# Patient Record
Sex: Female | Born: 1945 | Race: White | Hispanic: No | Marital: Married | Smoking: Former smoker
Health system: Southern US, Community
[De-identification: ages and names within clinical notes are randomized; demographics above are authoritative.]

## PROBLEM LIST (undated history)

## (undated) DIAGNOSIS — I10 Essential (primary) hypertension: Secondary | ICD-10-CM

## (undated) DIAGNOSIS — G459 Transient cerebral ischemic attack, unspecified: Secondary | ICD-10-CM

## (undated) DIAGNOSIS — E039 Hypothyroidism, unspecified: Secondary | ICD-10-CM

## (undated) DIAGNOSIS — I9789 Other postprocedural complications and disorders of the circulatory system, not elsewhere classified: Secondary | ICD-10-CM

## (undated) DIAGNOSIS — Z87442 Personal history of urinary calculi: Secondary | ICD-10-CM

## (undated) DIAGNOSIS — I951 Orthostatic hypotension: Secondary | ICD-10-CM

## (undated) DIAGNOSIS — E119 Type 2 diabetes mellitus without complications: Secondary | ICD-10-CM

## (undated) DIAGNOSIS — I4891 Unspecified atrial fibrillation: Secondary | ICD-10-CM

## (undated) DIAGNOSIS — I214 Non-ST elevation (NSTEMI) myocardial infarction: Secondary | ICD-10-CM

## (undated) DIAGNOSIS — I5032 Chronic diastolic (congestive) heart failure: Secondary | ICD-10-CM

## (undated) DIAGNOSIS — I2510 Atherosclerotic heart disease of native coronary artery without angina pectoris: Secondary | ICD-10-CM

## (undated) DIAGNOSIS — E1140 Type 2 diabetes mellitus with diabetic neuropathy, unspecified: Secondary | ICD-10-CM

## (undated) HISTORY — DX: Orthostatic hypotension: I95.1

## (undated) HISTORY — DX: Type 2 diabetes mellitus without complications: E11.9

## (undated) HISTORY — DX: Unspecified atrial fibrillation: I48.91

## (undated) HISTORY — DX: Chronic diastolic (congestive) heart failure: I50.32

## (undated) HISTORY — PX: ABDOMINAL HYSTERECTOMY: SHX81

## (undated) HISTORY — PX: LAPAROSCOPIC GASTRIC BANDING: SHX1100

## (undated) HISTORY — DX: Unspecified atrial fibrillation: I97.89

## (undated) HISTORY — DX: Type 2 diabetes mellitus with diabetic neuropathy, unspecified: E11.40

## (undated) HISTORY — DX: Atherosclerotic heart disease of native coronary artery without angina pectoris: I25.10

---

## 2007-01-01 ENCOUNTER — Ambulatory Visit (HOSPITAL_COMMUNITY): Admission: RE | Admit: 2007-01-01 | Discharge: 2007-01-01 | Payer: Self-pay | Admitting: Surgery

## 2007-01-05 ENCOUNTER — Ambulatory Visit (HOSPITAL_COMMUNITY): Admission: RE | Admit: 2007-01-05 | Discharge: 2007-01-05 | Payer: Self-pay | Admitting: Surgery

## 2007-01-12 ENCOUNTER — Encounter: Admission: RE | Admit: 2007-01-12 | Discharge: 2007-01-12 | Payer: Self-pay | Admitting: Surgery

## 2007-09-01 ENCOUNTER — Encounter: Admission: RE | Admit: 2007-09-01 | Discharge: 2007-09-01 | Payer: Self-pay | Admitting: Surgery

## 2007-09-14 ENCOUNTER — Ambulatory Visit (HOSPITAL_COMMUNITY): Admission: RE | Admit: 2007-09-14 | Discharge: 2007-09-15 | Payer: Self-pay | Admitting: Surgery

## 2010-08-14 NOTE — Op Note (Signed)
NAME:  Theresa Ray, Theresa Ray                 ACCOUNT NO.:  [id number]   MEDICAL RECORD NO.:  [id number]          PATIENT TYPE:  AMB   LOCATION:  DAY                          FACILITY:  [facility]   PHYSICIAN:  Sandria Bales. Ezzard Standing, M.D.  DATE OF BIRTH:  [birth year]   DATE OF PROCEDURE:  09/14/2007  DATE OF DISCHARGE:                               OPERATIVE REPORT   Date of Surgery ??   PREOPERATIVE DIAGNOSIS:  Morbid obesity, with a weight of 268, a body  mass index of 47.6.   POSTOPERATIVE DIAGNOSIS:  Morbid obesity, weight of 268, a body mass  index of 47.6.   PROCEDURE:  Lap band placement, with AP standard band.   SURGEON:  Sandria Bales. Ezzard Standing, M.D.   FIRST ASSISTANT:  Alfonse Ras, M.D.   ANESTHESIA:  General endotracheal.   ESTIMATED BLOOD LOSS:  Minimal.   PROCEDURE:  Theresa Ray is a 66 year old white female who is a patient Dr.  Roney Marion, who has been through our preop bariatric program.  She had  been interested in a lap band, and it was discussed with her the  indications and potential complication of lap band placement.   Potential complications include but are not limited bleeding, infection,  bowel injury, slippage and erosion of the band, and long-term  nutritional consequences.   OPERATIVE NOTE:  The patient was placed in the supine position and given  a general endotracheal anesthetic.  Her abdomen was prepped with Techni-  Care and sterilely draped.   A time-out was held, identifying the patient and procedure.  She was  given 1 g of Ancef preoperatively.  Her abdomen was accessed using a 10  mm/11 mm OptiVu in the left upper quadrant of the abdomen.  After  introducing a trocar into the abdomen, a 10 mm, 0-degree laparoscope was  inserted and the abdomen explored.  The patient had adhesions down  towards her pelvis of omentum.  Her bowel I really could not see well  because it was covered with omentum.  She is more of an apple than a  pear.  Her right and left lobes of  the liver showed fatty infiltration,  but no specific mass or nodule.  Her stomach that I could see was  unremarkable.   I then placed five additional trocars, a 5 mm subxiphoid trocar for the  liver retractor, a 15 mm right subcostal trocar,  11 mm right paramedian  trocar, 11 mm left paramedian trocar, and a 5 mm left lateral trocar.   I first dissected a section along the left side of the gastroesophageal  junction in the angle of his His.  I opened this and used a finger  dissector to go along the left crus.   I then went along the gastrohepatic ligament, identified the right crus,  found the fat decussating across the crus, opened this, made a small  window, and passed the finger dissector behind the proximal stomach.   I then had introduced the AP standard band.  The AP standard band was  then passed around behind the gastroesophageal junction  using a finger  dissector.  A sizer was placed down the esophagus and the band closed  over the sizer, which showed it was not too tight, and then the sizer  removed.   I then imbricated the stomach over the band up to the upper gastric  pouch.  I used 3 sutures of Ethibond suture using a Tie-Knot device to  secure the sutures.  The middle suture was somewhat loose.  The other  two looked good.  She also had a little bit of a hematoma from her  middle suture.   After the band had been sutured in place, photos were taken.  These were  put in the chart.  This showed good position of the band, with no  further bleeding.  The trocars were then removed in turn.  The Silastic  tubing was attached to the reservoir device, and the reservoir was sewn  in place in the right upper quadrant using interrupted 0 Prolene sutures  in four corners.   The wounds were then irrigated.  I used about 48 mL of 1% Xylocaine as a  local anesthetic.  The subcutaneous tissue was closed with a 2-0 Vicryl  suture, and the skin site closed with a 5-0 Monocryl,  painted with  tincture of Benzoin, and Steri-Stripped.   The patient tolerated the procedure well and was transported to the  recovery room in good condition.      Sandria Bales. Ezzard Standing, M.D.  Electronically Signed     DHN/MEDQ  D:  09/14/2007  T:  09/14/2007  Job:  [id number]   cc:   Roney Marion, M.D.

## 2010-12-27 LAB — CBC
Hemoglobin: 13.2
MCHC: 34
WBC: 8.5

## 2010-12-27 LAB — DIFFERENTIAL
Eosinophils Absolute: 0.1
Eosinophils Relative: 2
Monocytes Absolute: 0.6
Monocytes Relative: 8

## 2010-12-27 LAB — BASIC METABOLIC PANEL
CO2: 27
Creatinine, Ser: 0.86
GFR calc Af Amer: >60
GFR calc non Af Amer: >60
Sodium: 139

## 2010-12-27 LAB — PREGNANCY, URINE: Preg Test, Ur: NEGATIVE

## 2010-12-27 LAB — HEMOGLOBIN AND HEMATOCRIT, BLOOD: HCT: 40

## 2011-02-25 ENCOUNTER — Telehealth (INDEPENDENT_AMBULATORY_CARE_PROVIDER_SITE_OTHER): Payer: Self-pay | Admitting: Surgery

## 2011-02-25 NOTE — Telephone Encounter (Signed)
02/25/11 mailed recall to patient for bariatric surgery follow-up. Adv pt to call [facility] to schedule an appt...cef °

## 2012-04-06 ENCOUNTER — Telehealth (INDEPENDENT_AMBULATORY_CARE_PROVIDER_SITE_OTHER): Payer: Self-pay | Admitting: Surgery

## 2012-04-06 NOTE — Telephone Encounter (Signed)
04/06/12 mailed bariatric surgery recall letter to patient. Advised patient to call [phone] to make a bariatric follow-up appointment. (lss) ° °

## 2015-09-12 ENCOUNTER — Other Ambulatory Visit: Payer: Self-pay | Admitting: Family Medicine

## 2016-01-03 ENCOUNTER — Encounter (HOSPITAL_COMMUNITY): Payer: Self-pay

## 2016-04-19 DIAGNOSIS — J40 Bronchitis, not specified as acute or chronic: Secondary | ICD-10-CM | POA: Diagnosis not present

## 2016-05-03 DIAGNOSIS — N390 Urinary tract infection, site not specified: Secondary | ICD-10-CM | POA: Diagnosis not present

## 2016-05-10 DIAGNOSIS — T162XXA Foreign body in left ear, initial encounter: Secondary | ICD-10-CM | POA: Diagnosis not present

## 2016-05-10 DIAGNOSIS — J302 Other seasonal allergic rhinitis: Secondary | ICD-10-CM | POA: Diagnosis not present

## 2016-05-10 DIAGNOSIS — Z8709 Personal history of other diseases of the respiratory system: Secondary | ICD-10-CM | POA: Diagnosis not present

## 2016-05-10 DIAGNOSIS — J40 Bronchitis, not specified as acute or chronic: Secondary | ICD-10-CM | POA: Diagnosis not present

## 2016-05-28 DIAGNOSIS — M549 Dorsalgia, unspecified: Secondary | ICD-10-CM | POA: Diagnosis not present

## 2016-05-28 DIAGNOSIS — M47896 Other spondylosis, lumbar region: Secondary | ICD-10-CM | POA: Diagnosis not present

## 2016-05-28 DIAGNOSIS — M5416 Radiculopathy, lumbar region: Secondary | ICD-10-CM | POA: Diagnosis not present

## 2016-05-28 DIAGNOSIS — M545 Low back pain: Secondary | ICD-10-CM | POA: Diagnosis not present

## 2016-05-28 DIAGNOSIS — M5126 Other intervertebral disc displacement, lumbar region: Secondary | ICD-10-CM | POA: Diagnosis not present

## 2016-06-04 DIAGNOSIS — M47816 Spondylosis without myelopathy or radiculopathy, lumbar region: Secondary | ICD-10-CM | POA: Diagnosis not present

## 2016-06-04 DIAGNOSIS — M48061 Spinal stenosis, lumbar region without neurogenic claudication: Secondary | ICD-10-CM | POA: Diagnosis not present

## 2016-06-04 DIAGNOSIS — M5126 Other intervertebral disc displacement, lumbar region: Secondary | ICD-10-CM | POA: Diagnosis not present

## 2016-06-21 DIAGNOSIS — Z6839 Body mass index (BMI) 39.0-39.9, adult: Secondary | ICD-10-CM | POA: Diagnosis not present

## 2016-06-21 DIAGNOSIS — R32 Unspecified urinary incontinence: Secondary | ICD-10-CM | POA: Diagnosis not present

## 2016-06-21 DIAGNOSIS — N390 Urinary tract infection, site not specified: Secondary | ICD-10-CM | POA: Diagnosis not present

## 2016-06-24 DIAGNOSIS — N3000 Acute cystitis without hematuria: Secondary | ICD-10-CM | POA: Diagnosis not present

## 2016-06-24 DIAGNOSIS — N390 Urinary tract infection, site not specified: Secondary | ICD-10-CM | POA: Diagnosis not present

## 2016-06-26 DIAGNOSIS — Z6838 Body mass index (BMI) 38.0-38.9, adult: Secondary | ICD-10-CM | POA: Diagnosis not present

## 2016-06-26 DIAGNOSIS — M545 Low back pain: Secondary | ICD-10-CM | POA: Diagnosis not present

## 2016-06-26 DIAGNOSIS — M5126 Other intervertebral disc displacement, lumbar region: Secondary | ICD-10-CM | POA: Diagnosis not present

## 2016-06-27 DIAGNOSIS — N318 Other neuromuscular dysfunction of bladder: Secondary | ICD-10-CM | POA: Diagnosis not present

## 2016-06-27 DIAGNOSIS — N3000 Acute cystitis without hematuria: Secondary | ICD-10-CM | POA: Diagnosis not present

## 2016-06-27 DIAGNOSIS — N393 Stress incontinence (female) (male): Secondary | ICD-10-CM | POA: Diagnosis not present

## 2016-07-01 DIAGNOSIS — N393 Stress incontinence (female) (male): Secondary | ICD-10-CM | POA: Diagnosis not present

## 2016-07-08 DIAGNOSIS — M47816 Spondylosis without myelopathy or radiculopathy, lumbar region: Secondary | ICD-10-CM | POA: Diagnosis not present

## 2016-07-15 DIAGNOSIS — N393 Stress incontinence (female) (male): Secondary | ICD-10-CM | POA: Diagnosis not present

## 2016-07-15 DIAGNOSIS — N3000 Acute cystitis without hematuria: Secondary | ICD-10-CM | POA: Diagnosis not present

## 2016-07-16 DIAGNOSIS — M47816 Spondylosis without myelopathy or radiculopathy, lumbar region: Secondary | ICD-10-CM | POA: Diagnosis not present

## 2016-08-13 DIAGNOSIS — N318 Other neuromuscular dysfunction of bladder: Secondary | ICD-10-CM | POA: Diagnosis not present

## 2016-08-13 DIAGNOSIS — N393 Stress incontinence (female) (male): Secondary | ICD-10-CM | POA: Diagnosis not present

## 2016-08-13 DIAGNOSIS — N3020 Other chronic cystitis without hematuria: Secondary | ICD-10-CM | POA: Diagnosis not present

## 2016-09-03 DIAGNOSIS — L02415 Cutaneous abscess of right lower limb: Secondary | ICD-10-CM | POA: Diagnosis not present

## 2016-09-04 DIAGNOSIS — M47816 Spondylosis without myelopathy or radiculopathy, lumbar region: Secondary | ICD-10-CM | POA: Diagnosis not present

## 2016-09-04 DIAGNOSIS — M47817 Spondylosis without myelopathy or radiculopathy, lumbosacral region: Secondary | ICD-10-CM | POA: Diagnosis not present

## 2016-09-16 DIAGNOSIS — N393 Stress incontinence (female) (male): Secondary | ICD-10-CM | POA: Diagnosis not present

## 2016-09-16 DIAGNOSIS — N3020 Other chronic cystitis without hematuria: Secondary | ICD-10-CM | POA: Diagnosis not present

## 2016-10-03 DIAGNOSIS — M5126 Other intervertebral disc displacement, lumbar region: Secondary | ICD-10-CM | POA: Diagnosis not present

## 2016-10-03 DIAGNOSIS — M545 Low back pain: Secondary | ICD-10-CM | POA: Diagnosis not present

## 2016-10-03 DIAGNOSIS — M47816 Spondylosis without myelopathy or radiculopathy, lumbar region: Secondary | ICD-10-CM | POA: Diagnosis not present

## 2016-11-05 DIAGNOSIS — Z131 Encounter for screening for diabetes mellitus: Secondary | ICD-10-CM | POA: Diagnosis not present

## 2016-11-05 DIAGNOSIS — Z1389 Encounter for screening for other disorder: Secondary | ICD-10-CM | POA: Diagnosis not present

## 2016-11-05 DIAGNOSIS — E785 Hyperlipidemia, unspecified: Secondary | ICD-10-CM | POA: Diagnosis not present

## 2016-11-05 DIAGNOSIS — E039 Hypothyroidism, unspecified: Secondary | ICD-10-CM | POA: Diagnosis not present

## 2016-11-05 DIAGNOSIS — Z79899 Other long term (current) drug therapy: Secondary | ICD-10-CM | POA: Diagnosis not present

## 2016-11-05 DIAGNOSIS — Z0000 Encounter for general adult medical examination without abnormal findings: Secondary | ICD-10-CM | POA: Diagnosis not present

## 2016-11-05 DIAGNOSIS — Z6837 Body mass index (BMI) 37.0-37.9, adult: Secondary | ICD-10-CM | POA: Diagnosis not present

## 2017-02-14 DIAGNOSIS — L259 Unspecified contact dermatitis, unspecified cause: Secondary | ICD-10-CM | POA: Diagnosis not present

## 2017-02-15 DIAGNOSIS — E785 Hyperlipidemia, unspecified: Secondary | ICD-10-CM | POA: Diagnosis not present

## 2017-02-15 DIAGNOSIS — E1165 Type 2 diabetes mellitus with hyperglycemia: Secondary | ICD-10-CM | POA: Diagnosis not present

## 2017-02-15 DIAGNOSIS — R9431 Abnormal electrocardiogram [ECG] [EKG]: Secondary | ICD-10-CM | POA: Diagnosis not present

## 2017-02-15 DIAGNOSIS — I10 Essential (primary) hypertension: Secondary | ICD-10-CM | POA: Diagnosis not present

## 2017-02-15 DIAGNOSIS — M542 Cervicalgia: Secondary | ICD-10-CM | POA: Diagnosis not present

## 2017-02-15 DIAGNOSIS — E039 Hypothyroidism, unspecified: Secondary | ICD-10-CM | POA: Diagnosis not present

## 2017-02-15 DIAGNOSIS — R079 Chest pain, unspecified: Secondary | ICD-10-CM | POA: Diagnosis not present

## 2017-02-15 DIAGNOSIS — R200 Anesthesia of skin: Secondary | ICD-10-CM | POA: Diagnosis not present

## 2017-02-15 DIAGNOSIS — I214 Non-ST elevation (NSTEMI) myocardial infarction: Secondary | ICD-10-CM | POA: Diagnosis not present

## 2017-02-15 DIAGNOSIS — Z8673 Personal history of transient ischemic attack (TIA), and cerebral infarction without residual deficits: Secondary | ICD-10-CM | POA: Diagnosis not present

## 2017-02-15 DIAGNOSIS — Z7984 Long term (current) use of oral hypoglycemic drugs: Secondary | ICD-10-CM | POA: Diagnosis not present

## 2017-02-15 DIAGNOSIS — R7989 Other specified abnormal findings of blood chemistry: Secondary | ICD-10-CM | POA: Diagnosis not present

## 2017-02-15 DIAGNOSIS — R937 Abnormal findings on diagnostic imaging of other parts of musculoskeletal system: Secondary | ICD-10-CM | POA: Diagnosis not present

## 2017-02-15 DIAGNOSIS — R0602 Shortness of breath: Secondary | ICD-10-CM | POA: Diagnosis not present

## 2017-02-16 ENCOUNTER — Inpatient Hospital Stay (HOSPITAL_COMMUNITY)
Admission: AD | Admit: 2017-02-16 | Discharge: 2017-03-03 | DRG: 233 | Disposition: A | Discharge status: Home Health | Payer: Medicare HMO | Source: Other Acute Inpatient Hospital | Attending: Cardiothoracic Surgery | Admitting: Cardiothoracic Surgery

## 2017-02-16 ENCOUNTER — Telehealth: Payer: Self-pay | Admitting: Cardiology

## 2017-02-16 ENCOUNTER — Inpatient Hospital Stay (HOSPITAL_COMMUNITY): Payer: Medicare HMO

## 2017-02-16 DIAGNOSIS — Z9884 Bariatric surgery status: Secondary | ICD-10-CM | POA: Diagnosis not present

## 2017-02-16 DIAGNOSIS — Z79899 Other long term (current) drug therapy: Secondary | ICD-10-CM | POA: Diagnosis not present

## 2017-02-16 DIAGNOSIS — E781 Pure hyperglyceridemia: Secondary | ICD-10-CM | POA: Diagnosis present

## 2017-02-16 DIAGNOSIS — Z7984 Long term (current) use of oral hypoglycemic drugs: Secondary | ICD-10-CM

## 2017-02-16 DIAGNOSIS — D72829 Elevated white blood cell count, unspecified: Secondary | ICD-10-CM | POA: Diagnosis not present

## 2017-02-16 DIAGNOSIS — J9811 Atelectasis: Secondary | ICD-10-CM | POA: Diagnosis not present

## 2017-02-16 DIAGNOSIS — Z91013 Allergy to seafood: Secondary | ICD-10-CM | POA: Diagnosis not present

## 2017-02-16 DIAGNOSIS — I2510 Atherosclerotic heart disease of native coronary artery without angina pectoris: Secondary | ICD-10-CM | POA: Diagnosis present

## 2017-02-16 DIAGNOSIS — Z8673 Personal history of transient ischemic attack (TIA), and cerebral infarction without residual deficits: Secondary | ICD-10-CM

## 2017-02-16 DIAGNOSIS — Z6841 Body Mass Index (BMI) 40.0 and over, adult: Secondary | ICD-10-CM | POA: Diagnosis not present

## 2017-02-16 DIAGNOSIS — Z4651 Encounter for fitting and adjustment of gastric lap band: Secondary | ICD-10-CM | POA: Diagnosis not present

## 2017-02-16 DIAGNOSIS — E039 Hypothyroidism, unspecified: Secondary | ICD-10-CM | POA: Diagnosis present

## 2017-02-16 DIAGNOSIS — I25110 Atherosclerotic heart disease of native coronary artery with unstable angina pectoris: Secondary | ICD-10-CM | POA: Diagnosis not present

## 2017-02-16 DIAGNOSIS — R937 Abnormal findings on diagnostic imaging of other parts of musculoskeletal system: Secondary | ICD-10-CM | POA: Diagnosis not present

## 2017-02-16 DIAGNOSIS — E119 Type 2 diabetes mellitus without complications: Secondary | ICD-10-CM | POA: Diagnosis not present

## 2017-02-16 DIAGNOSIS — Z79890 Hormone replacement therapy (postmenopausal): Secondary | ICD-10-CM | POA: Diagnosis not present

## 2017-02-16 DIAGNOSIS — Z7982 Long term (current) use of aspirin: Secondary | ICD-10-CM

## 2017-02-16 DIAGNOSIS — M1990 Unspecified osteoarthritis, unspecified site: Secondary | ICD-10-CM | POA: Diagnosis present

## 2017-02-16 DIAGNOSIS — E1165 Type 2 diabetes mellitus with hyperglycemia: Secondary | ICD-10-CM | POA: Diagnosis not present

## 2017-02-16 DIAGNOSIS — Z87891 Personal history of nicotine dependence: Secondary | ICD-10-CM | POA: Diagnosis not present

## 2017-02-16 DIAGNOSIS — E785 Hyperlipidemia, unspecified: Secondary | ICD-10-CM | POA: Diagnosis not present

## 2017-02-16 DIAGNOSIS — Z951 Presence of aortocoronary bypass graft: Secondary | ICD-10-CM

## 2017-02-16 DIAGNOSIS — I081 Rheumatic disorders of both mitral and tricuspid valves: Secondary | ICD-10-CM | POA: Diagnosis not present

## 2017-02-16 DIAGNOSIS — I110 Hypertensive heart disease with heart failure: Secondary | ICD-10-CM | POA: Diagnosis present

## 2017-02-16 DIAGNOSIS — J90 Pleural effusion, not elsewhere classified: Secondary | ICD-10-CM | POA: Diagnosis not present

## 2017-02-16 DIAGNOSIS — R200 Anesthesia of skin: Secondary | ICD-10-CM | POA: Diagnosis not present

## 2017-02-16 DIAGNOSIS — G4733 Obstructive sleep apnea (adult) (pediatric): Secondary | ICD-10-CM | POA: Diagnosis present

## 2017-02-16 DIAGNOSIS — E876 Hypokalemia: Secondary | ICD-10-CM | POA: Diagnosis not present

## 2017-02-16 DIAGNOSIS — E1140 Type 2 diabetes mellitus with diabetic neuropathy, unspecified: Secondary | ICD-10-CM

## 2017-02-16 DIAGNOSIS — Z9111 Patient's noncompliance with dietary regimen: Secondary | ICD-10-CM | POA: Diagnosis not present

## 2017-02-16 DIAGNOSIS — Z01810 Encounter for preprocedural cardiovascular examination: Secondary | ICD-10-CM | POA: Diagnosis not present

## 2017-02-16 DIAGNOSIS — D62 Acute posthemorrhagic anemia: Secondary | ICD-10-CM | POA: Diagnosis not present

## 2017-02-16 DIAGNOSIS — Z4682 Encounter for fitting and adjustment of non-vascular catheter: Secondary | ICD-10-CM | POA: Diagnosis not present

## 2017-02-16 DIAGNOSIS — R9431 Abnormal electrocardiogram [ECG] [EKG]: Secondary | ICD-10-CM | POA: Diagnosis not present

## 2017-02-16 DIAGNOSIS — R7989 Other specified abnormal findings of blood chemistry: Secondary | ICD-10-CM | POA: Diagnosis not present

## 2017-02-16 DIAGNOSIS — I5030 Unspecified diastolic (congestive) heart failure: Secondary | ICD-10-CM | POA: Diagnosis not present

## 2017-02-16 DIAGNOSIS — Z9689 Presence of other specified functional implants: Secondary | ICD-10-CM

## 2017-02-16 DIAGNOSIS — I5031 Acute diastolic (congestive) heart failure: Secondary | ICD-10-CM | POA: Diagnosis not present

## 2017-02-16 DIAGNOSIS — K5900 Constipation, unspecified: Secondary | ICD-10-CM | POA: Diagnosis not present

## 2017-02-16 DIAGNOSIS — I214 Non-ST elevation (NSTEMI) myocardial infarction: Principal | ICD-10-CM | POA: Diagnosis present

## 2017-02-16 DIAGNOSIS — I480 Paroxysmal atrial fibrillation: Secondary | ICD-10-CM | POA: Diagnosis not present

## 2017-02-16 DIAGNOSIS — Z09 Encounter for follow-up examination after completed treatment for conditions other than malignant neoplasm: Secondary | ICD-10-CM

## 2017-02-16 DIAGNOSIS — L8990 Pressure ulcer of unspecified site, unspecified stage: Secondary | ICD-10-CM

## 2017-02-16 DIAGNOSIS — J939 Pneumothorax, unspecified: Secondary | ICD-10-CM

## 2017-02-16 DIAGNOSIS — I10 Essential (primary) hypertension: Secondary | ICD-10-CM

## 2017-02-16 DIAGNOSIS — R079 Chest pain, unspecified: Secondary | ICD-10-CM | POA: Diagnosis not present

## 2017-02-16 DIAGNOSIS — R918 Other nonspecific abnormal finding of lung field: Secondary | ICD-10-CM | POA: Diagnosis not present

## 2017-02-16 HISTORY — DX: Essential (primary) hypertension: I10

## 2017-02-16 HISTORY — DX: Personal history of urinary calculi: Z87.442

## 2017-02-16 HISTORY — DX: Transient cerebral ischemic attack, unspecified: G45.9

## 2017-02-16 HISTORY — DX: Non-ST elevation (NSTEMI) myocardial infarction: I21.4

## 2017-02-16 HISTORY — DX: Hypothyroidism, unspecified: E03.9

## 2017-02-16 LAB — BASIC METABOLIC PANEL
ANION GAP: 13 (ref 5–15)
BUN: 13 mg/dL (ref 6–20)
CO2: 22 mmol/L (ref 22–32)
Calcium: 8.5 mg/dL — ABNORMAL LOW (ref 8.9–10.3)
Chloride: 101 mmol/L (ref 101–111)
Creatinine, Ser: 0.91 mg/dL (ref 0.44–1.00)
GFR calc Af Amer: >60 mL/min (ref >60)
GFR calc non Af Amer: >60 mL/min (ref >60)
GLUCOSE: 181 mg/dL — AB (ref 65–99)
POTASSIUM: 3.7 mmol/L (ref 3.5–5.1)
Sodium: 136 mmol/L (ref 135–145)

## 2017-02-16 LAB — CBC WITH DIFFERENTIAL/PLATELET
BASOS ABS: 0.1 10*3/uL (ref 0.0–0.1)
BASOS PCT: 1 %
Eosinophils Absolute: 0.2 10*3/uL (ref 0.0–0.7)
Eosinophils Relative: 2 %
HEMATOCRIT: 35.8 % — AB (ref 36.0–46.0)
HEMOGLOBIN: 12.2 g/dL (ref 12.0–15.0)
Lymphocytes Relative: 28 %
Lymphs Abs: 3.5 10*3/uL (ref 0.7–4.0)
MCH: 30.4 pg (ref 26.0–34.0)
MCHC: 34.1 g/dL (ref 30.0–36.0)
MCV: 89.3 fL (ref 78.0–100.0)
Monocytes Absolute: 1 10*3/uL (ref 0.1–1.0)
Monocytes Relative: 8 %
NEUTROS ABS: 7.9 10*3/uL — AB (ref 1.7–7.7)
NEUTROS PCT: 61 %
Platelets: 356 10*3/uL (ref 150–400)
RBC: 4.01 MIL/uL (ref 3.87–5.11)
RDW: 12.3 % (ref 11.5–15.5)
WBC: 12.7 10*3/uL — ABNORMAL HIGH (ref 4.0–10.5)

## 2017-02-16 LAB — HEPARIN LEVEL (UNFRACTIONATED)

## 2017-02-16 LAB — GLUCOSE, CAPILLARY
Glucose-Capillary: 181 mg/dL — ABNORMAL HIGH (ref 65–99)
Glucose-Capillary: 288 mg/dL — ABNORMAL HIGH (ref 65–99)

## 2017-02-16 LAB — HEMOGLOBIN A1C
HEMOGLOBIN A1C: 8.8 % — AB (ref 4.8–5.6)
MEAN PLASMA GLUCOSE: 205.86 mg/dL

## 2017-02-16 LAB — PROTIME-INR
INR: 0.98
Prothrombin Time: 12.9 seconds (ref 11.4–15.2)

## 2017-02-16 LAB — TROPONIN I: Troponin I: 1.19 ng/mL (ref <0.03)

## 2017-02-16 LAB — APTT: APTT: 39 s — AB (ref 24–36)

## 2017-02-16 LAB — MAGNESIUM: Magnesium: 1.7 mg/dL (ref 1.7–2.4)

## 2017-02-16 LAB — MRSA PCR SCREENING: MRSA by PCR: NEGATIVE

## 2017-02-16 MED ORDER — CARVEDILOL 25 MG PO TABS
25.0000 mg | ORAL_TABLET | Freq: Two times a day (BID) | ORAL | Status: DC
  Administered 2017-02-16 – 2017-02-23 (×15): 25 mg via ORAL
  Filled 2017-02-16 (×15): qty 1

## 2017-02-16 MED ORDER — NON FORMULARY: 3.0000 mg | Freq: Every evening | Status: DC | PRN

## 2017-02-16 MED ORDER — METOPROLOL TARTRATE 12.5 MG HALF TABLET: 12.5000 mg | ORAL_TABLET | Freq: Two times a day (BID) | ORAL | Status: DC

## 2017-02-16 MED ORDER — GABAPENTIN 100 MG PO CAPS
100.0000 mg | ORAL_CAPSULE | Freq: Three times a day (TID) | ORAL | Status: DC
  Administered 2017-02-16 – 2017-03-03 (×42): 100 mg via ORAL
  Filled 2017-02-16 (×42): qty 1

## 2017-02-16 MED ORDER — MELATONIN 3 MG PO TABS
3.0000 mg | ORAL_TABLET | Freq: Every evening | ORAL | Status: DC | PRN
  Administered 2017-02-16 – 2017-02-28 (×6): 3 mg via ORAL
  Filled 2017-02-16 (×10): qty 1

## 2017-02-16 MED ORDER — HEPARIN (PORCINE) IN NACL 100-0.45 UNIT/ML-% IJ SOLN
1750.0000 [IU]/h | INTRAMUSCULAR | Status: DC
  Administered 2017-02-17: 1450 [IU]/h via INTRAVENOUS
  Administered 2017-02-17: 1750 [IU]/h via INTRAVENOUS
  Filled 2017-02-16 (×2): qty 250

## 2017-02-16 MED ORDER — INSULIN ASPART 100 UNIT/ML ~~LOC~~ SOLN
0.0000 [IU] | Freq: Three times a day (TID) | SUBCUTANEOUS | Status: DC
  Administered 2017-02-17: 5 [IU] via SUBCUTANEOUS
  Administered 2017-02-17: 3 [IU] via SUBCUTANEOUS
  Administered 2017-02-17: 5 [IU] via SUBCUTANEOUS
  Administered 2017-02-18 (×2): 3 [IU] via SUBCUTANEOUS
  Administered 2017-02-18: 7 [IU] via SUBCUTANEOUS
  Administered 2017-02-19 (×2): 5 [IU] via SUBCUTANEOUS
  Administered 2017-02-19: 3 [IU] via SUBCUTANEOUS
  Administered 2017-02-20 (×2): 5 [IU] via SUBCUTANEOUS
  Administered 2017-02-20 – 2017-02-21 (×3): 3 [IU] via SUBCUTANEOUS
  Administered 2017-02-21 – 2017-02-22 (×2): 2 [IU] via SUBCUTANEOUS
  Administered 2017-02-22: 3 [IU] via SUBCUTANEOUS
  Administered 2017-02-22: 5 [IU] via SUBCUTANEOUS
  Administered 2017-02-23 (×3): 3 [IU] via SUBCUTANEOUS

## 2017-02-16 MED ORDER — NITROGLYCERIN IN D5W 200-5 MCG/ML-% IV SOLN: 0.0000 ug/min | INTRAVENOUS | Status: DC

## 2017-02-16 MED ORDER — ASPIRIN EC 81 MG PO TBEC
81.0000 mg | DELAYED_RELEASE_TABLET | Freq: Every day | ORAL | Status: DC
  Administered 2017-02-17 – 2017-02-23 (×7): 81 mg via ORAL
  Filled 2017-02-16 (×7): qty 1

## 2017-02-16 MED ORDER — LEVOTHYROXINE SODIUM 75 MCG PO TABS
150.0000 ug | ORAL_TABLET | Freq: Every day | ORAL | Status: DC
  Administered 2017-02-17 – 2017-03-03 (×14): 150 ug via ORAL
  Filled 2017-02-16 (×3): qty 2
  Filled 2017-02-16: qty 6
  Filled 2017-02-16 (×6): qty 2
  Filled 2017-02-16: qty 6
  Filled 2017-02-16 (×4): qty 2

## 2017-02-16 MED ORDER — CLONIDINE HCL 0.2 MG PO TABS
0.2000 mg | ORAL_TABLET | Freq: Every day | ORAL | Status: DC
  Administered 2017-02-16 – 2017-02-23 (×8): 0.2 mg via ORAL
  Filled 2017-02-16 (×8): qty 1

## 2017-02-16 MED ORDER — AMLODIPINE BESYLATE 10 MG PO TABS
10.0000 mg | ORAL_TABLET | Freq: Every day | ORAL | Status: DC
  Administered 2017-02-17 – 2017-02-23 (×7): 10 mg via ORAL
  Filled 2017-02-16 (×7): qty 1

## 2017-02-16 MED ORDER — ONDANSETRON HCL 4 MG/2ML IJ SOLN: 4.0000 mg | Freq: Four times a day (QID) | INTRAMUSCULAR | Status: DC | PRN

## 2017-02-16 MED ORDER — NITROGLYCERIN 0.4 MG SL SUBL: 0.4000 mg | SUBLINGUAL_TABLET | SUBLINGUAL | Status: DC | PRN

## 2017-02-16 MED ORDER — INSULIN ASPART 100 UNIT/ML ~~LOC~~ SOLN
0.0000 [IU] | Freq: Every day | SUBCUTANEOUS | Status: DC
  Administered 2017-02-16 – 2017-02-17 (×2): 3 [IU] via SUBCUTANEOUS
  Administered 2017-02-18 – 2017-02-19 (×2): 2 [IU] via SUBCUTANEOUS
  Administered 2017-02-20: 3 [IU] via SUBCUTANEOUS
  Administered 2017-02-21: 2 [IU] via SUBCUTANEOUS
  Administered 2017-02-22: 3 [IU] via SUBCUTANEOUS
  Administered 2017-02-23: 2 [IU] via SUBCUTANEOUS

## 2017-02-16 MED ORDER — ACETAMINOPHEN 325 MG PO TABS
650.0000 mg | ORAL_TABLET | ORAL | Status: DC | PRN
  Administered 2017-02-16 – 2017-02-22 (×10): 650 mg via ORAL
  Filled 2017-02-16 (×10): qty 2

## 2017-02-16 MED ORDER — ATORVASTATIN CALCIUM 80 MG PO TABS
80.0000 mg | ORAL_TABLET | Freq: Every day | ORAL | Status: DC
  Administered 2017-02-16 – 2017-03-02 (×14): 80 mg via ORAL
  Filled 2017-02-16 (×14): qty 1

## 2017-02-16 NOTE — Progress Notes (Signed)
ANTICOAGULATION CONSULT NOTE - Initial Consult  Pharmacy Consult for heparin Indication: chest pain/ACS  Allergies  Allergen Reactions  . Aggrenox [Aspirin-Dipyridamole Er] Other (See Comments)    Redness in eyes, takes aspirin 325 at home at baseline  . Shellfish Allergy Other (See Comments)    UNKNOWN REACTION. Allergy found on allergy test.    Patient Measurements: Height: 5' (152.4 cm) Weight: 214 lb (97.1 kg) IBW/kg (Calculated) : 45.5 Heparin Dosing Weight: 68.9kg  Vital Signs: Temp: 98.1 F (36.7 C) (11/18 1955) Temp Source: Oral (11/18 1700) BP: 141/76 (11/18 2010)  Labs: Recent Labs    02/16/17 1902  HGB 12.2  HCT 35.8*  PLT 356  APTT 39*  LABPROT 12.9  INR 0.98  HEPARINUNFRC <0.10*  CREATININE 0.91  TROPONINI 1.19*    Estimated Creatinine Clearance: 61.7 mL/min (by C-G formula based on SCr of 0.91 mg/dL).   Medical History: No past medical history on file.  Medications:  Infusions:  . heparin      Assessment: 59 yof presented to OSH with CP. Transferred here for possible cardiac cath. Hgb 12.6 and platelets are 357 per OSH. Initially started on 1000 units/hr but changed to 1300 units/hr at some point.   Heparin level this evening resulted as SUBtherapeutic (HL<0.1). It appears the heparin drip was started around 1 PM however it is unclear when the rate was changed to from 1000 units/hr to 1300 units/hr. It is likely that the patient has not been running at this new rate for a full 6 hours. Will increase the drip rate this evening but not as aggressively.    Goal of Therapy:  Heparin level 0.3-0.7 units/ml Monitor platelets by anticoagulation protocol: Yes   Plan:  1. Increase Heparin drip rate to 1450 units/hr (14.5 ml/hr) 2. Will continue to monitor for any signs/symptoms of bleeding and will follow up with heparin level in 6 hours   Thank you for allowing pharmacy to be a part of this patient's care.  Alycia Rossetti, PharmD,  BCPS Clinical Pharmacist Pager: [phone] If after 3:30p, please call main pharmacy at: [phone] 02/16/2017 9:18 PM

## 2017-02-16 NOTE — H&P (Signed)
CARDIOLOGY HISTORY & PHYSICAL   Referring Physician: ER at [facility] Primary Physician: Dr. Kennith Maes [facility]) Primary Cardiologist: NA Reason for Admission: NSTEMI   HPI: Ms. Governale is a 71 yo woman with PMH TIA, DM, arthritis, hypertension who presented to [facility] ER yesterday with chest pain. She was found to have a positive troponin at 0.65 per notes from Dr. Rhae Hammock and was transferred to [facility] for NSTEMI management.  The patient reports no prior history of heart disease. She notes "spells" over the last few months when she just didn't feel well. Not related to exertion, could happen at any time, resolved spontaneously. Yesterday she awoke from sleep with a tightness in her chest. It resolved spontaneously but then recurred at 11 AM and again at 4 PM. It was a tight pressure, in the center of her chest, with arm tingling as well. No associated nausea, vomiting, diaphoresis, shortness of breath. She presented to the [facility] ER yesterday afternoon around 5 PM, and she has not had any pain since. She was started on a heparin and nitroglycerin drip at [place].  She denies current OSA but does state she used CPAP previously, then was told she no longer needed it. Reports prior TIA but denies history of atrial arrhythmia. On full dose ASA at home, not on other blood thinner, no issues with bruising or bleeding. Reports that when she was given aggrenox after her TIA she had "redness in my eyes like pinkeye." Denies other allergies. Has chronic pedal edema, uses furosemide and wears compression stockings. Rest of ROS negative.  Home medications per patient list: Aspirin 325 mg Carvedilol 25 mg twice daily Clonidine 0.2 mg at night Furosemide 40 mg daily Celecoxib 100 mg daily Levothyroxine 150 mcg daily Metformin 1000 mg twice daily Glipizide 10 mg twice daily Atorvastatin 20 mg daily Amlodipine 10 mg daily januvia 50 mg daily Gabapentin 100 mg three times daily Centrum  vitamin Potassium gluconate 595 mg per tab, 3 tabs AM/3 tabs PM Krill oil Coenzyme Q10 Melatonin 10 mg nightly Vitamin D 2000 IU per tab, 3 tabs AM/3 tabs PM Extra strength tylenol PRN  Review of Systems:     Cardiac Review of Systems: {Y] = yes [ ]  = no  Chest Pain [ Y   ]  Resting SOB [ N ] Exertional SOB  [ N ]  Orthopnea [ N ]   Pedal Edema [  Y chronic ]    Palpitations [ N ] Syncope  [ N ]   Presyncope [ N  ]  General Review of Systems: [Y] = yes [  ]=no Constitional: recent weight change [  ]; anorexia [  ]; fatigue [  ]; nausea [  ]; night sweats [  ]; fever [  ]; or chills [  ];                                                                     Eyes : blurred vision [  ]; diplopia [   ]; vision changes [  ];  Amaurosis fugax[  ]; Resp: cough [  ];  wheezing[  ];  hemoptysis[  ];  PND [  ];  GI:  gallstones[  ], vomiting[  ];  dysphagia[  ];  melena[  ];  hematochezia [  ]; heartburn[  ];   GU: kidney stones [  ]; hematuria[  ];   dysuria [  ];  nocturia[  ]; incontinence [  ];             Skin: rash, swelling[  ];, hair loss[  ];  peripheral edema[ Y chronic ];  or itching[  ]; Musculosketetal: myalgias[  ];  joint swelling[  ];  joint erythema[  ];  joint pain[Y chronic  ];  back pain[  ];  Heme/Lymph: bruising[  ];  bleeding[  ];  anemia[  ];  Neuro: TIA[  ];  headaches[  ];  stroke[  ];  vertigo[  ];  seizures[  ];   paresthesias[  ];  difficulty walking[  ];  Psych:depression[  ]; anxiety[  ];  Endocrine: diabetes[  ];  thyroid dysfunction[  ];  Other:  No past medical history on file.  Medications Prior to Admission  Medication Sig Dispense Refill  . amLODipine (NORVASC) 10 MG tablet Take 10 mg daily by mouth.    Marland Kitchen atorvastatin (LIPITOR) 20 MG tablet Take 20 mg daily by mouth.    . carvedilol (COREG) 25 MG tablet Take 25 mg 2 (two) times daily by mouth.    . cloNIDine (CATAPRES) 0.2 MG tablet Take 0.2 mg daily by mouth.    . furosemide (LASIX) 40 MG tablet Take 40 mg  daily by mouth.    Marland Kitchen glipiZIDE (GLUCOTROL) 10 MG tablet Take 10 mg 2 (two) times daily by mouth.    . levothyroxine (SYNTHROID, LEVOTHROID) 175 MCG tablet Take 175 mcg daily before breakfast by mouth.    . metFORMIN (GLUCOPHAGE) 1000 MG tablet Take 1,000 mg 2 (two) times daily by mouth.    . methylPREDNISolone (MEDROL DOSEPAK) 4 MG TBPK tablet See admin instructions. Started on 02-14-17  0  . betamethasone valerate (VALISONE) 0.1 % cream Apply 1 application 2 (two) times daily topically.  0     . [START ON 02/17/2017] amLODipine  10 mg Oral Daily  . [START ON 02/17/2017] aspirin EC  81 mg Oral Daily  . atorvastatin  80 mg Oral q1800  . carvedilol  25 mg Oral BID WC  . cloNIDine  0.2 mg Oral QHS  . gabapentin  100 mg Oral TID  . insulin aspart  0-5 Units Subcutaneous QHS  . [START ON 02/17/2017] insulin aspart  0-9 Units Subcutaneous TID WC  . [START ON 02/17/2017] levothyroxine  150 mcg Oral QAC breakfast    Infusions: . heparin      Allergies  Allergen Reactions  . Aggrenox [Aspirin-Dipyridamole Er] Other (See Comments)    Redness in eyes, takes aspirin 325 at home at baseline  . Shellfish Allergy Other (See Comments)    UNKNOWN REACTION. Allergy found on allergy test.    Social History   Socioeconomic History  . Marital status: Single    Spouse name: Not on file  . Number of children: Not on file  . Years of education: Not on file  . Highest education level: Not on file  Social Needs  . Financial resource strain: Not on file  . Food insecurity - worry: Not on file  . Food insecurity - inability: Not on file  . Transportation needs - medical: Not on file  . Transportation needs - non-medical: Not on file  Occupational History  . Not on file  Tobacco Use  . Smoking status: Not on file  Substance  and Sexual Activity  . Alcohol use: Not on file  . Drug use: Not on file  . Sexual activity: Not on file  Other Topics Concern  . Not on file  Social History Narrative   . Not on file   Mother had history of MI/CABG. Denies other history of heart disease. No family history on file.  PHYSICAL EXAM: Vitals:   02/16/17 1955 02/16/17 2010  BP: 133/68 (!) 141/76  Resp: (!) 21 17  Temp: 98.1 F (36.7 C)      Intake/Output Summary (Last 24 hours) at 02/16/2017 2038 Last data filed at 02/16/2017 2000 Gross per 24 hour  Intake 27.5 ml  Output -  Net 27.5 ml    General:  Well appearing. No respiratory difficulty HEENT: normal Neck: supple. no JVD. Carotids 2+ bilat; no bruits. No lymphadenopathy or thryomegaly appreciated. Cor: PMI nondisplaced. Regular rate & rhythm. No rubs, gallops or murmurs. Lungs: clear Abdomen: soft, nontender, nondistended. No hepatosplenomegaly. No bruits or masses. Good bowel sounds. Extremities: no cyanosis, clubbing, rash. Trace nonpitting bilateral edema Neuro: alert & oriented x 3, cranial nerves grossly intact. moves all 4 extremities w/o difficulty. Affect pleasant.  ECG: pending here, tele NSR  Results for orders placed or performed during the hospital encounter of 02/16/17 (from the past 24 hour(s))  MRSA PCR Screening     Status: None   Collection Time: 02/16/17  5:16 PM  Result Value Ref Range   MRSA by PCR NEGATIVE NEGATIVE  Glucose, capillary     Status: Abnormal   Collection Time: 02/16/17  5:44 PM  Result Value Ref Range   Glucose-Capillary 181 (H) 65 - 99 mg/dL  Basic metabolic panel     Status: Abnormal   Collection Time: 02/16/17  7:02 PM  Result Value Ref Range   Sodium 136 135 - 145 mmol/L   Potassium 3.7 3.5 - 5.1 mmol/L   Chloride 101 101 - 111 mmol/L   CO2 22 22 - 32 mmol/L   Glucose, Bld 181 (H) 65 - 99 mg/dL   BUN 13 6 - 20 mg/dL   Creatinine, Ser 0.91 0.44 - 1.00 mg/dL   Calcium 8.5 (L) 8.9 - 10.3 mg/dL   GFR calc non Af Amer >60 >60 mL/min   GFR calc Af Amer >60 >60 mL/min   Anion gap 13 5 - 15  Magnesium     Status: None   Collection Time: 02/16/17  7:02 PM  Result Value Ref  Range   Magnesium 1.7 1.7 - 2.4 mg/dL  Troponin I     Status: Abnormal   Collection Time: 02/16/17  7:02 PM  Result Value Ref Range   Troponin I 1.19 (HH) <0.03 ng/mL  CBC WITH DIFFERENTIAL     Status: Abnormal   Collection Time: 02/16/17  7:02 PM  Result Value Ref Range   WBC 12.7 (H) 4.0 - 10.5 K/uL   RBC 4.01 3.87 - 5.11 MIL/uL   Hemoglobin 12.2 12.0 - 15.0 g/dL   HCT 35.8 (L) 36.0 - 46.0 %   MCV 89.3 78.0 - 100.0 fL   MCH 30.4 26.0 - 34.0 pg   MCHC 34.1 30.0 - 36.0 g/dL   RDW 12.3 11.5 - 15.5 %   Platelets 356 150 - 400 K/uL   Neutrophils Relative % 61 %   Neutro Abs 7.9 (H) 1.7 - 7.7 K/uL   Lymphocytes Relative 28 %   Lymphs Abs 3.5 0.7 - 4.0 K/uL   Monocytes Relative 8 %  Monocytes Absolute 1.0 0.1 - 1.0 K/uL   Eosinophils Relative 2 %   Eosinophils Absolute 0.2 0.0 - 0.7 K/uL   Basophils Relative 1 %   Basophils Absolute 0.1 0.0 - 0.1 K/uL  Protime-INR     Status: None   Collection Time: 02/16/17  7:02 PM  Result Value Ref Range   Prothrombin Time 12.9 11.4 - 15.2 seconds   INR 0.98   APTT     Status: Abnormal   Collection Time: 02/16/17  7:02 PM  Result Value Ref Range   aPTT 39 (H) 24 - 36 seconds  Hemoglobin A1c     Status: Abnormal   Collection Time: 02/16/17  7:03 PM  Result Value Ref Range   Hgb A1c MFr Bld 8.8 (H) 4.8 - 5.6 %   Mean Plasma Glucose 205.86 mg/dL   No results found.   ASSESSMENT/PLAN: Ms. Perfetti is a 71 yo woman with PMH TIA, DM, arthritis, hypertension who presented to [facility] ER yesterday with chest pain. She was found to have a positive troponin at 0.65 per notes from Dr. Rhae Hammock and was transferred to [facility] for NSTEMI management.  NSTEMI: -aspirin 81 mg (decreased from 324 mg at home given likely DAPT) -heparin per pharmacy consult -increase atorvastatin to 80 mg nightly -continue carvedilol 25 mg twice daily -heart healthy diet, NPO at midnight for possible cath -echo -checking lipids -trend troponins, intial here 1.19 -daily  ECG and PRN with chest pain -chest pain free for >24 hours, stop nitro drip, SL nitro or restart drip PRN  Diabetes mellitus, type II -SSI for diabetes while inpatient, A1c elevated at 8.8  Hypertension: -continue amlodipine, carvedilol -uses clonidine only at night, continue  Osteoarthritis, neuropathy -stop celecoxib, will use tylenol PRN -continue gabapentin  Hypothyroidism:  -continue levothyroxine, check TFTs in AM  Chronic peripheral edema -lasix PRN  Buford Dresser, MD, PhD, overnight cardiology provider

## 2017-02-16 NOTE — Telephone Encounter (Signed)
I was notified by [facility] about a transfer request for the patient from the MD in the [facility] ED.  Theresa Ray is a 71 year old female with a history of DM, HTN, and HLD who presented to the ED with chest pain. She reported that her pain was worse with exertion and improved with rest. She also had improvement of her pain with NTG. Initial workup in the ED with ECG demonstrated mild (<68mm) elevation in lead III without ST elevations in other leads; it was reviewed by cardiology at the OSH and felt to not be consistent with STEMI. Troponin was positive at 0.65. She was started on a heparin gtt and NTG gtt. Transfer to [facility] was requested for consideration of [facility].  She was accepted to the cardiology stepdown service.

## 2017-02-16 NOTE — Progress Notes (Signed)
ANTICOAGULATION CONSULT NOTE - Initial Consult  Pharmacy Consult for heparin Indication: chest pain/ACS  Allergies not on file  Patient Measurements: Height: 5' (152.4 cm) Weight: 214 lb (97.1 kg) IBW/kg (Calculated) : 45.5 Heparin Dosing Weight: 68.9kg  Vital Signs: Temp: 99.1 F (37.3 C) (11/18 1700) Temp Source: Oral (11/18 1700) BP: 146/74 (11/18 1700)  Labs: No results for input(s): HGB, HCT, PLT, APTT, LABPROT, INR, HEPARINUNFRC, HEPRLOWMOCWT, CREATININE, CKTOTAL, CKMB, TROPONINI in the last 72 hours.  CrCl cannot be calculated (Patient's most recent lab result is older than the maximum 21 days allowed.).   Medical History: No past medical history on file.  Medications:  Infusions:  . heparin    . nitroGLYCERIN      Assessment: 37 yof presented to OSH with CP. Transferred here for possible cardiac cath. Hgb 12.6 and platelets are 357 per OSH. Initially started on 1000 units/hr but changed to 1300 units/hr at some point.    Goal of Therapy:  Heparin level 0.3-0.7 units/ml Monitor platelets by anticoagulation protocol: Yes   Plan:  Continue heparin gtt at 1300 units/hr for now F/u heparin level at 1930 Daily heparin level and CBC  Krisy Dix, Rande Lawman 02/16/2017,5:47 PM

## 2017-02-17 ENCOUNTER — Inpatient Hospital Stay (HOSPITAL_COMMUNITY): Payer: Medicare HMO

## 2017-02-17 ENCOUNTER — Encounter (HOSPITAL_COMMUNITY): Payer: Self-pay | Admitting: *Deleted

## 2017-02-17 ENCOUNTER — Other Ambulatory Visit: Payer: Self-pay

## 2017-02-17 DIAGNOSIS — I5030 Unspecified diastolic (congestive) heart failure: Secondary | ICD-10-CM

## 2017-02-17 LAB — LIPID PANEL
CHOL/HDL RATIO: 4.2 ratio
Cholesterol: 125 mg/dL (ref 0–200)
HDL: 30 mg/dL — ABNORMAL LOW (ref >40)
LDL Cholesterol: 23 mg/dL (ref 0–99)
Triglycerides: 358 mg/dL — ABNORMAL HIGH (ref <150)
VLDL: 72 mg/dL — ABNORMAL HIGH (ref 0–40)

## 2017-02-17 LAB — PROTIME-INR
INR: 1.02
Prothrombin Time: 13.3 seconds (ref 11.4–15.2)

## 2017-02-17 LAB — CBC
HCT: 36.5 % (ref 36.0–46.0)
Hemoglobin: 12.3 g/dL (ref 12.0–15.0)
MCH: 30.1 pg (ref 26.0–34.0)
MCHC: 33.7 g/dL (ref 30.0–36.0)
MCV: 89.5 fL (ref 78.0–100.0)
PLATELETS: 370 10*3/uL (ref 150–400)
RBC: 4.08 MIL/uL (ref 3.87–5.11)
RDW: 12.4 % (ref 11.5–15.5)
WBC: 10.5 10*3/uL (ref 4.0–10.5)

## 2017-02-17 LAB — GLUCOSE, CAPILLARY
GLUCOSE-CAPILLARY: 294 mg/dL — AB (ref 65–99)
GLUCOSE-CAPILLARY: 259 mg/dL — AB (ref 65–99)
Glucose-Capillary: 227 mg/dL — ABNORMAL HIGH (ref 65–99)
Glucose-Capillary: 275 mg/dL — ABNORMAL HIGH (ref 65–99)

## 2017-02-17 LAB — ECHOCARDIOGRAM COMPLETE
HEIGHTINCHES: 60 in
WEIGHTICAEL: 3406.4 [oz_av]

## 2017-02-17 LAB — HEPARIN LEVEL (UNFRACTIONATED)
Heparin Unfractionated: 0.2 IU/mL — ABNORMAL LOW (ref 0.30–0.70)
Heparin Unfractionated: 0.51 IU/mL (ref 0.30–0.70)

## 2017-02-17 LAB — BASIC METABOLIC PANEL
Anion gap: 10 (ref 5–15)
BUN: 10 mg/dL (ref 6–20)
CO2: 25 mmol/L (ref 22–32)
CREATININE: 0.87 mg/dL (ref 0.44–1.00)
Calcium: 8.8 mg/dL — ABNORMAL LOW (ref 8.9–10.3)
Chloride: 103 mmol/L (ref 101–111)
GFR calc Af Amer: >60 mL/min (ref >60)
GLUCOSE: 226 mg/dL — AB (ref 65–99)
POTASSIUM: 3.6 mmol/L (ref 3.5–5.1)
Sodium: 138 mmol/L (ref 135–145)

## 2017-02-17 LAB — TROPONIN I
TROPONIN I: 1.08 ng/mL — AB (ref <0.03)
TROPONIN I: 1.14 ng/mL — AB (ref <0.03)

## 2017-02-17 LAB — T4, FREE: Free T4: 1.44 ng/dL — ABNORMAL HIGH (ref 0.61–1.12)

## 2017-02-17 LAB — TSH: TSH: 0.95 u[IU]/mL (ref 0.350–4.500)

## 2017-02-17 MED ORDER — HEPARIN BOLUS VIA INFUSION
2000.0000 [IU] | Freq: Once | INTRAVENOUS | Status: AC
  Administered 2017-02-17: 2000 [IU] via INTRAVENOUS
  Filled 2017-02-17: qty 2000

## 2017-02-17 MED ORDER — SODIUM CHLORIDE 0.9 % IV SOLN: 250.0000 mL | INTRAVENOUS | Status: DC | PRN

## 2017-02-17 MED ORDER — SODIUM CHLORIDE 0.9% FLUSH: 3.0000 mL | INTRAVENOUS | Status: DC | PRN

## 2017-02-17 MED ORDER — SODIUM CHLORIDE 0.9 % WEIGHT BASED INFUSION
1.0000 mL/kg/h | INTRAVENOUS | Status: DC
  Administered 2017-02-18: 1 mL/kg/h via INTRAVENOUS

## 2017-02-17 MED ORDER — SODIUM CHLORIDE 0.9 % WEIGHT BASED INFUSION
3.0000 mL/kg/h | INTRAVENOUS | Status: DC
  Administered 2017-02-18: 3 mL/kg/h via INTRAVENOUS

## 2017-02-17 MED ORDER — ASPIRIN 81 MG PO CHEW
81.0000 mg | CHEWABLE_TABLET | ORAL | Status: AC
  Administered 2017-02-18: 81 mg via ORAL
  Filled 2017-02-17: qty 1

## 2017-02-17 MED ORDER — SODIUM CHLORIDE 0.9% FLUSH
3.0000 mL | Freq: Two times a day (BID) | INTRAVENOUS | Status: DC
  Administered 2017-02-17: 3 mL via INTRAVENOUS

## 2017-02-17 NOTE — Progress Notes (Signed)
ANTICOAGULATION CONSULT NOTE - F/u Consult  Pharmacy Consult for heparin Indication: chest pain/ACS  Allergies  Allergen Reactions  . Aggrenox [Aspirin-Dipyridamole Er] Other (See Comments)    Redness in eyes, takes aspirin 325 at home at baseline  . Shellfish Allergy Other (See Comments)    UNKNOWN REACTION. Allergy found on allergy test.    Patient Measurements: Height: 5' (152.4 cm) Weight: 214 lb (97.1 kg) IBW/kg (Calculated) : 45.5 Heparin Dosing Weight: 68.9kg  Vital Signs: Temp: 98.1 F (36.7 C) (11/18 2340) BP: 129/72 (11/19 0355) Pulse Rate: 81 (11/18 2150)  Labs: Recent Labs    02/16/17 1902 02/17/17 0042 02/17/17 0451  HGB 12.2  --   --   HCT 35.8*  --   --   PLT 356  --   --   APTT 39*  --   --   LABPROT 12.9  --  13.3  INR 0.98  --  1.02  HEPARINUNFRC <0.10*  --  0.20*  CREATININE 0.91  --  0.87  TROPONINI 1.19* 1.14* 1.08*    Estimated Creatinine Clearance: 64.6 mL/min (by C-G formula based on SCr of 0.87 mg/dL).   Medical History: No past medical history on file.  Medications:  Infusions:  . heparin 1,450 Units/hr (02/17/17 0556)    Assessment: 38 yof presented to OSH with CP. Transferred here for possible cardiac cath. Heparin per pharmacy.   Heparin level subtherapeutic at 0.20 and no infusion issues per RN. AM CBC in process, but no bleeding noted per RN.    Goal of Therapy:  Heparin level 0.3-0.7 units/ml Monitor platelets by anticoagulation protocol: Yes   Plan:  Increase heparin gtt to 1600 units/hr Heparin level in 6 hrs  Daily heparin level and CBC Monitor for s/s bleeding  Lavonda Jumbo, PharmD Clinical Pharmacist 02/17/17 6:17 AM

## 2017-02-17 NOTE — Plan of Care (Signed)
Pt instructed to call for assistance using the call light or white phone using the RN/NT numbers on the white board, both her and her husband verbalized understanding.

## 2017-02-17 NOTE — Progress Notes (Addendum)
ANTICOAGULATION CONSULT NOTE - F/u Consult  Pharmacy Consult for heparin Indication: chest pain/ACS  Allergies  Allergen Reactions  . Aggrenox [Aspirin-Dipyridamole Er] Other (See Comments)    Redness in eyes, takes aspirin 325 at home at baseline  . Shellfish Allergy Other (See Comments)    UNKNOWN REACTION. Allergy found on allergy test.    Patient Measurements: Height: 5' (152.4 cm) Weight: 212 lb 14.4 oz (96.6 kg) IBW/kg (Calculated) : 45.5 Heparin Dosing Weight: 68.9kg  Vital Signs: Temp: 98 F (36.7 C) (11/19 0612) Temp Source: Oral (11/19 0612) BP: 132/79 (11/19 1100) Pulse Rate: 70 (11/19 1100)  Labs: Recent Labs    02/16/17 1902 02/17/17 0042 02/17/17 0451 02/17/17 1234  HGB 12.2  --  12.3  --   HCT 35.8*  --  36.5  --   PLT 356  --  370  --   APTT 39*  --   --   --   LABPROT 12.9  --  13.3  --   INR 0.98  --  1.02  --   HEPARINUNFRC <0.10*  --  0.20* <0.10*  CREATININE 0.91  --  0.87  --   TROPONINI 1.19* 1.14* 1.08*  --     Estimated Creatinine Clearance: 64.4 mL/min (by C-G formula based on SCr of 0.87 mg/dL).   Medical History: No past medical history on file.  Medications:  Infusions:  . heparin 1,600 Units/hr (02/17/17 0618)    Assessment: 66 yof presented to OSH with CP.  Pharmacy consulted to dose heparin. Plans noted for cath on 11/20 -heparin level is undetectable (RN reports that a new IV line had to be placed as there have been problems with the prior line)   Goal of Therapy:  Heparin level 0.3-0.7 units/ml Monitor platelets by anticoagulation protocol: Yes   Plan:  Heparin bolus 2000 units x1 then increase to 1750 units/hr Heparin level in 6 hrs  Daily heparin level and CBC  Hildred Laser, Pharm D 02/17/2017 3:25 PM

## 2017-02-17 NOTE — Progress Notes (Signed)
Progress Note  Patient Name: Theresa Ray Date of Encounter: 02/17/2017  Primary Cardiologist: New  Subjective   No chest pain this am.   Inpatient Medications    Scheduled Meds: . amLODipine  10 mg Oral Daily  . aspirin EC  81 mg Oral Daily  . atorvastatin  80 mg Oral q1800  . carvedilol  25 mg Oral BID WC  . cloNIDine  0.2 mg Oral QHS  . gabapentin  100 mg Oral TID  . insulin aspart  0-5 Units Subcutaneous QHS  . insulin aspart  0-9 Units Subcutaneous TID WC  . levothyroxine  150 mcg Oral QAC breakfast   Continuous Infusions: . heparin 1,600 Units/hr (02/17/17 0618)   PRN Meds: acetaminophen, Melatonin, nitroGLYCERIN, ondansetron (ZOFRAN) IV   Vital Signs    Vitals:   02/17/17 0055 02/17/17 0355 02/17/17 0612 02/17/17 0748  BP: (!) 119/52 129/72 (!) 141/70 (!) 150/70  Pulse:   80 78  Resp: 17 12 19    Temp:   98 F (36.7 C)   TempSrc:   Oral   SpO2:      Weight:   212 lb 14.4 oz (96.6 kg)   Height:        Intake/Output Summary (Last 24 hours) at 02/17/2017 0908 Last data filed at 02/17/2017 0600 Gross per 24 hour  Intake 511 ml  Output -  Net 511 ml   Filed Weights   02/16/17 1700 02/17/17 0612  Weight: 214 lb (97.1 kg) 212 lb 14.4 oz (96.6 kg)    Telemetry    Sinus - Personally Reviewed  ECG    NSR, inferior Q waves - Personally Reviewed  Physical Exam   GEN: No acute distress.   Neck: No JVD Cardiac: RRR, no murmurs, rubs, or gallops.  Respiratory: Clear to auscultation bilaterally. GI: Soft, nontender, non-distended  MS: No edema; No deformity. Neuro:  Nonfocal  Psych: Normal affect   Labs    Chemistry Recent Labs  Lab 02/16/17 1902 02/17/17 0451  NA 136 138  K 3.7 3.6  CL 101 103  CO2 22 25  GLUCOSE 181* 226*  BUN 13 10  CREATININE 0.91 0.87  CALCIUM 8.5* 8.8*  GFRNONAA >60 >60  GFRAA >60 >60  ANIONGAP 13 10     Hematology Recent Labs  Lab 02/16/17 1902 02/17/17 0451  WBC 12.7* 10.5  RBC 4.01 4.08  HGB  12.2 12.3  HCT 35.8* 36.5  MCV 89.3 89.5  MCH 30.4 30.1  MCHC 34.1 33.7  RDW 12.3 12.4  PLT 356 370    Cardiac Enzymes Recent Labs  Lab 02/16/17 1902 02/17/17 0042 02/17/17 0451  TROPONINI 1.19* 1.14* 1.08*   No results for input(s): TROPIPOC in the last 168 hours.   BNPNo results for input(s): BNP, PROBNP in the last 168 hours.   DDimer No results for input(s): DDIMER in the last 168 hours.   Radiology    X-ray Chest Pa And Lateral  Result Date: 02/16/2017 CLINICAL DATA:  71 y/o  F; NSTEMI. EXAM: CHEST  2 VIEW COMPARISON:  02/15/2017 chest radiograph FINDINGS: Stable borderline cardiomegaly. No focal consolidation. No pleural effusion or pneumothorax. No acute osseous abnormality is evident. Mild discogenic degenerative changes of thoracic spine. IMPRESSION: No acute pulmonary process identified. Stable borderline cardiomegaly. Electronically Signed   By: Kristine Garbe M.D.   On: 02/16/2017 23:11    Cardiac Studies     Patient Profile     71 y.o. female with history of TIA,  DM, OA and HTN admitted with NSTEMI  Assessment & Plan    1. NSTEMI: Pt admitted with chest pain and troponin is elevated c/w NSTEMI. No known CAD. She is now on ASA, statin and beta blocker as well as IV heparin and IV NTG. She will need a cardiac cath. Troponin is trending down and she is chest pain free on current medical therapy. Due to the busy cath schedule and the fact that she is chest pain free, will plan cardiac cath tomorrow. She can resume diet today and then NPO at midnight. Echo pending today.   2. HTN: BP controlled. Continue current meds.   3. DM: SSI    For questions or updates, please contact [place] Please consult [email or web address] for contact info under Cardiology/STEMI.      Signed, Lauree Chandler, MD  02/17/2017, 9:08 AM

## 2017-02-17 NOTE — Plan of Care (Signed)
Pt is alert and oriented and follows commands, uses call light as needed for assistance, VSS. Pt is on IV HEparin, will continue to monitor.

## 2017-02-17 NOTE — H&P (View-Only) (Signed)
Progress Note  Patient Name: Theresa Ray Date of Encounter: 02/17/2017  Primary Cardiologist: New  Subjective   No chest pain this am.   Inpatient Medications    Scheduled Meds: . amLODipine  10 mg Oral Daily  . aspirin EC  81 mg Oral Daily  . atorvastatin  80 mg Oral q1800  . carvedilol  25 mg Oral BID WC  . cloNIDine  0.2 mg Oral QHS  . gabapentin  100 mg Oral TID  . insulin aspart  0-5 Units Subcutaneous QHS  . insulin aspart  0-9 Units Subcutaneous TID WC  . levothyroxine  150 mcg Oral QAC breakfast   Continuous Infusions: . heparin 1,600 Units/hr (02/17/17 0618)   PRN Meds: acetaminophen, Melatonin, nitroGLYCERIN, ondansetron (ZOFRAN) IV   Vital Signs    Vitals:   02/17/17 0055 02/17/17 0355 02/17/17 0612 02/17/17 0748  BP: (!) 119/52 129/72 (!) 141/70 (!) 150/70  Pulse:   80 78  Resp: 17 12 19    Temp:   98 F (36.7 C)   TempSrc:   Oral   SpO2:      Weight:   212 lb 14.4 oz (96.6 kg)   Height:        Intake/Output Summary (Last 24 hours) at 02/17/2017 0908 Last data filed at 02/17/2017 0600 Gross per 24 hour  Intake 511 ml  Output -  Net 511 ml   Filed Weights   02/16/17 1700 02/17/17 0612  Weight: 214 lb (97.1 kg) 212 lb 14.4 oz (96.6 kg)    Telemetry    Sinus - Personally Reviewed  ECG    NSR, inferior Q waves - Personally Reviewed  Physical Exam   GEN: No acute distress.   Neck: No JVD Cardiac: RRR, no murmurs, rubs, or gallops.  Respiratory: Clear to auscultation bilaterally. GI: Soft, nontender, non-distended  MS: No edema; No deformity. Neuro:  Nonfocal  Psych: Normal affect   Labs    Chemistry Recent Labs  Lab 02/16/17 1902 02/17/17 0451  NA 136 138  K 3.7 3.6  CL 101 103  CO2 22 25  GLUCOSE 181* 226*  BUN 13 10  CREATININE 0.91 0.87  CALCIUM 8.5* 8.8*  GFRNONAA >60 >60  GFRAA >60 >60  ANIONGAP 13 10     Hematology Recent Labs  Lab 02/16/17 1902 02/17/17 0451  WBC 12.7* 10.5  RBC 4.01 4.08  HGB  12.2 12.3  HCT 35.8* 36.5  MCV 89.3 89.5  MCH 30.4 30.1  MCHC 34.1 33.7  RDW 12.3 12.4  PLT 356 370    Cardiac Enzymes Recent Labs  Lab 02/16/17 1902 02/17/17 0042 02/17/17 0451  TROPONINI 1.19* 1.14* 1.08*   No results for input(s): TROPIPOC in the last 168 hours.   BNPNo results for input(s): BNP, PROBNP in the last 168 hours.   DDimer No results for input(s): DDIMER in the last 168 hours.   Radiology    X-ray Chest Pa And Lateral  Result Date: 02/16/2017 CLINICAL DATA:  71 y/o  F; NSTEMI. EXAM: CHEST  2 VIEW COMPARISON:  02/15/2017 chest radiograph FINDINGS: Stable borderline cardiomegaly. No focal consolidation. No pleural effusion or pneumothorax. No acute osseous abnormality is evident. Mild discogenic degenerative changes of thoracic spine. IMPRESSION: No acute pulmonary process identified. Stable borderline cardiomegaly. Electronically Signed   By: Kristine Garbe M.D.   On: 02/16/2017 23:11    Cardiac Studies     Patient Profile     71 y.o. female with history of TIA,  DM, OA and HTN admitted with NSTEMI  Assessment & Plan    1. NSTEMI: Pt admitted with chest pain and troponin is elevated c/w NSTEMI. No known CAD. She is now on ASA, statin and beta blocker as well as IV heparin and IV NTG. She will need a cardiac cath. Troponin is trending down and she is chest pain free on current medical therapy. Due to the busy cath schedule and the fact that she is chest pain free, will plan cardiac cath tomorrow. She can resume diet today and then NPO at midnight. Echo pending today.   2. HTN: BP controlled. Continue current meds.   3. DM: SSI    For questions or updates, please contact [place] Please consult [email or web address] for contact info under Cardiology/STEMI.      Signed, Lauree Chandler, MD  02/17/2017, 9:08 AM

## 2017-02-17 NOTE — Progress Notes (Signed)
ANTICOAGULATION CONSULT NOTE - F/u Consult  Pharmacy Consult for heparin Indication: chest pain/ACS  Allergies  Allergen Reactions  . Aggrenox [Aspirin-Dipyridamole Er] Other (See Comments)    Redness in eyes, takes aspirin 325 at home at baseline  . Shellfish Allergy Other (See Comments)    UNKNOWN REACTION. Allergy found on allergy test.    Patient Measurements: Height: 5' (152.4 cm) Weight: 212 lb 14.4 oz (96.6 kg) IBW/kg (Calculated) : 45.5 Heparin Dosing Weight: 68.9kg  Vital Signs: Temp: 99.8 F (37.7 C) (11/19 2040) Temp Source: Oral (11/19 2040) BP: 147/78 (11/19 2105) Pulse Rate: 70 (11/19 2040)  Labs: Recent Labs    02/16/17 1902 02/17/17 0042 02/17/17 0451 02/17/17 1234 02/17/17 2156  HGB 12.2  --  12.3  --   --   HCT 35.8*  --  36.5  --   --   PLT 356  --  370  --   --   APTT 39*  --   --   --   --   LABPROT 12.9  --  13.3  --   --   INR 0.98  --  1.02  --   --   HEPARINUNFRC <0.10*  --  0.20* <0.10* 0.51  CREATININE 0.91  --  0.87  --   --   TROPONINI 1.19* 1.14* 1.08*  --   --     Estimated Creatinine Clearance: 64.4 mL/min (by C-G formula based on SCr of 0.87 mg/dL).   Medical History: No past medical history on file.  Medications:  Infusions:  . sodium chloride    . [START ON 02/18/2017] sodium chloride     Followed by  . [START ON 02/18/2017] sodium chloride    . heparin 1,750 Units/hr (02/17/17 2333)    Assessment: 48 yof presented to OSH with CP.  Pharmacy consulted to dose heparin. Plans noted for cath on 11/20.  Heparin level is therapeutic at 0.51. No new CBC, however, no bleeding noted.   Goal of Therapy:  Heparin level 0.3-0.7 units/ml Monitor platelets by anticoagulation protocol: Yes   Plan:  Continue heparin gtt at 1750 units/hr Heparin level with AM labs to confirm  Daily heparin level and CBC Monitor for s/s bleeding F/u cardiology plans   Lavonda Jumbo, PharmD Clinical Pharmacist 02/17/17 11:38  PM

## 2017-02-17 NOTE — Progress Notes (Signed)
Inpatient Diabetes Program Recommendations  AACE/ADA: New Consensus Statement on Inpatient Glycemic Control (2015)  Target Ranges:  Prepandial:   less than 140 mg/dL      Peak postprandial:   less than 180 mg/dL (1-2 hours)      Critically ill patients:  140 - 180 mg/dL   Results for SONNIA, STRONG (MRN [id number]) as of 02/17/2017 13:17  Ref. Range 02/17/2017 07:44 02/17/2017 12:28  Glucose-Capillary Latest Ref Range: 65 - 99 mg/dL 227 (H) 294 (H)    Home DM Meds: Januvia 50 mg daily       Metformin 1000 mg BID       Glipizide 10 mg BID  Current Insulin Orders: Novolog Sensitive Correction Scale/ SSI (0-9 units) TID AC + HS       MD- Please consider the following in-hospital insulin adjustments while patient's home oral DM meds are on hold:  1. Start Lantus 10 units daily (0.1 units/kg dosing)  2. Start Novolog Meal Coverage: Novolog 4 units TID with meals (hold if pt eats <50% of meal)       --Will follow patient during hospitalization--  Wyn Quaker RN, MSN, CDE Diabetes Coordinator Inpatient Glycemic Control Team Team Pager: [phone] (8a-5p)

## 2017-02-17 NOTE — Progress Notes (Signed)
  Echocardiogram 2D Echocardiogram has been performed.  Theresa Ray T Female Iafrate 02/17/2017, 9:15 AM

## 2017-02-18 ENCOUNTER — Encounter (HOSPITAL_COMMUNITY)
Admission: AD | Disposition: A | Discharge status: Home Health | Payer: Self-pay | Source: Other Acute Inpatient Hospital | Attending: Internal Medicine

## 2017-02-18 ENCOUNTER — Encounter (HOSPITAL_COMMUNITY): Payer: Self-pay | Admitting: Cardiology

## 2017-02-18 DIAGNOSIS — I10 Essential (primary) hypertension: Secondary | ICD-10-CM

## 2017-02-18 DIAGNOSIS — I25110 Atherosclerotic heart disease of native coronary artery with unstable angina pectoris: Secondary | ICD-10-CM

## 2017-02-18 DIAGNOSIS — I214 Non-ST elevation (NSTEMI) myocardial infarction: Secondary | ICD-10-CM

## 2017-02-18 HISTORY — PX: LEFT HEART CATH AND CORONARY ANGIOGRAPHY: CATH118249

## 2017-02-18 LAB — GLUCOSE, CAPILLARY
GLUCOSE-CAPILLARY: 232 mg/dL — AB (ref 65–99)
Glucose-Capillary: 317 mg/dL — ABNORMAL HIGH (ref 65–99)
Glucose-Capillary: 210 mg/dL — ABNORMAL HIGH (ref 65–99)
Glucose-Capillary: 248 mg/dL — ABNORMAL HIGH (ref 65–99)

## 2017-02-18 LAB — BASIC METABOLIC PANEL
ANION GAP: 10 (ref 5–15)
BUN: 11 mg/dL (ref 6–20)
CHLORIDE: 103 mmol/L (ref 101–111)
CO2: 24 mmol/L (ref 22–32)
Calcium: 8.7 mg/dL — ABNORMAL LOW (ref 8.9–10.3)
Creatinine, Ser: 0.85 mg/dL (ref 0.44–1.00)
GFR calc Af Amer: >60 mL/min (ref >60)
GLUCOSE: 227 mg/dL — AB (ref 65–99)
POTASSIUM: 3.6 mmol/L (ref 3.5–5.1)
Sodium: 137 mmol/L (ref 135–145)

## 2017-02-18 LAB — CBC
HCT: 35.8 % — ABNORMAL LOW (ref 36.0–46.0)
Hemoglobin: 12.2 g/dL (ref 12.0–15.0)
MCH: 30.3 pg (ref 26.0–34.0)
MCHC: 34.1 g/dL (ref 30.0–36.0)
MCV: 89.1 fL (ref 78.0–100.0)
Platelets: 339 10*3/uL (ref 150–400)
RBC: 4.02 MIL/uL (ref 3.87–5.11)
RDW: 12.2 % (ref 11.5–15.5)
WBC: 9.7 10*3/uL (ref 4.0–10.5)

## 2017-02-18 LAB — HEPARIN LEVEL (UNFRACTIONATED)
HEPARIN UNFRACTIONATED: 0.62 [IU]/mL (ref 0.30–0.70)
Heparin Unfractionated: 0.22 IU/mL — ABNORMAL LOW (ref 0.30–0.70)

## 2017-02-18 SURGERY — LEFT HEART CATH AND CORONARY ANGIOGRAPHY
Anesthesia: LOCAL

## 2017-02-18 MED ORDER — SODIUM CHLORIDE 0.9% FLUSH
3.0000 mL | Freq: Two times a day (BID) | INTRAVENOUS | Status: DC
  Administered 2017-02-18 – 2017-02-23 (×9): 3 mL via INTRAVENOUS

## 2017-02-18 MED ORDER — VERAPAMIL HCL 2.5 MG/ML IV SOLN
INTRAVENOUS | Status: AC
  Filled 2017-02-18: qty 2

## 2017-02-18 MED ORDER — HEPARIN (PORCINE) IN NACL 100-0.45 UNIT/ML-% IJ SOLN
1850.0000 [IU]/h | INTRAMUSCULAR | Status: DC
  Administered 2017-02-19 – 2017-02-23 (×8): 1850 [IU]/h via INTRAVENOUS
  Filled 2017-02-18 (×10): qty 250

## 2017-02-18 MED ORDER — FENTANYL CITRATE (PF) 100 MCG/2ML IJ SOLN
INTRAMUSCULAR | Status: AC
  Filled 2017-02-18: qty 2

## 2017-02-18 MED ORDER — HEPARIN (PORCINE) IN NACL 2-0.9 UNIT/ML-% IJ SOLN
INTRAMUSCULAR | Status: AC | PRN
  Administered 2017-02-18: 1000 mL

## 2017-02-18 MED ORDER — SODIUM CHLORIDE 0.9% FLUSH: 3.0000 mL | INTRAVENOUS | Status: DC | PRN

## 2017-02-18 MED ORDER — MIDAZOLAM HCL 2 MG/2ML IJ SOLN
INTRAMUSCULAR | Status: DC | PRN
  Administered 2017-02-18: 2 mg via INTRAVENOUS

## 2017-02-18 MED ORDER — HEPARIN SODIUM (PORCINE) 1000 UNIT/ML IJ SOLN
INTRAMUSCULAR | Status: DC | PRN
  Administered 2017-02-18: 5000 [IU] via INTRAVENOUS

## 2017-02-18 MED ORDER — FENTANYL CITRATE (PF) 100 MCG/2ML IJ SOLN
INTRAMUSCULAR | Status: DC | PRN
  Administered 2017-02-18: 25 ug via INTRAVENOUS

## 2017-02-18 MED ORDER — IOPAMIDOL (ISOVUE-370) INJECTION 76%
INTRAVENOUS | Status: AC
  Filled 2017-02-18: qty 100

## 2017-02-18 MED ORDER — LIDOCAINE HCL (PF) 1 % IJ SOLN
INTRAMUSCULAR | Status: AC
  Filled 2017-02-18: qty 30

## 2017-02-18 MED ORDER — IOPAMIDOL (ISOVUE-370) INJECTION 76%
INTRAVENOUS | Status: DC | PRN
  Administered 2017-02-18: 75 mL via INTRA_ARTERIAL

## 2017-02-18 MED ORDER — LIDOCAINE HCL (PF) 1 % IJ SOLN
INTRAMUSCULAR | Status: DC | PRN
  Administered 2017-02-18: 2 mL via SUBCUTANEOUS

## 2017-02-18 MED ORDER — VERAPAMIL HCL 2.5 MG/ML IV SOLN
INTRAVENOUS | Status: DC | PRN
  Administered 2017-02-18: 10 mL via INTRA_ARTERIAL

## 2017-02-18 MED ORDER — SODIUM CHLORIDE 0.9 % IV SOLN: INTRAVENOUS | Status: AC

## 2017-02-18 MED ORDER — HEPARIN SODIUM (PORCINE) 1000 UNIT/ML IJ SOLN
INTRAMUSCULAR | Status: AC
  Filled 2017-02-18: qty 1

## 2017-02-18 MED ORDER — SODIUM CHLORIDE 0.9 % IV SOLN: 250.0000 mL | INTRAVENOUS | Status: DC | PRN

## 2017-02-18 MED ORDER — HEPARIN (PORCINE) IN NACL 2-0.9 UNIT/ML-% IJ SOLN
INTRAMUSCULAR | Status: AC
  Filled 2017-02-18: qty 1000

## 2017-02-18 MED ORDER — MIDAZOLAM HCL 2 MG/2ML IJ SOLN
INTRAMUSCULAR | Status: AC
  Filled 2017-02-18: qty 2

## 2017-02-18 SURGICAL SUPPLY — 11 items
CATH INFINITI 5FR ANG PIGTAIL (CATHETERS) ×2 IMPLANT
CATH OPTITORQUE TIG 4.0 5F (CATHETERS) ×1 IMPLANT
DEVICE RAD COMP TR BAND LRG (VASCULAR PRODUCTS) ×1 IMPLANT
GLIDESHEATH SLEND A-KIT 6F 22G (SHEATH) ×1 IMPLANT
GUIDEWIRE INQWIRE 1.5J.035X260 (WIRE) IMPLANT
INQWIRE 1.5J .035X260CM (WIRE) ×2
KIT HEART LEFT (KITS) ×2 IMPLANT
PACK CARDIAC CATHETERIZATION (CUSTOM PROCEDURE TRAY) ×2 IMPLANT
SYR MEDRAD MARK V 150ML (SYRINGE) ×2 IMPLANT
TRANSDUCER W/STOPCOCK (MISCELLANEOUS) ×2 IMPLANT
TUBING CIL FLEX 10 FLL-RA (TUBING) ×2 IMPLANT

## 2017-02-18 NOTE — Consult Note (Signed)
[address]       [place] [address]             [phone]        Sarely F Fana Dillard Medical Record #[id number] Date of Birth: 1945/08/14  Referring: Dr. Glenetta Hew Primary Care: No primary care provider on file.  Chief Complaint:   Chest tightness  History of Present Illness:     Ms. Casady is a 71 year old female with a past medical history of hypertension, TIA, OSA, previous tobacco abuse, diabetes mellitus type 2,  Hypothyroidism and hyperlipidemia who presented to [facility] emergency department on 11/17 with chest pain.  She was found to have a troponin of 0.65 and was transferred to [facility] for NSTEMI management.  She notes over the past few months having periods of time where she did not feel well.  This was not related to exertion and could happen at any time and would resolve spontaneously.  She awoke from sleep with a tightness in her chest.  The pain resolved spontaneously then reoccurred at 11 AM and then again at 4 PM.  She describes the pain as a tight pressure in the center of her chest pain with bilateral arm numbness. No associated nausea, vomiting, or diaphoresis.  On 11/20 she underwent cardiac catheterization which revealed multivessel disease and CABG was recommended.  We are consulted for possible surgical revascularization.  She is currently on heparin gtt with no chest pain.  Patient has history of lap band placement by Dr. Lucia Gaskins 2009, but she has not been back for any follow-up for the past several years.    Current Activity/ Functional Status: Patient was independent with mobility/ambulation, transfers, ADL's, IADL's.   Zubrod Score: At the time of surgery this patient's most appropriate activity status/level should be described as: []     0    Normal activity, no symptoms [x]     1    Restricted in physical strenuous activity but ambulatory, able to do out light work []     2    Ambulatory and capable of self care, unable to  do work activities, up and about                 more than 50%  Of the time                            []     3    Only limited self care, in bed greater than 50% of waking hours []     4    Completely disabled, no self care, confined to bed or chair []     5    Moribund   Social History   Tobacco Use  Smoking Status Former Smoker  . Types: Cigarettes  . Start date: 78  . Last attempt to quit: 1975  . Years since quitting: 43.9  Smokeless Tobacco Never Used    Social History   Substance and Sexual Activity  Alcohol Use Not on file    Social History   Socioeconomic History  . Marital status: Single    Spouse name: Not on file  . Number of children: Not on file  . Years of education: Not on file  . Highest education level: Not on file  Social Needs  . Financial resource strain: Not on file  . Food insecurity - worry: Not on file  . Food insecurity - inability: Not on  file  . Transportation needs - medical: Not on file  . Transportation needs - non-medical: Not on file  Occupational History  . Not on file  Tobacco Use  . Smoking status: Former Smoker    Types: Cigarettes    Start date: 1967    Last attempt to quit: 1975    Years since quitting: 43.9  . Smokeless tobacco: Never Used  Substance and Sexual Activity  . Alcohol use: Not on file  . Drug use: Not on file  . Sexual activity: Not on file  Other Topics Concern  . Not on file  Social History Narrative  . Not on file    Allergies  Allergen Reactions  . Aggrenox [Aspirin-Dipyridamole Er] Other (See Comments)    Redness in eyes, takes aspirin 325 at home at baseline  . Shellfish Allergy Other (See Comments)    UNKNOWN REACTION. Allergy found on allergy test.    Current Facility-Administered Medications  Medication Dose Route Frequency Provider Last Rate Last Dose  . 0.9 %  sodium chloride infusion   Intravenous Continuous Leonie Man, MD      . 0.9 %  sodium chloride infusion  250 mL Intravenous  PRN Leonie Man, MD      . acetaminophen (TYLENOL) tablet 650 mg  650 mg Oral Q4H PRN Buford Dresser, MD   650 mg at 02/16/17 1826  . amLODipine (NORVASC) tablet 10 mg  10 mg Oral Daily Buford Dresser, MD   10 mg at 02/18/17 1025  . aspirin EC tablet 81 mg  81 mg Oral Daily Buford Dresser, MD   81 mg at 02/18/17 1026  . atorvastatin (LIPITOR) tablet 80 mg  80 mg Oral q1800 Buford Dresser, MD   80 mg at 02/17/17 1730  . carvedilol (COREG) tablet 25 mg  25 mg Oral BID WC Buford Dresser, MD   25 mg at 02/18/17 1026  . cloNIDine (CATAPRES) tablet 0.2 mg  0.2 mg Oral QHS Buford Dresser, MD   0.2 mg at 02/17/17 2105  . gabapentin (NEURONTIN) capsule 100 mg  100 mg Oral TID Buford Dresser, MD   100 mg at 02/18/17 1026  . heparin ADULT infusion 100 units/mL (25000 units/250mL sodium chloride 0.45%)  1,750 Units/hr Intravenous Continuous Kris Mouton, RPH      . insulin aspart (novoLOG) injection 0-5 Units  0-5 Units Subcutaneous QHS Buford Dresser, MD   3 Units at 02/17/17 2127  . insulin aspart (novoLOG) injection 0-9 Units  0-9 Units Subcutaneous TID WC Buford Dresser, MD   7 Units at 02/18/17 1223  . levothyroxine (SYNTHROID, LEVOTHROID) tablet 150 mcg  150 mcg Oral QAC breakfast Buford Dresser, MD   150 mcg at 02/18/17 0630  . Melatonin TABS 3 mg  3 mg Oral QHS PRN Pixie Casino, MD   3 mg at 02/17/17 2105  . nitroGLYCERIN (NITROSTAT) SL tablet 0.4 mg  0.4 mg Sublingual Q5 min PRN Buford Dresser, MD      . ondansetron [facility]) injection 4 mg  4 mg Intravenous Q6H PRN Buford Dresser, MD      . sodium chloride flush (NS) 0.9 % injection 3 mL  3 mL Intravenous Q12H Leonie Man, MD      . sodium chloride flush (NS) 0.9 % injection 3 mL  3 mL Intravenous PRN Leonie Man, MD        Medications Prior to Admission  Medication Sig Dispense Refill Last Dose  . amLODipine (NORVASC) 10 MG  tablet  Take 10 mg daily by mouth.     Marland Kitchen aspirin EC 325 MG tablet Take 325 mg daily by mouth.     Marland Kitchen atorvastatin (LIPITOR) 20 MG tablet Take 20 mg daily by mouth.     . carvedilol (COREG) 25 MG tablet Take 25 mg 2 (two) times daily by mouth.     . celecoxib (CELEBREX) 100 MG capsule Take 100 mg daily by mouth.     . cholecalciferol (VITAMIN D) 1000 units tablet Take 3,000 Units 2 (two) times daily by mouth.     . cloNIDine (CATAPRES) 0.2 MG tablet Take 0.2 mg at bedtime by mouth.      . co-enzyme Q-10 30 MG capsule Take 30 mg daily by mouth.     . furosemide (LASIX) 40 MG tablet Take 40 mg daily by mouth.     . gabapentin (NEURONTIN) 100 MG capsule Take 100 mg 3 (three) times daily by mouth.     Marland Kitchen glipiZIDE (GLUCOTROL) 10 MG tablet Take 10 mg 2 (two) times daily by mouth.     . Glucosamine-Chondroit-Vit C-Mn (GLUCOSAMINE CHONDR 500 COMPLEX PO) Take 1 tablet 2 (two) times daily by mouth.     Javier Docker Oil 1000 MG CAPS Take 1,000 mg daily by mouth.     . levothyroxine (SYNTHROID, LEVOTHROID) 175 MCG tablet Take 175 mcg daily before breakfast by mouth.     . MELATONIN PO Take 10 mg at bedtime by mouth.     . metFORMIN (GLUCOPHAGE) 1000 MG tablet Take 1,000 mg 2 (two) times daily by mouth.     . methylPREDNISolone (MEDROL DOSEPAK) 4 MG TBPK tablet See admin instructions. Started on 02-14-17  0   . Multiple Vitamin (MULTIVITAMIN WITH MINERALS) TABS tablet Take 1 tablet daily by mouth.     . potassium gluconate (EQL POTASSIUM GLUCONATE) 595 (99 K) MG TABS tablet Take 1,785 mg 2 (two) times daily by mouth.     . sitaGLIPtin (JANUVIA) 50 MG tablet Take 50 mg daily by mouth.       No family history on file.   Review of Systems:  Pertinent items are noted in HPI.     Cardiac Review of Systems: Y or N  Chest Pain [ Y   ]  Resting SOB [ Y  ] Exertional SOB  [Y  ]  Orthopnea [  ]   Pedal Edema [ Y  ]    Palpitations [  ] Syncope  [  ]   Presyncope [   ]  General Review of Systems: [Y] = yes [   ]=no Constitional: recent weight change [  ]; anorexia [  ]; fatigue [  ]; nausea [  ]; night sweats Aqua.Slicker  ]; fever Aqua.Slicker  ]; or chills [  ]                                                               Dental: poor dentition[  ]; Last Dentist visit:   Eye : blurred vision [  ]; diplopia [   ]; vision changes [  ];  Amaurosis fugax[  ]; Resp: cough [ N ];  wheezing[N  ];  hemoptysis[  ]; shortness of breath[Y  ]; paroxysmal nocturnal dyspnea[  ]; dyspnea on  exertion[ Y ]; or orthopnea[  ];  GI:  gallstones[  ], vomiting[N  ];  dysphagia[ N ]; melena[  ];  hematochezia [  ]; heartburn[  ];   Hx of  Colonoscopy[  ]; GU: kidney stones [  ]; hematuria[  ];   dysuria [  ];  nocturia[  ];  history of     obstruction [ N ]; urinary frequency [  ]             Skin: rash, swelling[  ];, hair loss[  ];  peripheral edema[ Y ];  or itching[  ]; Musculosketetal: myalgias[  ];  joint swelling[  ];  joint erythema[  ];  joint pain[  ];  back pain[  ];  Heme/Lymph: bruising[  ];  bleeding[  ];  anemia[  ];  Neuro: TIA [ Y ];  headaches[  ];  stroke[  ];  vertigo[  ];  seizures[  ];   paresthesias[  ];  difficulty walking[  ];  Psych:depression[  ]; anxiety[  ];  Endocrine: diabetes[Y  ];  thyroid dysfunction[Y  ];  Immunizations: Flu [  ]; Pneumococcal[  ];  Other:  Physical Exam: BP 136/73 (BP Location: Left Arm)   Pulse 71   Temp 98 F (36.7 C) (Oral)   Resp 19   Ht 5' (1.524 m)   Wt 211 lb 12.8 oz (96.1 kg)   LMP  (LMP Unknown)   SpO2 95%   BMI 41.36 kg/m    General appearance: alert, cooperative and no distress Resp: clear to auscultation bilaterally Cardio: regular rate and rhythm, S1, S2 normal, no murmur, click, rub or gallop GI: soft, non-tender; bowel sounds normal; no masses,  no organomegaly Extremities: 2+ non pitting edema Neurologic: Grossly normal Patient has no carotid bruits  Diagnostic Studies & Laboratory data:  Cardiac Catheterization 02/18/2017   Prox LAD lesion is 70%  stenosed -at Diag 1. Dist LAD lesion is 99% stenosed.  Ost 1st Diag lesion is 85% stenosed. Very small caliber vessel  Ramus Distal lesion is 100% stenosed.  Prox RCA lesion is 75% stenosed. Mid RCA to Dist RCA lesion is 100% stenosed.--The R PDA and PL systems fill via left-to-right collaterals with brisk flow.  Prox Cx lesion is 50% stenosed -proximal to OM1  Ost 1st Mrg lesion is 100% stenosed. Ostial occlusion. The vessel fills retrograde via left to left collaterals  Dist Cx lesion is 75% stenosed -in the band prior to small left posterolateral branch  There is mild left ventricular systolic dysfunction. The left ventricular ejection fraction is 50-55% by visual estimate. LV end diastolic pressure is moderately elevated.   I have independently reviewed the above  cath films and reviewed the findings with the  patient .   Echocardiogram 02/17/2017  Study Conclusions  - Left ventricle: The cavity size was normal. Systolic function was   normal. The estimated ejection fraction was in the range of 55%   to 60%. Probable hypokinesis of the basalinferior and   inferoseptal myocardium; consistent with ischemia in the   distribution of the right coronary artery. Features are   consistent with a pseudonormal left ventricular filling pattern,   with concomitant abnormal relaxation and increased filling   pressure (grade 2 diastolic dysfunction). - Mitral valve: There was mild regurgitation.     Recent Radiology Findings:   X-ray Chest Pa And Lateral  Result Date: 02/16/2017 CLINICAL DATA:  72 y/o  F; NSTEMI. EXAM: CHEST  2 VIEW  COMPARISON:  02/15/2017 chest radiograph FINDINGS: Stable borderline cardiomegaly. No focal consolidation. No pleural effusion or pneumothorax. No acute osseous abnormality is evident. Mild discogenic degenerative changes of thoracic spine. IMPRESSION: No acute pulmonary process identified. Stable borderline cardiomegaly. Electronically Signed   By: Kristine Garbe M.D.   On: 02/16/2017 23:11     I have independently reviewed the above radiologic studies.  Recent Lab Findings: Lab Results  Component Value Date   WBC 9.7 02/18/2017   HGB 12.2 02/18/2017   HCT 35.8 (L) 02/18/2017   PLT 339 02/18/2017   GLUCOSE 227 (H) 02/18/2017   CHOL 125 02/17/2017   TRIG 358 (H) 02/17/2017   HDL 30 (L) 02/17/2017   LDLCALC 23 02/17/2017   NA 137 02/18/2017   K 3.6 02/18/2017   CL 103 02/18/2017   CREATININE 0.85 02/18/2017   BUN 11 02/18/2017   CO2 24 02/18/2017   TSH 0.950 02/17/2017   INR 1.02 02/17/2017   HGBA1C 8.8 (H) 02/16/2017      Assessment / Plan:   #1 long-standing diabetes mellitus with complications of coronary artery disease #2 non-STEMI myocardial infarction with three-vessel coronary artery disease #3 history of obesity, status post lap banding current BMI 41 #4 history of TIAs, patient notes primarily involved her speech most recently 4-5 years ago  Discussed with patient and her husband in detail the treatment options with her significant coronary artery disease and recent presentation with symptoms, the right appears to have acutely occluded recently, there is chronic occlusion of the circumflex with faint collateral filling, is hard to judge the size of this vessel she also has residual LAD disease.  In the face of multivessel disease in a diabetic coronary artery bypass grafting offers the best long-term benefit compared to multiple angioplasties.  The patient is willing to proceed.  She is currently on heparin, with current schedule she could be accommodated with surgery on Monday. With her history of TIAs I have scheduled preop carotid Dopplers.   I  spent 45  minutes counseling the patient face to face and 50% or more the  time was spent in counseling and coordination of care. The total time spent in the appointment was 60 minutes.  Grace Isaac MD      [address] [place]  [address] Office [phone]   [place]

## 2017-02-18 NOTE — Interval H&P Note (Signed)
History and Physical Interval Note:  02/18/2017 7:20 AM  Theresa Ray  has presented today for surgery, with the diagnosis of n stemi  The various methods of treatment have been discussed with the patient and family. After consideration of risks, benefits and other options for treatment, the patient has consented to  Procedure(s): LEFT HEART CATH AND CORONARY ANGIOGRAPHY (N/A) possible PERCUTANEOUS CORONARY INTERVENTION as a surgical intervention .  The patient's history has been reviewed, patient examined, no change in status, stable for surgery.  I have reviewed the patient's chart and labs.  Questions were answered to the patient's satisfaction.    Cath Lab Visit (complete for each Cath Lab visit)  Clinical Evaluation Leading to the Procedure:   ACS: Yes.    Non-ACS:    Anginal Classification: CCS IV  Anti-ischemic medical therapy: Maximal Therapy (2 or more classes of medications)  Non-Invasive Test Results: No non-invasive testing performed  Prior CABG: No previous CABG   Theresa Ray

## 2017-02-18 NOTE — Progress Notes (Addendum)
ANTICOAGULATION CONSULT NOTE - Follow Up Consult  Pharmacy Consult for Heparin  Indication: CAD awaiting CABG  Allergies  Allergen Reactions  . Aggrenox [Aspirin-Dipyridamole Er] Other (See Comments)    Redness in eyes, takes aspirin 325 at home at baseline  . Shellfish Allergy Other (See Comments)    UNKNOWN REACTION. Allergy found on allergy test.    Patient Measurements: Height: 5' (152.4 cm) Weight: 211 lb 12.8 oz (96.1 kg) IBW/kg (Calculated) : 45.5   Vital Signs: Temp: 98.4 F (36.9 C) (11/20 1936) Temp Source: Oral (11/20 1936) BP: 153/74 (11/20 1936) Pulse Rate: 75 (11/20 1936)  Labs: Recent Labs    02/16/17 1902 02/17/17 0042 02/17/17 0451  02/17/17 2156 02/18/17 0213 02/18/17 2222  HGB 12.2  --  12.3  --   --  12.2  --   HCT 35.8*  --  36.5  --   --  35.8*  --   PLT 356  --  370  --   --  339  --   APTT 39*  --   --   --   --   --   --   LABPROT 12.9  --  13.3  --   --   --   --   INR 0.98  --  1.02  --   --   --   --   HEPARINUNFRC <0.10*  --  0.20*   < > 0.51 0.62 0.22*  CREATININE 0.91  --  0.87  --   --  0.85  --   TROPONINI 1.19* 1.14* 1.08*  --   --   --   --    < > = values in this interval not displayed.    Estimated Creatinine Clearance: 65.7 mL/min (by C-G formula based on SCr of 0.85 mg/dL).    Assessment: Sub-therapeutic heparin level after re-start s/p cath, plans for CABG, no issues per RN.   Goal of Therapy:  Heparin level 0.3-0.7 units/ml Monitor platelets by anticoagulation protocol: Yes   Plan:  Inc heparin to 1850 units/hr Heparin level with AM labs  Narda Bonds 02/18/2017,11:20 PM   =========================== Heparin level therapeutic x 1 this AM -Cont heparin 1850 units/hr -1200 HL Narda Bonds, PharmD, BCPS Clinical Pharmacist Phone: [phone] ============================

## 2017-02-18 NOTE — Progress Notes (Signed)
Inpatient Diabetes Program Recommendations  AACE/ADA: New Consensus Statement on Inpatient Glycemic Control (2015)  Target Ranges:  Prepandial:   less than 140 mg/dL      Peak postprandial:   less than 180 mg/dL (1-2 hours)      Critically ill patients:  140 - 180 mg/dL   Results for Theresa Ray, Theresa Ray (MRN [id number]) as of 02/18/2017 08:32  Ref. Range 02/17/2017 07:44 02/17/2017 12:28 02/17/2017 16:55 02/17/2017 21:18  Glucose-Capillary Latest Ref Range: 65 - 99 mg/dL 227 (H) 294 (H) 275 (H) 259 (H)   Results for Theresa Ray, Theresa Ray (MRN [id number]) as of 02/18/2017 08:32  Ref. Range 02/18/2017 06:36  Glucose-Capillary Latest Ref Range: 65 - 99 mg/dL 232 (H)    Home DM Meds: Januvia 50 mg daily                             Metformin 1000 mg BID                             Glipizide 10 mg BID  Current Insulin Orders: Novolog Sensitive Correction Scale/ SSI (0-9 units) TID AC + HS       MD- Please consider the following in-hospital insulin adjustments while patient's home oral DM meds are on hold:  1. Start Lantus 10 units daily (0.1 units/kg dosing)  2. Start Novolog Meal Coverage: Novolog 4 units TID with meals (hold if pt eats <50% of meal)      --Will follow patient during hospitalization--  Wyn Quaker RN, MSN, CDE Diabetes Coordinator Inpatient Glycemic Control Team Team Pager: [phone] (8a-5p)

## 2017-02-18 NOTE — Progress Notes (Signed)
ANTICOAGULATION CONSULT NOTE - F/u Consult  Pharmacy Consult for heparin Indication: chest pain/ACS  Allergies  Allergen Reactions  . Aggrenox [Aspirin-Dipyridamole Er] Other (See Comments)    Redness in eyes, takes aspirin 325 at home at baseline  . Shellfish Allergy Other (See Comments)    UNKNOWN REACTION. Allergy found on allergy test.    Patient Measurements: Height: 5' (152.4 cm) Weight: 211 lb 12.8 oz (96.1 kg) IBW/kg (Calculated) : 45.5 Heparin Dosing Weight: 68.9kg  Vital Signs: Temp: 99.3 F (37.4 C) (11/20 0330) Temp Source: Oral (11/20 0330) BP: 149/69 (11/20 0819) Pulse Rate: 0 (11/20 0824)  Labs: Recent Labs    02/16/17 1902 02/17/17 0042 02/17/17 0451 02/17/17 1234 02/17/17 2156 02/18/17 0213  HGB 12.2  --  12.3  --   --  12.2  HCT 35.8*  --  36.5  --   --  35.8*  PLT 356  --  370  --   --  339  APTT 39*  --   --   --   --   --   LABPROT 12.9  --  13.3  --   --   --   INR 0.98  --  1.02  --   --   --   HEPARINUNFRC <0.10*  --  0.20* <0.10* 0.51 0.62  CREATININE 0.91  --  0.87  --   --  0.85  TROPONINI 1.19* 1.14* 1.08*  --   --   --     Estimated Creatinine Clearance: 65.7 mL/min (by C-G formula based on SCr of 0.85 mg/dL).   Medical History: No past medical history on file.  Medications:  Infusions:  . sodium chloride    . sodium chloride      Assessment: 64 yof presented to OSH with CP.  Pharmacy consulted to dose heparin. Patient is now s/p cath with multivessel CAD and plans for CABG vs PCI. Pharmacy to restart heparin 8 hours post sheath removal (removed ~ 8:20am)  Goal of Therapy:  Heparin level 0.3-0.7 units/ml Monitor platelets by anticoagulation protocol: Yes   Plan:  Resume heparin at 1750 units/hr at 1630pm Heparin level in 6 hrs  Daily HL and CBC  Hildred Laser, Pharm D 02/18/2017 9:55 AM

## 2017-02-19 ENCOUNTER — Encounter (HOSPITAL_COMMUNITY): Payer: Self-pay

## 2017-02-19 ENCOUNTER — Encounter (HOSPITAL_COMMUNITY): Payer: Medicare HMO

## 2017-02-19 ENCOUNTER — Inpatient Hospital Stay (HOSPITAL_COMMUNITY): Payer: Medicare HMO

## 2017-02-19 DIAGNOSIS — I2510 Atherosclerotic heart disease of native coronary artery without angina pectoris: Secondary | ICD-10-CM

## 2017-02-19 DIAGNOSIS — E119 Type 2 diabetes mellitus without complications: Secondary | ICD-10-CM

## 2017-02-19 LAB — HEMOGLOBIN A1C
Hgb A1c MFr Bld: 9 % — ABNORMAL HIGH (ref 4.8–5.6)
Mean Plasma Glucose: 211.6 mg/dL

## 2017-02-19 LAB — PULMONARY FUNCTION TEST
DL/VA % pred: 98 %
DL/VA: 4.63 ml/min/mmHg/L
DLCO COR: 15.23 ml/min/mmHg
DLCO UNC % PRED: 64 %
DLCO cor % pred: 66 %
DLCO unc: 14.68 ml/min/mmHg
FEF 25-75 PRE: 2.06 L/s
FEF 25-75 Post: 1.47 L/sec
FEF2575-%Change-Post: -28 %
FEF2575-%PRED-PRE: 109 %
FEF2575-%Pred-Post: 77 %
FEV1-%Change-Post: -8 %
FEV1-%PRED-PRE: 73 %
FEV1-%Pred-Post: 67 %
FEV1-POST: 1.49 L
FEV1-PRE: 1.62 L
FEV1FVC-%Change-Post: -2 %
FEV1FVC-%Pred-Pre: 115 %
FEV6-%CHANGE-POST: -5 %
FEV6-%PRED-POST: 62 %
FEV6-%PRED-PRE: 66 %
FEV6-POST: 1.74 L
FEV6-PRE: 1.85 L
FEV6FVC-%PRED-PRE: 104 %
FEV6FVC-%Pred-Post: 104 %
FVC-%Change-Post: -5 %
FVC-%Pred-Post: 59 %
FVC-%Pred-Pre: 63 %
FVC-Post: 1.74 L
FVC-Pre: 1.85 L
POST FEV6/FVC RATIO: 100 %
Post FEV1/FVC ratio: 85 %
Pre FEV1/FVC ratio: 87 %
Pre FEV6/FVC Ratio: 100 %
RV % pred: 186 %
RV: 3.96 L
TLC % PRED: 120 %
TLC: 5.92 L

## 2017-02-19 LAB — BLOOD GAS, ARTERIAL
Acid-Base Excess: 0.8 mmol/L (ref 0.0–2.0)
Bicarbonate: 24.5 mmol/L (ref 20.0–28.0)
Drawn by: 520761
FIO2: 21
O2 Saturation: 92.1 %
Patient temperature: 98.6
pCO2 arterial: 36.5 mmHg (ref 32.0–48.0)
pH, Arterial: 7.441 (ref 7.350–7.450)
pO2, Arterial: 63.6 mmHg — ABNORMAL LOW (ref 83.0–108.0)

## 2017-02-19 LAB — COMPREHENSIVE METABOLIC PANEL
ALT: 20 U/L (ref 14–54)
AST: 19 U/L (ref 15–41)
Albumin: 3.3 g/dL — ABNORMAL LOW (ref 3.5–5.0)
Alkaline Phosphatase: 57 U/L (ref 38–126)
Anion gap: 11 (ref 5–15)
BUN: 9 mg/dL (ref 6–20)
CO2: 23 mmol/L (ref 22–32)
Calcium: 8.8 mg/dL — ABNORMAL LOW (ref 8.9–10.3)
Chloride: 102 mmol/L (ref 101–111)
Creatinine, Ser: 0.88 mg/dL (ref 0.44–1.00)
GFR calc Af Amer: >60 mL/min (ref >60)
GFR calc non Af Amer: >60 mL/min (ref >60)
Glucose, Bld: 219 mg/dL — ABNORMAL HIGH (ref 65–99)
Potassium: 3.6 mmol/L (ref 3.5–5.1)
Sodium: 136 mmol/L (ref 135–145)
Total Bilirubin: 0.7 mg/dL (ref 0.3–1.2)
Total Protein: 6.3 g/dL — ABNORMAL LOW (ref 6.5–8.1)

## 2017-02-19 LAB — HEPARIN LEVEL (UNFRACTIONATED)
HEPARIN UNFRACTIONATED: 0.45 [IU]/mL (ref 0.30–0.70)
Heparin Unfractionated: 0.5 IU/mL (ref 0.30–0.70)

## 2017-02-19 LAB — CBC
HCT: 36 % (ref 36.0–46.0)
Hemoglobin: 12.3 g/dL (ref 12.0–15.0)
MCH: 30.6 pg (ref 26.0–34.0)
MCHC: 34.2 g/dL (ref 30.0–36.0)
MCV: 89.6 fL (ref 78.0–100.0)
PLATELETS: 336 10*3/uL (ref 150–400)
RBC: 4.02 MIL/uL (ref 3.87–5.11)
RDW: 12.5 % (ref 11.5–15.5)
WBC: 9.4 10*3/uL (ref 4.0–10.5)

## 2017-02-19 LAB — APTT: aPTT: 61 seconds — ABNORMAL HIGH (ref 24–36)

## 2017-02-19 LAB — URINALYSIS, ROUTINE W REFLEX MICROSCOPIC
Bilirubin Urine: NEGATIVE
Glucose, UA: 150 mg/dL — AB
Hgb urine dipstick: NEGATIVE
Ketones, ur: NEGATIVE mg/dL
Leukocytes, UA: NEGATIVE
Nitrite: NEGATIVE
Protein, ur: NEGATIVE mg/dL
Specific Gravity, Urine: 1.01 (ref 1.005–1.030)
pH: 7 (ref 5.0–8.0)

## 2017-02-19 LAB — GLUCOSE, CAPILLARY
GLUCOSE-CAPILLARY: 277 mg/dL — AB (ref 65–99)
GLUCOSE-CAPILLARY: 246 mg/dL — AB (ref 65–99)
GLUCOSE-CAPILLARY: 217 mg/dL — AB (ref 65–99)
Glucose-Capillary: 278 mg/dL — ABNORMAL HIGH (ref 65–99)

## 2017-02-19 LAB — SURGICAL PCR SCREEN
MRSA, PCR: NEGATIVE
Staphylococcus aureus: NEGATIVE

## 2017-02-19 LAB — TYPE AND SCREEN
ABO/RH(D): B NEG
Antibody Screen: NEGATIVE

## 2017-02-19 LAB — PROTIME-INR
INR: 1
Prothrombin Time: 13.1 seconds (ref 11.4–15.2)

## 2017-02-19 MED ORDER — HYDRALAZINE HCL 20 MG/ML IJ SOLN: 10.0000 mg | Freq: Four times a day (QID) | INTRAMUSCULAR | Status: DC | PRN

## 2017-02-19 MED ORDER — INSULIN GLARGINE 100 UNIT/ML ~~LOC~~ SOLN
10.0000 [IU] | Freq: Every day | SUBCUTANEOUS | Status: DC
  Administered 2017-02-19: 10 [IU] via SUBCUTANEOUS
  Filled 2017-02-19 (×2): qty 0.1

## 2017-02-19 MED ORDER — INSULIN ASPART 100 UNIT/ML ~~LOC~~ SOLN
4.0000 [IU] | Freq: Three times a day (TID) | SUBCUTANEOUS | Status: DC
  Administered 2017-02-19 – 2017-02-23 (×14): 4 [IU] via SUBCUTANEOUS

## 2017-02-19 MED ORDER — ALBUTEROL SULFATE (2.5 MG/3ML) 0.083% IN NEBU
2.5000 mg | INHALATION_SOLUTION | Freq: Once | RESPIRATORY_TRACT | Status: AC
  Administered 2017-02-19: 2.5 mg via RESPIRATORY_TRACT

## 2017-02-19 NOTE — Progress Notes (Signed)
Patient ID: Theresa Ray, female   DOB: 1945/10/26, 71 y.o.   MRN: [id number]      [address]       [place] [address]             [phone]                 1 Day Post-Op Procedure(s) (LRB): LEFT HEART CATH AND CORONARY ANGIOGRAPHY (N/A)  LOS: 3 days   Subjective: Just back from PFT's No chest pain  joint discomfort since off Celebrex prior to surgery   Objective: Vital signs in last 24 hours: Patient Vitals for the past 24 hrs:  BP Temp Temp src Pulse Resp SpO2 Weight  02/19/17 0703 (!) 142/79 98.3 F (36.8 C) Oral 74 14 95 % -  02/19/17 0409 (!) 147/69 97.8 F (36.6 C) Oral - 15 98 % 211 lb 14.4 oz (96.1 kg)  02/19/17 0002 (!) 144/72 98.2 F (36.8 C) Oral 67 16 98 % -  02/18/17 1936 (!) 153/74 98.4 F (36.9 C) Oral 75 15 97 % -  02/18/17 1613 135/63 98.8 F (37.1 C) Oral 77 - 97 % -  02/18/17 1120 136/73 98 F (36.7 C) Oral 71 19 95 % -    Filed Weights   02/17/17 0612 02/18/17 0330 02/19/17 0409  Weight: 212 lb 14.4 oz (96.6 kg) 211 lb 12.8 oz (96.1 kg) 211 lb 14.4 oz (96.1 kg)    Hemodynamic parameters for last 24 hours:    Intake/Output from previous day: 11/20 0701 - 11/21 0700 In: 1108.1 [P.O.:960; I.V.:148.1] Out: -  Intake/Output this shift: No intake/output data recorded.  Scheduled Meds: . amLODipine  10 mg Oral Daily  . aspirin EC  81 mg Oral Daily  . atorvastatin  80 mg Oral q1800  . carvedilol  25 mg Oral BID WC  . cloNIDine  0.2 mg Oral QHS  . gabapentin  100 mg Oral TID  . insulin aspart  0-5 Units Subcutaneous QHS  . insulin aspart  0-9 Units Subcutaneous TID WC  . insulin aspart  4 Units Subcutaneous TID WC  . insulin glargine  10 Units Subcutaneous Daily  . levothyroxine  150 mcg Oral QAC breakfast  . sodium chloride flush  3 mL Intravenous Q12H   Continuous Infusions: . sodium chloride    . heparin 1,850 Units/hr (02/19/17 0549)   PRN Meds:.sodium chloride, acetaminophen, Melatonin, nitroGLYCERIN,  ondansetron (ZOFRAN) IV, sodium chloride flush  General appearance: alert and cooperative Neurologic: intact Heart: regular rate and rhythm, S1, S2 normal, no murmur, click, rub or gallop Lungs: clear to auscultation bilaterally Abdomen: soft, non-tender; bowel sounds normal; no masses,  no organomegaly Extremities: extremities normal, atraumatic, no cyanosis or edema and Homans sign is negative, no sign of DVT  Lab Results: CBC: Recent Labs    02/18/17 0213 02/19/17 0349  WBC 9.7 9.4  HGB 12.2 12.3  HCT 35.8* 36.0  PLT 339 336   BMET:  Recent Labs    02/17/17 0451 02/18/17 0213  NA 138 137  K 3.6 3.6  CL 103 103  CO2 25 24  GLUCOSE 226* 227*  BUN 10 11  CREATININE 0.87 0.85  CALCIUM 8.8* 8.7*    PT/INR:  Recent Labs    02/17/17 0451  LABPROT 13.3  INR 1.02     Radiology No results found.   Assessment/Plan: S/P Procedure(s) (LRB): LEFT HEART CATH AND CORONARY ANGIOGRAPHY (N/A) Diabetes control- poorly controlled in hospital, HgB1 8.8-  long acting insulin added and meal coverage   Plan cabg Monday- patient or husband had no further questions Carotid dopplers still pending  preop orders entered   Grace Isaac MD 02/19/2017 11:15 AM

## 2017-02-19 NOTE — Progress Notes (Signed)
Inpatient Diabetes Program Recommendations  AACE/ADA: New Consensus Statement on Inpatient Glycemic Control (2015)  Target Ranges:  Prepandial:   less than 140 mg/dL      Peak postprandial:   less than 180 mg/dL (1-2 hours)      Critically ill patients:  140 - 180 mg/dL   Results for STEVANA, DUFNER (MRN [id number]) as of 02/19/2017 08:12  Ref. Range 02/18/2017 06:36 02/18/2017 11:18 02/18/2017 16:08 02/18/2017 21:35  Glucose-Capillary Latest Ref Range: 65 - 99 mg/dL 232 (H) 317 (H) 210 (H) 248 (H)   Results for DEANDA, RUDDELL (MRN [id number]) as of 02/19/2017 08:12  Ref. Range 02/19/2017 07:46  Glucose-Capillary Latest Ref Range: 65 - 99 mg/dL 277 (H)    Home DM Meds:Januvia 50 mg daily Metformin 1000 mg BID Glipizide 10 mg BID  Current Insulin Orders:Novolog Sensitive Correction Scale/ SSI (0-9 units) TID AC + HS       MD- Please consider the following in-hospital insulin adjustments while patient's home oral DM meds are on hold:  1. Start Lantus 10 units daily (0.1 units/kg dosing)  2. Start Novolog Meal Coverage: Novolog 4 units TID with meals(hold if pt eats <50% of meal)      --Will follow patient during hospitalization--  Wyn Quaker RN, MSN, CDE Diabetes Coordinator Inpatient Glycemic Control Team Team Pager: [phone] (8a-5p)

## 2017-02-19 NOTE — Plan of Care (Signed)
Patient denies anxiety 

## 2017-02-19 NOTE — Progress Notes (Signed)
CARDIAC REHAB PHASE I   Pt in bed. Pre-Op Ed completed with pt. Reviewed sternal precautions, IS, Cardiac Surgery Booklet, Preparing for OSH video, care after discharge. Pt stated she will watch video when family comes to visit tomorrow. Pt verbalized understanding of ed. Pt became very emotional about surgery and is anxious. Also missing being home with family during the holidays. Provided pt with emotional support. Encouraged pt to ask as many questions as she needed prior to surgery on Monday. Pt asked for more ice for her drink, provided pt with ice. Call bell within reach.   [phone]  Carma Lair MS, ACSM CEP  2:15 PM 02/19/2017

## 2017-02-19 NOTE — Progress Notes (Signed)
ANTICOAGULATION CONSULT NOTE - Follow Up Consult  Pharmacy Consult for heparin Indication: chest pain/ACS  Allergies  Allergen Reactions  . Aggrenox [Aspirin-Dipyridamole Er] Other (See Comments)    Redness in eyes, takes aspirin 325 at home at baseline  . Shellfish Allergy Other (See Comments)    UNKNOWN REACTION. Allergy found on allergy test.    Patient Measurements: Height: 5' (152.4 cm) Weight: 211 lb 14.4 oz (96.1 kg) IBW/kg (Calculated) : 45.5 Heparin Dosing Weight: 68.9kg  Vital Signs: Temp: 98.5 F (36.9 C) (11/21 1100) Temp Source: Oral (11/21 1100) BP: 141/70 (11/21 1100) Pulse Rate: 69 (11/21 1100)  Labs: Recent Labs    02/16/17 1902 02/17/17 0042 02/17/17 0451  02/18/17 0213 02/18/17 2222 02/19/17 0349 02/19/17 1148  HGB 12.2  --  12.3  --  12.2  --  12.3  --   HCT 35.8*  --  36.5  --  35.8*  --  36.0  --   PLT 356  --  370  --  339  --  336  --   APTT 39*  --   --   --   --   --   --   --   LABPROT 12.9  --  13.3  --   --   --   --   --   INR 0.98  --  1.02  --   --   --   --   --   HEPARINUNFRC <0.10*  --  0.20*   < > 0.62 0.22* 0.45 0.50  CREATININE 0.91  --  0.87  --  0.85  --   --   --   TROPONINI 1.19* 1.14* 1.08*  --   --   --   --   --    < > = values in this interval not displayed.    Estimated Creatinine Clearance: 65.7 mL/min (by C-G formula based on SCr of 0.85 mg/dL).   Medical History: No past medical history on file.  Medications:  Infusions:  . sodium chloride    . heparin 1,850 Units/hr (02/19/17 0549)    Assessment: 76 yof presented to OSH with CP.  Pharmacy consulted to dose heparin. Patient is now s/p cath with multivessel CAD and plans for CABG Monday. Heparin drip restarted after cath drip rate 1850 uts/hr HL 0.5 at goal.  CBC stable.    Goal of Therapy:  Heparin level 0.3-0.7 units/ml Monitor platelets by anticoagulation protocol: Yes   Plan:  Continue heparin at 1850 units/hr  Daily HL and CBC  Bonnita Nasuti  Pharm.D. CPP, BCPS Clinical Pharmacist [phone] 02/19/2017 12:43 PM

## 2017-02-19 NOTE — Progress Notes (Signed)
Progress Note  Patient Name: Theresa Ray Date of Encounter: 02/19/2017  Primary Cardiologist: Angelena Form  Subjective   No chest pain. No dyspnea  Inpatient Medications    Scheduled Meds: . amLODipine  10 mg Oral Daily  . aspirin EC  81 mg Oral Daily  . atorvastatin  80 mg Oral q1800  . carvedilol  25 mg Oral BID WC  . cloNIDine  0.2 mg Oral QHS  . gabapentin  100 mg Oral TID  . insulin aspart  0-5 Units Subcutaneous QHS  . insulin aspart  0-9 Units Subcutaneous TID WC  . insulin aspart  4 Units Subcutaneous TID WC  . insulin glargine  10 Units Subcutaneous Daily  . levothyroxine  150 mcg Oral QAC breakfast  . sodium chloride flush  3 mL Intravenous Q12H   Continuous Infusions: . sodium chloride    . heparin 1,850 Units/hr (02/19/17 1249)   PRN Meds: sodium chloride, acetaminophen, Melatonin, nitroGLYCERIN, ondansetron (ZOFRAN) IV, sodium chloride flush   Vital Signs    Vitals:   02/19/17 0800 02/19/17 0900 02/19/17 1000 02/19/17 1100  BP:    (!) 141/70  Pulse:    69  Resp: 16 20 16 16   Temp:    98.5 F (36.9 C)  TempSrc:    Oral  SpO2:    97%  Weight:      Height:        Intake/Output Summary (Last 24 hours) at 02/19/2017 1509 Last data filed at 02/19/2017 1100 Gross per 24 hour  Intake 963.96 ml  Output -  Net 963.96 ml   Filed Weights   02/17/17 0612 02/18/17 0330 02/19/17 0409  Weight: 212 lb 14.4 oz (96.6 kg) 211 lb 12.8 oz (96.1 kg) 211 lb 14.4 oz (96.1 kg)    Telemetry    sinus - Personally Reviewed  ECG      Physical Exam   General: Well developed, well nourished, NAD  HEENT: OP clear, mucus membranes moist  SKIN: warm, dry. No rashes. Neuro: No focal deficits  Musculoskeletal: Muscle strength 5/5 all ext  Psychiatric: Mood and affect normal  Neck: No JVD, no carotid bruits, no thyromegaly, no lymphadenopathy.  Lungs:Clear bilaterally, no wheezes, rhonci, crackles Cardiovascular: Regular rate and rhythm. No murmurs, gallops or  rubs. Abdomen:Soft. Bowel sounds present. Non-tender.  Extremities: No lower extremity edema. Pulses are 2 + in the bilateral DP/PT.   Labs    Chemistry Recent Labs  Lab 02/16/17 1902 02/17/17 0451 02/18/17 0213  NA 136 138 137  K 3.7 3.6 3.6  CL 101 103 103  CO2 22 25 24   GLUCOSE 181* 226* 227*  BUN 13 10 11   CREATININE 0.91 0.87 0.85  CALCIUM 8.5* 8.8* 8.7*  GFRNONAA >60 >60 >60  GFRAA >60 >60 >60  ANIONGAP 13 10 10      Hematology Recent Labs  Lab 02/17/17 0451 02/18/17 0213 02/19/17 0349  WBC 10.5 9.7 9.4  RBC 4.08 4.02 4.02  HGB 12.3 12.2 12.3  HCT 36.5 35.8* 36.0  MCV 89.5 89.1 89.6  MCH 30.1 30.3 30.6  MCHC 33.7 34.1 34.2  RDW 12.4 12.2 12.5  PLT 370 339 336    Cardiac Enzymes Recent Labs  Lab 02/16/17 1902 02/17/17 0042 02/17/17 0451  TROPONINI 1.19* 1.14* 1.08*   No results for input(s): TROPIPOC in the last 168 hours.   BNPNo results for input(s): BNP, PROBNP in the last 168 hours.   DDimer No results for input(s): DDIMER in the last 168  hours.   Radiology    No results found.  Cardiac Studies   Cardiac cath 02/18/17:  Prox LAD lesion is 70% stenosed -at Diag 1. Dist LAD lesion is 99% stenosed.  Ost 1st Diag lesion is 85% stenosed. Very small caliber vessel  Ramus Distal lesion is 100% stenosed.  Prox RCA lesion is 75% stenosed. Mid RCA to Dist RCA lesion is 100% stenosed.--The R PDA and PL systems fill via left-to-right collaterals with brisk flow.  Prox Cx lesion is 50% stenosed -proximal to OM1  Ost 1st Mrg lesion is 100% stenosed. Ostial occlusion. The vessel fills retrograde via left to left collaterals  Dist Cx lesion is 75% stenosed -in the band prior to small left posterolateral branch  There is mild left ventricular systolic dysfunction. The left ventricular ejection fraction is 50-55% by visual estimate.  LV end diastolic pressure is moderately elevated.  Diagnostic Diagram      Echo 02/17/17: - Left ventricle:  The cavity size was normal. Systolic function was   normal. The estimated ejection fraction was in the range of 55%   to 60%. Probable hypokinesis of the basalinferior and   inferoseptal myocardium; consistent with ischemia in the   distribution of the right coronary artery. Features are   consistent with a pseudonormal left ventricular filling pattern,   with concomitant abnormal relaxation and increased filling   pressure (grade 2 diastolic dysfunction). - Mitral valve: There was mild regurgitation.  Patient Profile     71 y.o. female with history of DM, HTN admitted with chest pain and found to have a NSTEMI. 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 LV systolic function is normal.   Assessment & Plan    1. 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 LVEF normal by echo. She has been seen by CT surgery and planning underway for CABG next week. She is having no chest pain. Will continue ASA, statin and beta blocker.   2. DM: Continue basal insulin with SSI  For questions or updates, please contact [place] Please consult [email or web address] for contact info under Cardiology/STEMI.      Signed, Lauree Chandler, MD  02/19/2017, 3:09 PM

## 2017-02-20 ENCOUNTER — Encounter (HOSPITAL_COMMUNITY): Payer: Self-pay | Admitting: General Practice

## 2017-02-20 LAB — CBC
HCT: 35.1 % — ABNORMAL LOW (ref 36.0–46.0)
Hemoglobin: 12 g/dL (ref 12.0–15.0)
MCH: 30.6 pg (ref 26.0–34.0)
MCHC: 34.2 g/dL (ref 30.0–36.0)
MCV: 89.5 fL (ref 78.0–100.0)
PLATELETS: 347 10*3/uL (ref 150–400)
RBC: 3.92 MIL/uL (ref 3.87–5.11)
RDW: 12.5 % (ref 11.5–15.5)
WBC: 10.2 10*3/uL (ref 4.0–10.5)

## 2017-02-20 LAB — GLUCOSE, CAPILLARY
GLUCOSE-CAPILLARY: 258 mg/dL — AB (ref 65–99)
GLUCOSE-CAPILLARY: 253 mg/dL — AB (ref 65–99)
Glucose-Capillary: 227 mg/dL — ABNORMAL HIGH (ref 65–99)
Glucose-Capillary: 285 mg/dL — ABNORMAL HIGH (ref 65–99)

## 2017-02-20 LAB — BASIC METABOLIC PANEL
Anion gap: 8 (ref 5–15)
BUN: 8 mg/dL (ref 6–20)
CO2: 25 mmol/L (ref 22–32)
Calcium: 8.8 mg/dL — ABNORMAL LOW (ref 8.9–10.3)
Chloride: 103 mmol/L (ref 101–111)
Creatinine, Ser: 0.8 mg/dL (ref 0.44–1.00)
GFR calc Af Amer: >60 mL/min (ref >60)
GFR calc non Af Amer: >60 mL/min (ref >60)
Glucose, Bld: 219 mg/dL — ABNORMAL HIGH (ref 65–99)
Potassium: 3.6 mmol/L (ref 3.5–5.1)
Sodium: 136 mmol/L (ref 135–145)

## 2017-02-20 LAB — ABO/RH: ABO/RH(D): B NEG

## 2017-02-20 LAB — HEPARIN LEVEL (UNFRACTIONATED): Heparin Unfractionated: 0.36 IU/mL (ref 0.30–0.70)

## 2017-02-20 MED ORDER — POTASSIUM CHLORIDE CRYS ER 20 MEQ PO TBCR
40.0000 meq | EXTENDED_RELEASE_TABLET | Freq: Once | ORAL | Status: AC
  Administered 2017-02-20: 40 meq via ORAL
  Filled 2017-02-20: qty 2

## 2017-02-20 MED ORDER — INSULIN GLARGINE 100 UNIT/ML ~~LOC~~ SOLN
20.0000 [IU] | Freq: Every day | SUBCUTANEOUS | Status: DC
  Administered 2017-02-20 – 2017-02-23 (×4): 20 [IU] via SUBCUTANEOUS
  Filled 2017-02-20 (×5): qty 0.2

## 2017-02-20 NOTE — Progress Notes (Signed)
Progress Note  Patient Name: ANESSA CHARLEY Date of Encounter: 02/20/2017  Primary Cardiologist: Angelena Form  Subjective   No chest pain or dyspnea  Inpatient Medications    Scheduled Meds: . amLODipine  10 mg Oral Daily  . aspirin EC  81 mg Oral Daily  . atorvastatin  80 mg Oral q1800  . carvedilol  25 mg Oral BID WC  . cloNIDine  0.2 mg Oral QHS  . gabapentin  100 mg Oral TID  . insulin aspart  0-5 Units Subcutaneous QHS  . insulin aspart  0-9 Units Subcutaneous TID WC  . insulin aspart  4 Units Subcutaneous TID WC  . insulin glargine  10 Units Subcutaneous Daily  . levothyroxine  150 mcg Oral QAC breakfast  . sodium chloride flush  3 mL Intravenous Q12H   Continuous Infusions: . sodium chloride    . heparin 1,850 Units/hr (02/20/17 0551)   PRN Meds: sodium chloride, acetaminophen, hydrALAZINE, Melatonin, nitroGLYCERIN, ondansetron (ZOFRAN) IV, sodium chloride flush   Vital Signs    Vitals:   02/19/17 1500 02/19/17 2059 02/20/17 0020 02/20/17 0325  BP: (!) 155/85 (!) 156/79 (!) 142/67 (!) 131/58  Pulse: 67 78 70 67  Resp: 18 17 20 17   Temp: 98.5 F (36.9 C) 98.5 F (36.9 C) (!) 97.5 F (36.4 C) 98 F (36.7 C)  TempSrc: Oral Oral Other (Comment) Oral  SpO2: 98% 95% 96% 97%  Weight:    209 lb 6.4 oz (95 kg)  Height:        Intake/Output Summary (Last 24 hours) at 02/20/2017 0706 Last data filed at 02/20/2017 0600 Gross per 24 hour  Intake 1284.62 ml  Output 2150 ml  Net -865.38 ml   Filed Weights   02/18/17 0330 02/19/17 0409 02/20/17 0325  Weight: 211 lb 12.8 oz (96.1 kg) 211 lb 14.4 oz (96.1 kg) 209 lb 6.4 oz (95 kg)    Telemetry    Sinus - Personally Reviewed  ECG    No am EKG  Physical Exam    General: Well developed, well nourished, NAD  HEENT: OP clear, mucus membranes moist  SKIN: warm, dry. No rashes. Neuro: No focal deficits  Musculoskeletal: Muscle strength 5/5 all ext  Psychiatric: Mood and affect normal  Neck: No JVD, no  carotid bruits, no thyromegaly, no lymphadenopathy.  Lungs:Clear bilaterally, no wheezes, rhonci, crackles Cardiovascular: Regular rate and rhythm. No murmurs, gallops or rubs. Abdomen:Soft. Bowel sounds present. Non-tender.  Extremities: No lower extremity edema. Pulses are 2 + in the bilateral DP/PT.   Labs    Chemistry Recent Labs  Lab 02/18/17 0213 02/19/17 2206 02/20/17 0310  NA 137 136 136  K 3.6 3.6 3.6  CL 103 102 103  CO2 24 23 25   GLUCOSE 227* 219* 219*  BUN 11 9 8   CREATININE 0.85 0.88 0.80  CALCIUM 8.7* 8.8* 8.8*  PROT  --  6.3*  --   ALBUMIN  --  3.3*  --   AST  --  19  --   ALT  --  20  --   ALKPHOS  --  57  --   BILITOT  --  0.7  --   GFRNONAA >60 >60 >60  GFRAA >60 >60 >60  ANIONGAP 10 11 8      Hematology Recent Labs  Lab 02/18/17 0213 02/19/17 0349 02/20/17 0310  WBC 9.7 9.4 10.2  RBC 4.02 4.02 3.92  HGB 12.2 12.3 12.0  HCT 35.8* 36.0 35.1*  MCV 89.1 89.6  89.5  MCH 30.3 30.6 30.6  MCHC 34.1 34.2 34.2  RDW 12.2 12.5 12.5  PLT 339 336 347    Cardiac Enzymes Recent Labs  Lab 02/16/17 1902 02/17/17 0042 02/17/17 0451  TROPONINI 1.19* 1.14* 1.08*   No results for input(s): TROPIPOC in the last 168 hours.   BNPNo results for input(s): BNP, PROBNP in the last 168 hours.   DDimer No results for input(s): DDIMER in the last 168 hours.   Radiology    No results found.  Cardiac Studies   Cardiac cath 02/18/17:  Prox LAD lesion is 70% stenosed -at Diag 1. Dist LAD lesion is 99% stenosed.  Ost 1st Diag lesion is 85% stenosed. Very small caliber vessel  Ramus Distal lesion is 100% stenosed.  Prox RCA lesion is 75% stenosed. Mid RCA to Dist RCA lesion is 100% stenosed.--The R PDA and PL systems fill via left-to-right collaterals with brisk flow.  Prox Cx lesion is 50% stenosed -proximal to OM1  Ost 1st Mrg lesion is 100% stenosed. Ostial occlusion. The vessel fills retrograde via left to left collaterals  Dist Cx lesion is 75%  stenosed -in the band prior to small left posterolateral branch  There is mild left ventricular systolic dysfunction. The left ventricular ejection fraction is 50-55% by visual estimate.  LV end diastolic pressure is moderately elevated.  Diagnostic Diagram      Echo 02/17/17: - Left ventricle: The cavity size was normal. Systolic function was   normal. The estimated ejection fraction was in the range of 55%   to 60%. Probable hypokinesis of the basalinferior and   inferoseptal myocardium; consistent with ischemia in the   distribution of the right coronary artery. Features are   consistent with a pseudonormal left ventricular filling pattern,   with concomitant abnormal relaxation and increased filling   pressure (grade 2 diastolic dysfunction). - Mitral valve: There was mild regurgitation.  Patient Profile     71 y.o. female with history of DM, HTN admitted with chest pain and found to have a NSTEMI. 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 LV systolic function is normal.   Assessment & Plan    1. 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 LVEF normal by echo.  She has been seen by CT surgery and planning underway for CABG next week.  -No chest pain today. BP is stable. Will continue ASA, beta blocker and statin.  -Planning underway for CABG next week.    2. DM: Continue basal insulin with SSI. Blood sugars all over 200. Will increase Lantus to 20 units today.   3. Hypokalemia: Replace potassium today  For questions or updates, please contact [place] Please consult [email or web address] for contact info under Cardiology/STEMI.      Signed, Lauree Chandler, MD  02/20/2017, 7:06 AM

## 2017-02-20 NOTE — Plan of Care (Signed)
Pt up ad lib. Ambulates independently after setup. Denies weakness or shortness of breath. Educated on use of call light system. Verbalizes importance of utilizing call light to call for assistance when necessary

## 2017-02-20 NOTE — Progress Notes (Signed)
ANTICOAGULATION CONSULT NOTE - Follow Up Consult  Pharmacy Consult for heparin Indication: chest pain/ACS  Allergies  Allergen Reactions  . Aggrenox [Aspirin-Dipyridamole Er] Other (See Comments)    Redness in eyes, takes aspirin 325 at home at baseline  . Shellfish Allergy Other (See Comments)    UNKNOWN REACTION. Allergy found on allergy test.    Patient Measurements: Height: 5' (152.4 cm) Weight: 209 lb 6.4 oz (95 kg) IBW/kg (Calculated) : 45.5 Heparin Dosing Weight: 68.9kg  Vital Signs: Temp: 98.5 F (36.9 C) (11/22 0800) Temp Source: Oral (11/22 0800) BP: 148/77 (11/22 0800) Pulse Rate: 68 (11/22 0800)  Labs: Recent Labs    02/18/17 0213  02/19/17 0349 02/19/17 1148 02/19/17 2206 02/20/17 0310  HGB 12.2  --  12.3  --   --  12.0  HCT 35.8*  --  36.0  --   --  35.1*  PLT 339  --  336  --   --  347  APTT  --   --   --   --  61*  --   LABPROT  --   --   --   --  13.1  --   INR  --   --   --   --  1.00  --   HEPARINUNFRC 0.62   < > 0.45 0.50  --  0.36  CREATININE 0.85  --   --   --  0.88 0.80   < > = values in this interval not displayed.    Estimated Creatinine Clearance: 69.4 mL/min (by C-G formula based on SCr of 0.8 mg/dL).   Medical History: Past Medical History:  Diagnosis Date  . Diabetes mellitus without complication ([place])   . History of kidney stones    history of stones  . Hypertension   . Hypothyroidism   . NSTEMI (non-ST elevated myocardial infarction) ([place])   . TIA (transient ischemic attack)     Medications:  Infusions:  . sodium chloride    . heparin 1,850 Units/hr (02/20/17 0551)    Assessment: 6 yof presented to OSH with CP.  Pharmacy consulted to dose heparin. Patient is now s/p cath with multivessel CAD and plans for CABG Monday.  Heparin drip restarted after cath, drip rate 1850 uts/hr HL 0.36 at goal.  CBC stable.    Goal of Therapy:  Heparin level 0.3-0.7 units/ml Monitor platelets by anticoagulation protocol: Yes    Plan:  Continue heparin at 1850 units/hr  Daily HL and [place], PharmD Pharmacy Resident [phone] 02/20/2017 9:56 AM

## 2017-02-21 ENCOUNTER — Inpatient Hospital Stay (HOSPITAL_COMMUNITY): Payer: Medicare HMO

## 2017-02-21 DIAGNOSIS — E876 Hypokalemia: Secondary | ICD-10-CM

## 2017-02-21 DIAGNOSIS — Z01810 Encounter for preprocedural cardiovascular examination: Secondary | ICD-10-CM

## 2017-02-21 LAB — BASIC METABOLIC PANEL
Anion gap: 8 (ref 5–15)
BUN: 9 mg/dL (ref 6–20)
CHLORIDE: 107 mmol/L (ref 101–111)
CO2: 23 mmol/L (ref 22–32)
CREATININE: 0.84 mg/dL (ref 0.44–1.00)
Calcium: 8.8 mg/dL — ABNORMAL LOW (ref 8.9–10.3)
GFR calc non Af Amer: >60 mL/min (ref >60)
Glucose, Bld: 224 mg/dL — ABNORMAL HIGH (ref 65–99)
Potassium: 3.7 mmol/L (ref 3.5–5.1)
Sodium: 138 mmol/L (ref 135–145)

## 2017-02-21 LAB — CBC
HEMATOCRIT: 35.7 % — AB (ref 36.0–46.0)
Hemoglobin: 12.1 g/dL (ref 12.0–15.0)
MCH: 30 pg (ref 26.0–34.0)
MCHC: 33.9 g/dL (ref 30.0–36.0)
MCV: 88.6 fL (ref 78.0–100.0)
Platelets: 341 10*3/uL (ref 150–400)
RBC: 4.03 MIL/uL (ref 3.87–5.11)
RDW: 12.5 % (ref 11.5–15.5)
WBC: 9.5 10*3/uL (ref 4.0–10.5)

## 2017-02-21 LAB — GLUCOSE, CAPILLARY
GLUCOSE-CAPILLARY: 190 mg/dL — AB (ref 65–99)
GLUCOSE-CAPILLARY: 218 mg/dL — AB (ref 65–99)
GLUCOSE-CAPILLARY: 210 mg/dL — AB (ref 65–99)
Glucose-Capillary: 213 mg/dL — ABNORMAL HIGH (ref 65–99)
Glucose-Capillary: 227 mg/dL — ABNORMAL HIGH (ref 65–99)

## 2017-02-21 LAB — HEPARIN LEVEL (UNFRACTIONATED): Heparin Unfractionated: 0.5 IU/mL (ref 0.30–0.70)

## 2017-02-21 MED ORDER — POTASSIUM CHLORIDE CRYS ER 20 MEQ PO TBCR
40.0000 meq | EXTENDED_RELEASE_TABLET | Freq: Once | ORAL | Status: AC
  Administered 2017-02-21: 40 meq via ORAL
  Filled 2017-02-21: qty 2

## 2017-02-21 MED ORDER — MILRINONE LACTATE IN DEXTROSE 20-5 MG/100ML-% IV SOLN
0.1250 ug/kg/min | INTRAVENOUS | Status: DC
  Filled 2017-02-21 (×2): qty 100

## 2017-02-21 MED ORDER — POLYETHYLENE GLYCOL 3350 17 G PO PACK
17.0000 g | PACK | Freq: Every day | ORAL | Status: DC | PRN
  Administered 2017-02-21 – 2017-02-22 (×2): 17 g via ORAL
  Filled 2017-02-21 (×2): qty 1

## 2017-02-21 NOTE — Plan of Care (Signed)
Pt. Ambulated in hallway today X2. Endorses fatigue, shortness of breath with ambulation.

## 2017-02-21 NOTE — Progress Notes (Signed)
Pre-CABG testing has been completed.  02/21/17 1:12 PM Carlos Levering RVT

## 2017-02-21 NOTE — Plan of Care (Signed)
Pt denies c/o pain, SOB, or other sx; VSS. Currently SB on monitor, HR 55. No acute issues overnight.

## 2017-02-21 NOTE — Progress Notes (Signed)
CARDIAC REHAB PHASE I   PRE:  Rate/Rhythm: 50 SR  BP:  Supine:   Sitting: 129/80  Standing:    SaO2: 98 RA  MODE:  Ambulation: 470 ft   POST:  Rate/Rhythm: 71 SR  BP:  Supine:   Sitting: 143/57  Standing:    SaO2: 98 RA 1515-1550  Assisted x 1 to ambulate walked in her tennis shoes. Gait steady. Pt c/o of back pain with walking and took one sitting rest stop. The back pain is chronic. VS stable. Pt back to bed after walk with call light in reach. Pt emotional about surgery given support.  Rodney Langton RN 02/21/2017 3:58 PM

## 2017-02-21 NOTE — Progress Notes (Addendum)
Inpatient Diabetes Program Recommendations  AACE/ADA: New Consensus Statement on Inpatient Glycemic Control (2015)  Target Ranges:  Prepandial:   less than 140 mg/dL      Peak postprandial:   less than 180 mg/dL (1-2 hours)      Critically ill patients:  140 - 180 mg/dL   Lab Results  Component Value Date   GLUCAP 227 (H) 02/21/2017   HGBA1C 9.0 (H) 02/19/2017    Review of Glycemic ControlResults for Theresa Ray, Theresa Ray (MRN [id number]) as of 02/21/2017 13:15  Ref. Range 02/20/2017 11:12 02/20/2017 16:28 02/20/2017 21:22 02/21/2017 07:51 02/21/2017 11:25  Glucose-Capillary Latest Ref Range: 65 - 99 mg/dL 253 (H) 227 (H) 285 (H) 213 (H) 227 (H)    Diabetes history: Type 2 DM Outpatient Diabetes medications: Januvia 50 mg daily, Metformin 1000 mg bid, Glipizide 10 mg bid Current orders for Inpatient glycemic control:  Novolog sensitive tid with meals and HS, Lantus 20 units daily, Novolog 4 units tid with meals  Inpatient Diabetes Program Recommendations:     Please consider increasing Lantus to 25 units daily.   Spoke with patient regarding A1C and the importance of glucose control after surgery.  I explained that she may need insulin at discharge, however that decision will be made after surgery.  Patient was tearful about surgery.  We briefly discussed survival skills of DM and the importance of F/U with PCP. Will order Living well with diabetes booklet as well.  Will follow patient after surgery regarding DM teaching needs.    Thanks, Adah Perl, RN, BC-ADM Inpatient Diabetes Coordinator Pager [phone] (8a-5p)

## 2017-02-21 NOTE — Progress Notes (Signed)
Progress Note  Patient Name: Theresa Ray Date of Encounter: 02/21/2017  Primary Cardiologist: Angelena Form  Subjective   No dyspnea or chest pain.   Inpatient Medications    Scheduled Meds: . amLODipine  10 mg Oral Daily  . aspirin EC  81 mg Oral Daily  . atorvastatin  80 mg Oral q1800  . carvedilol  25 mg Oral BID WC  . cloNIDine  0.2 mg Oral QHS  . gabapentin  100 mg Oral TID  . insulin aspart  0-5 Units Subcutaneous QHS  . insulin aspart  0-9 Units Subcutaneous TID WC  . insulin aspart  4 Units Subcutaneous TID WC  . insulin glargine  20 Units Subcutaneous Daily  . levothyroxine  150 mcg Oral QAC breakfast  . sodium chloride flush  3 mL Intravenous Q12H   Continuous Infusions: . sodium chloride    . heparin 1,850 Units/hr (02/21/17 0705)  . [START ON 02/24/2017] milrinone     PRN Meds: sodium chloride, acetaminophen, hydrALAZINE, Melatonin, nitroGLYCERIN, ondansetron (ZOFRAN) IV, sodium chloride flush   Vital Signs    Vitals:   02/20/17 1629 02/20/17 1948 02/21/17 0023 02/21/17 0508  BP: 126/63 125/61 118/65 (!) 112/57  Pulse: 65 65 60 (!) 59  Resp:  18 18 18   Temp: 98 F (36.7 C) 98 F (36.7 C) 97.6 F (36.4 C) 97.7 F (36.5 C)  TempSrc: Oral Oral Oral Oral  SpO2: 98% 97% 98% 96%  Weight:    208 lb 6.4 oz (94.5 kg)  Height:        Intake/Output Summary (Last 24 hours) at 02/21/2017 0747 Last data filed at 02/20/2017 1800 Gross per 24 hour  Intake 840 ml  Output -  Net 840 ml   Filed Weights   02/19/17 0409 02/20/17 0325 02/21/17 0508  Weight: 211 lb 14.4 oz (96.1 kg) 209 lb 6.4 oz (95 kg) 208 lb 6.4 oz (94.5 kg)    Telemetry    Sinus - Personally Reviewed  ECG    No am EKG  Physical Exam   General: Well developed, well nourished, NAD  HEENT: OP clear, mucus membranes moist  SKIN: warm, dry. No rashes. Neuro: No focal deficits  Musculoskeletal: Muscle strength 5/5 all ext  Psychiatric: Mood and affect normal  Neck: No JVD, no  carotid bruits, no thyromegaly, no lymphadenopathy.  Lungs:Clear bilaterally, no wheezes, rhonci, crackles Cardiovascular: Regular rate and rhythm. No murmurs, gallops or rubs. Abdomen:Soft. Bowel sounds present. Non-tender.  Extremities: No lower extremity edema. Pulses are 2 + in the bilateral DP/PT.   Labs    Chemistry Recent Labs  Lab 02/19/17 2206 02/20/17 0310 02/21/17 0335  NA 136 136 138  K 3.6 3.6 3.7  CL 102 103 107  CO2 23 25 23   GLUCOSE 219* 219* 224*  BUN 9 8 9   CREATININE 0.88 0.80 0.84  CALCIUM 8.8* 8.8* 8.8*  PROT 6.3*  --   --   ALBUMIN 3.3*  --   --   AST 19  --   --   ALT 20  --   --   ALKPHOS 57  --   --   BILITOT 0.7  --   --   GFRNONAA >60 >60 >60  GFRAA >60 >60 >60  ANIONGAP 11 8 8      Hematology Recent Labs  Lab 02/19/17 0349 02/20/17 0310 02/21/17 0335  WBC 9.4 10.2 9.5  RBC 4.02 3.92 4.03  HGB 12.3 12.0 12.1  HCT 36.0 35.1* 35.7*  MCV 89.6 89.5 88.6  MCH 30.6 30.6 30.0  MCHC 34.2 34.2 33.9  RDW 12.5 12.5 12.5  PLT 336 347 341    Cardiac Enzymes Recent Labs  Lab 02/16/17 1902 02/17/17 0042 02/17/17 0451  TROPONINI 1.19* 1.14* 1.08*   No results for input(s): TROPIPOC in the last 168 hours.   BNPNo results for input(s): BNP, PROBNP in the last 168 hours.   DDimer No results for input(s): DDIMER in the last 168 hours.   Radiology    No results found.  Cardiac Studies   Cardiac cath 02/18/17:  Prox LAD lesion is 70% stenosed -at Diag 1. Dist LAD lesion is 99% stenosed.  Ost 1st Diag lesion is 85% stenosed. Very small caliber vessel  Ramus Distal lesion is 100% stenosed.  Prox RCA lesion is 75% stenosed. Mid RCA to Dist RCA lesion is 100% stenosed.--The R PDA and PL systems fill via left-to-right collaterals with brisk flow.  Prox Cx lesion is 50% stenosed -proximal to OM1  Ost 1st Mrg lesion is 100% stenosed. Ostial occlusion. The vessel fills retrograde via left to left collaterals  Dist Cx lesion is 75%  stenosed -in the band prior to small left posterolateral branch  There is mild left ventricular systolic dysfunction. The left ventricular ejection fraction is 50-55% by visual estimate.  LV end diastolic pressure is moderately elevated.  Diagnostic Diagram      Echo 02/17/17: - Left ventricle: The cavity size was normal. Systolic function was   normal. The estimated ejection fraction was in the range of 55%   to 60%. Probable hypokinesis of the basalinferior and   inferoseptal myocardium; consistent with ischemia in the   distribution of the right coronary artery. Features are   consistent with a pseudonormal left ventricular filling pattern,   with concomitant abnormal relaxation and increased filling   pressure (grade 2 diastolic dysfunction). - Mitral valve: There was mild regurgitation.  Patient Profile     71 y.o. female with history of DM, HTN admitted with chest pain and found to have a NSTEMI. 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 LV systolic function is normal.   Assessment & Plan    1. 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 LVEF normal by echo.  She has been seen by CT surgery and planning underway for CABG next week.  - She has no chest pain. BP is controlled.  -Continue ASA, beta blocker, statin and heparin drip -CABG next week.   2. DM: Basal insulin increased yesterday and blood sugars are still high. She admits to dietary non-compliance. A1C 8.8. Will ask DM coordinator to see today.   3. Hypokalemia: Will replace potassium today.   For questions or updates, please contact [place] Please consult [email or web address] for contact info under Cardiology/STEMI.      Signed, Lauree Chandler, MD  02/21/2017, 7:47 AM

## 2017-02-21 NOTE — Progress Notes (Signed)
ANTICOAGULATION CONSULT NOTE - Follow Up Consult  Pharmacy Consult for heparin Indication: chest pain/ACS  Allergies  Allergen Reactions  . Shellfish Allergy Other (See Comments)    UNKNOWN REACTION.  Allergy found on ALLERGY TEST.  . Aggrenox [Aspirin-Dipyridamole Er] Other (See Comments)    Redness in eyes, takes aspirin 325 at home at baseline    Patient Measurements: Height: 5' (152.4 cm) Weight: 208 lb 6.4 oz (94.5 kg) IBW/kg (Calculated) : 45.5 Heparin Dosing Weight: 68.9kg  Vital Signs: Temp: 97.9 F (36.6 C) (11/23 1144) Temp Source: Oral (11/23 1144) BP: 128/73 (11/23 1144) Pulse Rate: 60 (11/23 1144)  Labs: Recent Labs    02/19/17 0349 02/19/17 1148 02/19/17 2206 02/20/17 0310 02/21/17 0335  HGB 12.3  --   --  12.0 12.1  HCT 36.0  --   --  35.1* 35.7*  PLT 336  --   --  347 341  APTT  --   --  61*  --   --   LABPROT  --   --  13.1  --   --   INR  --   --  1.00  --   --   HEPARINUNFRC 0.45 0.50  --  0.36 0.50  CREATININE  --   --  0.88 0.80 0.84    Estimated Creatinine Clearance: 65.9 mL/min (by C-G formula based on SCr of 0.84 mg/dL).   Medical History: Past Medical History:  Diagnosis Date  . Diabetes mellitus without complication ([place])   . History of kidney stones    history of stones  . Hypertension   . Hypothyroidism   . NSTEMI (non-ST elevated myocardial infarction) ([place])   . TIA (transient ischemic attack)     Medications:  Infusions:  . sodium chloride    . heparin 1,850 Units/hr (02/21/17 0705)  . [START ON 02/24/2017] milrinone      Assessment: 89 yof presented to OSH with CP. Pharmacy consulted to dose heparin. Patient is now s/p cath with multivessel CAD and plans for CABG Monday. Heparin drip restarted after cath, drip rate 1850 uts/hr HL 0.50 at goal.  CBC stable.    Goal of Therapy:  Heparin level 0.3-0.7 units/ml Monitor platelets by anticoagulation protocol: Yes   Plan:  Continue heparin at 1850 units/hr  Daily HL  and CBC  .Leroy Libman, PharmD Pharmacy Resident Pager: [phone]

## 2017-02-22 LAB — GLUCOSE, CAPILLARY
GLUCOSE-CAPILLARY: 226 mg/dL — AB (ref 65–99)
GLUCOSE-CAPILLARY: 257 mg/dL — AB (ref 65–99)
Glucose-Capillary: 251 mg/dL — ABNORMAL HIGH (ref 65–99)
Glucose-Capillary: 199 mg/dL — ABNORMAL HIGH (ref 65–99)

## 2017-02-22 LAB — BASIC METABOLIC PANEL
Anion gap: 10 (ref 5–15)
BUN: 10 mg/dL (ref 6–20)
CO2: 22 mmol/L (ref 22–32)
CREATININE: 0.91 mg/dL (ref 0.44–1.00)
Calcium: 8.9 mg/dL (ref 8.9–10.3)
Chloride: 104 mmol/L (ref 101–111)
GFR calc Af Amer: >60 mL/min (ref >60)
GLUCOSE: 252 mg/dL — AB (ref 65–99)
POTASSIUM: 4.1 mmol/L (ref 3.5–5.1)
SODIUM: 136 mmol/L (ref 135–145)

## 2017-02-22 LAB — CBC
HCT: 36.8 % (ref 36.0–46.0)
Hemoglobin: 12.6 g/dL (ref 12.0–15.0)
MCH: 30.5 pg (ref 26.0–34.0)
MCHC: 34.2 g/dL (ref 30.0–36.0)
MCV: 89.1 fL (ref 78.0–100.0)
PLATELETS: 359 10*3/uL (ref 150–400)
RBC: 4.13 MIL/uL (ref 3.87–5.11)
RDW: 12.7 % (ref 11.5–15.5)
WBC: 9.2 10*3/uL (ref 4.0–10.5)

## 2017-02-22 LAB — HEPARIN LEVEL (UNFRACTIONATED): HEPARIN UNFRACTIONATED: 0.48 [IU]/mL (ref 0.30–0.70)

## 2017-02-22 NOTE — Progress Notes (Signed)
Progress Note  Patient Name: Theresa Ray Date of Encounter: 02/22/2017  Primary Cardiologist: Angelena Form  Subjective   No dyspnea or chest pain.   Inpatient Medications    Scheduled Meds: . amLODipine  10 mg Oral Daily  . aspirin EC  81 mg Oral Daily  . atorvastatin  80 mg Oral q1800  . carvedilol  25 mg Oral BID WC  . cloNIDine  0.2 mg Oral QHS  . gabapentin  100 mg Oral TID  . insulin aspart  0-5 Units Subcutaneous QHS  . insulin aspart  0-9 Units Subcutaneous TID WC  . insulin aspart  4 Units Subcutaneous TID WC  . insulin glargine  20 Units Subcutaneous Daily  . levothyroxine  150 mcg Oral QAC breakfast  . sodium chloride flush  3 mL Intravenous Q12H   Continuous Infusions: . sodium chloride    . heparin 1,850 Units/hr (02/22/17 8502)  . [START ON 02/24/2017] milrinone     PRN Meds: sodium chloride, acetaminophen, hydrALAZINE, Melatonin, nitroGLYCERIN, ondansetron (ZOFRAN) IV, polyethylene glycol, sodium chloride flush   Vital Signs    Vitals:   02/22/17 0043 02/22/17 0400 02/22/17 0700 02/22/17 0936  BP: 131/71 138/64 123/78 123/63  Pulse: 73 63 64   Resp: 18 18 20    Temp: 98.4 F (36.9 C) (!) 97.5 F (36.4 C) 97.7 F (36.5 C)   TempSrc: Oral Oral Oral   SpO2: 96% 96% 100%   Weight:      Height:        Intake/Output Summary (Last 24 hours) at 02/22/2017 0950 Last data filed at 02/22/2017 7741 Gross per 24 hour  Intake 1509.82 ml  Output -  Net 1509.82 ml   Filed Weights   02/19/17 0409 02/20/17 0325 02/21/17 0508  Weight: 211 lb 14.4 oz (96.1 kg) 209 lb 6.4 oz (95 kg) 208 lb 6.4 oz (94.5 kg)    Telemetry    Sinus - Personally Reviewed  ECG    No am EKG  Physical Exam   General: Well developed, well nourished, NAD  HEENT: OP clear, mucus membranes moist  SKIN: warm, dry. No rashes. Neuro: No focal deficits  Musculoskeletal: Muscle strength 5/5 all ext  Psychiatric: Mood and affect normal  Neck: No JVD, no carotid bruits, no  thyromegaly, no lymphadenopathy.  Lungs:Clear bilaterally, no wheezes, rhonci, crackles Cardiovascular: Regular rate and rhythm. No murmurs, gallops or rubs. Abdomen:Soft. Bowel sounds present. Non-tender.  Extremities: No lower extremity edema. Pulses are 2 + in the bilateral DP/PT.   Labs    Chemistry Recent Labs  Lab 02/19/17 2206 02/20/17 0310 02/21/17 0335 02/22/17 0427  NA 136 136 138 136  K 3.6 3.6 3.7 4.1  CL 102 103 107 104  CO2 23 25 23 22   GLUCOSE 219* 219* 224* 252*  BUN 9 8 9 10   CREATININE 0.88 0.80 0.84 0.91  CALCIUM 8.8* 8.8* 8.8* 8.9  PROT 6.3*  --   --   --   ALBUMIN 3.3*  --   --   --   AST 19  --   --   --   ALT 20  --   --   --   ALKPHOS 57  --   --   --   BILITOT 0.7  --   --   --   GFRNONAA >60 >60 >60 >60  GFRAA >60 >60 >60 >60  ANIONGAP 11 8 8 10      Hematology Recent Labs  Lab 02/20/17 0310 02/21/17  0335 02/22/17 0427  WBC 10.2 9.5 9.2  RBC 3.92 4.03 4.13  HGB 12.0 12.1 12.6  HCT 35.1* 35.7* 36.8  MCV 89.5 88.6 89.1  MCH 30.6 30.0 30.5  MCHC 34.2 33.9 34.2  RDW 12.5 12.5 12.7  PLT 347 341 359    Cardiac Enzymes Recent Labs  Lab 02/16/17 1902 02/17/17 0042 02/17/17 0451  TROPONINI 1.19* 1.14* 1.08*   No results for input(s): TROPIPOC in the last 168 hours.   BNPNo results for input(s): BNP, PROBNP in the last 168 hours.   DDimer No results for input(s): DDIMER in the last 168 hours.   Radiology    No results found.  Cardiac Studies   Cardiac cath 02/18/17:  Prox LAD lesion is 70% stenosed -at Diag 1. Dist LAD lesion is 99% stenosed.  Ost 1st Diag lesion is 85% stenosed. Very small caliber vessel  Ramus Distal lesion is 100% stenosed.  Prox RCA lesion is 75% stenosed. Mid RCA to Dist RCA lesion is 100% stenosed.--The R PDA and PL systems fill via left-to-right collaterals with brisk flow.  Prox Cx lesion is 50% stenosed -proximal to OM1  Ost 1st Mrg lesion is 100% stenosed. Ostial occlusion. The vessel fills  retrograde via left to left collaterals  Dist Cx lesion is 75% stenosed -in the band prior to small left posterolateral branch  There is mild left ventricular systolic dysfunction. The left ventricular ejection fraction is 50-55% by visual estimate.  LV end diastolic pressure is moderately elevated.  Diagnostic Diagram      Echo 02/17/17: - Left ventricle: The cavity size was normal. Systolic function was   normal. The estimated ejection fraction was in the range of 55%   to 60%. Probable hypokinesis of the basalinferior and   inferoseptal myocardium; consistent with ischemia in the   distribution of the right coronary artery. Features are   consistent with a pseudonormal left ventricular filling pattern,   with concomitant abnormal relaxation and increased filling   pressure (grade 2 diastolic dysfunction). - Mitral valve: There was mild regurgitation.  Patient Profile     71 y.o. female with history of DM, HTN admitted with chest pain and found to have a NSTEMI. 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 LV systolic function is normal.   Assessment & Plan    1. 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 LVEF normal by echo.  She has been seen by CT surgery and planning underway for CABG next week.  Monday with Dr Servando Snare continue heparin asa and beta blocker   2. DM: Basal insulin increased yesterday and blood sugars are still high. She admits to dietary non-compliance. A1C 8.8. Recs per DM coordinator   3. Hypokalemia: replaced  Lab Results  Component Value Date   CREATININE 0.91 02/22/2017   BUN 10 02/22/2017   NA 136 02/22/2017   K 4.1 02/22/2017   CL 104 02/22/2017   CO2 22 02/22/2017     Jenkins Rouge

## 2017-02-22 NOTE — Progress Notes (Signed)
CARDIAC REHAB PHASE I   PRE:  Rate/Rhythm: Sinus 63  BP:    Sitting: 131/78     SaO2: 99% Room Air  MODE:  Ambulation: 470 ft   POST:  Rate/Rhythem: 72  BP:    Sitting: 121/59     SaO2: 97% Room Air [phone] Patient ambulated in the hallway with assistance times one. Patient stopped times one to rest. Patient tolerated ambulation without complaints. Patient assisted back to bed with call bell within reach.   Harrell Gave RN BSN

## 2017-02-22 NOTE — Progress Notes (Signed)
ANTICOAGULATION CONSULT NOTE - Follow Up Consult  Pharmacy Consult for heparin Indication: chest pain/ACS  Allergies  Allergen Reactions  . Shellfish Allergy Other (See Comments)    UNKNOWN REACTION.  Allergy found on ALLERGY TEST.  . Aggrenox [Aspirin-Dipyridamole Er] Other (See Comments)    Redness in eyes, takes aspirin 325 at home at baseline    Patient Measurements: Height: 5' (152.4 cm) Weight: 208 lb 6.4 oz (94.5 kg) IBW/kg (Calculated) : 45.5 Heparin Dosing Weight: 68.9kg  Vital Signs: Temp: 97.7 F (36.5 C) (11/24 0700) Temp Source: Oral (11/24 0700) BP: 123/78 (11/24 0700) Pulse Rate: 64 (11/24 0700)  Labs: Recent Labs    02/19/17 2206  02/20/17 0310 02/21/17 0335 02/22/17 0427  HGB  --    < > 12.0 12.1 12.6  HCT  --   --  35.1* 35.7* 36.8  PLT  --   --  347 341 359  APTT 61*  --   --   --   --   LABPROT 13.1  --   --   --   --   INR 1.00  --   --   --   --   HEPARINUNFRC  --   --  0.36 0.50 0.48  CREATININE 0.88  --  0.80 0.84 0.91   < > = values in this interval not displayed.    Estimated Creatinine Clearance: 60.8 mL/min (by C-G formula based on SCr of 0.91 mg/dL).   Medical History: Past Medical History:  Diagnosis Date  . Diabetes mellitus without complication ([place])   . History of kidney stones    history of stones  . Hypertension   . Hypothyroidism   . NSTEMI (non-ST elevated myocardial infarction) ([place])   . TIA (transient ischemic attack)     Medications:  Infusions:  . sodium chloride    . heparin 1,850 Units/hr (02/22/17 8675)  . [START ON 02/24/2017] milrinone      Assessment: 3 yof presented to OSH with CP. Pharmacy consulted to dose heparin. Patient is now s/p cath with multivessel CAD and plans for CABG Monday. Heparin drip restarted after cath, drip rate 1850 uts/hr HL 0.48 at goal.  CBC stable.  No bleeding reported.  Goal of Therapy:  Heparin level 0.3-0.7 units/ml Monitor platelets by anticoagulation protocol: Yes    Plan:  Continue heparin at 1850 units/hr  Daily HL and CBC  Leroy Libman, PharmD Pharmacy Resident Pager: [phone]

## 2017-02-23 LAB — CBC
HCT: 35.5 % — ABNORMAL LOW (ref 36.0–46.0)
Hemoglobin: 12.1 g/dL (ref 12.0–15.0)
MCH: 30.2 pg (ref 26.0–34.0)
MCHC: 34.1 g/dL (ref 30.0–36.0)
MCV: 88.5 fL (ref 78.0–100.0)
PLATELETS: 358 10*3/uL (ref 150–400)
RBC: 4.01 MIL/uL (ref 3.87–5.11)
RDW: 12.7 % (ref 11.5–15.5)
WBC: 9.6 10*3/uL (ref 4.0–10.5)

## 2017-02-23 LAB — GLUCOSE, CAPILLARY
GLUCOSE-CAPILLARY: 216 mg/dL — AB (ref 65–99)
GLUCOSE-CAPILLARY: 213 mg/dL — AB (ref 65–99)
GLUCOSE-CAPILLARY: 192 mg/dL — AB (ref 65–99)
GLUCOSE-CAPILLARY: 233 mg/dL — AB (ref 65–99)

## 2017-02-23 LAB — TYPE AND SCREEN
ABO/RH(D): B NEG
ANTIBODY SCREEN: NEGATIVE

## 2017-02-23 LAB — HEPARIN LEVEL (UNFRACTIONATED): Heparin Unfractionated: 0.5 IU/mL (ref 0.30–0.70)

## 2017-02-23 MED ORDER — PLASMA-LYTE 148 IV SOLN
INTRAVENOUS | Status: AC
  Administered 2017-02-24: 500 mL
  Filled 2017-02-23: qty 2.5

## 2017-02-23 MED ORDER — SODIUM CHLORIDE 0.9 % IV SOLN
INTRAVENOUS | Status: DC
  Filled 2017-02-23: qty 30

## 2017-02-23 MED ORDER — METOPROLOL TARTRATE 12.5 MG HALF TABLET
12.5000 mg | ORAL_TABLET | Freq: Once | ORAL | Status: AC
  Administered 2017-02-24: 12.5 mg via ORAL
  Filled 2017-02-23: qty 1

## 2017-02-23 MED ORDER — DEXTROSE 5 % IV SOLN
750.0000 mg | INTRAVENOUS | Status: DC
  Filled 2017-02-23: qty 750

## 2017-02-23 MED ORDER — VANCOMYCIN HCL 10 G IV SOLR
1500.0000 mg | INTRAVENOUS | Status: AC
  Administered 2017-02-24: 1500 mg via INTRAVENOUS
  Filled 2017-02-23: qty 1500

## 2017-02-23 MED ORDER — DEXTROSE 5 % IV SOLN
0.0000 ug/min | INTRAVENOUS | Status: DC
  Filled 2017-02-23: qty 4

## 2017-02-23 MED ORDER — TRANEXAMIC ACID (OHS) BOLUS VIA INFUSION
15.0000 mg/kg | INTRAVENOUS | Status: AC
  Administered 2017-02-24: 1414.5 mg via INTRAVENOUS
  Filled 2017-02-23: qty 1415

## 2017-02-23 MED ORDER — CHLORHEXIDINE GLUCONATE CLOTH 2 % EX PADS
6.0000 | MEDICATED_PAD | Freq: Once | CUTANEOUS | Status: AC
  Administered 2017-02-23: 6 via TOPICAL

## 2017-02-23 MED ORDER — CHLORHEXIDINE GLUCONATE 0.12 % MT SOLN: 15.0000 mL | Freq: Once | OROMUCOSAL | Status: DC

## 2017-02-23 MED ORDER — POTASSIUM CHLORIDE 2 MEQ/ML IV SOLN
80.0000 meq | INTRAVENOUS | Status: DC
  Filled 2017-02-23: qty 40

## 2017-02-23 MED ORDER — DEXTROSE 5 % IV SOLN
1.5000 g | INTRAVENOUS | Status: AC
  Administered 2017-02-24: .75 g via INTRAVENOUS
  Administered 2017-02-24: 1.5 g via INTRAVENOUS
  Filled 2017-02-23: qty 1.5

## 2017-02-23 MED ORDER — MAGNESIUM SULFATE 50 % IJ SOLN
40.0000 meq | INTRAMUSCULAR | Status: DC
  Filled 2017-02-23: qty 9.85

## 2017-02-23 MED ORDER — NITROGLYCERIN IN D5W 200-5 MCG/ML-% IV SOLN
2.0000 ug/min | INTRAVENOUS | Status: DC
  Filled 2017-02-23: qty 250

## 2017-02-23 MED ORDER — CHLORHEXIDINE GLUCONATE CLOTH 2 % EX PADS
6.0000 | MEDICATED_PAD | Freq: Once | CUTANEOUS | Status: AC
  Administered 2017-02-24: 6 via TOPICAL

## 2017-02-23 MED ORDER — SODIUM CHLORIDE 0.9 % IV SOLN
30.0000 ug/min | INTRAVENOUS | Status: DC
  Filled 2017-02-23: qty 2

## 2017-02-23 MED ORDER — TRANEXAMIC ACID 1000 MG/10ML IV SOLN
1.5000 mg/kg/h | INTRAVENOUS | Status: AC
  Administered 2017-02-24: 1.5 mg/kg/h via INTRAVENOUS
  Filled 2017-02-23: qty 25

## 2017-02-23 MED ORDER — SODIUM CHLORIDE 0.9 % IV SOLN
INTRAVENOUS | Status: AC
  Administered 2017-02-24: 1 [IU]/h via INTRAVENOUS
  Filled 2017-02-23: qty 1

## 2017-02-23 MED ORDER — DEXMEDETOMIDINE HCL IN NACL 400 MCG/100ML IV SOLN
0.1000 ug/kg/h | INTRAVENOUS | Status: AC
  Administered 2017-02-24: .3 ug/kg/h via INTRAVENOUS
  Filled 2017-02-23: qty 100

## 2017-02-23 MED ORDER — TRANEXAMIC ACID (OHS) PUMP PRIME SOLUTION
2.0000 mg/kg | INTRAVENOUS | Status: DC
  Filled 2017-02-23: qty 1.89

## 2017-02-23 MED ORDER — DOPAMINE-DEXTROSE 3.2-5 MG/ML-% IV SOLN
0.0000 ug/kg/min | INTRAVENOUS | Status: AC
  Administered 2017-02-24: 2.5 ug/kg/min via INTRAVENOUS
  Filled 2017-02-23: qty 250

## 2017-02-23 NOTE — Progress Notes (Signed)
Progress Note  Patient Name: Theresa Ray Date of Encounter: 02/23/2017  Primary Cardiologist: Dr. Angelena Form  Subjective   Denies any CP, mainly DOE while walking down the hallway. Nervous about tomorrow's surgery  Inpatient Medications    Scheduled Meds: . amLODipine  10 mg Oral Daily  . aspirin EC  81 mg Oral Daily  . atorvastatin  80 mg Oral q1800  . carvedilol  25 mg Oral BID WC  . cloNIDine  0.2 mg Oral QHS  . gabapentin  100 mg Oral TID  . insulin aspart  0-5 Units Subcutaneous QHS  . insulin aspart  0-9 Units Subcutaneous TID WC  . insulin aspart  4 Units Subcutaneous TID WC  . insulin glargine  20 Units Subcutaneous Daily  . levothyroxine  150 mcg Oral QAC breakfast  . sodium chloride flush  3 mL Intravenous Q12H   Continuous Infusions: . sodium chloride    . heparin 1,850 Units/hr (02/22/17 2204)  . [START ON 02/24/2017] milrinone     PRN Meds: sodium chloride, acetaminophen, hydrALAZINE, Melatonin, nitroGLYCERIN, ondansetron (ZOFRAN) IV, polyethylene glycol, sodium chloride flush   Vital Signs    Vitals:   02/22/17 1100 02/22/17 1614 02/22/17 2055 02/23/17 0400  BP: 128/66 (!) 142/71 (!) 126/58 119/63  Pulse: 74 70 71 (!) 50  Resp: 18 18 18 16   Temp: 98.1 F (36.7 C) 98 F (36.7 C) 98.8 F (37.1 C) 97.9 F (36.6 C)  TempSrc: Oral Oral Oral Oral  SpO2: 98% 99% 95% 98%  Weight:    207 lb 14.4 oz (94.3 kg)  Height:        Intake/Output Summary (Last 24 hours) at 02/23/2017 0855 Last data filed at 02/23/2017 0600 Gross per 24 hour  Intake 1163.5 ml  Output -  Net 1163.5 ml   Filed Weights   02/20/17 0325 02/21/17 0508 02/23/17 0400  Weight: 209 lb 6.4 oz (95 kg) 208 lb 6.4 oz (94.5 kg) 207 lb 14.4 oz (94.3 kg)    Telemetry    NSR with occasional PVCs, HR mainly 60-70s, occasional dip into the low 50s and high 40s - Personally Reviewed  ECG    Sinus rhythm with TWI in inferior leads - Personally Reviewed  Physical Exam   GEN: No  acute distress.   Neck: No JVD Cardiac: RRR, no murmurs, rubs, or gallops.  Respiratory: Clear to auscultation bilaterally. GI: Soft, nontender, non-distended  MS: No edema; No deformity. Neuro:  Nonfocal  Psych: Normal affect   Labs    Chemistry Recent Labs  Lab 02/19/17 2206 02/20/17 0310 02/21/17 0335 02/22/17 0427  NA 136 136 138 136  K 3.6 3.6 3.7 4.1  CL 102 103 107 104  CO2 23 25 23 22   GLUCOSE 219* 219* 224* 252*  BUN 9 8 9 10   CREATININE 0.88 0.80 0.84 0.91  CALCIUM 8.8* 8.8* 8.8* 8.9  PROT 6.3*  --   --   --   ALBUMIN 3.3*  --   --   --   AST 19  --   --   --   ALT 20  --   --   --   ALKPHOS 57  --   --   --   BILITOT 0.7  --   --   --   GFRNONAA >60 >60 >60 >60  GFRAA >60 >60 >60 >60  ANIONGAP 11 8 8 10      Hematology Recent Labs  Lab 02/21/17 0335 02/22/17 0427 02/23/17 6599  WBC 9.5 9.2 9.6  RBC 4.03 4.13 4.01  HGB 12.1 12.6 12.1  HCT 35.7* 36.8 35.5*  MCV 88.6 89.1 88.5  MCH 30.0 30.5 30.2  MCHC 33.9 34.2 34.1  RDW 12.5 12.7 12.7  PLT 341 359 358    Cardiac Enzymes Recent Labs  Lab 02/16/17 1902 02/17/17 0042 02/17/17 0451  TROPONINI 1.19* 1.14* 1.08*   No results for input(s): TROPIPOC in the last 168 hours.   BNPNo results for input(s): BNP, PROBNP in the last 168 hours.   DDimer No results for input(s): DDIMER in the last 168 hours.   Radiology    No results found.  Cardiac Studies   Echo 02/17/2017 LV EF: 55% -   60%  Study Conclusions  - Left ventricle: The cavity size was normal. Systolic function was   normal. The estimated ejection fraction was in the range of 55%   to 60%. Probable hypokinesis of the basalinferior and   inferoseptal myocardium; consistent with ischemia in the   distribution of the right coronary artery. Features are   consistent with a pseudonormal left ventricular filling pattern,   with concomitant abnormal relaxation and increased filling   pressure (grade 2 diastolic dysfunction). -  Mitral valve: There was mild regurgitation.    Cath 02/18/2017 Conclusion     Prox LAD lesion is 70% stenosed -at Diag 1. Dist LAD lesion is 99% stenosed.  Ost 1st Diag lesion is 85% stenosed. Very small caliber vessel  Ramus Distal lesion is 100% stenosed.  Prox RCA lesion is 75% stenosed. Mid RCA to Dist RCA lesion is 100% stenosed.--The R PDA and PL systems fill via left-to-right collaterals with brisk flow.  Prox Cx lesion is 50% stenosed -proximal to OM1  Ost 1st Mrg lesion is 100% stenosed. Ostial occlusion. The vessel fills retrograde via left to left collaterals  Dist Cx lesion is 75% stenosed -in the band prior to small left posterolateral branch  There is mild left ventricular systolic dysfunction. The left ventricular ejection fraction is 50-55% by visual estimate.  LV end diastolic pressure is moderately elevated.   Patient has multivessel severe disease with total occlusion of the RCA is likely culprit.  It appears that the OM1 occlusion may very well be chronic.  There is also distal occlusion of 1 of the branches of the ramus intermedius and the LAD at the apex.  With the proximal circumflex and LAD disease as well as the extensive diffuse disease distally, I reviewed the images with Dr. Angelena Form.  We both felt like the best option was to place but had patient back on IV heparin and discussed with CT surgery to determine if CABG would be a reasonable option to allow a more complete revascularization of the RCA and OM territories.  If CABG is not considered to be a reasonable option, then attempted PCI of the occluded RCA with potentially reasonable along with the LAD.  Plan: Return to nursing unit, restart IV heparin 6-8 hours post sheath removal. CT surgery consultation Continue aggressive risk factor modification, blood pressure control, lipid control and glycemic control.  Further plans based on CT surgery consultation.     Patient Profile     71 y.o.  female with history of DM, HTN admitted with chest pain and found to have a NSTEMI. 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 LV systolic function is normal.    Assessment & Plan    1. NSTEMI: cath 02/18/2017 showed 3v disease, seen by Dr. Servando Snare,  plan for CABG tomorrow.   2. HTN: BP well controlled. Coreg 25mg  BID, clonidine 0.2 mg daily, amlodipine 10mg  daily.  3. Hypertriglyceridemia: LDL well controlled, continue lipitor 80mg  daily. Triglyceride > 300, cannot add Lovaza due to shellfish allergy. Consider add zetia or vascepa. Unclear if vascepa is also contraindicated  4. DM II: hgb A1C 8.8. On insulin  5. OSA  6. Hypothyroidism: synthroid  7. H/o TIA: carotid US 1-39% disease   For questions or updates, please contact [place] Please consult [email or web address] for contact info under Cardiology/STEMI.      Hilbert Corrigan, PA  02/23/2017, 8:55 AM    Patient examined chart reviewed Pre CABG w/u done carotids ok No chest pain exam otherwise normal for CABG in am stable  Jenkins Rouge

## 2017-02-23 NOTE — Progress Notes (Signed)
ANTICOAGULATION CONSULT NOTE - Follow Up Consult  Pharmacy Consult for heparin Indication: chest pain/ACS  Allergies  Allergen Reactions  . Shellfish Allergy Other (See Comments)    UNKNOWN REACTION.  Allergy found on ALLERGY TEST.  . Aggrenox [Aspirin-Dipyridamole Er] Other (See Comments)    Redness in eyes, takes aspirin 325 at home at baseline    Patient Measurements: Height: 5' (152.4 cm) Weight: 207 lb 14.4 oz (94.3 kg) IBW/kg (Calculated) : 45.5 Heparin Dosing Weight: 68.9kg  Vital Signs: Temp: 97.9 F (36.6 C) (11/25 0400) Temp Source: Oral (11/25 0400) BP: 119/63 (11/25 0400) Pulse Rate: 50 (11/25 0400)  Labs: Recent Labs    02/21/17 0335 02/22/17 0427 02/23/17 0313  HGB 12.1 12.6 12.1  HCT 35.7* 36.8 35.5*  PLT 341 359 358  HEPARINUNFRC 0.50 0.48 0.50  CREATININE 0.84 0.91  --     Estimated Creatinine Clearance: 60.7 mL/min (by C-G formula based on SCr of 0.91 mg/dL).   Medical History: Past Medical History:  Diagnosis Date  . Diabetes mellitus without complication ([place])   . History of kidney stones    history of stones  . Hypertension   . Hypothyroidism   . NSTEMI (non-ST elevated myocardial infarction) ([place])   . TIA (transient ischemic attack)     Medications:  Infusions:  . sodium chloride    . heparin 1,850 Units/hr (02/22/17 2204)  . [START ON 02/24/2017] milrinone      Assessment: 46 yof presented to OSH with CP. Pharmacy consulted to dose heparin. Patient is now s/p cath with multivessel CAD and plans for CABG Monday. Heparin drip restarted after cath, drip rate 1850 units/hr HL 0.50 at goal.  CBC stable. No bleeding reported.  Goal of Therapy:  Heparin level 0.3-0.7 units/ml Monitor platelets by anticoagulation protocol: Yes   Plan:  Continue heparin at 1850 units/hr  Daily HL and CBC  Leroy Libman, PharmD Pharmacy Resident Pager: [phone]

## 2017-02-23 NOTE — Progress Notes (Signed)
      [address]       [place] [id number]             [phone]     CARDIOTHORACIC SURGERY PROGRESS NOTE  5 Days Post-Op  S/P Procedure(s) (LRB): LEFT HEART CATH AND CORONARY ANGIOGRAPHY (N/A)  Subjective: Dyspnea with activity.   No chest pain  Objective: Vital signs in last 24 hours: Temp:  [97.9 F (36.6 C)-98.8 F (37.1 C)] 97.9 F (36.6 C) (11/25 0400) Pulse Rate:  [50-74] 60 (11/25 0919) Cardiac Rhythm: Normal sinus rhythm (11/25 0745) Resp:  [16-18] 16 (11/25 0400) BP: (119-142)/(58-71) 122/59 (11/25 0919) SpO2:  [95 %-99 %] 98 % (11/25 0400) Weight:  [207 lb 14.4 oz (94.3 kg)] 207 lb 14.4 oz (94.3 kg) (11/25 0400)  Physical Exam:  Rhythm:   sinus  Breath sounds: clear  Heart sounds:  RRR  Incisions:  n/a  Abdomen:  soft  Extremities:  Warm   Intake/Output from previous day: 11/24 0701 - 11/25 0700 In: 1163.5 [P.O.:960; I.V.:203.5] Out: -  Intake/Output this shift: No intake/output data recorded.  Lab Results: Recent Labs    02/22/17 0427 02/23/17 0313  WBC 9.2 9.6  HGB 12.6 12.1  HCT 36.8 35.5*  PLT 359 358   BMET:  Recent Labs    02/21/17 0335 02/22/17 0427  NA 138 136  K 3.7 4.1  CL 107 104  CO2 23 22  GLUCOSE 224* 252*  BUN 9 10  CREATININE 0.84 0.91  CALCIUM 8.8* 8.9    CBG (last 3)  Recent Labs    02/22/17 1622 02/22/17 2102 02/23/17 0734  GLUCAP 199* 257* 216*   PT/INR:  No results for input(s): LABPROT, INR in the last 72 hours.  CXR:  CHEST  2 VIEW  COMPARISON:  02/15/2017 chest radiograph  FINDINGS: Stable borderline cardiomegaly. No focal consolidation. No pleural effusion or pneumothorax. No acute osseous abnormality is evident. Mild discogenic degenerative changes of thoracic spine.  IMPRESSION: No acute pulmonary process identified. Stable borderline cardiomegaly.   Electronically Signed   By: Kristine Garbe M.D.   On: 02/16/2017 23:11  Assessment/Plan: S/P  Procedure(s) (LRB): LEFT HEART CATH AND CORONARY ANGIOGRAPHY (N/A)  Clinically stable For OR tomorrow  Rexene Alberts, MD 02/23/2017 10:41 AM

## 2017-02-23 NOTE — Procedures (Signed)
Procedure Note  Theresa Ray  [id number]  [id number]  02/23/2017   Procedure performed: lap band adjustment  Preop diagnosis: coronary artery disease, going for CABG tomorrow. Dr. Ola Spurr requested lap band be deflated to ease introduction of an esophageal echo probe  Post-op diagnosis/intraop findings: 4cc removed from band   Description of procedure: verbal informed consent was obtained. The port was palpated in the right upper quadrant in the skin overlying it was prepped with ChloraPrep. A 20-gauge huber needle was used to access the port and I was able to aspirate 4 mL of clear fluid. The needle was removed and the skin wiped with alcohol. A dry dressing was applied. She tolerated the procedure well.  The patient will need to follow up with Dr. Lucia Gaskins in our office if and when she desires further adjustment of the lap band.

## 2017-02-23 NOTE — Anesthesia Preprocedure Evaluation (Addendum)
Anesthesia Evaluation  Patient identified by MRN, date of birth, ID band Patient awake    Reviewed: Allergy & Precautions, NPO status , Patient's Chart, lab work & pertinent test results  History of Anesthesia Complications Negative for: history of anesthetic complications  Airway Mallampati: II  TM Distance: <3 FB Neck ROM: Full  Mouth opening: Limited Mouth Opening  Dental  (+) Dental Advisory Given   Pulmonary neg pulmonary ROS, former smoker (quit 1975),    breath sounds clear to auscultation       Cardiovascular hypertension, + angina + CAD (multivessel disease) and + Past MI   Rhythm:Regular Rate:Normal  02/17/17 ECHO: EF 55-60, inferior hypokinesis, mod MR   Neuro/Psych TIA   GI/Hepatic Neg liver ROS, neg GERD  ,S/p lap band   Endo/Other  diabetesHypothyroidism Morbid obesity (s/p gastric banding)  Renal/GU negative Renal ROS     Musculoskeletal   Abdominal (+) + obese,   Peds  Hematology   Anesthesia Other Findings   Reproductive/Obstetrics                            Anesthesia Physical Anesthesia Plan  ASA: III  Anesthesia Plan: General   Post-op Pain Management:    Induction: Intravenous  PONV Risk Score and Plan:   Airway Management Planned: Oral ETT and Video Laryngoscope Planned  Additional Equipment: Arterial line, CVP, PA Cath, TEE and Ultrasound Guidance Line Placement  Intra-op Plan:   Post-operative Plan: Post-operative intubation/ventilation  Informed Consent: I have reviewed the patients History and Physical, chart, labs and discussed the procedure including the risks, benefits and alternatives for the proposed anesthesia with the patient or authorized representative who has indicated his/her understanding and acceptance.   Dental advisory given  Plan Discussed with: CRNA and Surgeon  Anesthesia Plan Comments: (Plan routine monitors, A line, PA cath,  GETA with TEE and post op intubation Discussed with Drs Servando Snare and Lucia Gaskins. Lap band deflated for best chance at TEE insertion, but will not insert past any resistance.)        Anesthesia Quick Evaluation

## 2017-02-24 ENCOUNTER — Inpatient Hospital Stay (HOSPITAL_COMMUNITY): Payer: Medicare HMO | Admitting: Anesthesiology

## 2017-02-24 ENCOUNTER — Inpatient Hospital Stay (HOSPITAL_COMMUNITY): Payer: Medicare HMO

## 2017-02-24 ENCOUNTER — Encounter (HOSPITAL_COMMUNITY)
Admission: AD | Disposition: A | Discharge status: Home Health | Payer: Self-pay | Source: Other Acute Inpatient Hospital | Attending: Internal Medicine

## 2017-02-24 ENCOUNTER — Encounter (HOSPITAL_COMMUNITY): Payer: Self-pay | Admitting: Certified Registered Nurse Anesthetist

## 2017-02-24 DIAGNOSIS — I2510 Atherosclerotic heart disease of native coronary artery without angina pectoris: Secondary | ICD-10-CM

## 2017-02-24 DIAGNOSIS — Z951 Presence of aortocoronary bypass graft: Secondary | ICD-10-CM

## 2017-02-24 HISTORY — PX: ENDOVEIN HARVEST OF GREATER SAPHENOUS VEIN: SHX5059

## 2017-02-24 HISTORY — PX: CORONARY ARTERY BYPASS GRAFT: SHX141

## 2017-02-24 HISTORY — PX: TEE WITHOUT CARDIOVERSION: SHX5443

## 2017-02-24 LAB — POCT I-STAT, CHEM 8
BUN: 14 mg/dL (ref 6–20)
BUN: 11 mg/dL (ref 6–20)
BUN: 16 mg/dL (ref 6–20)
BUN: 11 mg/dL (ref 6–20)
BUN: 14 mg/dL (ref 6–20)
BUN: 9 mg/dL (ref 6–20)
CALCIUM ION: 1.07 mmol/L — AB (ref 1.15–1.40)
CHLORIDE: 104 mmol/L (ref 101–111)
CHLORIDE: 99 mmol/L — AB (ref 101–111)
CHLORIDE: 102 mmol/L (ref 101–111)
CREATININE: 0.7 mg/dL (ref 0.44–1.00)
CREATININE: 0.6 mg/dL (ref 0.44–1.00)
CREATININE: 0.7 mg/dL (ref 0.44–1.00)
CREATININE: 0.6 mg/dL (ref 0.44–1.00)
Calcium, Ion: 1.25 mmol/L (ref 1.15–1.40)
Calcium, Ion: 1.18 mmol/L (ref 1.15–1.40)
Calcium, Ion: 1.1 mmol/L — ABNORMAL LOW (ref 1.15–1.40)
Calcium, Ion: 1.06 mmol/L — ABNORMAL LOW (ref 1.15–1.40)
Calcium, Ion: 1.15 mmol/L (ref 1.15–1.40)
Chloride: 102 mmol/L (ref 101–111)
Chloride: 103 mmol/L (ref 101–111)
Chloride: 104 mmol/L (ref 101–111)
Creatinine, Ser: 0.7 mg/dL (ref 0.44–1.00)
Creatinine, Ser: 0.8 mg/dL (ref 0.44–1.00)
GLUCOSE: 233 mg/dL — AB (ref 65–99)
GLUCOSE: 229 mg/dL — AB (ref 65–99)
GLUCOSE: 130 mg/dL — AB (ref 65–99)
Glucose, Bld: 276 mg/dL — ABNORMAL HIGH (ref 65–99)
Glucose, Bld: 232 mg/dL — ABNORMAL HIGH (ref 65–99)
Glucose, Bld: 230 mg/dL — ABNORMAL HIGH (ref 65–99)
HCT: 29 % — ABNORMAL LOW (ref 36.0–46.0)
HCT: 37 % (ref 36.0–46.0)
HEMATOCRIT: 34 % — AB (ref 36.0–46.0)
HEMATOCRIT: 27 % — AB (ref 36.0–46.0)
HEMATOCRIT: 31 % — AB (ref 36.0–46.0)
HEMATOCRIT: 26 % — AB (ref 36.0–46.0)
HEMOGLOBIN: 11.6 g/dL — AB (ref 12.0–15.0)
HEMOGLOBIN: 10.5 g/dL — AB (ref 12.0–15.0)
HEMOGLOBIN: 8.8 g/dL — AB (ref 12.0–15.0)
HEMOGLOBIN: 12.6 g/dL (ref 12.0–15.0)
Hemoglobin: 9.2 g/dL — ABNORMAL LOW (ref 12.0–15.0)
Hemoglobin: 9.9 g/dL — ABNORMAL LOW (ref 12.0–15.0)
POTASSIUM: 4.1 mmol/L (ref 3.5–5.1)
POTASSIUM: 3.5 mmol/L (ref 3.5–5.1)
POTASSIUM: 4.8 mmol/L (ref 3.5–5.1)
Potassium: 4.7 mmol/L (ref 3.5–5.1)
Potassium: 4.2 mmol/L (ref 3.5–5.1)
Potassium: 3.9 mmol/L (ref 3.5–5.1)
SODIUM: 137 mmol/L (ref 135–145)
SODIUM: 137 mmol/L (ref 135–145)
SODIUM: 138 mmol/L (ref 135–145)
SODIUM: 137 mmol/L (ref 135–145)
Sodium: 138 mmol/L (ref 135–145)
Sodium: 138 mmol/L (ref 135–145)
TCO2: 24 mmol/L (ref 22–32)
TCO2: 24 mmol/L (ref 22–32)
TCO2: 25 mmol/L (ref 22–32)
TCO2: 24 mmol/L (ref 22–32)
TCO2: 25 mmol/L (ref 22–32)
TCO2: 23 mmol/L (ref 22–32)

## 2017-02-24 LAB — CREATININE, SERUM
Creatinine, Ser: 0.72 mg/dL (ref 0.44–1.00)
GFR calc Af Amer: >60 mL/min (ref >60)
GFR calc non Af Amer: >60 mL/min (ref >60)

## 2017-02-24 LAB — CBC
HCT: 35 % — ABNORMAL LOW (ref 36.0–46.0)
HCT: 37.5 % (ref 36.0–46.0)
HEMATOCRIT: 36.1 % (ref 36.0–46.0)
HEMOGLOBIN: 12.1 g/dL (ref 12.0–15.0)
Hemoglobin: 12.3 g/dL (ref 12.0–15.0)
Hemoglobin: 13.1 g/dL (ref 12.0–15.0)
MCH: 30.6 pg (ref 26.0–34.0)
MCH: 30.6 pg (ref 26.0–34.0)
MCH: 30.9 pg (ref 26.0–34.0)
MCHC: 34.1 g/dL (ref 30.0–36.0)
MCHC: 34.6 g/dL (ref 30.0–36.0)
MCHC: 34.9 g/dL (ref 30.0–36.0)
MCV: 89.8 fL (ref 78.0–100.0)
MCV: 88.4 fL (ref 78.0–100.0)
MCV: 88.4 fL (ref 78.0–100.0)
PLATELETS: 355 10*3/uL (ref 150–400)
Platelets: 246 10*3/uL (ref 150–400)
Platelets: 318 10*3/uL (ref 150–400)
RBC: 4.02 MIL/uL (ref 3.87–5.11)
RBC: 3.96 MIL/uL (ref 3.87–5.11)
RBC: 4.24 MIL/uL (ref 3.87–5.11)
RDW: 13 % (ref 11.5–15.5)
RDW: 12.6 % (ref 11.5–15.5)
RDW: 13 % (ref 11.5–15.5)
WBC: 10 10*3/uL (ref 4.0–10.5)
WBC: 17.1 10*3/uL — AB (ref 4.0–10.5)
WBC: 17.2 10*3/uL — ABNORMAL HIGH (ref 4.0–10.5)

## 2017-02-24 LAB — POCT I-STAT 3, ART BLOOD GAS (G3+)
ACID-BASE DEFICIT: 4 mmol/L — AB (ref 0.0–2.0)
ACID-BASE DEFICIT: 1 mmol/L (ref 0.0–2.0)
Acid-Base Excess: 1 mmol/L (ref 0.0–2.0)
Acid-Base Excess: 1 mmol/L (ref 0.0–2.0)
BICARBONATE: 25.5 mmol/L (ref 20.0–28.0)
BICARBONATE: 26.8 mmol/L (ref 20.0–28.0)
BICARBONATE: 23.1 mmol/L (ref 20.0–28.0)
Bicarbonate: 24.9 mmol/L (ref 20.0–28.0)
Bicarbonate: 26.2 mmol/L (ref 20.0–28.0)
Bicarbonate: 21.6 mmol/L (ref 20.0–28.0)
O2 SAT: 95 %
O2 SAT: 95 %
O2 SAT: 94 %
O2 Saturation: 100 %
O2 Saturation: 90 %
O2 Saturation: 96 %
PCO2 ART: 41.1 mmHg (ref 32.0–48.0)
PCO2 ART: 40.6 mmHg (ref 32.0–48.0)
PCO2 ART: 42.4 mmHg (ref 32.0–48.0)
PH ART: 7.391 (ref 7.350–7.450)
PH ART: 7.406 (ref 7.350–7.450)
PO2 ART: 280 mmHg — AB (ref 83.0–108.0)
PO2 ART: 60 mmHg — AB (ref 83.0–108.0)
PO2 ART: 82 mmHg — AB (ref 83.0–108.0)
TCO2: 26 mmol/L (ref 22–32)
TCO2: 27 mmol/L (ref 22–32)
TCO2: 27 mmol/L (ref 22–32)
TCO2: 28 mmol/L (ref 22–32)
TCO2: 23 mmol/L (ref 22–32)
TCO2: 24 mmol/L (ref 22–32)
pCO2 arterial: 49.4 mmHg — ABNORMAL HIGH (ref 32.0–48.0)
pCO2 arterial: 39.5 mmHg (ref 32.0–48.0)
pCO2 arterial: 37 mmHg (ref 32.0–48.0)
pH, Arterial: 7.399 (ref 7.350–7.450)
pH, Arterial: 7.341 — ABNORMAL LOW (ref 7.350–7.450)
pH, Arterial: 7.344 — ABNORMAL LOW (ref 7.350–7.450)
pH, Arterial: 7.408 (ref 7.350–7.450)
pO2, Arterial: 81 mmHg — ABNORMAL LOW (ref 83.0–108.0)
pO2, Arterial: 79 mmHg — ABNORMAL LOW (ref 83.0–108.0)
pO2, Arterial: 73 mmHg — ABNORMAL LOW (ref 83.0–108.0)

## 2017-02-24 LAB — PROTIME-INR
INR: 1.13
PROTHROMBIN TIME: 14.4 s (ref 11.4–15.2)

## 2017-02-24 LAB — GLUCOSE, CAPILLARY
GLUCOSE-CAPILLARY: 159 mg/dL — AB (ref 65–99)
GLUCOSE-CAPILLARY: 168 mg/dL — AB (ref 65–99)
Glucose-Capillary: 190 mg/dL — ABNORMAL HIGH (ref 65–99)
Glucose-Capillary: 191 mg/dL — ABNORMAL HIGH (ref 65–99)
Glucose-Capillary: 152 mg/dL — ABNORMAL HIGH (ref 65–99)

## 2017-02-24 LAB — POCT I-STAT 4, (NA,K, GLUC, HGB,HCT)
Glucose, Bld: 231 mg/dL — ABNORMAL HIGH (ref 65–99)
HCT: 34 % — ABNORMAL LOW (ref 36.0–46.0)
HEMOGLOBIN: 11.6 g/dL — AB (ref 12.0–15.0)
POTASSIUM: 4.2 mmol/L (ref 3.5–5.1)
SODIUM: 140 mmol/L (ref 135–145)

## 2017-02-24 LAB — MAGNESIUM: Magnesium: 2.6 mg/dL — ABNORMAL HIGH (ref 1.7–2.4)

## 2017-02-24 LAB — HEMOGLOBIN AND HEMATOCRIT, BLOOD
HCT: 26 % — ABNORMAL LOW (ref 36.0–46.0)
Hemoglobin: 9 g/dL — ABNORMAL LOW (ref 12.0–15.0)

## 2017-02-24 LAB — PLATELET COUNT: Platelets: 255 10*3/uL (ref 150–400)

## 2017-02-24 LAB — APTT: APTT: 24 s (ref 24–36)

## 2017-02-24 LAB — HEPARIN LEVEL (UNFRACTIONATED): Heparin Unfractionated: 0.57 IU/mL (ref 0.30–0.70)

## 2017-02-24 SURGERY — CORONARY ARTERY BYPASS GRAFTING (CABG)
Anesthesia: General | Site: Leg Upper | Laterality: Right

## 2017-02-24 MED ORDER — POTASSIUM CHLORIDE 10 MEQ/50ML IV SOLN: 10.0000 meq | INTRAVENOUS | Status: AC

## 2017-02-24 MED ORDER — SODIUM CHLORIDE 0.9 % IV SOLN: INTRAVENOUS | Status: DC

## 2017-02-24 MED ORDER — METOPROLOL TARTRATE 5 MG/5ML IV SOLN
2.5000 mg | INTRAVENOUS | Status: DC | PRN
  Administered 2017-02-26 – 2017-02-27 (×3): 2.5 mg via INTRAVENOUS
  Filled 2017-02-24 (×3): qty 5

## 2017-02-24 MED ORDER — ONDANSETRON HCL 4 MG/2ML IJ SOLN
4.0000 mg | Freq: Four times a day (QID) | INTRAMUSCULAR | Status: DC | PRN
  Administered 2017-02-25: 4 mg via INTRAVENOUS
  Filled 2017-02-24: qty 2

## 2017-02-24 MED ORDER — ARTIFICIAL TEARS OPHTHALMIC OINT
TOPICAL_OINTMENT | OPHTHALMIC | Status: DC | PRN
  Administered 2017-02-24: 1 via OPHTHALMIC

## 2017-02-24 MED ORDER — CEFUROXIME SODIUM 1.5 G IV SOLR
1.5000 g | Freq: Two times a day (BID) | INTRAVENOUS | Status: AC
  Administered 2017-02-24 – 2017-02-26 (×4): 1.5 g via INTRAVENOUS
  Filled 2017-02-24 (×4): qty 1.5

## 2017-02-24 MED ORDER — ALBUMIN HUMAN 5 % IV SOLN: 250.0000 mL | INTRAVENOUS | Status: AC | PRN

## 2017-02-24 MED ORDER — LEVALBUTEROL HCL 0.63 MG/3ML IN NEBU
0.6300 mg | INHALATION_SOLUTION | Freq: Four times a day (QID) | RESPIRATORY_TRACT | Status: DC
  Administered 2017-02-24 – 2017-02-27 (×12): 0.63 mg via RESPIRATORY_TRACT
  Filled 2017-02-24 (×15): qty 3

## 2017-02-24 MED ORDER — SODIUM CHLORIDE 0.9 % IJ SOLN
OROMUCOSAL | Status: DC | PRN
  Administered 2017-02-24 (×3): via TOPICAL

## 2017-02-24 MED ORDER — SODIUM CHLORIDE 0.9% FLUSH
3.0000 mL | Freq: Two times a day (BID) | INTRAVENOUS | Status: DC
Start: 2017-02-25 — End: 2017-02-27
  Administered 2017-02-25 – 2017-02-26 (×3): 3 mL via INTRAVENOUS

## 2017-02-24 MED ORDER — DOPAMINE-DEXTROSE 3.2-5 MG/ML-% IV SOLN: 2.5000 ug/kg/min | INTRAVENOUS | Status: DC

## 2017-02-24 MED ORDER — LACTATED RINGERS IV SOLN: INTRAVENOUS | Status: DC

## 2017-02-24 MED ORDER — ACETAMINOPHEN 160 MG/5ML PO SOLN: 650.0000 mg | Freq: Once | ORAL | Status: AC

## 2017-02-24 MED ORDER — ALBUTEROL SULFATE HFA 108 (90 BASE) MCG/ACT IN AERS
INHALATION_SPRAY | RESPIRATORY_TRACT | Status: DC | PRN
  Administered 2017-02-24 (×2): 8 via RESPIRATORY_TRACT

## 2017-02-24 MED ORDER — PHENYLEPHRINE HCL 10 MG/ML IJ SOLN
0.0000 ug/min | INTRAMUSCULAR | Status: DC
  Filled 2017-02-24: qty 2

## 2017-02-24 MED ORDER — MORPHINE SULFATE (PF) 4 MG/ML IV SOLN
1.0000 mg | INTRAVENOUS | Status: DC | PRN
  Administered 2017-02-24 (×2): 2 mg via INTRAVENOUS
  Filled 2017-02-24: qty 1

## 2017-02-24 MED ORDER — NITROGLYCERIN IN D5W 200-5 MCG/ML-% IV SOLN: 0.0000 ug/min | INTRAVENOUS | Status: DC

## 2017-02-24 MED ORDER — 0.9 % SODIUM CHLORIDE (POUR BTL) OPTIME
TOPICAL | Status: DC | PRN
  Administered 2017-02-24: 6000 mL

## 2017-02-24 MED ORDER — LACTATED RINGERS IV SOLN: 500.0000 mL | Freq: Once | INTRAVENOUS | Status: DC | PRN

## 2017-02-24 MED ORDER — FAMOTIDINE IN NACL 20-0.9 MG/50ML-% IV SOLN
20.0000 mg | Freq: Two times a day (BID) | INTRAVENOUS | Status: DC
  Administered 2017-02-24: 20 mg via INTRAVENOUS

## 2017-02-24 MED ORDER — ASPIRIN EC 325 MG PO TBEC
325.0000 mg | DELAYED_RELEASE_TABLET | Freq: Every day | ORAL | Status: DC
  Administered 2017-02-25 – 2017-03-03 (×6): 325 mg via ORAL
  Filled 2017-02-24 (×7): qty 1

## 2017-02-24 MED ORDER — INSULIN REGULAR BOLUS VIA INFUSION
0.0000 [IU] | Freq: Three times a day (TID) | INTRAVENOUS | Status: DC
  Filled 2017-02-24: qty 10

## 2017-02-24 MED ORDER — LACTATED RINGERS IV SOLN
INTRAVENOUS | Status: DC | PRN
  Administered 2017-02-24: 07:00:00 via INTRAVENOUS

## 2017-02-24 MED ORDER — THROMBIN (RECOMBINANT) 5000 UNITS EX SOLR
CUTANEOUS | Status: AC
Start: 2017-02-24 — End: ?
  Filled 2017-02-24: qty 5000

## 2017-02-24 MED ORDER — SODIUM CHLORIDE 0.9% FLUSH: 3.0000 mL | INTRAVENOUS | Status: DC | PRN

## 2017-02-24 MED ORDER — LIDOCAINE 2% (20 MG/ML) 5 ML SYRINGE
Freq: Once | INTRAMUSCULAR | Status: AC
  Administered 2017-02-24: 15:00:00 via INTRAVENOUS
  Filled 2017-02-24 (×3): qty 5

## 2017-02-24 MED ORDER — VANCOMYCIN HCL IN DEXTROSE 1-5 GM/200ML-% IV SOLN
1000.0000 mg | Freq: Once | INTRAVENOUS | Status: AC
  Administered 2017-02-24: 1000 mg via INTRAVENOUS
  Filled 2017-02-24: qty 200

## 2017-02-24 MED ORDER — FENTANYL CITRATE (PF) 250 MCG/5ML IJ SOLN
INTRAMUSCULAR | Status: AC
  Filled 2017-02-24: qty 25

## 2017-02-24 MED ORDER — HEPARIN SODIUM (PORCINE) 1000 UNIT/ML IJ SOLN
INTRAMUSCULAR | Status: DC | PRN
  Administered 2017-02-24: 29000 [IU] via INTRAVENOUS

## 2017-02-24 MED ORDER — DEXMEDETOMIDINE HCL 200 MCG/2ML IV SOLN: 0.0000 ug/kg/h | INTRAVENOUS | Status: DC

## 2017-02-24 MED ORDER — HEMOSTATIC AGENTS (NO CHARGE) OPTIME
TOPICAL | Status: DC | PRN
  Administered 2017-02-24 (×2): 1 via TOPICAL

## 2017-02-24 MED ORDER — FENTANYL CITRATE (PF) 250 MCG/5ML IJ SOLN
INTRAMUSCULAR | Status: DC | PRN
  Administered 2017-02-24: 50 ug via INTRAVENOUS
  Administered 2017-02-24: 100 ug via INTRAVENOUS
  Administered 2017-02-24: 150 ug via INTRAVENOUS
  Administered 2017-02-24 (×3): 100 ug via INTRAVENOUS
  Administered 2017-02-24: 450 ug via INTRAVENOUS
  Administered 2017-02-24 (×3): 100 ug via INTRAVENOUS

## 2017-02-24 MED ORDER — MORPHINE SULFATE (PF) 2 MG/ML IV SOLN: 1.0000 mg | INTRAVENOUS | Status: DC | PRN

## 2017-02-24 MED ORDER — METOCLOPRAMIDE HCL 5 MG/ML IJ SOLN
10.0000 mg | Freq: Four times a day (QID) | INTRAMUSCULAR | Status: DC
Start: 2017-02-24 — End: 2017-02-25
  Administered 2017-02-24 – 2017-02-25 (×3): 10 mg via INTRAVENOUS
  Filled 2017-02-24 (×2): qty 2

## 2017-02-24 MED ORDER — ROCURONIUM BROMIDE 10 MG/ML (PF) SYRINGE
PREFILLED_SYRINGE | INTRAVENOUS | Status: AC
  Filled 2017-02-24: qty 20

## 2017-02-24 MED ORDER — THROMBIN 5000 UNITS EX SOLR
CUTANEOUS | Status: DC | PRN
  Administered 2017-02-24: 5000 [IU] via TOPICAL

## 2017-02-24 MED ORDER — MIDAZOLAM HCL 2 MG/2ML IJ SOLN
2.0000 mg | INTRAMUSCULAR | Status: DC | PRN
  Administered 2017-02-24: 2 mg via INTRAVENOUS
  Filled 2017-02-24: qty 2

## 2017-02-24 MED ORDER — PROPOFOL 10 MG/ML IV BOLUS
INTRAVENOUS | Status: AC
  Filled 2017-02-24: qty 20

## 2017-02-24 MED ORDER — MORPHINE SULFATE (PF) 2 MG/ML IV SOLN: 2.0000 mg | INTRAVENOUS | Status: DC | PRN

## 2017-02-24 MED ORDER — METOPROLOL TARTRATE 12.5 MG HALF TABLET
12.5000 mg | ORAL_TABLET | Freq: Two times a day (BID) | ORAL | Status: DC
  Administered 2017-02-25 (×2): 12.5 mg via ORAL
  Filled 2017-02-24 (×3): qty 1

## 2017-02-24 MED ORDER — LACTATED RINGERS IV SOLN
INTRAVENOUS | Status: DC
  Administered 2017-02-25: 11:00:00 via INTRAVENOUS

## 2017-02-24 MED ORDER — SODIUM CHLORIDE 0.9 % IV SOLN: 250.0000 mL | INTRAVENOUS | Status: DC

## 2017-02-24 MED ORDER — METOPROLOL TARTRATE 25 MG/10 ML ORAL SUSPENSION: 12.5000 mg | Freq: Two times a day (BID) | ORAL | Status: DC

## 2017-02-24 MED ORDER — ROCURONIUM BROMIDE 10 MG/ML (PF) SYRINGE
PREFILLED_SYRINGE | INTRAVENOUS | Status: DC | PRN
  Administered 2017-02-24 (×4): 50 mg via INTRAVENOUS

## 2017-02-24 MED ORDER — MOVING RIGHT ALONG BOOK
Freq: Once | Status: AC
  Administered 2017-02-24
  Filled 2017-02-24: qty 1

## 2017-02-24 MED ORDER — PROTAMINE SULFATE 10 MG/ML IV SOLN
INTRAVENOUS | Status: DC | PRN
  Administered 2017-02-24: 250 mg via INTRAVENOUS

## 2017-02-24 MED ORDER — BISACODYL 5 MG PO TBEC
10.0000 mg | DELAYED_RELEASE_TABLET | Freq: Every day | ORAL | Status: DC
  Administered 2017-02-25 – 2017-02-26 (×2): 10 mg via ORAL
  Filled 2017-02-24 (×2): qty 2

## 2017-02-24 MED ORDER — SODIUM CHLORIDE 0.9 % IV SOLN
INTRAVENOUS | Status: DC
  Administered 2017-02-24: 8.2 [IU]/h via INTRAVENOUS
  Filled 2017-02-24: qty 1

## 2017-02-24 MED ORDER — FENTANYL CITRATE (PF) 250 MCG/5ML IJ SOLN
INTRAMUSCULAR | Status: AC
  Filled 2017-02-24: qty 5

## 2017-02-24 MED ORDER — PROPOFOL 10 MG/ML IV BOLUS
INTRAVENOUS | Status: DC | PRN
  Administered 2017-02-24: 20 mg via INTRAVENOUS

## 2017-02-24 MED ORDER — ACETAMINOPHEN 500 MG PO TABS
1000.0000 mg | ORAL_TABLET | Freq: Four times a day (QID) | ORAL | Status: DC
  Administered 2017-02-24 – 2017-02-27 (×10): 1000 mg via ORAL
  Filled 2017-02-24 (×10): qty 2

## 2017-02-24 MED ORDER — ALBUMIN HUMAN 5 % IV SOLN
INTRAVENOUS | Status: DC | PRN
  Administered 2017-02-24: 12:00:00 via INTRAVENOUS

## 2017-02-24 MED ORDER — ORAL CARE MOUTH RINSE
15.0000 mL | OROMUCOSAL | Status: DC
Start: 2017-02-24 — End: 2017-02-24
  Administered 2017-02-24 (×2): 15 mL via OROMUCOSAL

## 2017-02-24 MED ORDER — ACETAMINOPHEN 650 MG RE SUPP
650.0000 mg | Freq: Once | RECTAL | Status: AC
  Administered 2017-02-24: 650 mg via RECTAL

## 2017-02-24 MED ORDER — HEPARIN SODIUM (PORCINE) 1000 UNIT/ML IJ SOLN
INTRAMUSCULAR | Status: AC
  Filled 2017-02-24: qty 1

## 2017-02-24 MED ORDER — TRAMADOL HCL 50 MG PO TABS
50.0000 mg | ORAL_TABLET | ORAL | Status: DC | PRN
  Administered 2017-02-25: 100 mg via ORAL
  Filled 2017-02-24: qty 2

## 2017-02-24 MED ORDER — MORPHINE SULFATE (PF) 4 MG/ML IV SOLN
2.0000 mg | INTRAVENOUS | Status: DC | PRN
  Administered 2017-02-25 (×2): 2 mg via INTRAVENOUS
  Filled 2017-02-24: qty 1

## 2017-02-24 MED ORDER — CHLORHEXIDINE GLUCONATE 0.12% ORAL RINSE (MEDLINE KIT)
15.0000 mL | Freq: Two times a day (BID) | OROMUCOSAL | Status: DC
  Administered 2017-02-24 – 2017-02-26 (×4): 15 mL via OROMUCOSAL

## 2017-02-24 MED ORDER — PROTAMINE SULFATE 10 MG/ML IV SOLN
INTRAVENOUS | Status: AC
Start: 2017-02-24 — End: ?
  Filled 2017-02-24: qty 25

## 2017-02-24 MED ORDER — ACETAMINOPHEN 160 MG/5ML PO SOLN: 1000.0000 mg | Freq: Four times a day (QID) | ORAL | Status: DC

## 2017-02-24 MED ORDER — MIDAZOLAM HCL 5 MG/5ML IJ SOLN
INTRAMUSCULAR | Status: DC | PRN
  Administered 2017-02-24 (×2): 1 mg via INTRAVENOUS
  Administered 2017-02-24: 2 mg via INTRAVENOUS
  Administered 2017-02-24: 3 mg via INTRAVENOUS

## 2017-02-24 MED ORDER — SODIUM CHLORIDE 0.45 % IV SOLN: INTRAVENOUS | Status: DC | PRN

## 2017-02-24 MED ORDER — DOCUSATE SODIUM 100 MG PO CAPS
200.0000 mg | ORAL_CAPSULE | Freq: Every day | ORAL | Status: DC
  Administered 2017-02-25 – 2017-02-26 (×2): 200 mg via ORAL
  Filled 2017-02-24 (×2): qty 2

## 2017-02-24 MED ORDER — NITROGLYCERIN 0.2 MG/ML ON CALL CATH LAB
INTRAVENOUS | Status: DC | PRN
  Administered 2017-02-24 (×2): 10 ug via INTRAVENOUS

## 2017-02-24 MED ORDER — OXYCODONE HCL 5 MG PO TABS: 5.0000 mg | ORAL_TABLET | ORAL | Status: DC | PRN

## 2017-02-24 MED ORDER — CHLORHEXIDINE GLUCONATE 0.12 % MT SOLN
15.0000 mL | OROMUCOSAL | Status: AC
  Administered 2017-02-24: 15 mL via OROMUCOSAL

## 2017-02-24 MED ORDER — PROTAMINE SULFATE 10 MG/ML IV SOLN
INTRAVENOUS | Status: AC
  Filled 2017-02-24: qty 5

## 2017-02-24 MED ORDER — ORAL CARE MOUTH RINSE
15.0000 mL | Freq: Two times a day (BID) | OROMUCOSAL | Status: DC
  Administered 2017-02-25: 15 mL via OROMUCOSAL

## 2017-02-24 MED ORDER — MAGNESIUM SULFATE 4 GM/100ML IV SOLN
4.0000 g | Freq: Once | INTRAVENOUS | Status: AC
  Administered 2017-02-24: 4 g via INTRAVENOUS
  Filled 2017-02-24: qty 100

## 2017-02-24 MED ORDER — PANTOPRAZOLE SODIUM 40 MG PO TBEC
40.0000 mg | DELAYED_RELEASE_TABLET | Freq: Every day | ORAL | Status: DC
  Administered 2017-02-26: 40 mg via ORAL
  Filled 2017-02-24: qty 1

## 2017-02-24 MED ORDER — MIDAZOLAM HCL 10 MG/2ML IJ SOLN
INTRAMUSCULAR | Status: AC
  Filled 2017-02-24: qty 2

## 2017-02-24 MED ORDER — ASPIRIN 81 MG PO CHEW
324.0000 mg | CHEWABLE_TABLET | Freq: Every day | ORAL | Status: DC
  Administered 2017-03-02: 324 mg
  Filled 2017-02-24 (×3): qty 4

## 2017-02-24 MED ORDER — BISACODYL 10 MG RE SUPP: 10.0000 mg | Freq: Every day | RECTAL | Status: DC

## 2017-02-24 SURGICAL SUPPLY — 99 items
ADH SKN CLS APL DERMABOND .7 (GAUZE/BANDAGES/DRESSINGS) ×3
AGENT HMST MTR 8 SURGIFLO (HEMOSTASIS) ×3
APPLICATOR COTTON TIP 6IN STRL (MISCELLANEOUS) ×1 IMPLANT
BAG DECANTER FOR FLEXI CONT (MISCELLANEOUS) ×4 IMPLANT
BANDAGE ACE 4X5 VEL STRL LF (GAUZE/BANDAGES/DRESSINGS) ×4 IMPLANT
BANDAGE ACE 6X5 VEL STRL LF (GAUZE/BANDAGES/DRESSINGS) ×4 IMPLANT
BLADE NDL 3 SS STRL (BLADE) IMPLANT
BLADE NEEDLE 3 SS STRL (BLADE) ×4 IMPLANT
BLADE STERNUM SYSTEM 6 (BLADE) ×4 IMPLANT
BLADE SURG 11 STRL SS (BLADE) ×1 IMPLANT
BNDG GAUZE ELAST 4 BULKY (GAUZE/BANDAGES/DRESSINGS) ×4 IMPLANT
CABLE PACING FASLOC BLUE (MISCELLANEOUS) ×2 IMPLANT
CANISTER SUCT 3000ML PPV (MISCELLANEOUS) ×4 IMPLANT
CATH CPB KIT GERHARDT (MISCELLANEOUS) ×4 IMPLANT
CATH THORACIC 28FR (CATHETERS) ×4 IMPLANT
CRADLE DONUT ADULT HEAD (MISCELLANEOUS) ×4 IMPLANT
DERMABOND ADVANCED (GAUZE/BANDAGES/DRESSINGS) ×1
DERMABOND ADVANCED .7 DNX12 (GAUZE/BANDAGES/DRESSINGS) IMPLANT
DRAIN CHANNEL 28F RND 3/8 FF (WOUND CARE) ×4 IMPLANT
DRAPE CARDIOVASCULAR INCISE (DRAPES) ×4
DRAPE SLUSH/WARMER DISC (DRAPES) ×4 IMPLANT
DRAPE SRG 135X102X78XABS (DRAPES) ×3 IMPLANT
DRSG AQUACEL AG ADV 3.5X14 (GAUZE/BANDAGES/DRESSINGS) ×4 IMPLANT
ELECT BLADE 4.0 EZ CLEAN MEGAD (MISCELLANEOUS) ×4
ELECT REM PT RETURN 9FT ADLT (ELECTROSURGICAL) ×8
ELECTRODE BLDE 4.0 EZ CLN MEGD (MISCELLANEOUS) ×3 IMPLANT
ELECTRODE REM PT RTRN 9FT ADLT (ELECTROSURGICAL) ×6 IMPLANT
FELT TEFLON 1X6 (MISCELLANEOUS) ×6 IMPLANT
GAUZE SPONGE 4X4 12PLY STRL (GAUZE/BANDAGES/DRESSINGS) ×8 IMPLANT
GAUZE SPONGE 4X4 12PLY STRL LF (GAUZE/BANDAGES/DRESSINGS) ×2 IMPLANT
GLOVE BIO SURGEON STRL SZ 6 (GLOVE) ×2 IMPLANT
GLOVE BIO SURGEON STRL SZ 6.5 (GLOVE) ×14 IMPLANT
GLOVE BIOGEL PI IND STRL 6 (GLOVE) IMPLANT
GLOVE BIOGEL PI IND STRL 6.5 (GLOVE) IMPLANT
GLOVE BIOGEL PI INDICATOR 6 (GLOVE) ×1
GLOVE BIOGEL PI INDICATOR 6.5 (GLOVE) ×3
GOWN STRL REUS W/ TWL LRG LVL3 (GOWN DISPOSABLE) ×15 IMPLANT
GOWN STRL REUS W/TWL LRG LVL3 (GOWN DISPOSABLE) ×28
HEMOSTAT POWDER SURGIFOAM 1G (HEMOSTASIS) ×12 IMPLANT
HEMOSTAT SURGICEL 2X14 (HEMOSTASIS) ×4 IMPLANT
KIT BASIN OR (CUSTOM PROCEDURE TRAY) ×4 IMPLANT
KIT CATH SUCT 8FR (CATHETERS) ×4 IMPLANT
KIT ROOM TURNOVER OR (KITS) ×4 IMPLANT
KIT SUCTION CATH 14FR (SUCTIONS) ×8 IMPLANT
KIT VASOVIEW HEMOPRO VH 3000 (KITS) ×4 IMPLANT
LEAD PACING MYOCARDI (MISCELLANEOUS) ×4 IMPLANT
MARKER GRAFT CORONARY BYPASS (MISCELLANEOUS) ×12 IMPLANT
NS IRRIG 1000ML POUR BTL (IV SOLUTION) ×22 IMPLANT
PACK OPEN HEART (CUSTOM PROCEDURE TRAY) ×4 IMPLANT
PAD ARMBOARD 7.5X6 YLW CONV (MISCELLANEOUS) ×8 IMPLANT
PAD ELECT DEFIB RADIOL ZOLL (MISCELLANEOUS) ×4 IMPLANT
PENCIL BUTTON HOLSTER BLD 10FT (ELECTRODE) ×4 IMPLANT
PUNCH AORTIC ROTATE  4.5MM 8IN (MISCELLANEOUS) ×1 IMPLANT
SET CARDIOPLEGIA MPS 5001102 (MISCELLANEOUS) ×2 IMPLANT
SOLUTION ANTI FOG 6CC (MISCELLANEOUS) ×1 IMPLANT
SPOGE SURGIFLO 8M (HEMOSTASIS) ×1
SPONGE LAP 18X18 X RAY DECT (DISPOSABLE) ×2 IMPLANT
SPONGE SURGIFLO 8M (HEMOSTASIS) IMPLANT
SUT BONE WAX W31G (SUTURE) ×4 IMPLANT
SUT ETHIBOND 2 0 SH (SUTURE) ×16
SUT ETHIBOND 2 0 SH 36X2 (SUTURE) ×4 IMPLANT
SUT MNCRL AB 4-0 PS2 18 (SUTURE) ×1 IMPLANT
SUT PROLENE 3 0 SH1 36 (SUTURE) ×4 IMPLANT
SUT PROLENE 4 0 RB 1 (SUTURE) ×4
SUT PROLENE 4 0 TF (SUTURE) ×8 IMPLANT
SUT PROLENE 4-0 RB1 .5 CRCL 36 (SUTURE) ×1 IMPLANT
SUT PROLENE 5 0 C 1 36 (SUTURE) ×4 IMPLANT
SUT PROLENE 6 0 C 1 30 (SUTURE) ×2 IMPLANT
SUT PROLENE 6 0 CC (SUTURE) ×9 IMPLANT
SUT PROLENE 7 0 BV1 MDA (SUTURE) ×6 IMPLANT
SUT PROLENE 8 0 BV175 6 (SUTURE) ×4 IMPLANT
SUT SILK  1 MH (SUTURE) ×3
SUT SILK 1 MH (SUTURE) IMPLANT
SUT SILK 1 TIES 10X30 (SUTURE) ×2 IMPLANT
SUT SILK 2 0 SH CR/8 (SUTURE) ×2 IMPLANT
SUT SILK 2 0 TIES 10X30 (SUTURE) ×2 IMPLANT
SUT SILK 2 0 TIES 17X18 (SUTURE) ×4
SUT SILK 2-0 18XBRD TIE BLK (SUTURE) ×1 IMPLANT
SUT SILK 3 0 SH CR/8 (SUTURE) ×2 IMPLANT
SUT SILK 4 0 TIE 10X30 (SUTURE) ×2 IMPLANT
SUT STEEL 6MS V (SUTURE) ×4 IMPLANT
SUT STEEL SZ 6 DBL 3X14 BALL (SUTURE) ×4 IMPLANT
SUT TEM PAC WIRE 2 0 SH (SUTURE) ×4 IMPLANT
SUT VIC AB 1 CTX 18 (SUTURE) ×8 IMPLANT
SUT VIC AB 2-0 CT1 27 (SUTURE) ×4
SUT VIC AB 2-0 CT1 TAPERPNT 27 (SUTURE) IMPLANT
SUT VIC AB 2-0 CTX 27 (SUTURE) ×2 IMPLANT
SUT VIC AB 3-0 X1 27 (SUTURE) ×4 IMPLANT
SUTURE E-PAK OPEN HEART (SUTURE) ×3 IMPLANT
SYSTEM SAHARA CHEST DRAIN ATS (WOUND CARE) ×4 IMPLANT
TAPE CLOTH SURG 4X10 WHT LF (GAUZE/BANDAGES/DRESSINGS) ×2 IMPLANT
TOWEL GREEN STERILE (TOWEL DISPOSABLE) ×12 IMPLANT
TOWEL GREEN STERILE FF (TOWEL DISPOSABLE) ×6 IMPLANT
TOWEL OR 17X24 6PK STRL BLUE (TOWEL DISPOSABLE) ×6 IMPLANT
TOWEL OR 17X26 10 PK STRL BLUE (TOWEL DISPOSABLE) ×6 IMPLANT
TRAY FOLEY SILVER 16FR TEMP (SET/KITS/TRAYS/PACK) ×4 IMPLANT
TUBING INSUFFLATION (TUBING) ×4 IMPLANT
UNDERPAD 30X30 (UNDERPADS AND DIAPERS) ×4 IMPLANT
WATER STERILE IRR 1000ML POUR (IV SOLUTION) ×8 IMPLANT

## 2017-02-24 NOTE — Progress Notes (Signed)
02/24/2017 1400 Upon arrival to ICU, noted no OG tube in place. Verbal order Dr. Servando Snare ok to leave out. Orders enacted.  Theresa Ray, Arville Lime

## 2017-02-24 NOTE — Progress Notes (Addendum)
02/24/2017 1545 Pt. Waking up on ventilator, mild agitation, during this time pt. Had loss of capture of external pacemaker for approximately 10 seconds Immediately returning to AAI paced at 90. Prn versed given to patient per protocol, sedation increased. Dr. Servando Snare paged and made aware. External pacemaker settings changed to DDD at a rate of 94 per verbal order Dr. Servando Snare. Telemetry strip saved, printed and placed in chart. Will continue to closely monitor patient.  Paulita Licklider, Arville Lime

## 2017-02-24 NOTE — Brief Op Note (Addendum)
      [address]       [place] [address]             [phone]      02/24/2017  11:46 AM  PATIENT:  Theresa Ray  71 y.o. female  PRE-OPERATIVE DIAGNOSIS:  CAD  POST-OPERATIVE DIAGNOSIS:  CAD  PROCEDURE:  Procedure(s):  CORONARY ARTERY BYPASS GRAFTING x 3 -LIMA to LAD -SVG to OM 1 -SVG to PDA  ENDOSCOPIC HARVEST GREATER SAPHENOUS VEIN -Right Leg  TRANSESOPHAGEAL ECHOCARDIOGRAM (TEE) (N/A)  SURGEON:  Surgeon(s) and Role:    * Grace Isaac, MD - Primary  PHYSICIAN ASSISTANT: Ellwood Handler PA-C  ANESTHESIA:   general  EBL:    BLOOD ADMINISTERED: CELLSAVER  DRAINS: Left Pleural Chest Tube, Mediastinal Chest Drains   LOCAL MEDICATIONS USED:  NONE  SPECIMEN:  No Specimen  DISPOSITION OF SPECIMEN:  N/A  COUNTS:  YES   DICTATION: .Dragon Dictation  PLAN OF CARE: Admit to inpatient   PATIENT DISPOSITION:  ICU - intubated and hemodynamically stable.   Delay start of Pharmacological VTE agent (>24hrs) due to surgical blood loss or risk of bleeding: yes

## 2017-02-24 NOTE — Progress Notes (Addendum)
02/24/2017 2:53 PM Dr. Servando Snare at bedside and made aware of increased ventricular ectopy. Verbal orders to give 75 mg IV push lidocaine 2%  from heart box. Orders enacted. Will continue to closely monitor patient.  Theresa Ray, Arville Lime

## 2017-02-24 NOTE — Progress Notes (Signed)
02/24/2017 2:37 PM Dr. Servando Snare paged and made aware of arrival ABG, orders received to increase RR to 16 and Tidal Volume to 500. RT updated on new orders. Orders enacted. Will continue to closely monitor patient.  Anmol Fleck, Arville Lime

## 2017-02-24 NOTE — Anesthesia Procedure Notes (Signed)
Procedure Name: Intubation Date/Time: 02/24/2017 7:59 AM Performed by: Candis Shine, CRNA Pre-anesthesia Checklist: Patient identified, Emergency Drugs available, Suction available and Patient being monitored Patient Re-evaluated:Patient Re-evaluated prior to induction Oxygen Delivery Method: Circle System Utilized Preoxygenation: Pre-oxygenation with 100% oxygen Induction Type: IV induction Ventilation: Mask ventilation without difficulty and Oral airway inserted - appropriate to patient size Laryngoscope Size: Glidescope and 3 Grade View: Grade I Tube type: Oral Tube size: 8.0 mm Number of attempts: 1 Airway Equipment and Method: Stylet and Oral airway Placement Confirmation: ETT inserted through vocal cords under direct vision,  positive ETCO2 and breath sounds checked- equal and bilateral Secured at: 22 cm Tube secured with: Tape Dental Injury: Teeth and Oropharynx as per pre-operative assessment  Difficulty Due To: Difficult Airway- due to limited oral opening

## 2017-02-24 NOTE — Anesthesia Procedure Notes (Signed)
Central Venous Catheter Insertion Performed by: Annye Asa, MD, anesthesiologist Start/End11/26/2018 6:54 AM, 02/24/2017 7:16 AM Patient location: Pre-op. Preanesthetic checklist: patient identified, IV checked, risks and benefits discussed, surgical consent, monitors and equipment checked, pre-op evaluation, timeout performed and anesthesia consent Position: supine Lidocaine 1% used for infiltration and patient sedated Hand hygiene performed , maximum sterile barriers used  and Seldinger technique used Catheter size: 8.5 Fr PA cath was placed.Sheath introducer Swan type:thermodilution Procedure performed using ultrasound guided technique. Ultrasound Notes:anatomy identified, needle tip was noted to be adjacent to the nerve/plexus identified, no ultrasound evidence of intravascular and/or intraneural injection and image(s) printed for medical record Attempts: 1 Following insertion, line sutured, dressing applied and Biopatch. Post procedure assessment: blood return through all ports, free fluid flow and no air  Patient tolerated the procedure well with no immediate complications. Additional procedure comments: PA catheter:  Routine monitors. Timeout, sterile prep, drape, FBP R neck.  Supine position.  1% Lido local, finder and trocar RIJ 1st pass with US guidance.  Cordis placed over J wire. PA catheter in easily.  Sterile dressing applied.  Patient tolerated well, VSS.  Jenita Seashore, MD.

## 2017-02-24 NOTE — Progress Notes (Signed)
Pre Procedure note for inpatients:   Theresa Ray has been scheduled for Procedure(s): CORONARY ARTERY BYPASS GRAFTING (CABG) (N/A) TRANSESOPHAGEAL ECHOCARDIOGRAM (TEE) (N/A) today. The various methods of treatment have been discussed with the patient. After consideration of the risks, benefits and treatment options the patient has consented to the planned procedure.   The patient has been seen and labs reviewed. There are no changes in the patient's condition to prevent proceeding with the planned procedure today.  Recent labs:  Lab Results  Component Value Date   WBC 10.0 02/24/2017   HGB 12.3 02/24/2017   HCT 36.1 02/24/2017   PLT 355 02/24/2017   GLUCOSE 252 (H) 02/22/2017   CHOL 125 02/17/2017   TRIG 358 (H) 02/17/2017   HDL 30 (L) 02/17/2017   LDLCALC 23 02/17/2017   ALT 20 02/19/2017   AST 19 02/19/2017   NA 136 02/22/2017   K 4.1 02/22/2017   CL 104 02/22/2017   CREATININE 0.91 02/22/2017   BUN 10 02/22/2017   CO2 22 02/22/2017   TSH 0.950 02/17/2017   INR 1.00 02/19/2017   HGBA1C 9.0 (H) 02/19/2017    Grace Isaac, MD 02/24/2017 7:16 AM

## 2017-02-24 NOTE — Procedures (Signed)
Extubation Procedure Note  Patient Details:   Name: Theresa Ray DOB: 1945-04-28 MRN: [id number]   Airway Documentation:  Airway 8 mm (Active)  Secured at (cm) 22 cm 02/24/2017  8:00 PM  Measured From Lips 02/24/2017  8:00 PM  Secured Location Right 02/24/2017  8:00 PM  Secured By Pink Tape 02/24/2017  8:00 PM  Site Condition Dry 02/24/2017  8:00 PM    Evaluation  O2 sats: stable throughout Complications: No apparent complications Patient did tolerate procedure well. Bilateral Breath Sounds: Clear   Yes  Chriss Driver [facility] 02/24/2017, 9:48 PM

## 2017-02-24 NOTE — Transfer of Care (Signed)
Immediate Anesthesia Transfer of Care Note  Patient: Theresa Ray  Procedure(s) Performed: CORONARY ARTERY BYPASS GRAFTING (CABG) USING LEFT INTERNAL MAMMARY ARTERY TO LAD AND ENDOSCOPICALLY HARVESTED GREATER SAPHENOUS VEIN TO PDA AND TO OM1. (N/A Chest) TRANSESOPHAGEAL ECHOCARDIOGRAM (TEE) (N/A ) ENDOVEIN HARVEST OF GREATER SAPHENOUS VEIN (Right Leg Upper)  Patient Location: ICU  Anesthesia Type:General  Level of Consciousness: sedated and Patient remains intubated per anesthesia plan  Airway & Oxygen Therapy: Patient remains intubated per anesthesia plan and Patient placed on Ventilator (see vital sign flow sheet for setting)  Post-op Assessment: Report given to RN and Post -op Vital signs reviewed and stable  Post vital signs: Reviewed and stable  Last Vitals:  Vitals:   02/24/17 0721 02/24/17 0722  BP:    Pulse: 64 65  Resp: 12 16  Temp:    SpO2: 96% 97%    Last Pain:  Vitals:   02/24/17 0458  TempSrc: Oral  PainSc:       Patients Stated Pain Goal: 0 (99/77/41 4239)  Complications: No apparent anesthesia complications

## 2017-02-24 NOTE — Progress Notes (Signed)
  Echocardiogram 2D Echocardiogram has been performed.  Theresa Ray 02/24/2017, 8:35 AM

## 2017-02-24 NOTE — Progress Notes (Signed)
Patient ID: Theresa Ray, female   DOB: 09-04-1945, 71 y.o.   MRN: [id number] EVENING ROUNDS NOTE :     [address]       [place] [address]             [phone]                 Day of Surgery Procedure(s) (LRB): CORONARY ARTERY BYPASS GRAFTING (CABG) USING LEFT INTERNAL MAMMARY ARTERY TO LAD AND ENDOSCOPICALLY HARVESTED GREATER SAPHENOUS VEIN TO PDA AND TO OM1. (N/A) TRANSESOPHAGEAL ECHOCARDIOGRAM (TEE) (N/A) ENDOVEIN HARVEST OF GREATER SAPHENOUS VEIN (Right)  Total Length of Stay:  LOS: 8 days  BP 107/73   Pulse 94   Temp 99.1 F (37.3 C)   Resp 16   Ht 5' (1.524 m)   Wt 209 lb (94.8 kg)   LMP  (LMP Unknown)   SpO2 97%   BMI 40.82 kg/m   .Intake/Output      11/25 0701 - 11/26 0700 11/26 0701 - 11/27 0700   P.O.     I.V. (mL/kg)  2326.3 (24.5)   Blood  575   IV Piggyback  250   Total Intake(mL/kg)  3151.3 (33.2)   Urine (mL/kg/hr) 0 (0) 1675 (1.5)   Stool 0    Blood  1110   Chest Tube  70   Total Output 0 2855   Net 0 +296.3        Urine Occurrence 1 x      . sodium chloride 20 mL/hr at 02/24/17 1800  . [START ON 02/25/2017] sodium chloride    . sodium chloride 20 mL/hr (02/24/17 1407)  . albumin human    . cefUROXime (ZINACEF)  IV    . dexmedetomidine (PRECEDEX) IV infusion 0.2 mcg/kg/hr (02/24/17 1841)  . DOPamine 2.5 mcg/kg/min (02/24/17 1701)  . famotidine (PEPCID) IV Stopped (02/24/17 1435)  . insulin (NOVOLIN-R) infusion 8.2 Units/hr (02/24/17 1802)  . lactated ringers    . lactated ringers 20 mL/hr at 02/24/17 1800  . lactated ringers Stopped (02/24/17 1700)  . magnesium sulfate 4 g (02/24/17 1406)  . nitroGLYCERIN Stopped (02/24/17 1406)  . phenylephrine (NEO-SYNEPHRINE) Adult infusion 10 mcg/min (02/24/17 1800)  . vancomycin       Lab Results  Component Value Date   WBC 17.1 (H) 02/24/2017   HGB 12.1 02/24/2017   HCT 35.0 (L) 02/24/2017   PLT 246 02/24/2017   GLUCOSE 231 (H) 02/24/2017   CHOL 125 02/17/2017   TRIG  358 (H) 02/17/2017   HDL 30 (L) 02/17/2017   LDLCALC 23 02/17/2017   ALT 20 02/19/2017   AST 19 02/19/2017   NA 140 02/24/2017   K 4.2 02/24/2017   CL 103 02/24/2017   CREATININE 0.70 02/24/2017   BUN 14 02/24/2017   CO2 22 02/22/2017   TSH 0.950 02/17/2017   INR 1.13 02/24/2017   HGBA1C 9.0 (H) 02/19/2017   Still sleepy, but follows commands , not bleeding , CI good  Grace Isaac MD  Beeper [phone] Office [phone] 02/24/2017 6:47 PM

## 2017-02-24 NOTE — Progress Notes (Signed)
NIF -22 VC .85 good cuff leak, Pt extubated to a 4lt Lowgap. SATS 96%

## 2017-02-24 NOTE — Anesthesia Procedure Notes (Signed)
Arterial Line Insertion Start/End11/26/2018 6:45 AM Performed by: Candis Shine, CRNA, CRNA  Patient location: Pre-op. Preanesthetic checklist: patient identified, IV checked, site marked, risks and benefits discussed, surgical consent, monitors and equipment checked, pre-op evaluation, timeout performed and anesthesia consent Lidocaine 1% used for infiltration Left, radial was placed Catheter size: 20 G Hand hygiene performed  and maximum sterile barriers used   Attempts: 1 Procedure performed without using ultrasound guided technique. Following insertion, dressing applied and Biopatch. Post procedure assessment: normal and unchanged  Patient tolerated the procedure well with no immediate complications.

## 2017-02-25 ENCOUNTER — Encounter (HOSPITAL_COMMUNITY): Payer: Self-pay | Admitting: Cardiothoracic Surgery

## 2017-02-25 ENCOUNTER — Inpatient Hospital Stay (HOSPITAL_COMMUNITY): Payer: Medicare HMO

## 2017-02-25 LAB — POCT I-STAT, CHEM 8
BUN: 13 mg/dL (ref 6–20)
CALCIUM ION: 1.24 mmol/L (ref 1.15–1.40)
CREATININE: 0.9 mg/dL (ref 0.44–1.00)
Chloride: 102 mmol/L (ref 101–111)
GLUCOSE: 212 mg/dL — AB (ref 65–99)
HCT: 35 % — ABNORMAL LOW (ref 36.0–46.0)
HEMOGLOBIN: 11.9 g/dL — AB (ref 12.0–15.0)
POTASSIUM: 4.7 mmol/L (ref 3.5–5.1)
Sodium: 135 mmol/L (ref 135–145)
TCO2: 22 mmol/L (ref 22–32)

## 2017-02-25 LAB — GLUCOSE, CAPILLARY
GLUCOSE-CAPILLARY: 115 mg/dL — AB (ref 65–99)
GLUCOSE-CAPILLARY: 129 mg/dL — AB (ref 65–99)
GLUCOSE-CAPILLARY: 130 mg/dL — AB (ref 65–99)
GLUCOSE-CAPILLARY: 135 mg/dL — AB (ref 65–99)
GLUCOSE-CAPILLARY: 164 mg/dL — AB (ref 65–99)
GLUCOSE-CAPILLARY: 168 mg/dL — AB (ref 65–99)
GLUCOSE-CAPILLARY: 183 mg/dL — AB (ref 65–99)
GLUCOSE-CAPILLARY: 199 mg/dL — AB (ref 65–99)
Glucose-Capillary: 169 mg/dL — ABNORMAL HIGH (ref 65–99)
Glucose-Capillary: 142 mg/dL — ABNORMAL HIGH (ref 65–99)
Glucose-Capillary: 115 mg/dL — ABNORMAL HIGH (ref 65–99)
Glucose-Capillary: 150 mg/dL — ABNORMAL HIGH (ref 65–99)
Glucose-Capillary: 159 mg/dL — ABNORMAL HIGH (ref 65–99)
Glucose-Capillary: 143 mg/dL — ABNORMAL HIGH (ref 65–99)
Glucose-Capillary: 131 mg/dL — ABNORMAL HIGH (ref 65–99)
Glucose-Capillary: 137 mg/dL — ABNORMAL HIGH (ref 65–99)
Glucose-Capillary: 129 mg/dL — ABNORMAL HIGH (ref 65–99)

## 2017-02-25 LAB — POCT I-STAT 3, ART BLOOD GAS (G3+)
ACID-BASE DEFICIT: 3 mmol/L — AB (ref 0.0–2.0)
BICARBONATE: 21.3 mmol/L (ref 20.0–28.0)
O2 Saturation: 95 %
TCO2: 22 mmol/L (ref 22–32)
pCO2 arterial: 37.1 mmHg (ref 32.0–48.0)
pH, Arterial: 7.369 (ref 7.350–7.450)
pO2, Arterial: 81 mmHg — ABNORMAL LOW (ref 83.0–108.0)

## 2017-02-25 LAB — CBC
HEMATOCRIT: 36.2 % (ref 36.0–46.0)
HEMATOCRIT: 35.8 % — AB (ref 36.0–46.0)
HEMOGLOBIN: 12.1 g/dL (ref 12.0–15.0)
Hemoglobin: 12.5 g/dL (ref 12.0–15.0)
MCH: 30.4 pg (ref 26.0–34.0)
MCH: 30.6 pg (ref 26.0–34.0)
MCHC: 34.5 g/dL (ref 30.0–36.0)
MCHC: 33.8 g/dL (ref 30.0–36.0)
MCV: 88.1 fL (ref 78.0–100.0)
MCV: 90.4 fL (ref 78.0–100.0)
Platelets: 271 10*3/uL (ref 150–400)
Platelets: 245 10*3/uL (ref 150–400)
RBC: 4.11 MIL/uL (ref 3.87–5.11)
RBC: 3.96 MIL/uL (ref 3.87–5.11)
RDW: 13.1 % (ref 11.5–15.5)
RDW: 13.4 % (ref 11.5–15.5)
WBC: 16.3 10*3/uL — ABNORMAL HIGH (ref 4.0–10.5)
WBC: 18.5 10*3/uL — AB (ref 4.0–10.5)

## 2017-02-25 LAB — MAGNESIUM
MAGNESIUM: 1.7 mg/dL (ref 1.7–2.4)
Magnesium: 2.1 mg/dL (ref 1.7–2.4)

## 2017-02-25 LAB — BASIC METABOLIC PANEL
Anion gap: 4 — ABNORMAL LOW (ref 5–15)
BUN: 9 mg/dL (ref 6–20)
CALCIUM: 8.1 mg/dL — AB (ref 8.9–10.3)
CHLORIDE: 107 mmol/L (ref 101–111)
CO2: 24 mmol/L (ref 22–32)
CREATININE: 0.74 mg/dL (ref 0.44–1.00)
GFR calc Af Amer: >60 mL/min (ref >60)
GFR calc non Af Amer: >60 mL/min (ref >60)
GLUCOSE: 148 mg/dL — AB (ref 65–99)
Potassium: 3.7 mmol/L (ref 3.5–5.1)
Sodium: 135 mmol/L (ref 135–145)

## 2017-02-25 LAB — CREATININE, SERUM: Creatinine, Ser: 0.87 mg/dL (ref 0.44–1.00)

## 2017-02-25 MED ORDER — FUROSEMIDE 10 MG/ML IJ SOLN
20.0000 mg | Freq: Two times a day (BID) | INTRAMUSCULAR | Status: DC
  Administered 2017-02-25 (×2): 20 mg via INTRAVENOUS
  Filled 2017-02-25 (×3): qty 2

## 2017-02-25 MED ORDER — METOCLOPRAMIDE HCL 5 MG/ML IJ SOLN
10.0000 mg | Freq: Four times a day (QID) | INTRAMUSCULAR | Status: DC
Start: 2017-02-25 — End: 2017-02-25

## 2017-02-25 MED ORDER — ENOXAPARIN SODIUM 40 MG/0.4ML ~~LOC~~ SOLN
40.0000 mg | Freq: Every day | SUBCUTANEOUS | Status: DC
  Administered 2017-02-25 – 2017-03-02 (×6): 40 mg via SUBCUTANEOUS
  Filled 2017-02-25 (×6): qty 0.4

## 2017-02-25 MED ORDER — INSULIN DETEMIR 100 UNIT/ML ~~LOC~~ SOLN
14.0000 [IU] | Freq: Every day | SUBCUTANEOUS | Status: DC
  Administered 2017-02-25: 14 [IU] via SUBCUTANEOUS
  Filled 2017-02-25 (×2): qty 0.14

## 2017-02-25 MED ORDER — POTASSIUM CHLORIDE 10 MEQ/50ML IV SOLN
10.0000 meq | INTRAVENOUS | Status: AC
  Administered 2017-02-25 (×3): 10 meq via INTRAVENOUS

## 2017-02-25 MED ORDER — INSULIN ASPART 100 UNIT/ML ~~LOC~~ SOLN
0.0000 [IU] | SUBCUTANEOUS | Status: DC
  Administered 2017-02-25 – 2017-02-26 (×4): 4 [IU] via SUBCUTANEOUS
  Administered 2017-02-26 (×2): 8 [IU] via SUBCUTANEOUS
  Administered 2017-02-26: 4 [IU] via SUBCUTANEOUS
  Administered 2017-02-26: 8 [IU] via SUBCUTANEOUS
  Administered 2017-02-27 (×2): 2 [IU] via SUBCUTANEOUS

## 2017-02-25 MED ORDER — METOCLOPRAMIDE HCL 5 MG/ML IJ SOLN
10.0000 mg | Freq: Four times a day (QID) | INTRAMUSCULAR | Status: DC
  Administered 2017-02-25 – 2017-02-27 (×8): 10 mg via INTRAVENOUS
  Filled 2017-02-25 (×8): qty 2

## 2017-02-25 MED ORDER — POTASSIUM CHLORIDE CRYS ER 20 MEQ PO TBCR
20.0000 meq | EXTENDED_RELEASE_TABLET | Freq: Once | ORAL | Status: AC
  Administered 2017-02-25: 20 meq via ORAL
  Filled 2017-02-25: qty 1

## 2017-02-25 MED FILL — Phenylephrine HCl Inj 10 MG/ML: INTRAMUSCULAR | Qty: 1 | Status: AC

## 2017-02-25 MED FILL — Heparin Sodium (Porcine) Inj 1000 Unit/ML: INTRAMUSCULAR | Qty: 30 | Status: AC

## 2017-02-25 MED FILL — Potassium Chloride Inj 2 mEq/ML: INTRAVENOUS | Qty: 20 | Status: AC

## 2017-02-25 MED FILL — Sodium Chloride IV Soln 0.9%: INTRAVENOUS | Qty: 250 | Status: AC

## 2017-02-25 MED FILL — Thrombin (Recombinant) For Soln 5000 Unit: CUTANEOUS | Qty: 5000 | Status: AC

## 2017-02-25 MED FILL — Magnesium Sulfate Inj 50%: INTRAMUSCULAR | Qty: 10 | Status: AC

## 2017-02-25 NOTE — Op Note (Signed)
NAME:  Theresa Ray, Theresa Ray NO.:  [id number]  MEDICAL RECORD NO.:  [id number]  LOCATION:  [facility]                         FACILITY:  [facility]  PHYSICIAN:  Lanelle Bal, MD    DATE OF BIRTH:  1945-07-31  DATE OF PROCEDURE:  02/24/2017 OPERATIVE REPORT PREOPERATIVE DIAGNOSIS:  Three-vessel coronary artery disease with recent non-ST-elevation myocardial infarction. POSTOPERATIVE DIAGNOSIS:  Three-vessel coronary artery disease with recent non-ST-elevation myocardial infarction. SURGICAL PROCEDURE:  Coronary artery bypass grafting x3 with the left internal mammary to the left anterior descending coronary artery, reversed saphenous vein graft to the first obtuse marginal, reversed saphenous vein graft to the posterior descending with right leg greater saphenous endoscopic vein harvesting from the thigh and calf.  SURGEON:  Lanelle Bal, MD.  FIRST ASSISTANT:  Ellwood Handler, PA.  BRIEF HISTORY:  The patient is a 71 year old diabetic female with a previous history of Lap-Band being done.  She presented with unstable anginal symptoms and positive troponin consistent with non-STEMI myocardial infarction.  She underwent cardiac catheterization, which demonstrated significant 3-vessel coronary artery disease with complex LAD lesion, total occlusion of obtuse marginal and total occlusion of the mid right coronary artery with faint collateral filling from the distal circumflex.  Overall, ventricular function was preserved.  The patient had significant diabetes, risks and options of surgery, and a BMI of 41.  Risks and options of surgery were discussed with the patient in detail, who was agreeable and willing to proceed.  She signed informed consent.  DESCRIPTION OF PROCEDURE:  With Swan-Ganz and arterial line monitors in place, the patient underwent general endotracheal anesthesia without incident.  The skin of the chest and legs was prepped with Betadine and draped in usual  sterile manner.  Preoperatively, General Surgery had been consulted and removed the fluid in her Lap-Band in anticipation of TEE.  Dr. Glennon Mac placed a TEE down to the distal esophagus with good images of the heart, however, because of slight resistance, sub-gastric views were not obtained.  We then proceeded to prep the chest and abdomen and legs with Betadine and draped in usual sterile manner. Appropriate time-out was performed and then we proceeded with endoscopic vein harvesting of the right greater saphenous vein from the thigh and calf.  The vein was of adequate quality and caliber, if not very slightly enlarged.  Median sternotomy was performed.  Left internal mammary artery was dissected down as a pedicle graft.  The distal artery was divided and had good free flow.  The pericardium was opened. Overall ventricular function appeared intact.  This was confirmed with TEE.  The patient was systemically heparinized.  Ascending aorta was cannulated.  The right atrium was cannulated.  An aortic root vent cardioplegia needle was introduced into the ascending aorta.  The patient was placed on cardiopulmonary bypass 2.4 L/min/m2.  Sites of anastomosis were selected and dissected out of the epicardium.  The patient's body temperature was cooled to 32 degrees.  Aortic crossclamp was applied and 500 mL of cold blood potassium cardioplegia was administered.  Sites of anastomosis had been dissected out.  Previously, we elevated the acute margin of the heart and identified the posterior descending coronary artery.  This vessel was opened.  It was 1.3-1.4 mm in size.  Using a running 7-0 Prolene, distal anastomosis was performed with  a segment of reversed saphenous vein graft.  The heart was then elevated.  The two very small diagonal branches were identified, but both were too small to bypass.  The ramus was intramyocardial without significant disease.  The first obtuse marginal was  also intramyocardial.  This vessel was totally occluded.  As we dissected out of its intramyocardial position, the vessel was adequate for bypass. The vessel was opened and admitted a 1-mm probe distally.  The vessel was diffusely thickened, but it was approximately 1.3-1.4 mm in size. Using a running 7-0 Prolene, distal anastomosis was performed. Additional cold blood cardioplegia was administered down the vein grafts.  We then turned our attention to the left anterior descending coronary artery.  This LAD was opened between the mid and distal third of the LAD.  A 1.5-mm probe passed proximally and distally.  Using a running 8-0 Prolene, left internal mammary artery was anastomosed to left anterior descending coronary artery.  With crossclamp still in place, 2 punch aortotomies were performed and each of the 2 vein grafts were anastomosed to the ascending aorta.  The bulldog was removed from the mammary artery with rise in myocardial septal temperature.  The heart was allowed to passively fill and de-air and we completed the proximal anastomoses and the crossclamp was removed with a total crossclamp time of 71 minutes.  The patient spontaneously converted to a sinus rhythm.  She was atrially paced, increased the rate.  Sites of anastomosis were inspected, were free of bleeding.  With the patient's body temperature rewarmed to 37 degrees, she was then ventilated and weaned from cardiopulmonary bypass without difficulty.  She remained hemodynamically stable.  She did require 100% oxygen ventilation and recruitment, but this improved her O2 saturations into the high 90s. She was decannulated in usual fashion.  Protamine sulfate was administered.  Atrial and ventricular pacing wires had been applied.  A left pleural tube and a Blake mediastinal drain were left in place. Pericardium was loosely reapproximated.  Sternum was closed with #6 stainless steel wire.  Fascia was closed with  interrupted 0 Vicryl, running 3-0 Vicryl in subcutaneous tissue, 3-0 subcuticular stitch in the skin edges.  Dry dressings were applied.  Sponge and needle count was reported correct at the completion of procedure.  The patient tolerated the procedure without obvious complication.  She did not require any blood bank blood products during the operative procedure. Total pump time was 94 minutes.     Lanelle Bal, MD     EG/MEDQ  D:  02/25/2017  T:  02/25/2017  Job:  [id number]

## 2017-02-25 NOTE — Progress Notes (Addendum)
[facility] DAILY ICU PROGRESS NOTE                   [address]            [place] [address]          [phone]   1 Day Post-Op Procedure(s) (LRB): CORONARY ARTERY BYPASS GRAFTING (CABG) USING LEFT INTERNAL MAMMARY ARTERY TO LAD AND ENDOSCOPICALLY HARVESTED GREATER SAPHENOUS VEIN TO PDA AND TO OM1. (N/A) TRANSESOPHAGEAL ECHOCARDIOGRAM (TEE) (N/A) ENDOVEIN HARVEST OF GREATER SAPHENOUS VEIN (Right)  Total Length of Stay:  LOS: 9 days   Subjective: Patient has some sternal incisional pain. She states she did have nausea and vomited once earlier this am.  Objective: Vital signs in last 24 hours: Temp:  [97.7 F (36.5 C)-100 F (37.8 C)] 98.8 F (37.1 C) (11/27 0700) Pulse Rate:  [56-99] 75 (11/27 0700) Cardiac Rhythm: Normal sinus rhythm (11/27 0700) Resp:  [10-28] 10 (11/27 0700) BP: (89-132)/(64-85) 109/75 (11/27 0700) SpO2:  [90 %-100 %] 94 % (11/27 0700) Arterial Line BP: (77-147)/(52-78) 79/59 (11/27 0700) FiO2 (%):  [40 %-100 %] 40 % (11/26 2101) Weight:  [218 lb 11.1 oz (99.2 kg)] 218 lb 11.1 oz (99.2 kg) (11/27 0430)  Filed Weights   02/23/17 0400 02/24/17 0500 02/25/17 0430  Weight: 207 lb 14.4 oz (94.3 kg) 209 lb (94.8 kg) 218 lb 11.1 oz (99.2 kg)    Weight change: 9 lb 11.1 oz (4.398 kg)   Hemodynamic parameters for last 24 hours: PAP: (26-35)/(11-22) 32/13 CO:  [3.3 L/min-4.5 L/min] 4.5 L/min CI:  [1.8 L/min/m2-2.4 L/min/m2] 2.4 L/min/m2  Intake/Output from previous day: 11/26 0701 - 11/27 0700 In: 4177 [I.V.:3102; Blood:575; IV Piggyback:500] Out: 3580 [Urine:2220; Blood:1110; Chest Tube:250]  Current Meds: Scheduled Meds: . acetaminophen  1,000 mg Oral Q6H   Or  . acetaminophen (TYLENOL) oral liquid 160 mg/5 mL  1,000 mg Per Tube Q6H  . aspirin EC  325 mg Oral Daily   Or  . aspirin  324 mg Per Tube Daily  . atorvastatin  80 mg Oral q1800  . bisacodyl  10 mg Oral Daily   Or  . bisacodyl  10 mg Rectal Daily  . chlorhexidine  gluconate (MEDLINE KIT)  15 mL Mouth Rinse BID  . docusate sodium  200 mg Oral Daily  . gabapentin  100 mg Oral TID  . insulin regular  0-10 Units Intravenous TID WC  . levalbuterol  0.63 mg Nebulization Q6H  . levothyroxine  150 mcg Oral QAC breakfast  . mouth rinse  15 mL Mouth Rinse BID  . metoCLOPramide (REGLAN) injection  10 mg Intravenous Q6H  . metoprolol tartrate  12.5 mg Oral BID   Or  . metoprolol tartrate  12.5 mg Per Tube BID  . [START ON 02/26/2017] pantoprazole  40 mg Oral Daily  . sodium chloride flush  3 mL Intravenous Q12H   Continuous Infusions: . sodium chloride 20 mL/hr at 02/25/17 0700  . sodium chloride    . sodium chloride 20 mL/hr at 02/25/17 0500  . albumin human    . cefUROXime (ZINACEF)  IV Stopped (02/24/17 2007)  . dexmedetomidine (PRECEDEX) IV infusion Stopped (02/24/17 2010)  . DOPamine 2 mcg/kg/min (02/25/17 6767)  . insulin (NOVOLIN-R) infusion 4.2 Units/hr (02/25/17 0618)  . lactated ringers    . lactated ringers 20 mL/hr at 02/25/17 0700  . lactated ringers Stopped (02/24/17 1700)  . nitroGLYCERIN Stopped (02/24/17 1406)  . phenylephrine (NEO-SYNEPHRINE) Adult infusion Stopped (  02/24/17 2231)  . potassium chloride 10 mEq (02/25/17 0720)   PRN Meds:.sodium chloride, albumin human, lactated ringers, Melatonin, metoprolol tartrate, midazolam, morphine injection, ondansetron (ZOFRAN) IV, oxyCODONE, sodium chloride flush, traMADol  General appearance: cooperative and no distress Neurologic: intact Heart: RRR Lungs: Slightly decreased at bases Abdomen: Soft, obese, non tender, occasional bowel sounds present Extremities: SCD on the LLE and right lower extremity with edema Wound: Aquacel intact;right lower leg dressings dry and intact  Lab Results: CBC: Recent Labs    02/24/17 1938 02/24/17 1949 02/25/17 0353  WBC 17.2*  --  16.3*  HGB 13.1 12.6 12.5  HCT 37.5 37.0 36.2  PLT 318  --  271   BMET:  Recent Labs    02/24/17 1949  02/25/17 0353  NA 138 135  K 3.9 3.7  CL 104 107  CO2  --  24  GLUCOSE 130* 148*  BUN 9 9  CREATININE 0.60 0.74  CALCIUM  --  8.1*    CMET: Lab Results  Component Value Date   WBC 16.3 (H) 02/25/2017   HGB 12.5 02/25/2017   HCT 36.2 02/25/2017   PLT 271 02/25/2017   GLUCOSE 148 (H) 02/25/2017   CHOL 125 02/17/2017   TRIG 358 (H) 02/17/2017   HDL 30 (L) 02/17/2017   LDLCALC 23 02/17/2017   ALT 20 02/19/2017   AST 19 02/19/2017   NA 135 02/25/2017   K 3.7 02/25/2017   CL 107 02/25/2017   CREATININE 0.74 02/25/2017   BUN 9 02/25/2017   CO2 24 02/25/2017   TSH 0.950 02/17/2017   INR 1.13 02/24/2017   HGBA1C 9.0 (H) 02/19/2017      PT/INR:  Recent Labs    02/24/17 1403  LABPROT 14.4  INR 1.13   Radiology: Dg Chest Port 1 View  Result Date: 02/24/2017 CLINICAL DATA:  CABG. EXAM: PORTABLE CHEST 1 VIEW COMPARISON:  Chest x-ray dated February 16, 2017. FINDINGS: Postsurgical changes related to interval CABG. Intact median sternotomy wires. The endotracheal tube is in place with the tip approximately 2.7 cm above the level of the carina. Mediastinal and left-sided chest tubes are in place. Right internal jugular Swan-Ganz catheter with the tip projecting over the main pulmonary outflow tract. Stable cardiomediastinal silhouette. Normal pulmonary vascularity. Low lung volumes with bibasilar atelectasis. Trace pleural fluid along the right minor fissure. No pneumothorax. IMPRESSION: 1. Postsurgical changes related to interval CABG with appropriately positioned support tubes and apparatus. 2. Low lung volumes.  No pneumothorax. Electronically Signed   By: Titus Dubin M.D.   On: 02/24/2017 14:09     Assessment/Plan: S/P Procedure(s) (LRB): CORONARY ARTERY BYPASS GRAFTING (CABG) USING LEFT INTERNAL MAMMARY ARTERY TO LAD AND ENDOSCOPICALLY HARVESTED GREATER SAPHENOUS VEIN TO PDA AND TO OM1. (N/A) TRANSESOPHAGEAL ECHOCARDIOGRAM (TEE) (N/A) ENDOVEIN HARVEST OF GREATER  SAPHENOUS VEIN (Right)  1. CV-On backup pacer at 70 but intrinsic HR overriding this am. Brief pause after arrival to ICU yesterday but none since. CO/CI 4.5/2.4. On Dopamine drip and Lopressor 12.5 mg bid. Wean Dopamine as able. 2. Pulmonary-On 4 liters of oxygen via [place]. Will wean over the next few days. Chest tube output 250 cc since surgery. CXR this am appears to show low lung volumes, no pneumothorax, small pleural effusions. May be able to remove 1 or 2 chest tube later today if output remains low.Encourage incentive spirometer. 3. Volume overload-Will give IV Lasix 20 mg bid today 4. ABL anemia- H and H 12.5 and 36.2 5. DM-CBGs 129/143/130. On Insulin  drip. Will not schedule Insulin at this time as not taking much po. Pre op HGA1C 9. Will restart Metformin and Glipizide once tolerating po better.  6. Supplement potassium-give via IV runs 7. GI-Reglan and slowly advance diet as tolerates. 8. Please see progression orders  Donielle Liston Alba PA-C 02/25/2017 7:21 AM   Expected Acute  Blood - loss Anemia- continue to monitor  Stable this am Mild diuresis  Ambulate  I have seen and examined Leida Lauth and agree with the above assessment  and plan.  Grace Isaac MD [facility]per [phone] Office [phone] 02/25/2017 11:05 AM

## 2017-02-25 NOTE — Care Management Note (Signed)
Case Management Note Marvetta Gibbons RN, BSN Unit 4E-Case Manager- [place] coverage [phone]  Patient Details  Name: Theresa Ray MRN: [id number] Date of Birth: 03/23/1946  Subjective/Objective:  Pt admitted with chest pain, cardiac cath showed MVD- s/p CABG on 11/26                  Action/Plan: PTA pt lived at home, - CM to follow for d/c needs   Expected Discharge Date:                  Expected Discharge Plan:  Home/Self Care  In-House Referral:     Discharge planning Services  CM Consult  Post Acute Care Choice:    Choice offered to:     DME Arranged:    DME Agency:     HH Arranged:    HH Agency:     Status of Service:  In process, will continue to follow  If discussed at Long Length of Stay Meetings, dates discussed:    Discharge Disposition:   Additional Comments:  Dawayne Patricia, RN 02/25/2017, 11:12 AM

## 2017-02-25 NOTE — Progress Notes (Signed)
[facility] BRIEF SICU PROGRESS NOTE  1 Day Post-Op  S/P Procedure(s) (LRB): CORONARY ARTERY BYPASS GRAFTING (CABG) USING LEFT INTERNAL MAMMARY ARTERY TO LAD AND ENDOSCOPICALLY HARVESTED GREATER SAPHENOUS VEIN TO PDA AND TO OM1. (N/A) TRANSESOPHAGEAL ECHOCARDIOGRAM (TEE) (N/A) ENDOVEIN HARVEST OF GREATER SAPHENOUS VEIN (Right)   Stable day NSR w/ stable BP off drips Breathing comfortably w/ O2 sats 96% on 4 L/min Diuresing fairly well Labs okay  Plan: Continue current plan  Rexene Alberts, MD 02/25/2017 5:18 PM

## 2017-02-25 NOTE — Progress Notes (Signed)
EKG CRITICAL VALUE     12 lead EKG performed.  Critical value noted Janiice RN notified.   Delfin Edis, [place] 02/25/2017 7:45 AM

## 2017-02-26 ENCOUNTER — Inpatient Hospital Stay (HOSPITAL_COMMUNITY): Payer: Medicare HMO

## 2017-02-26 DIAGNOSIS — L8990 Pressure ulcer of unspecified site, unspecified stage: Secondary | ICD-10-CM

## 2017-02-26 LAB — BASIC METABOLIC PANEL
Anion gap: 8 (ref 5–15)
BUN: 11 mg/dL (ref 6–20)
CHLORIDE: 104 mmol/L (ref 101–111)
CO2: 24 mmol/L (ref 22–32)
CREATININE: 0.96 mg/dL (ref 0.44–1.00)
Calcium: 8.7 mg/dL — ABNORMAL LOW (ref 8.9–10.3)
GFR calc Af Amer: >60 mL/min (ref >60)
GFR calc non Af Amer: 59 mL/min — ABNORMAL LOW (ref >60)
GLUCOSE: 175 mg/dL — AB (ref 65–99)
POTASSIUM: 4.2 mmol/L (ref 3.5–5.1)
SODIUM: 136 mmol/L (ref 135–145)

## 2017-02-26 LAB — CBC
HEMATOCRIT: 33.7 % — AB (ref 36.0–46.0)
Hemoglobin: 11.1 g/dL — ABNORMAL LOW (ref 12.0–15.0)
MCH: 29.9 pg (ref 26.0–34.0)
MCHC: 32.9 g/dL (ref 30.0–36.0)
MCV: 90.8 fL (ref 78.0–100.0)
PLATELETS: 238 10*3/uL (ref 150–400)
RBC: 3.71 MIL/uL — ABNORMAL LOW (ref 3.87–5.11)
RDW: 13.5 % (ref 11.5–15.5)
WBC: 15.6 10*3/uL — ABNORMAL HIGH (ref 4.0–10.5)

## 2017-02-26 LAB — GLUCOSE, CAPILLARY
GLUCOSE-CAPILLARY: 214 mg/dL — AB (ref 65–99)
GLUCOSE-CAPILLARY: 207 mg/dL — AB (ref 65–99)
Glucose-Capillary: 140 mg/dL — ABNORMAL HIGH (ref 65–99)
Glucose-Capillary: 172 mg/dL — ABNORMAL HIGH (ref 65–99)
Glucose-Capillary: 184 mg/dL — ABNORMAL HIGH (ref 65–99)
Glucose-Capillary: 190 mg/dL — ABNORMAL HIGH (ref 65–99)
Glucose-Capillary: 209 mg/dL — ABNORMAL HIGH (ref 65–99)

## 2017-02-26 MED ORDER — POTASSIUM CHLORIDE CRYS ER 20 MEQ PO TBCR
20.0000 meq | EXTENDED_RELEASE_TABLET | Freq: Once | ORAL | Status: AC
  Administered 2017-02-26: 20 meq via ORAL
  Filled 2017-02-26: qty 1

## 2017-02-26 MED ORDER — METOPROLOL TARTRATE 25 MG/10 ML ORAL SUSPENSION: 25.0000 mg | Freq: Two times a day (BID) | ORAL | Status: DC

## 2017-02-26 MED ORDER — INSULIN DETEMIR 100 UNIT/ML ~~LOC~~ SOLN
18.0000 [IU] | Freq: Every day | SUBCUTANEOUS | Status: DC
  Administered 2017-02-26 – 2017-02-27 (×2): 18 [IU] via SUBCUTANEOUS
  Filled 2017-02-26 (×3): qty 0.18

## 2017-02-26 MED ORDER — METOPROLOL TARTRATE 25 MG PO TABS
25.0000 mg | ORAL_TABLET | Freq: Two times a day (BID) | ORAL | Status: DC
  Administered 2017-02-26 – 2017-02-27 (×4): 25 mg via ORAL
  Filled 2017-02-26 (×4): qty 1

## 2017-02-26 MED ORDER — FUROSEMIDE 10 MG/ML IJ SOLN
40.0000 mg | Freq: Once | INTRAMUSCULAR | Status: AC
Start: 2017-02-26 — End: 2017-02-26
  Administered 2017-02-26: 40 mg via INTRAVENOUS
  Filled 2017-02-26: qty 4

## 2017-02-26 NOTE — Discharge Instructions (Signed)
Coronary Artery Bypass Grafting, Care After This sheet gives you information about how to care for yourself after your procedure. Your health care provider may also give you more specific instructions. If you have problems or questions, contact your health care provider. What can I expect after the procedure? After the procedure, it is common to have:  Nausea and a lack of appetite.  Constipation.  Weakness and fatigue.  Depression or irritability.  Pain or discomfort in your incision areas.  Follow these instructions at home: Medicines  Take over-the-counter and prescription medicines only as told by your health care provider. Do not stop taking medicines or start any new medicines without approval from your health care provider.  If you were prescribed an antibiotic medicine, take it as told by your health care provider. Do not stop taking the antibiotic even if you start to feel better.  Do not drive or use heavy machinery while taking prescription pain medicine. Incision care  Follow instructions from your health care provider about how to take care of your incisions. Make sure you: ? Wash your hands with soap and water before you change your bandage (dressing). If soap and water are not available, use hand sanitizer. ? Change your dressing as told by your health care provider. ? Leave stitches (sutures), skin glue, or adhesive strips in place. These skin closures may need to stay in place for 2 weeks or longer. If adhesive strip edges start to loosen and curl up, you may trim the loose edges. Do not remove adhesive strips completely unless your health care provider tells you to do that.  Keep incision areas clean, dry, and protected.  Check your incision areas every day for signs of infection. Check for: ? More redness, swelling, or pain. ? More fluid or blood. ? Warmth. ? Pus or a bad smell.  If incisions were made in your legs: ? Avoid crossing your legs. ? Avoid  sitting for long periods of time. Change positions every 30 minutes. ? Raise (elevate) your legs when you are sitting. Bathing  Do not take baths, swim, or use a hot tub until your health care provider approves.  Only take sponge baths. Pat the incisions dry. Do not rub incisions with a washcloth or towel.  Ask your health care provider when you can shower. Eating and drinking  Eat foods that are high in fiber, such as raw fruits and vegetables, whole grains, beans, and nuts. Meats should be lean cut. Avoid canned, processed, and fried foods. This can help prevent constipation and is a recommended part of a heart-healthy diet.  Drink enough fluid to keep your urine clear or pale yellow.  Limit alcohol intake to no more than 1 drink a day for nonpregnant women and 2 drinks a day for men. One drink equals 12 oz of beer, 5 oz of wine, or 1 oz of hard liquor. Activity  Rest and limit your activity as told by your health care provider. You may be instructed to: ? Stop any activity right away if you have chest pain, shortness of breath, irregular heartbeats, or dizziness. Get help right away if you have any of these symptoms. ? Move around frequently for short periods or take short walks as directed by your health care provider. Gradually increase your activities. You may need physical therapy or cardiac rehabilitation to help strengthen your muscles and build your endurance. ? Avoid lifting, pushing, or pulling anything that is heavier than 10 lb (4.5 kg) for at  least 6 weeks or as told by your health care provider. °· Do not drive until your health care provider approves. °· Ask your health care provider when you may return to work. °· Ask your health care provider when you may resume sexual activity. °General instructions °· Do not use any products that contain nicotine or tobacco, such as cigarettes and e-cigarettes. If you need help quitting, ask your health care provider. °· Take 2-3 deep  breaths every few hours during the day, while you recover. This helps expand your lungs and prevent complications like pneumonia after surgery. °· If you were given a device called an incentive spirometer, use it several times a day to practice deep breathing. Support your chest with a pillow or your arms when you take deep breaths or cough. °· Wear compression stockings as told by your health care provider. These stockings help to prevent blood clots and reduce swelling in your legs. °· Weigh yourself every day. This helps identify if your body is holding (retaining) fluid that may make your heart and lungs work harder. °· Keep all follow-up visits as told by your health care provider. This is important. °Contact a health care provider if: °· You have more redness, swelling, or pain around any incision. °· You have more fluid or blood coming from any incision. °· Any incision feels warm to the touch. °· You have pus or a bad smell coming from any incision °· You have a fever. °· You have swelling in your ankles or legs. °· You have pain in your legs. °· You gain 2 lb (0.9 kg) or more a day. °· You are nauseous or you vomit. °· You have diarrhea. °Get help right away if: °· You have chest pain that spreads to your jaw or arms. °· You are short of breath. °· You have a fast or irregular heartbeat. °· You notice a "clicking" in your breastbone (sternum) when you move. °· You have numbness or weakness in your arms or legs. °· You feel dizzy or light-headed. °Summary °· After the procedure, it is common to have pain or discomfort in the incision areas. °· Do not take baths, swim, or use a hot tub until your health care provider approves. °· Gradually increase your activities. You may need physical therapy or cardiac rehabilitation to help strengthen your muscles and build your endurance. °· Weigh yourself every day. This helps identify if your body is holding (retaining) fluid that may make your heart and lungs work  harder. °This information is not intended to replace advice given to you by your health care provider. Make sure you discuss any questions you have with your health care provider. °Document Released: 10/05/2004 Document Revised: 02/05/2016 Document Reviewed: 02/05/2016 °Elsevier Interactive Patient Education © 2018 [employer] ° ° °Endoscopic Saphenous Vein Harvesting, Care After °Refer to this sheet in the next few weeks. These instructions provide you with information about caring for yourself after your procedure. Your health care provider may also give you more specific instructions. Your treatment has been planned according to current medical practices, but problems sometimes occur. Call your health care provider if you have any problems or questions after your procedure. °What can I expect after the procedure? °After the procedure, it is common to have: °· Pain. °· Bruising. °· Swelling. °· Numbness. ° °Follow these instructions at home: °Medicine °· Take over-the-counter and prescription medicines only as told by your health care provider. °· Do not drive or operate heavy machinery   while taking prescription pain medicine. °Incision care ° °· Follow instructions from your health care provider about how to take care of the cut made during surgery (incision). Make sure you: °? Wash your hands with soap and water before you change your bandage (dressing). If soap and water are not available, use hand sanitizer. °? Change your dressing as told by your health care provider. °? Leave stitches (sutures), skin glue, or adhesive strips in place. These skin closures may need to be in place for 2 weeks or longer. If adhesive strip edges start to loosen and curl up, you may trim the loose edges. Do not remove adhesive strips completely unless your health care provider tells you to do that. °· Check your incision area every day for signs of infection. Check for: °? More redness, swelling, or pain. °? More fluid or  blood. °? Warmth. °? Pus or a bad smell. °General instructions °· Raise (elevate) your legs above the level of your heart while you are sitting or lying down. °· Do any exercises your health care providers have given you. These may include deep breathing, coughing, and walking exercises. °· Do not shower, take baths, swim, or use a hot tub unless told by your health care provider. °· Wear your elastic stocking if told by your health care provider. °· Keep all follow-up visits as told by your health care provider. This is important. °Contact a health care provider if: °· Medicine does not help your pain. °· Your pain gets worse. °· You have new leg bruises or your leg bruises get bigger. °· You have a fever. °· Your leg feels numb. °· You have more redness, swelling, or pain around your incision. °· You have more fluid or blood coming from your incision. °· Your incision feels warm to the touch. °· You have pus or a bad smell coming from your incision. °Get help right away if: °· Your pain is severe. °· You develop pain, tenderness, warmth, redness, or swelling in any part of your leg. °· You have chest pain. °· You have trouble breathing. °This information is not intended to replace advice given to you by your health care provider. Make sure you discuss any questions you have with your health care provider. °Document Released: 11/28/2010 Document Revised: 08/24/2015 Document Reviewed: 01/30/2015 °Elsevier Interactive Patient Education © 2018 [employer] ° ° °

## 2017-02-26 NOTE — Plan of Care (Signed)
Patient progressing as expected.

## 2017-02-26 NOTE — Discharge Summary (Signed)
Physician Discharge Summary  Patient ID: Theresa Ray MRN: [id number] DOB/AGE: 11-05-1945 71 y.o.  Admit date: 02/16/2017 Discharge date: 03/03/2017  Admission Diagnoses: 1. NSTEMI (non-ST elevated myocardial infarction) ([place]) 2. Coronary artery disease  Active Diagnoses:  1. 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 Hyperlipidemia 4. Tobacco abuse 5. Hypothyroidism 6. Osteoarthritis 7. ABL anemia  Discharged Condition: Stable and discharged to home.  History of Present Illness:  Theresa Ray is a 71 yo obese white female with history Hypertension, TIA, OSA, DM, Hypothyroidism, Hyperlipidemia, and previous tobacco abuse.  She presented to an OSH emergency department on 02/15/2017 with chest pain.  The pain awoke her from sleep but resolved spontaneously.  It occurred 2 more times throughout the day prompting her to come to the ED.  There was associated arm numbness bilaterally, but no associated nausea, vomiting, or diaphoresis.  She admitted that there were times  Workup showed elevated troponin level and she was ruled in for a NSTEMI.  She was transferred to [facility] for further care.  She was taken for cardiac catheterization on 02/18/2017 where she was found to have multivessel CAD.  It was felt coronary bypass grafting would be indicated and TCTS consult was obtained.  She was placed on heparin and was chest pain free post procedure.  The patient was evaluated by Dr.Gerhardt.  He was in agreement the patient would require coronary bypass grafting.  The risks and benefits of the procedure were explained to the patient and she was agreeable to proceed.  Hospital Course:   She remained chest pain free during her hospitalization.  She was taken to the operating room on 02/24/2017.  She underwent CABG x 3 utilizing LIMA to LAD, SVG to OM 1, and SVG to PDA.  She also underwent endoscopic harvest  of greater saphenous vein from her right leg.  She tolerated the procedure without difficulty and was taken to the SICU in stable condition.  The patient was extubated the evening of surgery.  During her stay in the SICU the patient was weaned off Dopamine as tolerated.  Her chest tubes and arterial lines were removed without difficulty.  She was started on aggressive IV diuretics for volume overload.  She required potassium supplementation.  She was tachycardic and her lopressor was titrated as tolerated.  She had ABL anemia. Her last H and H was 12.7/38.1 . She was weaned off the Insulin drip. She was restarted on Metformin and Glipizide as taken pre op. Her glucose remained fairly well controlled. Her pre op HGA1C  was 9. She will require close medical follow up after discharge. She was felt surgically stable for transfer from the ICU to 4E on 02/27/2017. She has been weaned to room air. She did develop lower sternal drainage (no erythema). This is likely fat necrosis. A [place] wound VAC was placed on 03/01/2017. There were no signs of infection so she was not placed on an antibiotic. She has been tolerating a diet and has had a bowel movement. Epicardial pacing wires were removed 03/01/2017. Chest tube sutures will be removed the day of discharge. She is felt surgically stable for discharge today.  Significant Diagnostic Studies: angiography:    Prox LAD lesion is 70% stenosed -at Diag 1. Dist LAD lesion is 99% stenosed.  Ost 1st Diag lesion is 85% stenosed. Very small caliber vessel  Ramus Distal lesion is 100% stenosed.  Prox RCA lesion is 75%  stenosed. Mid RCA to Dist RCA lesion is 100% stenosed.--The R PDA and PL systems fill via left-to-right collaterals with brisk flow.  Prox Cx lesion is 50% stenosed -proximal to OM1  Ost 1st Mrg lesion is 100% stenosed. Ostial occlusion. The vessel fills retrograde via left to left collaterals  Dist Cx lesion is 75% stenosed -in the band prior to small  left posterolateral branch  There is mild left ventricular systolic dysfunction. The left ventricular ejection fraction is 50-55% by visual estimate.  LV end diastolic pressure is moderately elevated.   Patient has multivessel severe disease with total occlusion of the RCA is likely culprit.  It appears that the OM1 occlusion may very well be chronic.  There is also distal occlusion of 1 of the branches of the ramus intermedius and the LAD at the apex.  CLINICAL DATA:  Postop checking  EXAM: CHEST  2 VIEW  COMPARISON:  02/27/2017  FINDINGS: Mild bilateral interstitial thickening. Mild right midlung atelectasis. Trace bilateral pleural effusion. No pneumothorax. No focal consolidation. Stable cardiomediastinal silhouette. Prior CABG. No acute osseous abnormality.  IMPRESSION: 1. Cardiomegaly with mild pulmonary vascular congestion.   Electronically Signed   By: Kathreen Devoid   On: 02/28/2017 10:02  Treatments: surgery:    Coronary artery bypass grafting x3 with the left internal mammary to the left anterior descending coronary artery, reversed saphenous vein graft to the first obtuse marginal, reversed saphenous vein graft to the posterior descending with right leg greater saphenous endoscopic vein harvesting from the thigh and calf by Dr. Servando Snare on 02/24/2017.  Disposition:    Discharge medications:  The patient has been discharged on:   1.Beta Blocker:  Yes [ x  ]                              No   [   ]                              If No, reason:  2.Ace Inhibitor/ARB: Yes [ x  ]                                     No  [    ]                                     If No, reason:  3.Statin:   Yes [ x  ]                  No  [   ]                  If No, reason:  4.Ecasa:  Yes  [ x  ]                  No   [   ]                  If No, reason:     Discharge Instructions    Amb Referral to Cardiac Rehabilitation   Complete by:  As directed    Diagnosis:   CABG   CABG X ___:  3     Allergies as of 03/03/2017  Reactions   Shellfish Allergy Other (See Comments)   UNKNOWN REACTION.  Allergy found on ALLERGY TEST.   Aggrenox [aspirin-dipyridamole Er] Other (See Comments)   Redness in eyes, takes aspirin 325 at home at baseline      Medication List    STOP taking these medications   cloNIDine 0.2 MG tablet Commonly known as:  CATAPRES   methylPREDNISolone 4 MG Tbpk tablet Commonly known as:  MEDROL DOSEPAK     TAKE these medications   acetaminophen 325 MG tablet Commonly known as:  TYLENOL Take 2 tablets (650 mg total) by mouth every 6 (six) hours as needed for mild pain.   amiodarone 200 MG tablet Commonly known as:  PACERONE Take 1 tablet (200 mg total) by mouth every 12 (twelve) hours. Then on 12/8 decrease to 200 mg daily   amLODipine 10 MG tablet Commonly known as:  NORVASC Take 10 mg daily by mouth.   aspirin EC 325 MG tablet Take 325 mg daily by mouth.   atorvastatin 80 MG tablet Commonly known as:  LIPITOR Take 1 tablet (80 mg total) by mouth daily at 6 PM. What changed:    medication strength  how much to take  when to take this   carvedilol 25 MG tablet Commonly known as:  COREG Take 25 mg 2 (two) times daily by mouth.   celecoxib 100 MG capsule Commonly known as:  CELEBREX Take 100 mg daily by mouth.   cholecalciferol 1000 units tablet Commonly known as:  VITAMIN D Take 3,000 Units 2 (two) times daily by mouth.   co-enzyme Q-10 30 MG capsule Take 30 mg daily by mouth.   EQL POTASSIUM GLUCONATE 595 (99 K) MG Tabs tablet Generic drug:  potassium gluconate Take 1,785 mg 2 (two) times daily by mouth.   furosemide 40 MG tablet Commonly known as:  LASIX Take 40 mg daily by mouth.   gabapentin 100 MG capsule Commonly known as:  NEURONTIN Take 100 mg 3 (three) times daily by mouth.   glipiZIDE 10 MG tablet Commonly known as:  GLUCOTROL Take 10 mg 2 (two) times daily by mouth.    GLUCOSAMINE CHONDR 500 COMPLEX PO Take 1 tablet 2 (two) times daily by mouth.   Krill Oil 1000 MG Caps Take 1,000 mg daily by mouth.   levothyroxine 175 MCG tablet Commonly known as:  SYNTHROID, LEVOTHROID Take 175 mcg daily before breakfast by mouth.   lisinopril 5 MG tablet Commonly known as:  PRINIVIL,ZESTRIL Take 1 tablet (5 mg total) by mouth daily.   MELATONIN PO Take 10 mg at bedtime by mouth.   metFORMIN 1000 MG tablet Commonly known as:  GLUCOPHAGE Take 1,000 mg 2 (two) times daily by mouth.   multivitamin with minerals Tabs tablet Take 1 tablet daily by mouth.   sitaGLIPtin 50 MG tablet Commonly known as:  JANUVIA Take 50 mg daily by mouth.   traMADol 50 MG tablet Commonly known as:  ULTRAM Take 1-2 tablets (50-100 mg total) by mouth every 4 (four) hours as needed for moderate pain.      Follow-up Information    Grace Isaac, MD Follow up on 04/03/2017.   Specialty:  Cardiothoracic Surgery Why:  Appointment is at 4:00, please get CXR at 3:30 at [facility] located on first floor of our office building Contact information: [place] [address] [phone]        Imogene Burn, [place]. Go on 03/18/2017.   Specialty:  Cardiology Why:  Appointment time is at 8:30 am Contact information: [address] [place] [address] [phone]           Signed: Ellwood Handler PA-C 03/03/2017, 8:20 AM

## 2017-02-26 NOTE — Progress Notes (Addendum)
TCTS DAILY ICU PROGRESS NOTE                   [address]            [place] [address]          [phone]   2 Days Post-Op Procedure(s) (LRB): CORONARY ARTERY BYPASS GRAFTING (CABG) USING LEFT INTERNAL MAMMARY ARTERY TO LAD AND ENDOSCOPICALLY HARVESTED GREATER SAPHENOUS VEIN TO PDA AND TO OM1. (N/A) TRANSESOPHAGEAL ECHOCARDIOGRAM (TEE) (N/A) ENDOVEIN HARVEST OF GREATER SAPHENOUS VEIN (Right)  Total Length of Stay:  LOS: 10 days   Subjective: Patient sitting in chair. She states her back hurts her this am.  Objective: Vital signs in last 24 hours: Temp:  [98.1 F (36.7 C)-98.9 F (37.2 C)] 98.4 F (36.9 C) (11/28 0500) Pulse Rate:  [70-109] 109 (11/28 0800) Cardiac Rhythm: Sinus tachycardia (11/28 0800) Resp:  [9-19] 17 (11/28 0800) BP: (91-159)/(65-81) 130/81 (11/28 0800) SpO2:  [87 %-98 %] 92 % (11/28 0800) Weight:  [217 lb 6 oz (98.6 kg)] 217 lb 6 oz (98.6 kg) (11/28 0500)  Filed Weights   02/24/17 0500 02/25/17 0430 02/26/17 0500  Weight: 209 lb (94.8 kg) 218 lb 11.1 oz (99.2 kg) 217 lb 6 oz (98.6 kg)    Weight change: -5.2 oz (-0.6 kg)   Hemodynamic parameters for last 24 hours: PAP: (36)/(18-19) 36/18  Intake/Output from previous day: 11/27 0701 - 11/28 0700 In: 820.5 [P.O.:360; I.V.:410.5; IV Piggyback:50] Out: 1856 [Urine:1750; Chest Tube:95]  Current Meds: Scheduled Meds: . acetaminophen  1,000 mg Oral Q6H   Or  . acetaminophen (TYLENOL) oral liquid 160 mg/5 mL  1,000 mg Per Tube Q6H  . aspirin EC  325 mg Oral Daily   Or  . aspirin  324 mg Per Tube Daily  . atorvastatin  80 mg Oral q1800  . bisacodyl  10 mg Oral Daily   Or  . bisacodyl  10 mg Rectal Daily  . chlorhexidine gluconate (MEDLINE KIT)  15 mL Mouth Rinse BID  . docusate sodium  200 mg Oral Daily  . enoxaparin (LOVENOX) injection  40 mg Subcutaneous QHS  . furosemide  20 mg Intravenous BID  . gabapentin  100 mg Oral TID  . insulin aspart  0-24 Units Subcutaneous Q4H   . insulin detemir  14 Units Subcutaneous Daily  . insulin regular  0-10 Units Intravenous TID WC  . levalbuterol  0.63 mg Nebulization Q6H  . levothyroxine  150 mcg Oral QAC breakfast  . mouth rinse  15 mL Mouth Rinse BID  . metoCLOPramide (REGLAN) injection  10 mg Intravenous Q6H  . metoprolol tartrate  12.5 mg Oral BID   Or  . metoprolol tartrate  12.5 mg Per Tube BID  . pantoprazole  40 mg Oral Daily  . sodium chloride flush  3 mL Intravenous Q12H   Continuous Infusions: . sodium chloride Stopped (02/25/17 1300)  . sodium chloride    . sodium chloride Stopped (02/25/17 1300)  . cefUROXime (ZINACEF)  IV Stopped (02/25/17 2042)  . dexmedetomidine (PRECEDEX) IV infusion Stopped (02/24/17 2010)  . DOPamine Stopped (02/25/17 1027)  . insulin (NOVOLIN-R) infusion Stopped (02/25/17 1307)  . lactated ringers    . lactated ringers 20 mL/hr at 02/26/17 0800  . lactated ringers Stopped (02/24/17 1700)  . nitroGLYCERIN Stopped (02/24/17 1406)  . phenylephrine (NEO-SYNEPHRINE) Adult infusion Stopped (02/24/17 2231)   PRN Meds:.sodium chloride, lactated ringers, Melatonin, metoprolol tartrate, midazolam, morphine injection, ondansetron (ZOFRAN) IV, oxyCODONE, sodium  chloride flush, traMADol  General appearance: cooperative and no distress Neurologic: intact Heart: RRR Lungs: Slightly decreased at bases Abdomen: Soft, obese, non tender, occasional bowel sounds present Extremities: SCD on the LLE and right lower extremity with edema. Ecchymosis RLE Wound: Aquacel intact;right lower leg dressings dry and intact  Lab Results: CBC: Recent Labs    02/25/17 1651 02/25/17 1656 02/26/17 0420  WBC 18.5*  --  15.6*  HGB 12.1 11.9* 11.1*  HCT 35.8* 35.0* 33.7*  PLT 245  --  238   BMET:  Recent Labs    02/25/17 0353  02/25/17 1656 02/26/17 0420  NA 135  --  135 136  K 3.7  --  4.7 4.2  CL 107  --  102 104  CO2 24  --   --  24  GLUCOSE 148*  --  212* 175*  BUN 9  --  13 11    CREATININE 0.74   < > 0.90 0.96  CALCIUM 8.1*  --   --  8.7*   < > = values in this interval not displayed.    CMET: Lab Results  Component Value Date   WBC 15.6 (H) 02/26/2017   HGB 11.1 (L) 02/26/2017   HCT 33.7 (L) 02/26/2017   PLT 238 02/26/2017   GLUCOSE 175 (H) 02/26/2017   CHOL 125 02/17/2017   TRIG 358 (H) 02/17/2017   HDL 30 (L) 02/17/2017   LDLCALC 23 02/17/2017   ALT 20 02/19/2017   AST 19 02/19/2017   NA 136 02/26/2017   K 4.2 02/26/2017   CL 104 02/26/2017   CREATININE 0.96 02/26/2017   BUN 11 02/26/2017   CO2 24 02/26/2017   TSH 0.950 02/17/2017   INR 1.13 02/24/2017   HGBA1C 9.0 (H) 02/19/2017      PT/INR:  Recent Labs    02/24/17 1403  LABPROT 14.4  INR 1.13   Radiology: No results found.   Assessment/Plan: S/P Procedure(s) (LRB): CORONARY ARTERY BYPASS GRAFTING (CABG) USING LEFT INTERNAL MAMMARY ARTERY TO LAD AND ENDOSCOPICALLY HARVESTED GREATER SAPHENOUS VEIN TO PDA AND TO OM1. (N/A) TRANSESOPHAGEAL ECHOCARDIOGRAM (TEE) (N/A) ENDOVEIN HARVEST OF GREATER SAPHENOUS VEIN (Right)  1. CV-Slightly tachy in low 100's.  On Lopressor 12.5 mg bid but will increase to 25 mg bid for better HR and BP control. EKG this am. 2. Pulmonary-On 2 liters of oxygen via [place]. Will wean over the next few days. Chest tube output 95 cc last 24 hours. CXR this am appears to show patient is rotated to the right, low lung volumes, no pneumothorax, small pleural effusions. Will remove chest tube. Encourage incentive spirometer. 3. Volume overload-Will give IV Lasix today 4. ABL anemia- H and H 11.1 and 33.7 5. DM-CBGs 183/184/172. On Insulin but will increase for better glucose control. Will not schedule Insulin at this time as not taking much po. Pre op HGA1C 9. Will restart Metformin and Glipizide once tolerating po better-likely in am 6. Remove foley 7. Ambulate   Donielle Liston Alba PA-C 02/26/2017 8:12 AM   Slow to mobilize  ekg reviewed, admitted with acute  occulusion of rca  I have seen and examined Leida Lauth and agree with the above assessment  and plan.  Grace Isaac MD [facility] [phone] Office [phone] 02/26/2017 3:08 PM

## 2017-02-26 NOTE — Progress Notes (Signed)
EKG CRITICAL VALUE     12 lead EKG performed.  Critical value noted.melody wright, RN notified.   Delfin Edis, [place] 02/26/2017 9:04 AM

## 2017-02-26 NOTE — Anesthesia Postprocedure Evaluation (Signed)
Anesthesia Post Note  Patient: Theresa Ray  Procedure(s) Performed: CORONARY ARTERY BYPASS GRAFTING (CABG) USING LEFT INTERNAL MAMMARY ARTERY TO LAD AND ENDOSCOPICALLY HARVESTED GREATER SAPHENOUS VEIN TO PDA AND TO OM1. (N/A Chest) TRANSESOPHAGEAL ECHOCARDIOGRAM (TEE) (N/A ) ENDOVEIN HARVEST OF GREATER SAPHENOUS VEIN (Right Leg Upper)     Patient location during evaluation: SICU Anesthesia Type: General Level of consciousness: patient cooperative and oriented (sleeping) Pain management: pain level controlled Vital Signs Assessment: post-procedure vital signs reviewed and stable Respiratory status: spontaneous breathing, nonlabored ventilation, patient connected to nasal cannula oxygen and respiratory function stable Cardiovascular status: blood pressure returned to baseline and stable (off of pressors) Postop Assessment: no apparent nausea or vomiting Anesthetic complications: no    Last Vitals:  Vitals:   02/26/17 0100 02/26/17 0200  BP: 122/70 (!) 141/66  Pulse: 83 88  Resp: 11 13  Temp:    SpO2: 95% 95%    Last Pain:  Vitals:   02/25/17 2000  TempSrc: Oral  PainSc:                  Topacio Cella,E. Ramsey Midgett

## 2017-02-26 NOTE — Progress Notes (Signed)
Patient examined and record reviewed.Hemodynamics stable,labs satisfactory.Patient had stable day.Continue current care. Theresa Ray 02/26/2017

## 2017-02-27 ENCOUNTER — Inpatient Hospital Stay (HOSPITAL_COMMUNITY): Payer: Medicare HMO

## 2017-02-27 LAB — CBC
HCT: 32.1 % — ABNORMAL LOW (ref 36.0–46.0)
Hemoglobin: 10.5 g/dL — ABNORMAL LOW (ref 12.0–15.0)
MCH: 29.7 pg (ref 26.0–34.0)
MCHC: 32.7 g/dL (ref 30.0–36.0)
MCV: 90.9 fL (ref 78.0–100.0)
Platelets: 251 10*3/uL (ref 150–400)
RBC: 3.53 MIL/uL — ABNORMAL LOW (ref 3.87–5.11)
RDW: 13.5 % (ref 11.5–15.5)
WBC: 13.1 10*3/uL — ABNORMAL HIGH (ref 4.0–10.5)

## 2017-02-27 LAB — GLUCOSE, CAPILLARY
Glucose-Capillary: 138 mg/dL — ABNORMAL HIGH (ref 65–99)
Glucose-Capillary: 166 mg/dL — ABNORMAL HIGH (ref 65–99)
Glucose-Capillary: 183 mg/dL — ABNORMAL HIGH (ref 65–99)
Glucose-Capillary: 175 mg/dL — ABNORMAL HIGH (ref 65–99)
Glucose-Capillary: 117 mg/dL — ABNORMAL HIGH (ref 65–99)

## 2017-02-27 LAB — BASIC METABOLIC PANEL
Anion gap: 7 (ref 5–15)
BUN: 10 mg/dL (ref 6–20)
CO2: 27 mmol/L (ref 22–32)
Calcium: 8.4 mg/dL — ABNORMAL LOW (ref 8.9–10.3)
Chloride: 103 mmol/L (ref 101–111)
Creatinine, Ser: 0.82 mg/dL (ref 0.44–1.00)
GFR calc Af Amer: >60 mL/min (ref >60)
GFR calc non Af Amer: >60 mL/min (ref >60)
Glucose, Bld: 132 mg/dL — ABNORMAL HIGH (ref 65–99)
Potassium: 3.5 mmol/L (ref 3.5–5.1)
Sodium: 137 mmol/L (ref 135–145)

## 2017-02-27 MED ORDER — BISACODYL 5 MG PO TBEC: 10.0000 mg | DELAYED_RELEASE_TABLET | Freq: Every day | ORAL | Status: DC | PRN

## 2017-02-27 MED ORDER — OXYCODONE HCL 5 MG PO TABS
5.0000 mg | ORAL_TABLET | ORAL | Status: DC | PRN
Start: 2017-02-27 — End: 2017-03-03

## 2017-02-27 MED ORDER — POTASSIUM CHLORIDE CRYS ER 20 MEQ PO TBCR
20.0000 meq | EXTENDED_RELEASE_TABLET | Freq: Every day | ORAL | Status: DC
  Administered 2017-02-27: 20 meq via ORAL
  Filled 2017-02-27: qty 1

## 2017-02-27 MED ORDER — TRAMADOL HCL 50 MG PO TABS: 50.0000 mg | ORAL_TABLET | ORAL | Status: DC | PRN

## 2017-02-27 MED ORDER — GLIPIZIDE 10 MG PO TABS
10.0000 mg | ORAL_TABLET | Freq: Two times a day (BID) | ORAL | Status: DC
  Administered 2017-02-27 – 2017-03-03 (×8): 10 mg via ORAL
  Filled 2017-02-27 (×9): qty 1

## 2017-02-27 MED ORDER — BISACODYL 10 MG RE SUPP: 10.0000 mg | Freq: Every day | RECTAL | Status: DC | PRN

## 2017-02-27 MED ORDER — ACETAMINOPHEN 325 MG PO TABS
650.0000 mg | ORAL_TABLET | Freq: Four times a day (QID) | ORAL | Status: DC | PRN
  Administered 2017-02-27 (×2): 650 mg via ORAL
  Filled 2017-02-27 (×2): qty 2

## 2017-02-27 MED ORDER — SODIUM CHLORIDE 0.9% FLUSH
3.0000 mL | Freq: Two times a day (BID) | INTRAVENOUS | Status: DC
  Administered 2017-02-28 – 2017-03-02 (×4): 3 mL via INTRAVENOUS

## 2017-02-27 MED ORDER — SODIUM CHLORIDE 0.9% FLUSH: 3.0000 mL | INTRAVENOUS | Status: DC | PRN

## 2017-02-27 MED ORDER — PANTOPRAZOLE SODIUM 40 MG PO TBEC
40.0000 mg | DELAYED_RELEASE_TABLET | Freq: Every day | ORAL | Status: DC
  Administered 2017-02-27 – 2017-03-03 (×5): 40 mg via ORAL
  Filled 2017-02-27 (×5): qty 1

## 2017-02-27 MED ORDER — FUROSEMIDE 40 MG PO TABS
40.0000 mg | ORAL_TABLET | Freq: Every day | ORAL | Status: DC
  Administered 2017-02-27 – 2017-03-03 (×5): 40 mg via ORAL
  Filled 2017-02-27 (×5): qty 1

## 2017-02-27 MED ORDER — MOVING RIGHT ALONG BOOK
Freq: Once | Status: AC
  Administered 2017-02-28: 17:00:00
  Filled 2017-02-27: qty 1

## 2017-02-27 MED ORDER — SODIUM CHLORIDE 0.9 % IV SOLN: 250.0000 mL | INTRAVENOUS | Status: DC | PRN

## 2017-02-27 MED ORDER — AMLODIPINE BESYLATE 10 MG PO TABS
10.0000 mg | ORAL_TABLET | Freq: Every day | ORAL | Status: DC
  Administered 2017-02-27 – 2017-03-03 (×5): 10 mg via ORAL
  Filled 2017-02-27 (×5): qty 1

## 2017-02-27 MED ORDER — ONDANSETRON HCL 4 MG/2ML IJ SOLN: 4.0000 mg | Freq: Four times a day (QID) | INTRAMUSCULAR | Status: DC | PRN

## 2017-02-27 MED ORDER — ALUM & MAG HYDROXIDE-SIMETH 200-200-20 MG/5ML PO SUSP: 15.0000 mL | ORAL | Status: DC | PRN

## 2017-02-27 MED ORDER — ONDANSETRON HCL 4 MG PO TABS
4.0000 mg | ORAL_TABLET | Freq: Four times a day (QID) | ORAL | Status: DC | PRN
  Administered 2017-03-01 – 2017-03-02 (×2): 4 mg via ORAL
  Filled 2017-02-27 (×2): qty 1

## 2017-02-27 MED ORDER — INSULIN ASPART 100 UNIT/ML ~~LOC~~ SOLN
0.0000 [IU] | Freq: Three times a day (TID) | SUBCUTANEOUS | Status: DC
  Administered 2017-02-27 (×2): 4 [IU] via SUBCUTANEOUS
  Administered 2017-02-28: 8 [IU] via SUBCUTANEOUS
  Administered 2017-03-01: 2 [IU] via SUBCUTANEOUS
  Administered 2017-03-01 – 2017-03-02 (×2): 4 [IU] via SUBCUTANEOUS
  Administered 2017-03-02: 2 [IU] via SUBCUTANEOUS

## 2017-02-27 MED ORDER — INSULIN STARTER KIT- PEN NEEDLES (ENGLISH)
1.0000 | Freq: Once | Status: DC
  Filled 2017-02-27: qty 1

## 2017-02-27 MED ORDER — DOCUSATE SODIUM 100 MG PO CAPS
200.0000 mg | ORAL_CAPSULE | Freq: Every day | ORAL | Status: DC
  Administered 2017-02-27 – 2017-03-02 (×3): 200 mg via ORAL
  Filled 2017-02-27 (×5): qty 2

## 2017-02-27 MED ORDER — GUAIFENESIN ER 600 MG PO TB12: 600.0000 mg | ORAL_TABLET | Freq: Two times a day (BID) | ORAL | Status: DC | PRN

## 2017-02-27 MED ORDER — METFORMIN HCL 500 MG PO TABS
1000.0000 mg | ORAL_TABLET | Freq: Two times a day (BID) | ORAL | Status: DC
  Administered 2017-02-27 – 2017-03-03 (×9): 1000 mg via ORAL
  Filled 2017-02-27 (×9): qty 2

## 2017-02-27 MED FILL — Mannitol IV Soln 20%: INTRAVENOUS | Qty: 500 | Status: AC

## 2017-02-27 MED FILL — Heparin Sodium (Porcine) Inj 1000 Unit/ML: INTRAMUSCULAR | Qty: 30 | Status: AC

## 2017-02-27 MED FILL — Lidocaine HCl IV Inj 20 MG/ML: INTRAVENOUS | Qty: 5 | Status: AC

## 2017-02-27 MED FILL — Sodium Chloride IV Soln 0.9%: INTRAVENOUS | Qty: 2000 | Status: AC

## 2017-02-27 MED FILL — Electrolyte-R (PH 7.4) Solution: INTRAVENOUS | Qty: 4000 | Status: AC

## 2017-02-27 MED FILL — Sodium Bicarbonate IV Soln 8.4%: INTRAVENOUS | Qty: 50 | Status: AC

## 2017-02-27 NOTE — Plan of Care (Signed)
Pt is improving by the day.

## 2017-02-27 NOTE — Progress Notes (Signed)
Spoke with patient and husband about the insulin pen. Given demonstration of procedure; husband was able to return demonstration properly. Encouraged patient to give own injection next time staff RN was to give insulin.  Patient was just transferred to room and was a bit "groggy", so follow up with patient on insulin administration will need to be repeated if patient is to go home on insulin.   Harvel Ricks RN BSN CDE Diabetes Coordinator Pager: [phone]  8am-5pm

## 2017-02-27 NOTE — Progress Notes (Signed)
Patient ID: Theresa Ray, female   DOB: [birth year], 71 y.o.   MRN: [id number] TCTS DAILY ICU PROGRESS NOTE                   [address]            [place] [address]          [phone]   3 Days Post-Op Procedure(s) (LRB): CORONARY ARTERY BYPASS GRAFTING (CABG) USING LEFT INTERNAL MAMMARY ARTERY TO LAD AND ENDOSCOPICALLY HARVESTED GREATER SAPHENOUS VEIN TO PDA AND TO OM1. (N/A) TRANSESOPHAGEAL ECHOCARDIOGRAM (TEE) (N/A) ENDOVEIN HARVEST OF GREATER SAPHENOUS VEIN (Right)  Total Length of Stay:  LOS: 11 days   Subjective: Patient awake alert, in chair this morning  Objective: Vital signs in last 24 hours: Temp:  [98.4 F (36.9 C)-98.6 F (37 C)] 98.6 F (37 C) (11/29 0300) Pulse Rate:  [82-116] 92 (11/29 0700) Cardiac Rhythm: Normal sinus rhythm (11/29 0400) Resp:  [10-21] 16 (11/29 0700) BP: (122-178)/(62-101) 155/95 (11/29 0700) SpO2:  [89 %-99 %] 99 % (11/29 0756) Weight:  [213 lb 1.6 oz (96.7 kg)] 213 lb 1.6 oz (96.7 kg) (11/29 0555)  Filed Weights   02/25/17 0430 02/26/17 0500 02/27/17 0555  Weight: 218 lb 11.1 oz (99.2 kg) 217 lb 6 oz (98.6 kg) 213 lb 1.6 oz (96.7 kg)    Weight change: -4.4 oz (-1.939 kg)   Hemodynamic parameters for last 24 hours:    Intake/Output from previous day: 11/28 0701 - 11/29 0700 In: 689.7 [P.O.:475; I.V.:164.7; IV Piggyback:50] Out: 2110 [Urine:2110]  Intake/Output this shift: No intake/output data recorded.  Current Meds: Scheduled Meds: . acetaminophen  1,000 mg Oral Q6H   Or  . acetaminophen (TYLENOL) oral liquid 160 mg/5 mL  1,000 mg Per Tube Q6H  . aspirin EC  325 mg Oral Daily   Or  . aspirin  324 mg Per Tube Daily  . atorvastatin  80 mg Oral q1800  . bisacodyl  10 mg Oral Daily   Or  . bisacodyl  10 mg Rectal Daily  . docusate sodium  200 mg Oral Daily  . enoxaparin (LOVENOX) injection  40 mg Subcutaneous QHS  . gabapentin  100 mg Oral TID  . insulin aspart  0-24 Units Subcutaneous Q4H  .  insulin detemir  18 Units Subcutaneous Daily  . levalbuterol  0.63 mg Nebulization Q6H  . levothyroxine  150 mcg Oral QAC breakfast  . metoCLOPramide (REGLAN) injection  10 mg Intravenous Q6H  . metoprolol tartrate  25 mg Oral BID   Or  . metoprolol tartrate  25 mg Per Tube BID  . pantoprazole  40 mg Oral Daily  . sodium chloride flush  3 mL Intravenous Q12H   Continuous Infusions: . sodium chloride Stopped (02/25/17 1300)  . sodium chloride    . sodium chloride Stopped (02/25/17 1300)  . dexmedetomidine (PRECEDEX) IV infusion Stopped (02/24/17 2010)  . lactated ringers    . lactated ringers Stopped (02/26/17 1514)  . lactated ringers Stopped (02/24/17 1700)  . nitroGLYCERIN Stopped (02/24/17 1406)  . phenylephrine (NEO-SYNEPHRINE) Adult infusion Stopped (02/24/17 2231)   PRN Meds:.sodium chloride, lactated ringers, Melatonin, metoprolol tartrate, midazolam, morphine injection, ondansetron (ZOFRAN) IV, oxyCODONE, sodium chloride flush, traMADol  General appearance: alert, cooperative, appears older than stated age and no distress Neurologic: intact Heart: regular rate and rhythm, S1, S2 normal, no murmur, click, rub or gallop Lungs: diminished breath sounds bibasilar Abdomen: soft, non-tender; bowel sounds normal; no masses,  no organomegaly Extremities: edema Pedal edema bilaterally Wound: Sternum intact dressing in place  Lab Results: CBC: Recent Labs    02/26/17 0420 02/27/17 0449  WBC 15.6* 13.1*  HGB 11.1* 10.5*  HCT 33.7* 32.1*  PLT 238 251   BMET:  Recent Labs    02/26/17 0420 02/27/17 0449  NA 136 137  K 4.2 3.5  CL 104 103  CO2 24 27  GLUCOSE 175* 132*  BUN 11 10  CREATININE 0.96 0.82  CALCIUM 8.7* 8.4*    CMET: Lab Results  Component Value Date   WBC 13.1 (H) 02/27/2017   HGB 10.5 (L) 02/27/2017   HCT 32.1 (L) 02/27/2017   PLT 251 02/27/2017   GLUCOSE 132 (H) 02/27/2017   CHOL 125 02/17/2017   TRIG 358 (H) 02/17/2017   HDL 30 (L) 02/17/2017     LDLCALC 23 02/17/2017   ALT 20 02/19/2017   AST 19 02/19/2017   NA 137 02/27/2017   K 3.5 02/27/2017   CL 103 02/27/2017   CREATININE 0.82 02/27/2017   BUN 10 02/27/2017   CO2 27 02/27/2017   TSH 0.950 02/17/2017   INR 1.13 02/24/2017   HGBA1C 9.0 (H) 02/19/2017      PT/INR:  Recent Labs    02/24/17 1403  LABPROT 14.4  INR 1.13   Radiology: No results found.   Assessment/Plan: S/P Procedure(s) (LRB): CORONARY ARTERY BYPASS GRAFTING (CABG) USING LEFT INTERNAL MAMMARY ARTERY TO LAD AND ENDOSCOPICALLY HARVESTED GREATER SAPHENOUS VEIN TO PDA AND TO OM1. (N/A) TRANSESOPHAGEAL ECHOCARDIOGRAM (TEE) (N/A) ENDOVEIN HARVEST OF GREATER SAPHENOUS VEIN (Right) Mobilize Diuresis Diabetes control Plan for transfer to step-down: see transfer orders Expected Acute  Blood - loss Anemia- continue to monitor  Replace potassium    Grace Isaac 02/27/2017 8:09 AM

## 2017-02-27 NOTE — Plan of Care (Signed)
  Activity: Risk for activity intolerance will decrease 02/27/2017 2352 - Progressing by Peggye Pitt, RN   Skin Integrity: Risk for impaired skin integrity will decrease 02/27/2017 2352 - Progressing by Peggye Pitt, RN

## 2017-02-27 NOTE — Progress Notes (Signed)
CARDIAC REHAB PHASE I   PRE:  Rate/Rhythm: 99 SR  BP:  Sitting: 134/85        SaO2: 93 RA  MODE:  Ambulation: 240 ft   POST:  Rate/Rhythm: 113 ST  BP:  Sitting: 151/79         SaO2: 93 RA  Pt in bed, states she has walked twice today, agreeable to walk again. Pt required moderate assistance from lying to sitting position, cues for sternal precautions, however, stood independently. Pt ambulated 240 ft on RA, RW, assist x1, slow, steady gait, tolerated fairly well. Pt c/o general fatigue with distance, denies any other complaints, standing rest x3. Pt required assist to raise legs into bed, however, with much encouragement, pt was able to accomplish more independently/min assist. Pt to bed per pt request after walk, call bell within reach. Pt will likely need RW for home use. Will follow.   [place], RN, BSN 02/27/2017 2:19 PM

## 2017-02-27 NOTE — Progress Notes (Addendum)
Inpatient Diabetes Program Recommendations  AACE/ADA: New Consensus Statement on Inpatient Glycemic Control (2015)  Target Ranges:  Prepandial:   less than 140 mg/dL      Peak postprandial:   less than 180 mg/dL (1-2 hours)      Critically ill patients:  140 - 180 mg/dL   Results for Ray, [name] (MRN [id number]) as of 02/27/2017 09:49  Ref. Range 02/26/2017 08:21 02/26/2017 11:18 02/26/2017 16:09 02/26/2017 19:33 02/26/2017 23:44 02/27/2017 04:00  Glucose-Capillary Latest Ref Range: 65 - 99 mg/dL 209 (H) 214 (H) 207 (H) 190 (H) 140 (H) 138 (H)  Results for Ray, [name] (MRN [id number]) as of 02/27/2017 09:49  Ref. Range 02/16/2017 19:03 02/19/2017 22:06  Hemoglobin A1C Latest Ref Range: 4.8 - 5.6 % 8.8 (H) 9.0 (H)   Review of Glycemic Control  Diabetes history: DM2 Outpatient Diabetes medications: Januvia 50 mg daily, Metformin 1000 mg BID, Glipizide 10 mg BID Current orders for Inpatient glycemic control: Levemir 18 units daily, Novolog 0-24 units ACHS, Metformin 1000 mg BID, Glipizide 10 mg BID  Inpatient Diabetes Program Recommendations:  Correction (SSI): Noted frequency of CBGs and Novolog changed to ACHS today.  Oral Agents: Noted Metformin and Glipizide resumed today.  HgbA1C: A1C 9.0% on 02/19/17 indicating an average glucose of 212 mg/dl. Patient was seen by Diabetes Coordinator, J. Simpson, MSN, RN, BC-ADM on 02/21/17 regarding A1C, importance of DM control especially following CABG, and potential need for insulin at discharge.  Education: Recommend, bedside NURSING begin educating patient on insulin in case patient is discharged new to insulin. Ordered insulin starter kit, patient education videos on insulin injections, and patient education by bedside RNs. Outpatient DM medications: MD, please indicate in note if patient will be discharging on insulin.  Agree with current medication regimen for DM control at this time. Spoke with Josh, RN and asked that bedside nursing go  ahead and begin educating patient on insulin in case she is discharged new to insulin. Will follow trends as more data is collected to determine if further adjustments are needed.   Addendum 02/27/17@13:37-Spoke with patient over the phone (DM Coordinator working from [place] [facility] today) regarding DM control. Reiterated to patient about importance of good DM control following surgery. Patient reports good compliance with outpatient DM medication regimen and reports that glucose was usually 150-190 mg/dl when she checked at home. Inquired about dietary compliance and patient stated she followed carb modified diet "so-so." Inquired if patient felt she would benefit from RD consult and patient feels that she would. Will order RD consult for diet education. Asked if bedside RNs had started teaching about insulin and patient reports that she has not been told anything about insulin yet. Discussed with patient that it was noted that her oral DM medications (Metformin and Glipizide) were restarted today and there was a possibility that insulin may be needed as well as an outpatient. Informed patient that insulin pen starter kit, patient education videos on insulin, and patient education by BEDSIDE NURSING has been ordered. Explained to patient that at this point it is unclear if MD will discharge on insulin but staff will go ahead and begin educating on insulin in case it is prescribed at discharge. Patient verbalized understanding of information and asked that her husband be educated on insulin administration as well. Talked with Holly, RN and asked that insulin teaching be started with patient in case she is discharged new to insulin.  Thanks, Marie Byrd, RN, MSN, CDE Diabetes Coordinator Inpatient Diabetes   Program [phone] (Team Pager from 8am to [place])

## 2017-02-28 ENCOUNTER — Inpatient Hospital Stay (HOSPITAL_COMMUNITY): Payer: Medicare HMO

## 2017-02-28 DIAGNOSIS — I480 Paroxysmal atrial fibrillation: Secondary | ICD-10-CM

## 2017-02-28 DIAGNOSIS — Z951 Presence of aortocoronary bypass graft: Secondary | ICD-10-CM

## 2017-02-28 LAB — GLUCOSE, CAPILLARY
GLUCOSE-CAPILLARY: 220 mg/dL — AB (ref 65–99)
GLUCOSE-CAPILLARY: 87 mg/dL (ref 65–99)
Glucose-Capillary: 235 mg/dL — ABNORMAL HIGH (ref 65–99)

## 2017-02-28 LAB — CBC
HCT: 34.7 % — ABNORMAL LOW (ref 36.0–46.0)
Hemoglobin: 11.8 g/dL — ABNORMAL LOW (ref 12.0–15.0)
MCH: 30.6 pg (ref 26.0–34.0)
MCHC: 34 g/dL (ref 30.0–36.0)
MCV: 90.1 fL (ref 78.0–100.0)
Platelets: 326 10*3/uL (ref 150–400)
RBC: 3.85 MIL/uL — ABNORMAL LOW (ref 3.87–5.11)
RDW: 13.2 % (ref 11.5–15.5)
WBC: 13.2 10*3/uL — ABNORMAL HIGH (ref 4.0–10.5)

## 2017-02-28 LAB — BASIC METABOLIC PANEL
Anion gap: 11 (ref 5–15)
BUN: 10 mg/dL (ref 6–20)
CO2: 24 mmol/L (ref 22–32)
Calcium: 8.6 mg/dL — ABNORMAL LOW (ref 8.9–10.3)
Chloride: 99 mmol/L — ABNORMAL LOW (ref 101–111)
Creatinine, Ser: 0.8 mg/dL (ref 0.44–1.00)
GFR calc Af Amer: >60 mL/min (ref >60)
GFR calc non Af Amer: >60 mL/min (ref >60)
Glucose, Bld: 175 mg/dL — ABNORMAL HIGH (ref 65–99)
Potassium: 3.3 mmol/L — ABNORMAL LOW (ref 3.5–5.1)
Sodium: 134 mmol/L — ABNORMAL LOW (ref 135–145)

## 2017-02-28 MED ORDER — AMIODARONE HCL 200 MG PO TABS
200.0000 mg | ORAL_TABLET | Freq: Two times a day (BID) | ORAL | Status: DC
  Administered 2017-03-01 – 2017-03-03 (×6): 200 mg via ORAL
  Filled 2017-02-28 (×6): qty 1

## 2017-02-28 MED ORDER — METOPROLOL TARTRATE 25 MG PO TABS
37.5000 mg | ORAL_TABLET | Freq: Two times a day (BID) | ORAL | Status: DC
  Administered 2017-02-28 – 2017-03-03 (×7): 37.5 mg via ORAL
  Filled 2017-02-28 (×7): qty 1

## 2017-02-28 MED ORDER — AMIODARONE LOAD VIA INFUSION
150.0000 mg | Freq: Once | INTRAVENOUS | Status: AC
  Administered 2017-02-28: 150 mg via INTRAVENOUS
  Filled 2017-02-28: qty 83.34

## 2017-02-28 MED ORDER — LACTULOSE 10 GM/15ML PO SOLN
20.0000 g | Freq: Once | ORAL | Status: AC
  Administered 2017-02-28: 20 g via ORAL
  Filled 2017-02-28: qty 30

## 2017-02-28 MED ORDER — POLYETHYLENE GLYCOL 3350 17 G PO PACK
17.0000 g | PACK | Freq: Every day | ORAL | Status: DC
  Administered 2017-03-02: 17 g via ORAL
  Filled 2017-02-28 (×3): qty 1

## 2017-02-28 MED ORDER — INSULIN DETEMIR 100 UNIT/ML ~~LOC~~ SOLN
22.0000 [IU] | Freq: Every day | SUBCUTANEOUS | Status: DC
  Administered 2017-02-28 – 2017-03-03 (×4): 22 [IU] via SUBCUTANEOUS
  Filled 2017-02-28 (×4): qty 0.22

## 2017-02-28 MED ORDER — LEVALBUTEROL HCL 0.63 MG/3ML IN NEBU
0.6300 mg | INHALATION_SOLUTION | Freq: Two times a day (BID) | RESPIRATORY_TRACT | Status: DC
  Administered 2017-02-28 – 2017-03-02 (×3): 0.63 mg via RESPIRATORY_TRACT
  Filled 2017-02-28 (×9): qty 3

## 2017-02-28 MED ORDER — POTASSIUM CHLORIDE CRYS ER 20 MEQ PO TBCR
40.0000 meq | EXTENDED_RELEASE_TABLET | Freq: Two times a day (BID) | ORAL | Status: DC
  Administered 2017-02-28 – 2017-03-01 (×4): 40 meq via ORAL
  Filled 2017-02-28 (×5): qty 2

## 2017-02-28 MED ORDER — AMIODARONE HCL IN DEXTROSE 360-4.14 MG/200ML-% IV SOLN
30.0000 mg/h | INTRAVENOUS | Status: DC
  Administered 2017-02-28: 30 mg/h via INTRAVENOUS
  Filled 2017-02-28: qty 200

## 2017-02-28 MED ORDER — METOPROLOL TARTRATE 5 MG/5ML IV SOLN
5.0000 mg | Freq: Once | INTRAVENOUS | Status: AC
  Administered 2017-02-28: 5 mg via INTRAVENOUS
  Filled 2017-02-28: qty 5

## 2017-02-28 MED ORDER — AMIODARONE HCL 200 MG PO TABS: 200.0000 mg | ORAL_TABLET | Freq: Every day | ORAL | Status: DC

## 2017-02-28 MED ORDER — METOPROLOL TARTRATE 25 MG/10 ML ORAL SUSPENSION
37.5000 mg | Freq: Two times a day (BID) | ORAL | Status: DC
  Filled 2017-02-28 (×2): qty 20

## 2017-02-28 MED ORDER — LEVALBUTEROL HCL 0.63 MG/3ML IN NEBU: 0.6300 mg | INHALATION_SOLUTION | Freq: Four times a day (QID) | RESPIRATORY_TRACT | Status: DC | PRN

## 2017-02-28 MED ORDER — LEVALBUTEROL HCL 0.63 MG/3ML IN NEBU: 0.6300 mg | INHALATION_SOLUTION | Freq: Two times a day (BID) | RESPIRATORY_TRACT | Status: DC

## 2017-02-28 MED ORDER — AMIODARONE HCL IN DEXTROSE 360-4.14 MG/200ML-% IV SOLN
60.0000 mg/h | INTRAVENOUS | Status: DC
  Administered 2017-02-28 (×2): 60 mg/h via INTRAVENOUS
  Filled 2017-02-28 (×2): qty 200

## 2017-02-28 NOTE — Progress Notes (Signed)
Progress Note  Patient Name: Theresa Ray Date of Encounter: 02/28/2017  Primary Cardiologist: Dr. Angelena Form  Subjective   She felt palpitation while in afib. No chest pain or shortness of breath.   Inpatient Medications    Scheduled Meds: . [START ON 03/01/2017] amiodarone  200 mg Oral Q12H   Followed by  . [START ON 03/08/2017] amiodarone  200 mg Oral Daily  . amLODipine  10 mg Oral Daily  . aspirin EC  325 mg Oral Daily   Or  . aspirin  324 mg Per Tube Daily  . atorvastatin  80 mg Oral q1800  . docusate sodium  200 mg Oral Daily  . enoxaparin (LOVENOX) injection  40 mg Subcutaneous QHS  . furosemide  40 mg Oral Daily  . gabapentin  100 mg Oral TID  . glipiZIDE  10 mg Oral BID WC  . insulin aspart  0-24 Units Subcutaneous TID AC & HS  . insulin detemir  22 Units Subcutaneous Daily  . insulin starter kit- pen needles  1 kit Other Once  . levalbuterol  0.63 mg Nebulization BID  . levothyroxine  150 mcg Oral QAC breakfast  . metFORMIN  1,000 mg Oral BID WC  . metoprolol tartrate  37.5 mg Oral BID   Or  . metoprolol tartrate  37.5 mg Per Tube BID  . moving right along book   Does not apply Once  . pantoprazole  40 mg Oral QAC breakfast  . polyethylene glycol  17 g Oral Daily  . potassium chloride  40 mEq Oral BID  . sodium chloride flush  3 mL Intravenous Q12H   Continuous Infusions: . sodium chloride    . amiodarone 30 mg/hr (02/28/17 1126)   PRN Meds: sodium chloride, acetaminophen, alum & mag hydroxide-simeth, bisacodyl **OR** bisacodyl, guaiFENesin, levalbuterol, Melatonin, ondansetron **OR** ondansetron (ZOFRAN) IV, oxyCODONE, sodium chloride flush, traMADol   Vital Signs    Vitals:   02/28/17 0418 02/28/17 0805 02/28/17 0823 02/28/17 1131  BP: (!) 157/77 (!) 137/106 (!) 135/99 101/62  Pulse: 99 (!) 109    Resp: 16     Temp: 98.5 F (36.9 C) 98 F (36.7 C) 98 F (36.7 C) (!) 97.3 F (36.3 C)  TempSrc: Oral Oral Oral Oral  SpO2: 93%     Weight:        Height:       No intake or output data in the 24 hours ending 02/28/17 1452 Filed Weights   02/26/17 0500 02/27/17 0555 02/28/17 0240  Weight: 217 lb 6 oz (98.6 kg) 213 lb 1.6 oz (96.7 kg) 208 lb 14.4 oz (94.8 kg)    Telemetry    AFib episode early morning, currently in sinus rhythm  - Personally Reviewed  ECG    N/A  Physical Exam   GEN: No acute distress.   Neck: No JVD Cardiac: RRR, no murmurs, rubs, or gallops.1+ BL LE edema. Mid sternal surgical scar healing well Respiratory: Diminished breath sound at base GI: Soft, nontender, non-distended  MS:  No deformity. Neuro:  Nonfocal  Psych: Normal affect   Labs    Chemistry Recent Labs  Lab 02/26/17 0420 02/27/17 0449 02/28/17 0235  NA 136 137 134*  K 4.2 3.5 3.3*  CL 104 103 99*  CO2 '24 27 24  ' GLUCOSE 175* 132* 175*  BUN '11 10 10  ' CREATININE 0.96 0.82 0.80  CALCIUM 8.7* 8.4* 8.6*  GFRNONAA 59* >60 >60  GFRAA >60 >60 >60  ANIONGAP 8 7  11     Hematology Recent Labs  Lab 02/26/17 0420 02/27/17 0449 02/28/17 0235  WBC 15.6* 13.1* 13.2*  RBC 3.71* 3.53* 3.85*  HGB 11.1* 10.5* 11.8*  HCT 33.7* 32.1* 34.7*  MCV 90.8 90.9 90.1  MCH 29.9 29.7 30.6  MCHC 32.9 32.7 34.0  RDW 13.5 13.5 13.2  PLT 238 251 326    Cardiac EnzymesNo results for input(s): TROPONINI in the last 168 hours. No results for input(s): TROPIPOC in the last 168 hours.   BNPNo results for input(s): BNP, PROBNP in the last 168 hours.   DDimer No results for input(s): DDIMER in the last 168 hours.   Radiology    Dg Chest 2 View  Result Date: 02/28/2017 CLINICAL DATA:  Postop checking EXAM: CHEST  2 VIEW COMPARISON:  02/27/2017 FINDINGS: Mild bilateral interstitial thickening. Mild right midlung atelectasis. Trace bilateral pleural effusion. No pneumothorax. No focal consolidation. Stable cardiomediastinal silhouette. Prior CABG. No acute osseous abnormality. IMPRESSION: 1. Cardiomegaly with mild pulmonary vascular congestion.  Electronically Signed   By: Kathreen Devoid   On: 02/28/2017 10:02   Dg Chest Port 1 View  Result Date: 02/27/2017 CLINICAL DATA:  Post CABG EXAM: PORTABLE CHEST 1 VIEW COMPARISON:  02/26/2017 FINDINGS: Changes of CABG. Low lung volumes with bibasilar atelectasis. Cardiomegaly with vascular congestion. Interval removal of left chest tube. No pneumothorax. IMPRESSION: Low lung volumes with bibasilar atelectasis. Cardiomegaly, vascular congestion. Electronically Signed   By: Rolm Baptise M.D.   On: 02/27/2017 08:35    Cardiac Studies   Cardiac cath 02/18/17:  Prox LAD lesion is 70% stenosed -at Diag 1. Dist LAD lesion is 99% stenosed.  Ost 1st Diag lesion is 85% stenosed. Very small caliber vessel  [place] Distal lesion is 100% stenosed.  Prox RCA lesion is 75% stenosed. Mid RCA to Dist RCA lesion is 100% stenosed.--The R PDA and PL systems fill via left-to-right collaterals with brisk flow.  Prox Cx lesion is 50% stenosed -proximal to OM1  Ost 1st Mrg lesion is 100% stenosed. Ostial occlusion. The vessel fills retrograde via left to left collaterals  Dist Cx lesion is 75% stenosed -in the band prior to small left posterolateral branch  There is mild left ventricular systolic dysfunction. The left ventricular ejection fraction is 50-55% by visual estimate.  LV end diastolic pressure is moderately elevated.  Diagnostic Diagram      Echo 02/17/17: - Left ventricle: The cavity size was normal. Systolic function was normal. The estimated ejection fraction was in the range of 55% to 60%. Probable hypokinesis of the basalinferior and inferoseptal myocardium; consistent with ischemia in the distribution of the right coronary artery. Features are consistent with a pseudonormal left ventricular filling pattern, with concomitant abnormal relaxation and increased filling pressure (grade 2 diastolic dysfunction). - Mitral valve: There was mild regurgitation.  Patient  Profile  71 y.o. female with history of DM, HTN admitted with chest pain and found to have a NSTEMI. 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 LV systolic function is normal. s/p CABG   Assessment & Plan    1. 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 LVEF normal by echo.  - S/p CABG 11/26 (LIMA to LAD; SVG to PDA and SVG to OM1) - No recurrent chest pain - Continue ASA, statin and BB  2. PAF - Has post of atrial fibrillation last night. Converted to sinus rhythm on IV amiodarone. Now on PO amiodarone.  - CHADVASCs score of at least 6 (  age, sex, HTN, CHF, vascular disease and DM). Consider anticoagulation.  - Up titrate BB as needed for additional rate control.   3. Acute diastolic CHF - Edema noted. Continue lasix.   4. HTN - BP relatively stable.   5. Hypokalemia - on suppliement   For questions or updates, please contact [place] Please consult [email or web address] for contact info under Cardiology/STEMI.      SignedCrista Luria [place], PA  02/28/2017, 2:52 PM    I have examined the patient and reviewed assessment and plan and discussed with patient.  Agree with above as stated.  High CHADS score as noted above.  Consdier anticoagulation and changing to PO amiodarone. Hopefully, AFib will be transient and become less frequent as time increases from surgery.  Larae Grooms

## 2017-02-28 NOTE — Progress Notes (Addendum)
[address]       [place] [address]             [phone]        4 Days Post-Op Procedure(s) (LRB): CORONARY ARTERY BYPASS GRAFTING (CABG) USING LEFT INTERNAL MAMMARY ARTERY TO LAD AND ENDOSCOPICALLY HARVESTED GREATER SAPHENOUS VEIN TO PDA AND TO OM1. (N/A) TRANSESOPHAGEAL ECHOCARDIOGRAM (TEE) (N/A) ENDOVEIN HARVEST OF GREATER SAPHENOUS VEIN (Right)  Subjective: Patient asking to use bathroom this am. Tech and I assisted her. I informed her she is to only go to bathroom and not be up ambulating until HR better controlled. She feels heart racing at times.  Objective: Vital signs in last 24 hours: Temp:  [98.3 F (36.8 C)-99.1 F (37.3 C)] 98.5 F (36.9 C) (11/30 0418) Pulse Rate:  [93-108] 99 (11/30 0418) Cardiac Rhythm: Atrial fibrillation (11/30 0526) Resp:  [14-19] 16 (11/30 0418) BP: (139-161)/(74-89) 157/77 (11/30 0418) SpO2:  [92 %-99 %] 93 % (11/30 0418) Weight:  [208 lb 14.4 oz (94.8 kg)] 208 lb 14.4 oz (94.8 kg) (11/30 0240)  Pre op weight 94.8 kg Current Weight  02/28/17 208 lb 14.4 oz (94.8 kg)    Intake/Output from previous day: No intake/output data recorded.   Physical Exam:  Cardiovascular: IRRR IRRR Pulmonary: Diminished at bases Abdomen: Soft, non tender, bowel sounds present. Extremities: Bilateral lower extremity edema. Wounds: RLE wound is clean and dry.  No erythema or signs of infection. Sternal wound has sero sanguinous ooze.  Lab Results: CBC: Recent Labs    02/27/17 0449 02/28/17 0235  WBC 13.1* 13.2*  HGB 10.5* 11.8*  HCT 32.1* 34.7*  PLT 251 326   BMET:  Recent Labs    02/27/17 0449 02/28/17 0235  NA 137 134*  K 3.5 3.3*  CL 103 99*  CO2 27 24  GLUCOSE 132* 175*  BUN 10 10  CREATININE 0.82 0.80  CALCIUM 8.4* 8.6*    PT/INR:  Lab Results  Component Value Date   INR 1.13 02/24/2017   INR 1.00 02/19/2017   INR 1.02 02/17/2017   ABG:  INR: Will add last result for INR, ABG once  components are confirmed Will add last 4 CBG results once components are confirmed  Assessment/Plan: 1. CV-  A fib with RVR. On Amiodarone drip, Amlodipine 10 mg daily and Lopressor 25 mg bid. Given bolus of Amiodarone recently so will try Lopressor IV and increase oral Lopressor. Will ask cardiology to see. 2. Pulmonary-On 2 liters of oxygen via [place]. Will wean over the next few days. CXR this am ordered but not taken yet. Encourage incentive spirometer. 3. Volume overload-On Lasix 40 mg daily.  4. ABL anemia- H and H 11.8 and 34.7 5. DM-CBGs 175/117/220. On Metformin 1000 mg bid, Glipizide 10 mg bid, Insulin but will increase for better glucose control. Pre op HGA1C 9.    6. Supplement potassium 7. LOC constipation   Donielle M ZimmermanPA-C 02/28/2017,7:41 AM  Dg Chest 2 View  Result Date: 02/28/2017 CLINICAL DATA:  Postop checking EXAM: CHEST  2 VIEW COMPARISON:  02/27/2017 FINDINGS: Mild bilateral interstitial thickening. Mild right midlung atelectasis. Trace bilateral pleural effusion. No pneumothorax. No focal consolidation. Stable cardiomediastinal silhouette. Prior CABG. No acute osseous abnormality. IMPRESSION: 1. Cardiomegaly with mild pulmonary vascular congestion. Electronically Signed   By: Kathreen Devoid   On: 02/28/2017 10:02   afib this am, started  on iv Cordarone today, now in sinus  Convert to po Cordarone  I  have seen and examined Theresa Ray and agree with the above assessment  and plan.  Grace Isaac MD Bee[place] [phone] Office [phone] 02/28/2017 2:14 PM

## 2017-02-28 NOTE — Care Management Important Message (Signed)
Important Message  Patient Details  Name: Theresa Ray MRN: [id number] Date of Birth: 02/27/1946   Medicare Important Message Given:  Yes    Nathen May 02/28/2017, 11:29 AM

## 2017-02-28 NOTE — Progress Notes (Signed)
CARDIAC REHAB PHASE I   PRE:  Rate/Rhythm: 87 SR  BP:  Sitting: 143/81        SaO2: 94 RA  MODE:  Ambulation: 200 ft   POST:  Rate/Rhythm: 86 SR  BP:  Sitting: 111/72         SaO2: 95 RA  Pt in bed, required moderate assist oob, improved since yesterday. Still requiring frequent verbal cues for sternal precautions, some small drainage noted from sternal incision. Pt ambulated 200 ft on RA, IV, rolling walker, assist x1, slow, mostly steady gait, tolerated fairly well. Pt c/o DOE, requested to sit, encouraged standing rest break, standing rest x1. Pt had some difficulty controlling RW, ran into wall with RW on multiple occasions. Pt to recliner after walk, feet elevated, call bell within reach. Encouraged IS, additional ambulation as tolerated. Will follow.   [place], RN, BSN 02/28/2017 3:24 PM

## 2017-02-28 NOTE — Progress Notes (Signed)
  Amiodarone Drug - Drug Interaction Consult Note  Recommendations: 11/28 QTc 468, monitor carefully. Monitor for muscle pain/weakness. Monitor K+ (current 3.3).  Monitor HR.  Amiodarone is metabolized by the cytochrome P450 system and therefore has the potential to cause many drug interactions. Amiodarone has an average plasma half-life of 50 days (range 20 to 100 days).   There is potential for drug interactions to occur several weeks or months after stopping treatment and the onset of drug interactions may be slow after initiating amiodarone.   [x]  Statins (atorvastatin): Increased risk of myopathy. Simvastatin- restrict dose to 20mg  daily. Other statins: counsel patients to report any muscle pain or weakness immediately.  []  Anticoagulants: Amiodarone can increase anticoagulant effect. Consider warfarin dose reduction. Patients should be monitored closely and the dose of anticoagulant altered accordingly, remembering that amiodarone levels take several weeks to stabilize.  []  Antiepileptics: Amiodarone can increase plasma concentration of phenytoin, the dose should be reduced. Note that small changes in phenytoin dose can result in large changes in levels. Monitor patient and counsel on signs of toxicity.  [x]  Beta blockers (metoprolol): increased risk of bradycardia, AV block and myocardial depression. Sotalol - avoid concomitant use.  []   Calcium channel blockers (diltiazem and verapamil): increased risk of bradycardia, AV block and myocardial depression.  []   Cyclosporine: Amiodarone increases levels of cyclosporine. Reduced dose of cyclosporine is recommended.  []  Digoxin dose should be halved when amiodarone is started.  [x]  Diuretics (furosemide): increased risk of cardiotoxicity if hypokalemia occurs.  []  Oral hypoglycemic agents (glyburide, glipizide, glimepiride): increased risk of hypoglycemia. Patient's glucose levels should be monitored closely when initiating amiodarone  therapy.   [x]  Drugs that prolong the QT interval:  Torsades de pointes risk may be increased with concurrent use - avoid if possible.  Monitor QTc, also keep magnesium/potassium WNL if concurrent therapy can't be avoided. Marland Kitchen Antibiotics: e.g. fluoroquinolones, erythromycin. . Antiarrhythmics: e.g. quinidine, procainamide, disopyramide, sotalol. . Antipsychotics: e.g. phenothiazines, haloperidol.  . Lithium, tricyclic antidepressants, and methadone.  Thank you. Wynona Neat, PharmD, BCPS  02/28/2017 5:18 AM

## 2017-02-28 NOTE — Progress Notes (Addendum)
PT Cancellation Note  Patient Details Name: Theresa Ray MRN: [id number] DOB: Jun 23, 1945   Cancelled Treatment:    Reason Eval/Treat Not Completed: Patient at procedure or test/unavailable. Arrival of transport to take pt for imaging; will follow up for PT evaluation as time allows.  Of note, per PA note, limiting pt's ambulation to bathroom privileges only secondary to increased HR.  Derry Lory 02/28/2017, 8:23 AM

## 2017-02-28 NOTE — Evaluation (Signed)
Physical Therapy Evaluation Patient Details Name: STUART GUILLEN MRN: [id number] DOB: 1945/06/27 Today's Date: 02/28/2017   History of Present Illness  Pt is a 71 y.o. female admitted 02/16/17 with chest pain and troponin is elevated c/w NSTEMI. Now s/p CABGx3 on 02/24/17. Pertinent PMH includes HTN, DM, TIA.    Clinical Impression  Pt presents with an overall decrease in functional mobility secondary to above. PTA, pt lives at home with husband and indep with mobility. Educ on precautions, positioning, and importance of mobility. Today, pt only willing to ambulate from bed to chair (c/o fatigue post-amb with cardiac rehab), but able to do so with no AD and min guard for balance. Pt would benefit from continued acute PT services to maximize functional mobility and independence prior to d/c with HHPT services.    Follow Up Recommendations Home health PT;Supervision - Intermittent    Equipment Recommendations  Rolling walker with 5" wheels    Recommendations for Other Services OT consult     Precautions / Restrictions Precautions Precautions: Sternal Restrictions Weight Bearing Restrictions: Yes Other Position/Activity Restrictions: Sternal      Mobility  Bed Mobility Overal bed mobility: Needs Assistance Bed Mobility: Rolling;Supine to Sit Rolling: Min guard   Supine to sit: Min guard     General bed mobility comments: Cues for log roll technique to maintain sternal precautions; HOB slightly elevated  Transfers Overall transfer level: Needs assistance Equipment used: None Transfers: Sit to/from Stand Sit to Stand: Min guard         General transfer comment: Reliant on momentum to boost into standing with BUEs on knees to maintain sternal precautions; min guard for balance but no physical assist required  Ambulation/Gait Ambulation/Gait assistance: Min guard Ambulation Distance (Feet): 3 Feet Assistive device: None Gait Pattern/deviations: Step-to  pattern;Antalgic Gait velocity: Decreased Gait velocity interpretation: <1.8 ft/sec, indicative of risk for recurrent falls General Gait Details: Pt only willing to amb to chair to sit up for dinner. Able to with no UE support and min guard for balance  Stairs            Wheelchair Mobility    Modified Rankin (Stroke Patients Only)       Balance Overall balance assessment: Needs assistance   Sitting balance-Leahy Scale: Fair       Standing balance-Leahy Scale: Fair                               Pertinent Vitals/Pain Pain Assessment: Faces Faces Pain Scale: Hurts a little bit Pain Location: Sternal incision Pain Descriptors / Indicators: Guarding;Sore Pain Intervention(s): Monitored during session    Home Living Family/patient expects to be discharged to:: Private residence Living Arrangements: Spouse/significant other Available Help at Discharge: Family;Available 24 hours/day Type of Home: House Home Access: Stairs to enter Entrance Stairs-Rails: Right Entrance Stairs-Number of Steps: 10 Home Layout: One level Home Equipment: Cane - single point;Shower seat      Prior Function Level of Independence: Independent         Comments: Works in [employer]        Extremity/Trunk Assessment   Upper Extremity Assessment Upper Extremity Assessment: RUE deficits/detail;LUE deficits/detail RUE Deficits / Details: sternal precautions LUE Deficits / Details: sternal precautions    Lower Extremity Assessment Lower Extremity Assessment: Overall WFL for tasks assessed       Communication   Communication: No difficulties  Cognition Arousal/Alertness: Awake/alert  Behavior During Therapy: Flat affect Overall Cognitive Status: Within Functional Limits for tasks assessed                                        General Comments General comments (skin integrity, edema, etc.): Husband present throughout  session    Exercises     Assessment/Plan    PT Assessment Patient needs continued PT services  PT Problem List Decreased strength;Decreased activity tolerance;Decreased balance;Decreased mobility;Decreased knowledge of use of DME;Decreased knowledge of precautions       PT Treatment Interventions DME instruction;Gait training;Stair training;Functional mobility training;Therapeutic activities;Therapeutic exercise;Balance training;Patient/family education    PT Goals (Current goals can be found in the Care Plan section)  Acute Rehab PT Goals Patient Stated Goal: Return home PT Goal Formulation: With patient Time For Goal Achievement: 03/14/17 Potential to Achieve Goals: Good    Frequency Min 3X/week   Barriers to discharge        Co-evaluation               AM-PAC PT "6 Clicks" Daily Activity  Outcome Measure Difficulty turning over in bed (including adjusting bedclothes, sheets and blankets)?: A Little Difficulty moving from lying on back to sitting on the side of the bed? : A Little Difficulty sitting down on and standing up from a chair with arms (e.g., wheelchair, bedside commode, etc,.)?: A Little Help needed moving to and from a bed to chair (including a wheelchair)?: A Little Help needed walking in hospital room?: A Little Help needed climbing 3-5 steps with a railing? : A Little 6 Click Score: 18    End of Session Equipment Utilized During Treatment: Gait belt Activity Tolerance: Patient tolerated treatment well Patient left: in chair;with call bell/phone within reach;with family/visitor present Nurse Communication: Mobility status PT Visit Diagnosis: Other abnormalities of gait and mobility (R26.89)    Time: 6283-1517 PT Time Calculation (min) (ACUTE ONLY): 20 min   Charges:   PT Evaluation $PT Eval Moderate Complexity: 1 Mod     PT G Codes:       Mabeline Caras, PT, DPT Acute Rehab Services  Pager: [place] 02/28/2017,  5:19 PM

## 2017-03-01 LAB — BASIC METABOLIC PANEL
Anion gap: 11 (ref 5–15)
BUN: 8 mg/dL (ref 6–20)
CO2: 24 mmol/L (ref 22–32)
Calcium: 8.6 mg/dL — ABNORMAL LOW (ref 8.9–10.3)
Chloride: 101 mmol/L (ref 101–111)
Creatinine, Ser: 0.74 mg/dL (ref 0.44–1.00)
GFR calc Af Amer: >60 mL/min (ref >60)
GFR calc non Af Amer: >60 mL/min (ref >60)
Glucose, Bld: 152 mg/dL — ABNORMAL HIGH (ref 65–99)
Potassium: 4.1 mmol/L (ref 3.5–5.1)
Sodium: 136 mmol/L (ref 135–145)

## 2017-03-01 LAB — GLUCOSE, CAPILLARY
GLUCOSE-CAPILLARY: 107 mg/dL — AB (ref 65–99)
Glucose-Capillary: 153 mg/dL — ABNORMAL HIGH (ref 65–99)
Glucose-Capillary: 176 mg/dL — ABNORMAL HIGH (ref 65–99)
Glucose-Capillary: 96 mg/dL (ref 65–99)

## 2017-03-01 LAB — MAGNESIUM: Magnesium: 1.6 mg/dL — ABNORMAL LOW (ref 1.7–2.4)

## 2017-03-01 LAB — TSH: TSH: 0.561 u[IU]/mL (ref 0.350–4.500)

## 2017-03-01 MED ORDER — LISINOPRIL 5 MG PO TABS
5.0000 mg | ORAL_TABLET | Freq: Every day | ORAL | Status: DC
  Administered 2017-03-01 – 2017-03-03 (×3): 5 mg via ORAL
  Filled 2017-03-01 (×2): qty 1

## 2017-03-01 MED ORDER — MAGNESIUM SULFATE IN D5W 1-5 GM/100ML-% IV SOLN
1.0000 g | Freq: Once | INTRAVENOUS | Status: AC
  Administered 2017-03-01: 1 g via INTRAVENOUS
  Filled 2017-03-01: qty 100

## 2017-03-01 MED ORDER — MAGNESIUM OXIDE 400 (241.3 MG) MG PO TABS
400.0000 mg | ORAL_TABLET | Freq: Every day | ORAL | Status: DC
  Administered 2017-03-02 – 2017-03-03 (×2): 400 mg via ORAL
  Filled 2017-03-01 (×2): qty 1

## 2017-03-01 NOTE — Progress Notes (Addendum)
      [address]       [place] [address]             [phone]        5 Days Post-Op Procedure(s) (LRB): CORONARY ARTERY BYPASS GRAFTING (CABG) USING LEFT INTERNAL MAMMARY ARTERY TO LAD AND ENDOSCOPICALLY HARVESTED GREATER SAPHENOUS VEIN TO PDA AND TO OM1. (N/A) TRANSESOPHAGEAL ECHOCARDIOGRAM (TEE) (N/A) ENDOVEIN HARVEST OF GREATER SAPHENOUS VEIN (Right)  Subjective: Patient with bowel movment  Objective: Vital signs in last 24 hours: Temp:  [97.3 F (36.3 C)-98.7 F (37.1 C)] 98.3 F (36.8 C) (12/01 0816) Pulse Rate:  [81-97] 97 (12/01 0816) Cardiac Rhythm: Normal sinus rhythm (12/01 0700) Resp:  [18] 18 (12/01 0444) BP: (101-143)/(62-77) 143/77 (12/01 0816) SpO2:  [93 %-95 %] 95 % (12/01 0841) Weight:  [204 lb 9.4 oz (92.8 kg)] 204 lb 9.4 oz (92.8 kg) (12/01 0642)  Pre op weight 94.8 kg Current Weight  03/01/17 204 lb 9.4 oz (92.8 kg)    Intake/Output from previous day: 11/30 0701 - 12/01 0700 In: 600.9 [I.V.:600.9] Out: -    Physical Exam:  Cardiovascular: RRR Pulmonary: Slightly diminished at bases Abdomen: Soft, non tender, bowel sounds present. Extremities: Bilateral lower extremity edema. Wounds: RLE wound is clean and dry.  No erythema or signs of infection. Sternal wound has sero sanguinous drainage, no erythema.  Lab Results: CBC: Recent Labs    02/27/17 0449 02/28/17 0235  WBC 13.1* 13.2*  HGB 10.5* 11.8*  HCT 32.1* 34.7*  PLT 251 326   BMET:  Recent Labs    02/28/17 0235 03/01/17 0236  NA 134* 136  K 3.3* 4.1  CL 99* 101  CO2 24 24  GLUCOSE 175* 152*  BUN 10 8  CREATININE 0.80 0.74  CALCIUM 8.6* 8.6*    PT/INR:  Lab Results  Component Value Date   INR 1.13 02/24/2017   INR 1.00 02/19/2017   INR 1.02 02/17/2017   ABG:  INR: Will add last result for INR, ABG once components are confirmed Will add last 4 CBG results once components are confirmed  Assessment/Plan: 1. CV-  A fib with RVR yesterday.  Converted to SR and remains in SR. On Amiodarone 200 mg bid, Amlodipine 10 mg daily and Lopressor 25 mg bid. Will start ACE for better BP control. 2. Pulmonary-On room air. Encourage incentive spirometer. 3. Volume overload-On Lasix 40 mg daily.  4. ABL anemia- H and H 11.8 and 34.7 5. DM-CBGs 235/87/153. On Metformin 1000 mg bid, Glipizide 10 mg bid, Insulin but will increase for better glucose control. Pre op HGA1C 9. Will need close medical follow up after discharge. 6. TSH 0.561 and magnesium 1.6. Will supplement Mg 7. Sternal drainage-no cellulitis. Likely fat necrosis. No need for antibiotic at this time.  Donielle M ZimmermanPA-C 03/01/2017,9:11 AM  Right pacing wire removed in entirety without difficulty Some serous drainage lower sternal wound, does not appear infected Likely fat necrosis will apply incisional wound vac I have seen and examined Theresa Ray and agree with the above assessment  and plan.  Grace Isaac MD Beeper [phone] Office [phone] 03/01/2017 1:44 PM

## 2017-03-01 NOTE — Progress Notes (Signed)
CARDIAC REHAB PHASE I   PRE:  Rate/Rhythm: 94 SR  BP:  Supine:    Sitting: 143/67  Standing:    SaO2: 93% RA  MODE:  Ambulation: 340 ft   POST:  Rate/Rhythm: 101 ST  BP:  Supine:   Sitting: 158/85  Standing:    SaO2: 93% RA Tolerated ambulation well without difficulty with rolling walker and assistance x 1.  Education completed re: sternal precautions, signs of infection, exercise progression, activity restrictions, referred to phase II cardiac rehab in [place].   [id number] Liliane Channel RN, BSN 03/01/2017 12:14 PM

## 2017-03-01 NOTE — Progress Notes (Signed)
Difficulty pulling pacing wire on pt's right side, assessed by three RNs.  Josie Saunders, PA notified, advised to roll and tape wires and she will evaluate in the morning.

## 2017-03-01 NOTE — Progress Notes (Signed)
Subjective:  No complaints this time still sore.  No complaints of shortness of breath or chest pain.  Objective:  Vital Signs in the last 24 hours: BP (!) 143/77 (BP Location: Right Arm)   Pulse 97   Temp 98.3 F (36.8 C) (Oral)   Resp 18   Ht 5' (1.524 m)   Wt 92.8 kg (204 lb 9.4 oz)   LMP  (LMP Unknown)   SpO2 95%   BMI 39.96 kg/m   Physical Exam: Obese female in no distress Lungs:  Clear Cardiac:  Regular rhythm, normal S1 and S2, no S3 Extremities:  No edema present  Intake/Output from previous day: 11/30 0701 - 12/01 0700 In: 600.9 [I.V.:600.9] Out: -   Weight Filed Weights   02/27/17 0555 02/28/17 0240 03/01/17 0642  Weight: 96.7 kg (213 lb 1.6 oz) 94.8 kg (208 lb 14.4 oz) 92.8 kg (204 lb 9.4 oz)    Lab Results: Basic Metabolic Panel: Recent Labs    02/28/17 0235 03/01/17 0236  NA 134* 136  K 3.3* 4.1  CL 99* 101  CO2 24 24  GLUCOSE 175* 152*  BUN 10 8  CREATININE 0.80 0.74   CBC: Recent Labs    02/27/17 0449 02/28/17 0235  WBC 13.1* 13.2*  HGB 10.5* 11.8*  HCT 32.1* 34.7*  MCV 90.9 90.1  PLT 251 326   Telemetry: Normal  sinus rhythm  Assessment/Plan:  1.  Postoperative atrial fibrillation currently maintaining sinus rhythm on amiodarone 2.  Obesity 3.  Recent coronary bypass grafting 4.  Hypertension above goal  Recommendations:  Continue amiodarone and beta blockers at this time.  Agree with the base to help control blood      W. Doristine Church  MD [facility]  03/01/2017, 12:33 PM

## 2017-03-01 NOTE — Plan of Care (Signed)
  Activity: Capacity to carry out activities will improve 03/01/2017 0522 - Progressing by Peggye Pitt, RN   Activity: Risk for activity intolerance will decrease 03/01/2017 0522 - Progressing by Peggye Pitt, RN

## 2017-03-01 NOTE — Progress Notes (Signed)
Patient VS B/P 147/81, Heart Rate 76 SR, O2 96 RA, Removed pacing wires, patient left set of wires were pulled and intact. Patient right side of wires would not come out. Patient tolerated removal of left sided wires well. Patient VS after removal of left sided pacing wires B/P 131/99, Heart rate 74 SR, O2 96% RA.

## 2017-03-01 NOTE — Progress Notes (Signed)
Prevena Vac 125  applied to sternal wound.

## 2017-03-02 LAB — GLUCOSE, CAPILLARY
GLUCOSE-CAPILLARY: 117 mg/dL — AB (ref 65–99)
GLUCOSE-CAPILLARY: 170 mg/dL — AB (ref 65–99)
Glucose-Capillary: 123 mg/dL — ABNORMAL HIGH (ref 65–99)
Glucose-Capillary: 106 mg/dL — ABNORMAL HIGH (ref 65–99)

## 2017-03-02 LAB — BASIC METABOLIC PANEL
Anion gap: 11 (ref 5–15)
BUN: 15 mg/dL (ref 6–20)
CO2: 24 mmol/L (ref 22–32)
Calcium: 9.2 mg/dL (ref 8.9–10.3)
Chloride: 100 mmol/L — ABNORMAL LOW (ref 101–111)
Creatinine, Ser: 1 mg/dL (ref 0.44–1.00)
GFR calc Af Amer: >60 mL/min (ref >60)
GFR calc non Af Amer: 57 mL/min — ABNORMAL LOW (ref >60)
Glucose, Bld: 102 mg/dL — ABNORMAL HIGH (ref 65–99)
Potassium: 3.9 mmol/L (ref 3.5–5.1)
Sodium: 135 mmol/L (ref 135–145)

## 2017-03-02 MED ORDER — POTASSIUM CHLORIDE CRYS ER 20 MEQ PO TBCR
20.0000 meq | EXTENDED_RELEASE_TABLET | Freq: Once | ORAL | Status: AC
  Administered 2017-03-02: 20 meq via ORAL
  Filled 2017-03-02: qty 1

## 2017-03-02 MED ORDER — POTASSIUM CHLORIDE CRYS ER 20 MEQ PO TBCR
40.0000 meq | EXTENDED_RELEASE_TABLET | Freq: Every day | ORAL | Status: DC
  Administered 2017-03-02 – 2017-03-03 (×2): 40 meq via ORAL
  Filled 2017-03-02: qty 2

## 2017-03-02 NOTE — Progress Notes (Addendum)
      [address]       [place] [address]             [phone]        6 Days Post-Op Procedure(s) (LRB): CORONARY ARTERY BYPASS GRAFTING (CABG) USING LEFT INTERNAL MAMMARY ARTERY TO LAD AND ENDOSCOPICALLY HARVESTED GREATER SAPHENOUS VEIN TO PDA AND TO OM1. (N/A) TRANSESOPHAGEAL ECHOCARDIOGRAM (TEE) (N/A) ENDOVEIN HARVEST OF GREATER SAPHENOUS VEIN (Right)  Subjective: Patient did not sleep much. She states someone is always waking her up.  Objective: Vital signs in last 24 hours: Temp:  [98.2 F (36.8 C)-99.2 F (37.3 C)] 98.2 F (36.8 C) (12/02 0430) Pulse Rate:  [85-92] 85 (12/02 0430) Cardiac Rhythm: Normal sinus rhythm (12/02 0700) Resp:  [12-16] 14 (12/02 0430) BP: (125-128)/(74-111) 128/82 (12/02 0430) SpO2:  [94 %-96 %] 94 % (12/02 0430) Weight:  [200 lb 6.4 oz (90.9 kg)] 200 lb 6.4 oz (90.9 kg) (12/02 0255)  Pre op weight 94.8 kg Current Weight  03/02/17 200 lb 6.4 oz (90.9 kg)    Intake/Output from previous day: No intake/output data recorded.   Physical Exam:  Cardiovascular: RRR Pulmonary: Slightly diminished at bases Abdomen: Soft, non tender, bowel sounds present. Extremities: Bilateral lower extremity edema. Wounds: RLE wound is clean and dry.  No erythema or signs of infection. Sternal wound has Pravena wound VAC in place.  Lab Results: CBC: Recent Labs    02/28/17 0235  WBC 13.2*  HGB 11.8*  HCT 34.7*  PLT 326   BMET:  Recent Labs    03/01/17 0236 03/02/17 0231  NA 136 135  K 4.1 3.9  CL 101 100*  CO2 24 24  GLUCOSE 152* 102*  BUN 8 15  CREATININE 0.74 1.00  CALCIUM 8.6* 9.2    PT/INR:  Lab Results  Component Value Date   INR 1.13 02/24/2017   INR 1.00 02/19/2017   INR 1.02 02/17/2017   ABG:  INR: Will add last result for INR, ABG once components are confirmed Will add last 4 CBG results once components are confirmed  Assessment/Plan: 1. CV-  A fib with RVR previosuly. Converted to SR and remains  in SR. On Amiodarone 200 mg bid, Amlodipine 10 mg daily and Lopressor 25 mg bid, and Lisinopril 5 mg daily. 2. Pulmonary-On room air. Encourage incentive spirometer. 3. Volume overload-On Lasix 40 mg daily  4. ABL anemia- H and H 11.8 and 34.7 5. DM-CBGs 96/107/123. On Metformin 1000 mg bid, Glipizide 10 mg bid and Insulin. Pre op HGA1C 9. Will need close medical follow up after discharge. 7. Sternal drainage-no cellulitis. Likely fat necrosis. Last WBC 13,200 but will re check in am.  No need for antibiotic at this time. Continue Pravena wound VAC 8. Supplement potassium  Donielle M ZimmermanPA-C 03/02/2017,8:31 AM  Patient improving  Incisional wound vac in place Poss home in am I have seen and examined Theresa Ray and agree with the above assessment  and plan.  Grace Isaac MD Beeper [phone] Office [phone] 03/02/2017 1:22 PM

## 2017-03-03 LAB — CBC
HEMATOCRIT: 38.1 % (ref 36.0–46.0)
HEMOGLOBIN: 12.7 g/dL (ref 12.0–15.0)
MCH: 30.2 pg (ref 26.0–34.0)
MCHC: 33.3 g/dL (ref 30.0–36.0)
MCV: 90.7 fL (ref 78.0–100.0)
Platelets: 455 10*3/uL — ABNORMAL HIGH (ref 150–400)
RBC: 4.2 MIL/uL (ref 3.87–5.11)
RDW: 13.4 % (ref 11.5–15.5)
WBC: 12.1 10*3/uL — AB (ref 4.0–10.5)

## 2017-03-03 LAB — BASIC METABOLIC PANEL
Anion gap: 11 (ref 5–15)
BUN: 15 mg/dL (ref 6–20)
CO2: 25 mmol/L (ref 22–32)
Calcium: 9.1 mg/dL (ref 8.9–10.3)
Chloride: 102 mmol/L (ref 101–111)
Creatinine, Ser: 0.94 mg/dL (ref 0.44–1.00)
GFR calc Af Amer: >60 mL/min (ref >60)
GFR calc non Af Amer: >60 mL/min (ref >60)
Glucose, Bld: 100 mg/dL — ABNORMAL HIGH (ref 65–99)
Potassium: 3.8 mmol/L (ref 3.5–5.1)
Sodium: 138 mmol/L (ref 135–145)

## 2017-03-03 LAB — GLUCOSE, CAPILLARY: Glucose-Capillary: 92 mg/dL (ref 65–99)

## 2017-03-03 MED ORDER — TRAMADOL HCL 50 MG PO TABS: 50.0000 mg | ORAL_TABLET | ORAL | 0 refills | Status: DC | PRN

## 2017-03-03 MED ORDER — LISINOPRIL 5 MG PO TABS: 5.0000 mg | ORAL_TABLET | Freq: Every day | ORAL | 3 refills | Status: DC

## 2017-03-03 MED ORDER — AMIODARONE HCL 200 MG PO TABS: 200.0000 mg | ORAL_TABLET | Freq: Two times a day (BID) | ORAL | 1 refills | Status: DC

## 2017-03-03 MED ORDER — ACETAMINOPHEN 325 MG PO TABS: 650.0000 mg | ORAL_TABLET | Freq: Four times a day (QID) | ORAL | Status: DC | PRN

## 2017-03-03 MED ORDER — ATORVASTATIN CALCIUM 80 MG PO TABS: 80.0000 mg | ORAL_TABLET | Freq: Every day | ORAL | 3 refills | Status: DC

## 2017-03-03 NOTE — Progress Notes (Addendum)
      [address]       [place] [address]             [phone]      7 Days Post-Op Procedure(s) (LRB): CORONARY ARTERY BYPASS GRAFTING (CABG) USING LEFT INTERNAL MAMMARY ARTERY TO LAD AND ENDOSCOPICALLY HARVESTED GREATER SAPHENOUS VEIN TO PDA AND TO OM1. (N/A) TRANSESOPHAGEAL ECHOCARDIOGRAM (TEE) (N/A) ENDOVEIN HARVEST OF GREATER SAPHENOUS VEIN (Right)   Subjective:  No new complaints.  Ready to go home.  + ambulation  + BM  Objective: Vital signs in last 24 hours: Temp:  [97.5 F (36.4 C)-98.9 F (37.2 C)] 97.5 F (36.4 C) (12/03 0736) Pulse Rate:  [75-81] 77 (12/03 0736) Cardiac Rhythm: Normal sinus rhythm (12/03 0700) Resp:  [14-18] 17 (12/03 0407) BP: (117-139)/(68-75) 117/70 (12/03 0736) SpO2:  [94 %-97 %] 97 % (12/03 0736) Weight:  [204 lb 9.4 oz (92.8 kg)] 204 lb 9.4 oz (92.8 kg) (12/03 0407)  Intake/Output from previous day: 12/02 0701 - 12/03 0700 In: 360 [P.O.:360] Out: -   General appearance: alert, cooperative and no distress Heart: regular rate and rhythm Lungs: clear to auscultation bilaterally Abdomen: soft, non-tender; bowel sounds normal; no masses,  no organomegaly Extremities: edema trace Wound: clean and dry  Lab Results: No results for input(s): WBC, HGB, HCT, PLT in the last 72 hours. BMET:  Recent Labs    03/01/17 0236 03/02/17 0231  NA 136 135  K 4.1 3.9  CL 101 100*  CO2 24 24  GLUCOSE 152* 102*  BUN 8 15  CREATININE 0.74 1.00  CALCIUM 8.6* 9.2    PT/INR: No results for input(s): LABPROT, INR in the last 72 hours. ABG    Component Value Date/Time   PHART 7.369 02/24/2017 2242   HCO3 21.3 02/24/2017 2242   TCO2 22 02/25/2017 1656   ACIDBASEDEF 3.0 (H) 02/24/2017 2242   O2SAT 95.0 02/24/2017 2242   CBG (last 3)  Recent Labs    03/02/17 1636 03/02/17 2100 03/03/17 0558  GLUCAP 117* 170* 92    Assessment/Plan: S/P Procedure(s) (LRB): CORONARY ARTERY BYPASS GRAFTING (CABG) USING LEFT INTERNAL  MAMMARY ARTERY TO LAD AND ENDOSCOPICALLY HARVESTED GREATER SAPHENOUS VEIN TO PDA AND TO OM1. (N/A) TRANSESOPHAGEAL ECHOCARDIOGRAM (TEE) (N/A) ENDOVEIN HARVEST OF GREATER SAPHENOUS VEIN (Right)  1. CV- A. Fib, maintaining NSR- continue Amiodarone, Lopressor, Norvasc, and Lisinopril 2. Pulm- no acute issues, continue IS 3. Renal- creatinine has been stable, weight is trending down, continue Lasix 4. DM- cbgs controlled, elevated A1c preop, will require close follow up with PCP 5. ID- afebrile, mild leukocytosis, sternal wound looks clean, no signs of infection 6. Dispo- patient stable, will d/c home today    LOS: 15 days    Ellwood Handler 03/03/2017

## 2017-03-03 NOTE — Care Management Note (Addendum)
Case Management Note Marvetta Gibbons RN, BSN Unit 4E-Case Manager- [place] coverage [phone]  Patient Details  Name: Theresa Ray MRN: [id number] Date of Birth: 1945/07/09  Subjective/Objective:  Pt admitted with chest pain, cardiac cath showed MVD- s/p CABG on 11/26                  Action/Plan: PTA pt lived at home, - CM to follow for d/c needs   Expected Discharge Date:  03/03/17               Expected Discharge Plan:  Home/Self Care  In-House Referral:  NA  Discharge planning Services  CM Consult  Post Acute Care Choice:  NA Choice offered to:  NA  DME Arranged:    DME Agency:     HH Arranged:    [place] Agency:     Status of Service:  Completed, signed off  If discussed at [place] of Stay Meetings, dates discussed:    Discharge Disposition: home/self care   Additional Comments:  03/03/17- 1055- Aubriana Ravelo RN, CM- pt for d/c home today with spouse- no CM needs noted for discharge.   Dawayne Patricia, RN 03/03/2017, 10:54 AM

## 2017-03-03 NOTE — Progress Notes (Signed)
Patient in a stable condition, discharge education reviewed with patient and her husband at bedside, they verbalized understanding, patient stated that Creta Levin MD told her to stop taking Celebrex'' its not good for my heart, Erin PA notified and she said it was fine for patient to be off celebrex.  IV removed, tele dc ccmd notified, patient belongings at bedside, patient's husband to transport patient home.

## 2017-03-03 NOTE — Progress Notes (Signed)
Physical Therapy Treatment Patient Details Name: Theresa Ray MRN: [id number] DOB: 10/05/1945 Today's Date: 03/03/2017    History of Present Illness Pt is a 71 y.o. female admitted 02/16/17 with chest pain and troponin is elevated c/w NSTEMI. Now s/p CABGx3 on 02/24/17. Pertinent PMH includes HTN, DM, TIA.    PT Comments    Patient is making progress toward mobility goals. Pt required min guard assist overall for bed mobility, functional transfers, and ambulation. Pt was able to verbalize understanding of sternal precautions however needs cues to maintain them for all functional transfers. Vitals WNL.  Current plan remains appropriate.   Follow Up Recommendations  Home health PT;Supervision - Intermittent     Equipment Recommendations  Other (comment);None recommended by PT(pt reported having RW at home)    Recommendations for Other Services OT consult     Precautions / Restrictions Precautions Precautions: Sternal Precaution Comments: precautions reviewed with pt beginning of session; pt able to verbalize but does require cues to adhere to precautions Restrictions Weight Bearing Restrictions: Yes(sternal precautions) Other Position/Activity Restrictions: Sternal    Mobility  Bed Mobility Overal bed mobility: Needs Assistance Bed Mobility: Rolling;Sidelying to Sit Rolling: Min guard Sidelying to sit: Min guard       General bed mobility comments: cues for technique and maintaining sternal precautions  Transfers Overall transfer level: Needs assistance Equipment used: Rolling walker (2 wheeled) Transfers: Sit to/from Stand Sit to Stand: Min guard         General transfer comment: cues for hand placement and technique; pt used cardiac pillow; min guard for safety  Ambulation/Gait Ambulation/Gait assistance: Min guard Ambulation Distance (Feet): 300 Feet Assistive device: Rolling walker (2 wheeled) Gait Pattern/deviations: Step-through pattern;Decreased stride  length Gait velocity: Decreased   General Gait Details: cues for safe use of AD and increased stride length   Stairs            Wheelchair Mobility    Modified Rankin (Stroke Patients Only)       Balance Overall balance assessment: Needs assistance   Sitting balance-Leahy Scale: Good       Standing balance-Leahy Scale: Fair                              Cognition Arousal/Alertness: Awake/alert Behavior During Therapy: Flat affect Overall Cognitive Status: Within Functional Limits for tasks assessed                                        Exercises      General Comments General comments (skin integrity, edema, etc.): vitals WNL      Pertinent Vitals/Pain Pain Assessment: Faces Faces Pain Scale: Hurts a little bit Pain Location: Sternal incision Pain Descriptors / Indicators: Guarding;Discomfort Pain Intervention(s): Monitored during session;Repositioned    Home Living                      Prior Function            PT Goals (current goals can now be found in the care plan section) Acute Rehab PT Goals Patient Stated Goal: Return home PT Goal Formulation: With patient Time For Goal Achievement: 03/14/17 Potential to Achieve Goals: Good Progress towards PT goals: Progressing toward goals    Frequency    Min 3X/week      PT Plan Current plan remains  appropriate    Co-evaluation              AM-PAC PT "6 Clicks" Daily Activity  Outcome Measure  Difficulty turning over in bed (including adjusting bedclothes, sheets and blankets)?: A Little Difficulty moving from lying on back to sitting on the side of the bed? : Unable Difficulty sitting down on and standing up from a chair with arms (e.g., wheelchair, bedside commode, etc,.)?: Unable Help needed moving to and from a bed to chair (including a wheelchair)?: A Little Help needed walking in hospital room?: A Little Help needed climbing 3-5 steps with a  railing? : A Little 6 Click Score: 14    End of Session Equipment Utilized During Treatment: Gait belt Activity Tolerance: Patient tolerated treatment well Patient left: in chair;with call bell/phone within reach Nurse Communication: Mobility status PT Visit Diagnosis: Other abnormalities of gait and mobility (R26.89)     Time: 0935-1001 PT Time Calculation (min) (ACUTE ONLY): 26 min  Charges:  $Gait Training: 8-22 mins $Therapeutic Activity: 8-22 mins                    G Codes:       Earney Navy, PTA Pager: [phone]     Darliss Cheney 03/03/2017, 10:59 AM

## 2017-03-11 DIAGNOSIS — R69 Illness, unspecified: Secondary | ICD-10-CM | POA: Diagnosis not present

## 2017-03-12 ENCOUNTER — Encounter: Payer: Self-pay | Admitting: Physician Assistant

## 2017-03-17 DIAGNOSIS — N390 Urinary tract infection, site not specified: Secondary | ICD-10-CM | POA: Diagnosis not present

## 2017-03-18 ENCOUNTER — Ambulatory Visit: Payer: Medicare HMO | Admitting: Physician Assistant

## 2017-03-18 ENCOUNTER — Encounter: Payer: Self-pay | Admitting: Physician Assistant

## 2017-03-18 VITALS — BP 116/60 | HR 72 | Ht 63.0 in | Wt 203.8 lb

## 2017-03-18 DIAGNOSIS — E1140 Type 2 diabetes mellitus with diabetic neuropathy, unspecified: Secondary | ICD-10-CM | POA: Diagnosis not present

## 2017-03-18 DIAGNOSIS — E785 Hyperlipidemia, unspecified: Secondary | ICD-10-CM | POA: Diagnosis not present

## 2017-03-18 DIAGNOSIS — R42 Dizziness and giddiness: Secondary | ICD-10-CM | POA: Diagnosis not present

## 2017-03-18 DIAGNOSIS — Z951 Presence of aortocoronary bypass graft: Secondary | ICD-10-CM | POA: Diagnosis not present

## 2017-03-18 DIAGNOSIS — I10 Essential (primary) hypertension: Secondary | ICD-10-CM

## 2017-03-18 DIAGNOSIS — Z720 Tobacco use: Secondary | ICD-10-CM | POA: Insufficient documentation

## 2017-03-18 LAB — BASIC METABOLIC PANEL
BUN / CREAT RATIO: 13 (ref 12–28)
BUN: 15 mg/dL (ref 8–27)
CALCIUM: 9.8 mg/dL (ref 8.7–10.3)
CHLORIDE: 98 mmol/L (ref 96–106)
CO2: 22 mmol/L (ref 20–29)
CREATININE: 1.15 mg/dL — AB (ref 0.57–1.00)
GFR, EST AFRICAN AMERICAN: 56 mL/min/{1.73_m2} — AB (ref >59)
GFR, EST NON AFRICAN AMERICAN: 49 mL/min/{1.73_m2} — AB (ref >59)
Glucose: 155 mg/dL — ABNORMAL HIGH (ref 65–99)
Potassium: 4.6 mmol/L (ref 3.5–5.2)
Sodium: 139 mmol/L (ref 134–144)

## 2017-03-18 NOTE — Progress Notes (Signed)
Cardiology Office Note    Date:  03/18/2017   ID:  Theresa Ray, DOB 15-Dec-1945, MRN [id number]  PCP:  Ronita Hipps, MD  Cardiologist: Dr. Angelena Form  Chief Complaint  Patient presents with  . Follow-up    History of Present Illness:  Theresa Ray is a 71 y.o. female with history of DM, HTN admitted with  a NSTEMI. 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 LV systolic function is normal. s/p CABG  x3 on 02/24/17 with a LIMA to the LAD, SVG to the PDA, SVG to OM1.  He had postop atrial fibrillation converted to normal sinus rhythm on IV amiodarone.  Chads vas score is at least 6.  Also had diastolic CHF.  Also has hypertension, diabetes mellitus with diabetic neuropathy, HLD, and history of TIA.  Patient comes in today accompanied by a family member.  Overall she is doing well.  She starts cardiac rehab today.  She did get up one night to make some hot chocolate and felt dizzy and started to fall down because her knees gave out.  Her son was there and could not get her up so they called 911.  She did not have anything to eat the whole day and did take her diabetes medication.  She lost her machine so has not been checking her sugars.  911 did not check her sugar or blood pressure according to her family member.  She is getting a new machine today.  Had no further dizziness or presyncope.  Also has UTI that is being treated by primary care.     Past Medical History:  Diagnosis Date  . Diabetes mellitus without complication ([place])   . History of kidney stones    history of stones  . Hypertension   . Hypothyroidism   . NSTEMI (non-ST elevated myocardial infarction) ([place])   . TIA (transient ischemic attack)     Past Surgical History:  Procedure Laterality Date  . ABDOMINAL HYSTERECTOMY    . CORONARY ARTERY BYPASS GRAFT N/A 02/24/2017   Procedure: CORONARY ARTERY BYPASS GRAFTING (CABG) USING LEFT INTERNAL MAMMARY ARTERY TO LAD AND ENDOSCOPICALLY HARVESTED GREATER  SAPHENOUS VEIN TO PDA AND TO OM1.;  Surgeon: Grace Isaac, MD;  Location: [facility];  Service: Open Heart Surgery;  Laterality: N/A;  . ENDOVEIN HARVEST OF GREATER SAPHENOUS VEIN Right 02/24/2017   Procedure: ENDOVEIN HARVEST OF GREATER SAPHENOUS VEIN;  Surgeon: Grace Isaac, MD;  Location: [facility];  Service: Open Heart Surgery;  Laterality: Right;  . LAPAROSCOPIC GASTRIC BANDING    . LEFT HEART CATH AND CORONARY ANGIOGRAPHY N/A 02/18/2017   Procedure: LEFT HEART CATH AND CORONARY ANGIOGRAPHY;  Surgeon: Leonie Man, MD;  Location: [facility];  Service: Cardiovascular;  Laterality: N/A;  . TEE WITHOUT CARDIOVERSION N/A 02/24/2017   Procedure: TRANSESOPHAGEAL ECHOCARDIOGRAM (TEE);  Surgeon: Grace Isaac, MD;  Location: [facility];  Service: Open Heart Surgery;  Laterality: N/A;    Current Medications: Current Meds  Medication Sig  . acetaminophen (TYLENOL) 325 MG tablet Take 2 tablets (650 mg total) by mouth every 6 (six) hours as needed for mild pain.  Marland Kitchen amiodarone (PACERONE) 200 MG tablet Take 1 tablet (200 mg total) by mouth every 12 (twelve) hours. Then on 12/8 decrease to 200 mg daily  . amLODipine (NORVASC) 10 MG tablet Take 10 mg daily by mouth.  Marland Kitchen aspirin EC 325 MG tablet Take 325 mg daily by mouth.  Marland Kitchen atorvastatin (LIPITOR) 80  MG tablet Take 1 tablet (80 mg total) by mouth daily at 6 PM.  . carvedilol (COREG) 25 MG tablet Take 25 mg 2 (two) times daily by mouth.  . cholecalciferol (VITAMIN D) 1000 units tablet Take 3,000 Units 2 (two) times daily by mouth.  . co-enzyme Q-10 30 MG capsule Take 30 mg daily by mouth.  . furosemide (LASIX) 40 MG tablet Take 40 mg daily by mouth.  . gabapentin (NEURONTIN) 100 MG capsule Take 100 mg 3 (three) times daily by mouth.  Marland Kitchen glipiZIDE (GLUCOTROL) 10 MG tablet Take 10 mg 2 (two) times daily by mouth.  . Glucosamine-Chondroit-Vit C-Mn (GLUCOSAMINE CHONDR 500 COMPLEX PO) Take 1 tablet 2 (two) times daily by mouth.  Javier Docker Oil 1000  MG CAPS Take 1,000 mg daily by mouth.  . levothyroxine (SYNTHROID, LEVOTHROID) 175 MCG tablet Take 175 mcg daily before breakfast by mouth.  Marland Kitchen lisinopril (PRINIVIL,ZESTRIL) 5 MG tablet Take 1 tablet (5 mg total) by mouth daily.  Marland Kitchen MELATONIN PO Take 10 mg at bedtime by mouth.  . metFORMIN (GLUCOPHAGE) 1000 MG tablet Take 1,000 mg 2 (two) times daily by mouth.  . Multiple Vitamin (MULTIVITAMIN WITH MINERALS) TABS tablet Take 1 tablet daily by mouth.  . nitrofurantoin, macrocrystal-monohydrate, (MACROBID) 100 MG capsule Take 100 mg by mouth 2 (two) times daily.   . potassium gluconate (EQL POTASSIUM GLUCONATE) 595 (99 K) MG TABS tablet Take 1,785 mg 2 (two) times daily by mouth.  . sitaGLIPtin (JANUVIA) 50 MG tablet Take 50 mg daily by mouth.  . traMADol (ULTRAM) 50 MG tablet Take 1-2 tablets (50-100 mg total) by mouth every 4 (four) hours as needed for moderate pain.     Allergies:   Shellfish allergy and Aggrenox [aspirin-dipyridamole er]   Social History   Socioeconomic History  . Marital status: Single    Spouse name: None  . Number of children: None  . Years of education: None  . Highest education level: None  Social Needs  . Financial resource strain: None  . Food insecurity - worry: None  . Food insecurity - inability: None  . Transportation needs - medical: None  . Transportation needs - non-medical: None  Occupational History  . None  Tobacco Use  . Smoking status: Former Smoker    Types: Cigarettes    Start date: 1967    Last attempt to quit: 1975    Years since quitting: 43.9  . Smokeless tobacco: Never Used  Substance and Sexual Activity  . Alcohol use: No    Frequency: Never  . Drug use: No  . Sexual activity: None  Other Topics Concern  . None  Social History Narrative  . None     Family History:  The patient's   family history includes CVA in her father; Heart disease (age of onset: 66) in her mother.   ROS:   Please see the history of present illness.     Review of Systems  Constitution: Positive for weakness.  HENT: Negative.   Eyes: Negative.   Cardiovascular: Negative.   Respiratory: Negative.   Hematologic/Lymphatic: Negative.   Musculoskeletal: Positive for muscle weakness. Negative for joint pain.  Gastrointestinal: Negative.   Genitourinary: Negative.   Neurological: Positive for dizziness.   All other systems reviewed and are negative.   PHYSICAL EXAM:   VS:  BP 116/60   Pulse 72   Ht 5\' 3"  (1.6 m)   Wt 203 lb 12.8 oz (92.4 kg)   LMP  (LMP Unknown)  SpO2 98%   BMI 36.10 kg/m   Physical Exam  GEN: Obese, in no acute distress  Neck: no JVD, carotid bruits, or masses Cardiac:RRR; no murmurs, rubs, or gallops  Respiratory:  clear to auscultation bilaterally, normal work of breathing GI: soft, nontender, nondistended, + BS Ext: without cyanosis, clubbing, or edema, Good distal pulses bilaterally Neuro:  Alert and Oriented x 3 Psych: euthymic mood, full affect  Wt Readings from Last 3 Encounters:  03/18/17 203 lb 12.8 oz (92.4 kg)  03/03/17 204 lb 9.4 oz (92.8 kg)      Studies/Labs Reviewed:   EKG:  EKG is  ordered today.  The ekg ordered today demonstrates normal sinus rhythm with PACs nonspecific ST-T wave changes, no acute change  Recent Labs: 02/19/2017: ALT 20 03/01/2017: Magnesium 1.6; TSH 0.561 03/03/2017: BUN 15; Creatinine, Ser 0.94; Hemoglobin 12.7; Platelets 455; Potassium 3.8; Sodium 138   Lipid Panel    Component Value Date/Time   CHOL 125 02/17/2017 0042   TRIG 358 (H) 02/17/2017 0042   HDL 30 (L) 02/17/2017 0042   CHOLHDL 4.2 02/17/2017 0042   VLDL 72 (H) 02/17/2017 0042   LDLCALC 23 02/17/2017 0042    Additional studies/ records that were reviewed today include:   Echo 02/17/17: - Left ventricle: The cavity size was normal. Systolic function was   normal. The estimated ejection fraction was in the range of 55%   to 60%. Probable hypokinesis of the basalinferior and   inferoseptal  myocardium; consistent with ischemia in the   distribution of the right coronary artery. Features are   consistent with a pseudonormal left ventricular filling pattern,   with concomitant abnormal relaxation and increased filling   pressure (grade 2 diastolic dysfunction). - Mitral valve: There was mild regurgitation.   Cardiac cath 02/18/17:  Prox LAD lesion is 70% stenosed -at Diag 1. Dist LAD lesion is 99% stenosed.  Ost 1st Diag lesion is 85% stenosed. Very small caliber vessel  Ramus Distal lesion is 100% stenosed.  Prox RCA lesion is 75% stenosed. Mid RCA to Dist RCA lesion is 100% stenosed.--The R PDA and PL systems fill via left-to-right collaterals with brisk flow.  Prox Cx lesion is 50% stenosed -proximal to OM1  Ost 1st Mrg lesion is 100% stenosed. Ostial occlusion. The vessel fills retrograde via left to left collaterals  Dist Cx lesion is 75% stenosed -in the band prior to small left posterolateral branch  There is mild left ventricular systolic dysfunction. The left ventricular ejection fraction is 50-55% by visual estimate.  LV end diastolic pressure is moderately elevated.     ASSESSMENT:    1. S/P CABG x 3   2. Essential hypertension   3. Hyperlipidemia, unspecified hyperlipidemia type   4. Type 2 diabetes mellitus with diabetic neuropathy, without long-term current use of insulin (HCC)      PLAN:  In order of problems listed above:  CAD status post NSTEMI and CABG times 3 02/24/17.  She had postop atrial fibrillation converted to normal sinus rhythm with IV amiodarone.  Maintaining normal sinus rhythm.  Probably stop amiodarone in a couple months.  Normal LV function with grade 2 DD on 2D echo.  Essential hypertension blood pressure well controlled.  Hyperlipidemia on Lipitor 80 mg daily.  Type 2 diabetes mellitus with diabetic neuropathy on long-term insulin followed by primary care-has not been checking sugars but is getting a new machine  today.  Dizziness with presyncope and fall at home in the setting of not  eating all day and taking her diabetes medication.  Difficult to know if she was hypotensive or was due to her sugars.  She has had no recurrence.  Vital signs are stable today.  Asked her to call us if it happens again.  Needs tight control of her diabetes.  Follow-up with me in 1 month and Dr. Angelena Form in 3 months.  Check renal function today.      Medication Adjustments/Labs and Tests Ordered: Current medicines are reviewed at length with the patient today.  Concerns regarding medicines are outlined above.  Medication changes, Labs and Tests ordered today are listed in the Patient Instructions below. There are no Patient Instructions on file for this visit.   Sumner Boast, PA-C  03/18/2017 9:12 AM    [facility] [address], [place], [place]  [address] Phone: [phone]; Fax: [phone]

## 2017-03-18 NOTE — Patient Instructions (Signed)
Medication Instructions:  Your physician recommends that you continue on your current medications as directed. Please refer to the Current Medication list given to you today.   Labwork: Today: BMET  Testing/Procedures: None ordered  Follow-Up: Your physician recommends that you schedule a follow-up appointment on January 28,2019 @ 10:00 am with Ermalinda Barrios PA-C   Your physician recommends that you schedule a follow-up appointment on March 25,2019 @ 2:40 pm with Dr. Angelena Form    Any Other Special Instructions Will Be Listed Below (If Applicable).  Call our office if you have anymore dizziness [phone]  Keep a close eye on your diabetes. Follow up with your primary care doctor     If you need a refill on your cardiac medications before your next appointment, please call your pharmacy.

## 2017-03-20 ENCOUNTER — Telehealth: Payer: Self-pay | Admitting: Cardiovascular Disease

## 2017-03-20 DIAGNOSIS — Z951 Presence of aortocoronary bypass graft: Secondary | ICD-10-CM | POA: Diagnosis not present

## 2017-03-20 DIAGNOSIS — Z7982 Long term (current) use of aspirin: Secondary | ICD-10-CM | POA: Diagnosis not present

## 2017-03-20 DIAGNOSIS — Z79899 Other long term (current) drug therapy: Secondary | ICD-10-CM | POA: Diagnosis not present

## 2017-03-20 DIAGNOSIS — E039 Hypothyroidism, unspecified: Secondary | ICD-10-CM | POA: Diagnosis not present

## 2017-03-20 DIAGNOSIS — I252 Old myocardial infarction: Secondary | ICD-10-CM | POA: Diagnosis not present

## 2017-03-20 DIAGNOSIS — R69 Illness, unspecified: Secondary | ICD-10-CM | POA: Diagnosis not present

## 2017-03-20 DIAGNOSIS — I10 Essential (primary) hypertension: Secondary | ICD-10-CM | POA: Diagnosis not present

## 2017-03-20 DIAGNOSIS — E785 Hyperlipidemia, unspecified: Secondary | ICD-10-CM | POA: Diagnosis not present

## 2017-03-20 DIAGNOSIS — E119 Type 2 diabetes mellitus without complications: Secondary | ICD-10-CM | POA: Diagnosis not present

## 2017-03-20 NOTE — Telephone Encounter (Signed)
°  New Prob  Has abnormal test response for admission which is concern for hypotension. Would like to speak to RN. Please call.

## 2017-03-20 NOTE — Telephone Encounter (Signed)
Reviewed with Dr. Curt Bears and no changes needed at this time but pt should be scheduled for office visit. I spoke with pt. She is feeling fine at this time.  She is aware not to return to rehab until evaluated in our office. Appt made for her to see Melina Copa, PA on 03/28/17 at 3:30. I told pt to call our office or go to ED if problems prior to this appointment

## 2017-03-20 NOTE — Telephone Encounter (Signed)
I spoke with Judeen Hammans at [facility]. Pt was there for first rehab appointment today.  Initial vitals--sitting 106/56,76                     Standing 100/60,73 Admission guidelines require 6 min walk. During walk pt complained of fatigue and became pale, sweaty and hypotensive. BP was 84/52 and then 85/64.  Heart rate 76.  Pt was given 12 ounces of gatorade.  Pt began to feel better and prior to leaving rehab BP was 105/68. Blood sugar was not checked during this episode.  Judeen Hammans also reports pt had syncopal episode at home. This is noted in office visit note from 12/18. Pt was to return to cardiac rehab tomorrow but Judeen Hammans has instructed pt not to return tomorrow.  Next planned rehab day is 12/26. I told Judeen Hammans pt should stay out of rehab next week also.  Pt will need letter from MD clearing her to return to cardiac rehab.  I placed call to pt to follow up but there was no answer and no voicemail.  Sherry requests we fax last office note to her at [phone]. Will fax note.

## 2017-03-21 DIAGNOSIS — R69 Illness, unspecified: Secondary | ICD-10-CM | POA: Diagnosis not present

## 2017-03-27 NOTE — Telephone Encounter (Signed)
Agree. Thanks

## 2017-03-28 ENCOUNTER — Encounter: Payer: Self-pay | Admitting: Physician Assistant

## 2017-03-28 ENCOUNTER — Ambulatory Visit (INDEPENDENT_AMBULATORY_CARE_PROVIDER_SITE_OTHER): Payer: Medicare HMO

## 2017-03-28 ENCOUNTER — Ambulatory Visit: Payer: Medicare HMO | Admitting: Physician Assistant

## 2017-03-28 VITALS — Ht 63.0 in | Wt 190.0 lb

## 2017-03-28 DIAGNOSIS — R55 Syncope and collapse: Secondary | ICD-10-CM

## 2017-03-28 DIAGNOSIS — I2510 Atherosclerotic heart disease of native coronary artery without angina pectoris: Secondary | ICD-10-CM

## 2017-03-28 DIAGNOSIS — I4891 Unspecified atrial fibrillation: Secondary | ICD-10-CM | POA: Diagnosis not present

## 2017-03-28 DIAGNOSIS — I959 Hypotension, unspecified: Secondary | ICD-10-CM

## 2017-03-28 DIAGNOSIS — I9789 Other postprocedural complications and disorders of the circulatory system, not elsewhere classified: Secondary | ICD-10-CM | POA: Diagnosis not present

## 2017-03-28 DIAGNOSIS — I5032 Chronic diastolic (congestive) heart failure: Secondary | ICD-10-CM

## 2017-03-28 DIAGNOSIS — I951 Orthostatic hypotension: Secondary | ICD-10-CM | POA: Insufficient documentation

## 2017-03-28 MED ORDER — FUROSEMIDE 40 MG PO TABS: 20.0000 mg | ORAL_TABLET | Freq: Every day | ORAL | 3 refills | Status: DC

## 2017-03-28 NOTE — Patient Instructions (Signed)
Medication Instructions:  Your physician has recommended you make the following change in your medication:  1.  DECREASE the Lasix to 40 mg taking 1/2 tablet daily   Labwork: TODAY:  BMET, MAG, & CBC  Testing/Procedures: Your physician has recommended that you wear an event monitor AS SOON AS POSSIBLE. Marland Kitchen Event monitors are medical devices that record the heart's electrical activity. Doctors most often [place] these monitors to diagnose arrhythmias. Arrhythmias are problems with the speed or rhythm of the heartbeat. The monitor is a small, portable device. You can wear one while you do your normal daily activities. This is usually used to diagnose what is causing palpitations/syncope (passing out).    Follow-Up: Your physician recommends that you schedule a follow-up appointment in: [place], PA-C AFTER THE MONITOR IS PUT ON    Any Other Special Instructions Will Be Listed Below (If Applicable).  Cardiac Event Monitoring A cardiac event monitor is a small recording device that is used to detect abnormal heart rhythms (arrhythmias). The monitor is used to record your heart rhythm when you have symptoms, such as:  Fast heartbeats (palpitations), such as heart racing or fluttering.  Dizziness.  Fainting or light-headedness.  Unexplained weakness.  Some monitors are wired to electrodes placed on your chest. Electrodes are flat, sticky disks that attach to your skin. Other monitors may be hand-held or worn on the wrist. The monitor can be worn for up to 30 days. If the monitor is attached to your chest, a technician will prepare your chest for the electrode placement and show you how to work the monitor. Take time to practice using the monitor before you leave the office. Make sure you understand how to send the information from the monitor to your health care provider. In some cases, you may need to use a landline telephone instead of a cell phone. What are the  risks? Generally, this device is safe to use, but it possible that the skin under the electrodes will become irritated. How to use your cardiac event monitor  Wear your monitor at all times, except when you are in water: ? Do not let the monitor get wet. ? Take the monitor off when you bathe. Do not swim or use a hot tub with it on.  Keep your skin clean. Do not put body lotion or moisturizer on your chest.  Change the electrodes as told by your health care provider or any time they stop sticking to your skin. You may need to use medical tape to keep them on.  Try to put the electrodes in slightly different places on your chest to help prevent skin irritation. They must remain in the area under your left breast and in the upper right section of your chest.  Make sure the monitor is safely clipped to your clothing or in a location close to your body that your health care provider recommends.  Press the button to record as soon as you feel heart-related symptoms, such as: ? Dizziness. ? Weakness. ? Light-headedness. ? Palpitations. ? Thumping or pounding in your chest. ? Shortness of breath. ? Unexplained weakness.  Keep a diary of your activities, such as walking, doing chores, and taking medicine. It is very important to note what you were doing when you pushed the button to record your symptoms. This will help your health care provider determine what might be contributing to your symptoms.  Send the recorded information as recommended by your health care provider. It  may take some time for your health care provider to process the results.  Change the batteries as told by your health care provider.  Keep electronic devices away from your monitor. This includes: ? Tablets. ? MP3 players. ? Cell phones.  While wearing your monitor you should avoid: ? Electric blankets. ? Armed forces operational officer. ? Electric toothbrushes. ? Microwave ovens. ? Magnets. ? Metal detectors. Get help right  away if:  You have chest pain.  You have extreme difficulty breathing or shortness of breath.  You develop a very fast heartbeat that persists.  You develop dizziness that does not go away.  You faint or constantly feel like you are about to faint. Summary  A cardiac event monitor is a small recording device that is used to help detect abnormal heart rhythms (arrhythmias).  The monitor is used to record your heart rhythm when you have heart-related symptoms.  Make sure you understand how to send the information from the monitor to your health care provider.  It is important to press the button on the monitor when you have any heart-related symptoms.  Keep a diary of your activities, such as walking, doing chores, and taking medicine. It is very important to note what you were doing when you pushed the button to record your symptoms. This will help your health care provider learn what might be causing your symptoms. This information is not intended to replace advice given to you by your health care provider. Make sure you discuss any questions you have with your health care provider. Document Released: 12/26/2007 Document Revised: 03/02/2016 Document Reviewed: 03/02/2016 Elsevier Interactive Patient Education  2017 [employer].     If you need a refill on your cardiac medications before your next appointment, please call your pharmacy.

## 2017-03-28 NOTE — Progress Notes (Signed)
Cardiology Office Note    Date:  03/28/2017  ID:  Theresa Ray, DOB [birth year], MRN [id number] PCP:  Ronita Hipps, MD  Cardiologist:  Dr. Angelena Form   Chief Complaint: f/u low blood pressure  History of Present Illness:  Theresa Ray is a 71 y.o. female with history of CAD (recent NSTEMI 01/2017 s/p CABGx3 with post-op AF), DM, HTN, chronic diastolic CHF, diabetic neuropathy, TIA, chronic edema who presents for follow-up. She recently was admitted with NSTEMI (peak troponin 1.19). 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 2D Echo 02/17/17 showed EF 55-60%, probable HK of the basal inferior and inferoseptal myocardium, grade 2 DD, mild MR. She underwent CABG x3 on 02/24/17 with a LIMA to the LAD, SVG to the PDA, SVG to OM1. TEE showed residual mild MR post CPB.Marland Kitchen She had postop atrial fibrillation & converted to normal sinus rhythm on IV amiodarone. She was discharged on oral amiodarone. Cardiology team saw patient and recommended to consider anticoagulation as CHADSVASC was 6. Do not see commentary on this from [facility] discharge. She was discharged on ASA 325mg . She saw Ermalinda Barrios PA-C in follow-up 03/18/17 reporting episode of weakness/near syncope. She had not eaten anything all day and did not take her diabetes medicine. 911 was called but she and her husband are adamant they did not check her sugar or blood pressure (?). She was offered transport to hospital but declined. She was also treated for a UTI by PCP. Last labs 12/3 showed Hgb 12.7, WBC 12.1, plt 455; 03/18/17 showed Cr 1.15 up from prior 0.8-1.0, K 4.6, Na 139, glucose 155. Earlier admission labs included Mg 1.6, TSH wnl, A1c of 9, LFTs ok, LDL 23.  Since that visit she was noted to have episode of near syncope as well as orthostatic hypotension noted at [facility]. She was walking on the track and began to feel clammy and weak. Her initial BP was 106/56, standing 100/60, and was 85/64 during episode of weakness.  She was given gatorade with improvement in symptoms and BP. Since that episode she's actually felt much better, save for some vomiting on Christmas Day which she felt was due to antibiotic for UTI. No CP, SOB, palpitations, or bleeding. She has chronic LEE which she states is the best it has looked in years.   Past Medical History:  Diagnosis Date  . CAD in native artery    a. s/p CABGx3 in 01/2017.  Marland Kitchen Chronic diastolic CHF (congestive heart failure) ([place])   . Diabetes mellitus ([place])   . Diabetic neuropathy ([place])   . History of kidney stones    history of stones  . Hypertension   . Hypothyroidism   . NSTEMI (non-ST elevated myocardial infarction) ([place])   . Orthostatic hypotension   . Postoperative atrial fibrillation ([place])    a. after CABG 01/2017.  Marland Kitchen TIA (transient ischemic attack)     Past Surgical History:  Procedure Laterality Date  . ABDOMINAL HYSTERECTOMY    . CORONARY ARTERY BYPASS GRAFT N/A 02/24/2017   Procedure: CORONARY ARTERY BYPASS GRAFTING (CABG) USING LEFT INTERNAL MAMMARY ARTERY TO LAD AND ENDOSCOPICALLY HARVESTED GREATER SAPHENOUS VEIN TO PDA AND TO OM1.;  Surgeon: Grace Isaac, MD;  Location: [facility];  Service: Open Heart Surgery;  Laterality: N/A;  . ENDOVEIN HARVEST OF GREATER SAPHENOUS VEIN Right 02/24/2017   Procedure: ENDOVEIN HARVEST OF GREATER SAPHENOUS VEIN;  Surgeon: Grace Isaac, MD;  Location: [facility];  Service: Open Heart Surgery;  Laterality: Right;  . LAPAROSCOPIC GASTRIC BANDING    . LEFT HEART CATH AND CORONARY ANGIOGRAPHY N/A 02/18/2017   Procedure: LEFT HEART CATH AND CORONARY ANGIOGRAPHY;  Surgeon: Leonie Man, MD;  Location: [facility];  Service: Cardiovascular;  Laterality: N/A;  . TEE WITHOUT CARDIOVERSION N/A 02/24/2017   Procedure: TRANSESOPHAGEAL ECHOCARDIOGRAM (TEE);  Surgeon: Grace Isaac, MD;  Location: [facility];  Service: Open Heart Surgery;  Laterality: N/A;    Current Medications: Current Meds  Medication  Sig  . acetaminophen (TYLENOL) 325 MG tablet Take 2 tablets (650 mg total) by mouth every 6 (six) hours as needed for mild pain.  Marland Kitchen amiodarone (PACERONE) 200 MG tablet Take 1 tablet (200 mg total) by mouth every 12 (twelve) hours. Then on 12/8 decrease to 200 mg daily  . amLODipine (NORVASC) 10 MG tablet Take 10 mg daily by mouth.  Marland Kitchen aspirin EC 325 MG tablet Take 325 mg daily by mouth.  Marland Kitchen atorvastatin (LIPITOR) 80 MG tablet Take 1 tablet (80 mg total) by mouth daily at 6 PM.  . carvedilol (COREG) 25 MG tablet Take 25 mg 2 (two) times daily by mouth.  . cholecalciferol (VITAMIN D) 1000 units tablet Take 3,000 Units 2 (two) times daily by mouth.  . co-enzyme Q-10 30 MG capsule Take 30 mg daily by mouth.  . furosemide (LASIX) 40 MG tablet Take 40 mg daily by mouth.  . gabapentin (NEURONTIN) 100 MG capsule Take 100 mg 3 (three) times daily by mouth.  Marland Kitchen glipiZIDE (GLUCOTROL) 10 MG tablet Take 10 mg 2 (two) times daily by mouth.  . Glucosamine-Chondroit-Vit C-Mn (GLUCOSAMINE CHONDR 500 COMPLEX PO) Take 1 tablet 2 (two) times daily by mouth.  Javier Docker Oil 1000 MG CAPS Take 1,000 mg daily by mouth.  . levothyroxine (SYNTHROID, LEVOTHROID) 175 MCG tablet Take 175 mcg daily before breakfast by mouth.  Marland Kitchen lisinopril (PRINIVIL,ZESTRIL) 5 MG tablet Take 1 tablet (5 mg total) by mouth daily.  Marland Kitchen MELATONIN PO Take 10 mg at bedtime by mouth.  . metFORMIN (GLUCOPHAGE) 1000 MG tablet Take 1,000 mg 2 (two) times daily by mouth.  . Multiple Vitamin (MULTIVITAMIN WITH MINERALS) TABS tablet Take 1 tablet daily by mouth.  Glory Rosebush DELICA LANCETS FINE MISC USE TO CHECK BLOOD SUGAR DAILY  . potassium gluconate (EQL POTASSIUM GLUCONATE) 595 (99 K) MG TABS tablet Take 1,785 mg 2 (two) times daily by mouth.  . sitaGLIPtin (JANUVIA) 50 MG tablet Take 50 mg daily by mouth.     Allergies:   Sulfa antibiotics; Shellfish allergy; and Aggrenox [aspirin-dipyridamole er]   Social History   Socioeconomic History  . Marital  status: Single    Spouse name: None  . Number of children: None  . Years of education: None  . Highest education level: None  Social Needs  . Financial resource strain: None  . Food insecurity - worry: None  . Food insecurity - inability: None  . Transportation needs - medical: None  . Transportation needs - non-medical: None  Occupational History  . None  Tobacco Use  . Smoking status: Former Smoker    Types: Cigarettes    Start date: 1967    Last attempt to quit: 1975    Years since quitting: 44.0  . Smokeless tobacco: Never Used  Substance and Sexual Activity  . Alcohol use: No    Frequency: Never  . Drug use: No  . Sexual activity: None  Other Topics Concern  . None  Social History  Narrative  . None     Family History:  Family History  Problem Relation Age of Onset  . Heart disease Mother 78       CABG  . CVA Father     ROS:   Please see the history of present illness. All other systems are reviewed and otherwise negative.    PHYSICAL EXAM:   VS:  Ht 5\' 3"  (1.6 m)   Wt 190 lb (86.2 kg)   LMP  (LMP Unknown)   SpO2 98%   BMI 33.66 kg/m   BMI: Body mass index is 33.66 kg/m. GEN: Well nourished, well developed well appearing WF, in no acute distress  HEENT: normocephalic, atraumatic Neck: no JVD, carotid bruits, or masses Cardiac: RRR; no murmurs, rubs, or gallops, large stalk like leg habitus with generalized width but no pitting edema  Respiratory:  clear to auscultation bilaterally, normal work of breathing GI: soft, nontender, nondistended, + BS MS: no deformity or atrophy  Skin: warm and dry, no rash, sternal incision without dehiscence Neuro:  Alert and Oriented x 3, Strength and sensation are intact, follows commands Psych: euthymic mood, full affect  Wt Readings from Last 3 Encounters:  03/28/17 190 lb (86.2 kg)  03/18/17 203 lb 12.8 oz (92.4 kg)  03/03/17 204 lb 9.4 oz (92.8 kg)      Studies/Labs Reviewed:   EKG:  EKG was ordered today  and personally reviewed by me and demonstrates NSR 80bpm nonspecific ST-T changes grossly similar to hospital EKGS, QTc 444ms  Recent Labs: 02/19/2017: ALT 20 03/01/2017: Magnesium 1.6; TSH 0.561 03/03/2017: Hemoglobin 12.7; Platelets 455 03/18/2017: BUN 15; Creatinine, Ser 1.15; Potassium 4.6; Sodium 139   Lipid Panel    Component Value Date/Time   CHOL 125 02/17/2017 0042   TRIG 358 (H) 02/17/2017 0042   HDL 30 (L) 02/17/2017 0042   CHOLHDL 4.2 02/17/2017 0042   VLDL 72 (H) 02/17/2017 0042   LDLCALC 23 02/17/2017 0042    Additional studies/ records that were reviewed today include: Summarized above.    ASSESSMENT & PLAN:   1. Pre-syncope with documented hypotension - based on recent rise in Cr and nature of symptoms, I wonder if she has been intravascularly depleted. Orthostatics were negative in clinic today which coincides with her feeling better. She has lost 13lb since hospitalization so will cut her Lasix back to 1/2 tablet a day. Update CBC, BMET, Mg to exclude any progressive metabolic abnormalities. For completeness will place 30 day monitor to exclude any contribution of arrhythmia. This will also allow [place] to screen for recurrent atrial fib to make informed recommendation regarding long-term anticoagulation. 2. CAD - otherwise stable. Continue ASA. Once CBC is back, will review with MD whether Plavix should be considered given NSTEMI (will need to take into account possible future need for anticoagulation if recurrent AF is discovered). 3. Post-op atrial fib - event monitor as above. Continue amiodarone for now, anticipate approximately 2-3 months of therapy. 4. Chronic diastolic CHF - appears euvolemic. Follow on lower dose of Lasix.  Disposition: F/u with Ermalinda Barrios PA-C in approximately 6 weeks (had f/u appt scheduled but will move out shortly thereafter to allow time for monitor). She also has f/u in January with [facility].  Medication Adjustments/Labs and Tests  Ordered: Current medicines are reviewed at length with the patient today.  Concerns regarding medicines are outlined above. Medication changes, Labs and Tests ordered today are summarized above and listed in the Patient Instructions accessible in Encounters.  Signed, Charlie Pitter, PA-C  03/28/2017 4:07 PM    [facility] [place], [place], [place]  [address] Phone: [phone]; Fax: [phone]

## 2017-03-29 LAB — BASIC METABOLIC PANEL
BUN / CREAT RATIO: 14 (ref 12–28)
BUN: 16 mg/dL (ref 8–27)
CALCIUM: 10.3 mg/dL (ref 8.7–10.3)
CHLORIDE: 100 mmol/L (ref 96–106)
CO2: 21 mmol/L (ref 20–29)
CREATININE: 1.16 mg/dL — AB (ref 0.57–1.00)
GFR calc Af Amer: 56 mL/min/{1.73_m2} — ABNORMAL LOW (ref >59)
GFR calc non Af Amer: 48 mL/min/{1.73_m2} — ABNORMAL LOW (ref >59)
GLUCOSE: 91 mg/dL (ref 65–99)
Potassium: 4.8 mmol/L (ref 3.5–5.2)
Sodium: 139 mmol/L (ref 134–144)

## 2017-03-29 LAB — MAGNESIUM: MAGNESIUM: 1.8 mg/dL (ref 1.6–2.3)

## 2017-03-29 LAB — CBC
HEMATOCRIT: 39.6 % (ref 34.0–46.6)
HEMOGLOBIN: 13.1 g/dL (ref 11.1–15.9)
MCH: 29.7 pg (ref 26.6–33.0)
MCHC: 33.1 g/dL (ref 31.5–35.7)
MCV: 90 fL (ref 79–97)
Platelets: 486 10*3/uL — ABNORMAL HIGH (ref 150–379)
RBC: 4.41 x10E6/uL (ref 3.77–5.28)
RDW: 14 % (ref 12.3–15.4)
WBC: 9.8 10*3/uL (ref 3.4–10.8)

## 2017-03-31 ENCOUNTER — Telehealth: Payer: Self-pay | Admitting: *Deleted

## 2017-03-31 MED ORDER — ASPIRIN EC 81 MG PO TBEC: 81.0000 mg | DELAYED_RELEASE_TABLET | Freq: Every day | ORAL | 3 refills | Status: DC

## 2017-03-31 MED ORDER — CLOPIDOGREL BISULFATE 75 MG PO TABS: 75.0000 mg | ORAL_TABLET | Freq: Every day | ORAL | 3 refills | Status: DC

## 2017-03-31 NOTE — Telephone Encounter (Signed)
-----   Message from Charlie Pitter, [place] sent at 03/31/2017  8:39 AM EST ----- Anderson Malta, please let patient know labs appeared stable. Kidney function continued to be mildly elevated so continue plan to cut back Lasix as discussed. Given K of 4.8 would also recommend she drop her potassium supplement to once a day. Will forward to Dr. Angelena Form for input whether Plavix should be considered given NSTEMI (also will await 30 day monitor to exclude recurrent atrial fib which was noted post-operatively - did not go home on anything other than ASA 325mg  daily). Dayna Dunn PA-C

## 2017-03-31 NOTE — Telephone Encounter (Signed)
-----   Message from Charlie Pitter, [place] sent at 03/31/2017 12:11 PM EST ----- She is also OK to resume cardiac rehab as long as she is feeling well. Dayna Dunn PA-C

## 2017-04-03 ENCOUNTER — Ambulatory Visit: Payer: Medicare HMO | Admitting: Cardiothoracic Surgery

## 2017-04-03 ENCOUNTER — Telehealth: Payer: Self-pay | Admitting: Cardiovascular Disease

## 2017-04-03 DIAGNOSIS — R55 Syncope and collapse: Secondary | ICD-10-CM | POA: Diagnosis not present

## 2017-04-03 NOTE — Telephone Encounter (Signed)
New Message    Theresa Ray from [facility] in [place]. Patient did come in to see about being admitted to the cardiac rehab phase 2. But they were not able to admit the patient due to major orthostatic hypertension and very symptomatic. She was light headed with blurred vision. BP was low as 77/58. Please call.

## 2017-04-03 NOTE — Telephone Encounter (Signed)
Left message to call back  

## 2017-04-04 MED ORDER — CARVEDILOL 12.5 MG PO TABS: 12.5000 mg | ORAL_TABLET | Freq: Two times a day (BID) | ORAL | 3 refills | Status: DC

## 2017-04-04 NOTE — Telephone Encounter (Signed)
I spoke with pt and gave her instructions from Dr. Angelena Form. She will call back next Thursday with readings.

## 2017-04-04 NOTE — Telephone Encounter (Signed)
I spoke with Theresa Ray at Cardiac Rehab. Pt returned to rehab yesterday and had orthostatic hypotension again. BP was 91/61 sitting and 89/60 standing in left arm and 104/59 sitting and 77/58 in right arm.  This was around 11 AM. Pt reported tunnel vision at that time.  She was given gatorade and BP improved to 104/62.  Pt was feeling better when leaving rehab. Pt reported to Theresa Ray she is getting more energy at home but does have similar spells of feeling light headed and tunnel vision at home at times.  She sits down and feels better. Per cardiac rehab guidelines pt unable to attend rehab until BP improves.  Pt is currently wearing 30 day event monitor. Will forward to Dr. Angelena Form for review/recommendations.

## 2017-04-04 NOTE — Telephone Encounter (Signed)
It sounds like we need to cut her meds back.I would have her cut her hold Norvasc for now and reduce Coreg to 12.5 mg po BID. Have her push hydration over the next few days. Call back next week with bP readings at home. Thanks,chris

## 2017-04-09 DIAGNOSIS — R69 Illness, unspecified: Secondary | ICD-10-CM | POA: Diagnosis not present

## 2017-04-09 DIAGNOSIS — I10 Essential (primary) hypertension: Secondary | ICD-10-CM | POA: Diagnosis not present

## 2017-04-09 DIAGNOSIS — E119 Type 2 diabetes mellitus without complications: Secondary | ICD-10-CM | POA: Diagnosis not present

## 2017-04-09 DIAGNOSIS — Z951 Presence of aortocoronary bypass graft: Secondary | ICD-10-CM | POA: Diagnosis not present

## 2017-04-09 DIAGNOSIS — E785 Hyperlipidemia, unspecified: Secondary | ICD-10-CM | POA: Diagnosis not present

## 2017-04-09 DIAGNOSIS — E039 Hypothyroidism, unspecified: Secondary | ICD-10-CM | POA: Diagnosis not present

## 2017-04-09 DIAGNOSIS — Z7982 Long term (current) use of aspirin: Secondary | ICD-10-CM | POA: Diagnosis not present

## 2017-04-09 DIAGNOSIS — Z79899 Other long term (current) drug therapy: Secondary | ICD-10-CM | POA: Diagnosis not present

## 2017-04-09 DIAGNOSIS — I252 Old myocardial infarction: Secondary | ICD-10-CM | POA: Diagnosis not present

## 2017-04-10 NOTE — Telephone Encounter (Signed)
I spoke with pt. She reports she is doing better. No more dizziness.  Has been accepted back into Cardiac Rehab and BP is checked there.  She will bring list of readings to next office visit.

## 2017-04-11 DIAGNOSIS — Z79899 Other long term (current) drug therapy: Secondary | ICD-10-CM | POA: Diagnosis not present

## 2017-04-11 DIAGNOSIS — I10 Essential (primary) hypertension: Secondary | ICD-10-CM | POA: Diagnosis not present

## 2017-04-11 DIAGNOSIS — R69 Illness, unspecified: Secondary | ICD-10-CM | POA: Diagnosis not present

## 2017-04-11 DIAGNOSIS — Z7982 Long term (current) use of aspirin: Secondary | ICD-10-CM | POA: Diagnosis not present

## 2017-04-11 DIAGNOSIS — E785 Hyperlipidemia, unspecified: Secondary | ICD-10-CM | POA: Diagnosis not present

## 2017-04-11 DIAGNOSIS — Z951 Presence of aortocoronary bypass graft: Secondary | ICD-10-CM | POA: Diagnosis not present

## 2017-04-11 DIAGNOSIS — E119 Type 2 diabetes mellitus without complications: Secondary | ICD-10-CM | POA: Diagnosis not present

## 2017-04-11 DIAGNOSIS — I252 Old myocardial infarction: Secondary | ICD-10-CM | POA: Diagnosis not present

## 2017-04-11 DIAGNOSIS — E039 Hypothyroidism, unspecified: Secondary | ICD-10-CM | POA: Diagnosis not present

## 2017-04-14 ENCOUNTER — Other Ambulatory Visit: Payer: Self-pay

## 2017-04-14 ENCOUNTER — Other Ambulatory Visit: Payer: Self-pay | Admitting: Cardiothoracic Surgery

## 2017-04-14 ENCOUNTER — Ambulatory Visit (INDEPENDENT_AMBULATORY_CARE_PROVIDER_SITE_OTHER): Payer: Self-pay | Admitting: Surgical

## 2017-04-14 ENCOUNTER — Encounter (HOSPITAL_COMMUNITY): Payer: Self-pay

## 2017-04-14 ENCOUNTER — Ambulatory Visit
Admission: RE | Admit: 2017-04-14 | Discharge: 2017-04-14 | Disposition: A | Discharge status: Home / Self Care | Payer: Medicare HMO | Source: Ambulatory Visit | Attending: Thoracic Surgery (Cardiothoracic Vascular Surgery) | Admitting: Thoracic Surgery (Cardiothoracic Vascular Surgery)

## 2017-04-14 VITALS — BP 136/84 | HR 82 | Resp 18 | Ht 63.0 in | Wt 203.6 lb

## 2017-04-14 DIAGNOSIS — I10 Essential (primary) hypertension: Secondary | ICD-10-CM | POA: Diagnosis not present

## 2017-04-14 DIAGNOSIS — E785 Hyperlipidemia, unspecified: Secondary | ICD-10-CM | POA: Diagnosis not present

## 2017-04-14 DIAGNOSIS — Z79899 Other long term (current) drug therapy: Secondary | ICD-10-CM | POA: Diagnosis not present

## 2017-04-14 DIAGNOSIS — Z7982 Long term (current) use of aspirin: Secondary | ICD-10-CM | POA: Diagnosis not present

## 2017-04-14 DIAGNOSIS — Z951 Presence of aortocoronary bypass graft: Secondary | ICD-10-CM

## 2017-04-14 DIAGNOSIS — R69 Illness, unspecified: Secondary | ICD-10-CM | POA: Diagnosis not present

## 2017-04-14 DIAGNOSIS — E119 Type 2 diabetes mellitus without complications: Secondary | ICD-10-CM | POA: Diagnosis not present

## 2017-04-14 DIAGNOSIS — E039 Hypothyroidism, unspecified: Secondary | ICD-10-CM | POA: Diagnosis not present

## 2017-04-14 DIAGNOSIS — I252 Old myocardial infarction: Secondary | ICD-10-CM | POA: Diagnosis not present

## 2017-04-14 NOTE — Patient Instructions (Signed)
Gived further instructions regarding activity progression including ambulation and lifting restrictions.  Additionally driving instructions.

## 2017-04-14 NOTE — Progress Notes (Signed)
[address]resa LakeSuite [address]       Theresa Ray,[place] [address]             [phone]      Theresa Ray [facility] Medical Record #[id number] Date of Birth: 06/20/1945  Referring: Leonie Man, MD Primary Care: Ronita Hipps, MD Primary Cardiologist: No primary care provider on file.   Chief Complaint:   POST OP FOLLOW UP  LOCATION:  [facility]                         FACILITY:  [facility]  PHYSICIAN:  Lanelle Bal, MD    DATE OF BIRTH:  1945-07-03  DATE OF PROCEDURE:  02/24/2017 OPERATIVE REPORT PREOPERATIVE DIAGNOSIS:  Three-vessel coronary artery disease with recent non-ST-elevation myocardial infarction. POSTOPERATIVE DIAGNOSIS:  Three-vessel coronary artery disease with recent non-ST-elevation myocardial infarction. SURGICAL PROCEDURE:  Coronary artery bypass grafting x3 with the left internal mammary to the left anterior descending coronary artery, reversed saphenous vein graft to the first obtuse marginal, reversed saphenous vein graft to the posterior descending with right leg greater saphenous endoscopic vein harvesting from the thigh and calf.  SURGEON:  Lanelle Bal, MD.  FIRST ASSISTANT:  Ellwood Handler, PA.    History of Present Illness:    Patient is a 72 year old female status post the above described procedures seen in the office on today's date and routine postsurgical follow-up.  Overall she has made fairly slow recovery.  She has had to have her medications adjusted by cardiology due to low blood pressure.  Her physical rehabilitation is also been.  She said today is the first day she was able to take a shower without being physically exhausted.  She denies fevers chills or other constitutional symptoms currently.  Her blood sugars have been under fairly poor control and she stopped taking her Januvia due to cost.  She does have a follow-up with her primary physician who manages her diabetes medications in the near future.  She continues to have some  lower extremity edema and is on daily Lasix.  She has not had any significant incisional pain and is not taking any pain medication.  She is scheduled to start the cardiac rehab program soon.  She has been unable to start thus far due to low blood pressure readings.      Past Medical History:  Diagnosis Date  . CAD in native artery    a. s/p CABGx3 in 01/2017.  Theresa Ray Chronic diastolic CHF (congestive heart failure) ([place])   . Diabetes mellitus ([place])   . Diabetic neuropathy ([place])   . History of kidney stones    history of stones  . Hypertension   . Hypothyroidism   . NSTEMI (non-ST elevated myocardial infarction) ([place])   . Orthostatic hypotension   . Postoperative atrial fibrillation ([place])    a. after CABG 01/2017.  Theresa Ray TIA (transient ischemic attack)      Social History   Tobacco Use  Smoking Status Former Smoker  . Types: Cigarettes  . Start date: 39  . Last attempt to quit: 1975  . Years since quitting: 44.0  Smokeless Tobacco Never Used    Social History   Substance and Sexual Activity  Alcohol Use No  . Frequency: Never     Allergies  Allergen Reactions  . Sulfa Antibiotics Nausea And Vomiting  . Shellfish Allergy Other (See Comments)    UNKNOWN REACTION.  Allergy found on ALLERGY TEST.  Theresa Ray  Aggrenox [Aspirin-Dipyridamole Er] Other (See Comments)    Redness in eyes, takes aspirin 325 at home at baseline    Current Outpatient Medications  Medication Sig Dispense Refill  . acetaminophen (TYLENOL) 325 MG tablet Take 2 tablets (650 mg total) by mouth every 6 (six) hours as needed for mild pain.    Theresa Ray amiodarone (PACERONE) 200 MG tablet Take 1 tablet (200 mg total) by mouth every 12 (twelve) hours. Then on 12/8 decrease to 200 mg daily 90 tablet 1  . aspirin EC 81 MG tablet Take 1 tablet (81 mg total) by mouth daily. 90 tablet 3  . atorvastatin (LIPITOR) 80 MG tablet Take 1 tablet (80 mg total) by mouth daily at 6 PM. 30 tablet 3  . carvedilol (COREG) 12.5 MG tablet  Take 1 tablet (12.5 mg total) by mouth 2 (two) times daily. (Patient taking differently: Take 12.5 mg by mouth 2 (two) times daily. ) 60 tablet 3  . cholecalciferol (VITAMIN D) 1000 units tablet Take 3,000 Units 2 (two) times daily by mouth.    . clopidogrel (PLAVIX) 75 MG tablet Take 1 tablet (75 mg total) by mouth daily. 90 tablet 3  . co-enzyme Q-10 30 MG capsule Take 30 mg daily by mouth.    . furosemide (LASIX) 40 MG tablet Take 0.5 tablets (20 mg total) by mouth daily. 90 tablet 3  . glipiZIDE (GLUCOTROL) 10 MG tablet Take 10 mg 2 (two) times daily by mouth.    . Glucosamine-Chondroit-Vit C-Mn (GLUCOSAMINE CHONDR 500 COMPLEX PO) Take 1 tablet 2 (two) times daily by mouth.    Javier Docker Oil 1000 MG CAPS Take 1,000 mg daily by mouth.    . levothyroxine (SYNTHROID, LEVOTHROID) 175 MCG tablet Take 175 mcg daily before breakfast by mouth.    Theresa Ray lisinopril (PRINIVIL,ZESTRIL) 5 MG tablet Take 1 tablet (5 mg total) by mouth daily. 30 tablet 3  . MELATONIN PO Take 10 mg at bedtime by mouth.    . metFORMIN (GLUCOPHAGE) 1000 MG tablet Take 1,000 mg 2 (two) times daily by mouth.    . Multiple Vitamin (MULTIVITAMIN WITH MINERALS) TABS tablet Take 1 tablet daily by mouth.    Glory Rosebush DELICA LANCETS FINE MISC USE TO CHECK BLOOD SUGAR DAILY  3  . potassium gluconate (EQL POTASSIUM GLUCONATE) 595 (99 K) MG TABS tablet Take 1,785 mg by mouth daily.     No current facility-administered medications for this visit.        Physical Exam: BP 136/84 (BP Location: Right Arm, Patient Position: Sitting, Cuff Size: Large)   Pulse 82   Resp 18   Ht 5\' 3"  (1.6 m)   Wt 203 lb 9.6 oz (92.4 kg)   SpO2 98% Comment: ra  BMI 36.07 kg/m   General appearance: alert, cooperative and no distress Heart: regular rate and rhythm Lungs: clear to auscultation bilaterally Extremities: Bilateral lower extremity edema Wound: Incisions well-healed without evidence of infection.   Diagnostic Studies & Laboratory data:       Recent Radiology Findings:   Dg Chest 2 View  Result Date: 04/14/2017 CLINICAL DATA:  History of CABG EXAM: CHEST  2 VIEW COMPARISON:  02/28/2017 FINDINGS: Post sternotomy changes. No consolidation or effusion. Normal heart size. No pneumothorax. Degenerative changes of the spine. IMPRESSION: No active cardiopulmonary disease.  Post sternotomy changes. Electronically Signed   By: Donavan Foil M.D.   On: 04/14/2017 14:19      Recent Lab Findings: Lab Results  Component Value Date  WBC 9.8 03/28/2017   HGB 13.1 03/28/2017   HCT 39.6 03/28/2017   PLT 486 (H) 03/28/2017   GLUCOSE 91 03/28/2017   CHOL 125 02/17/2017   TRIG 358 (H) 02/17/2017   HDL 30 (L) 02/17/2017   LDLCALC 23 02/17/2017   ALT 20 02/19/2017   AST 19 02/19/2017   NA 139 03/28/2017   K 4.8 03/28/2017   CL 100 03/28/2017   CREATININE 1.16 (H) 03/28/2017   BUN 16 03/28/2017   CO2 21 03/28/2017   TSH 0.561 03/01/2017   INR 1.13 02/24/2017   HGBA1C 9.0 (H) 02/19/2017      Assessment / Plan: The patient is making slow but steady overall recovery from her CABG.  She has multiple metabolic disorders that are limiting her progress.  We did have a long discussion about lower carbohydrate intake and weight loss.  She and her husband both do appear to be interested in making some changes in this regard.  We will see her again on a as needed basis for any surgically related issues or at request.          John Giovanni, PA-C 04/14/2017 3:11 PM

## 2017-04-16 DIAGNOSIS — Z7982 Long term (current) use of aspirin: Secondary | ICD-10-CM | POA: Diagnosis not present

## 2017-04-16 DIAGNOSIS — I10 Essential (primary) hypertension: Secondary | ICD-10-CM | POA: Diagnosis not present

## 2017-04-16 DIAGNOSIS — R69 Illness, unspecified: Secondary | ICD-10-CM | POA: Diagnosis not present

## 2017-04-16 DIAGNOSIS — Z79899 Other long term (current) drug therapy: Secondary | ICD-10-CM | POA: Diagnosis not present

## 2017-04-16 DIAGNOSIS — E039 Hypothyroidism, unspecified: Secondary | ICD-10-CM | POA: Diagnosis not present

## 2017-04-16 DIAGNOSIS — E785 Hyperlipidemia, unspecified: Secondary | ICD-10-CM | POA: Diagnosis not present

## 2017-04-16 DIAGNOSIS — Z951 Presence of aortocoronary bypass graft: Secondary | ICD-10-CM | POA: Diagnosis not present

## 2017-04-16 DIAGNOSIS — E119 Type 2 diabetes mellitus without complications: Secondary | ICD-10-CM | POA: Diagnosis not present

## 2017-04-16 DIAGNOSIS — I252 Old myocardial infarction: Secondary | ICD-10-CM | POA: Diagnosis not present

## 2017-04-18 DIAGNOSIS — E119 Type 2 diabetes mellitus without complications: Secondary | ICD-10-CM | POA: Diagnosis not present

## 2017-04-18 DIAGNOSIS — R69 Illness, unspecified: Secondary | ICD-10-CM | POA: Diagnosis not present

## 2017-04-18 DIAGNOSIS — Z951 Presence of aortocoronary bypass graft: Secondary | ICD-10-CM | POA: Diagnosis not present

## 2017-04-18 DIAGNOSIS — Z79899 Other long term (current) drug therapy: Secondary | ICD-10-CM | POA: Diagnosis not present

## 2017-04-18 DIAGNOSIS — I252 Old myocardial infarction: Secondary | ICD-10-CM | POA: Diagnosis not present

## 2017-04-18 DIAGNOSIS — I10 Essential (primary) hypertension: Secondary | ICD-10-CM | POA: Diagnosis not present

## 2017-04-18 DIAGNOSIS — E039 Hypothyroidism, unspecified: Secondary | ICD-10-CM | POA: Diagnosis not present

## 2017-04-18 DIAGNOSIS — Z7982 Long term (current) use of aspirin: Secondary | ICD-10-CM | POA: Diagnosis not present

## 2017-04-18 DIAGNOSIS — E785 Hyperlipidemia, unspecified: Secondary | ICD-10-CM | POA: Diagnosis not present

## 2017-04-25 DIAGNOSIS — E039 Hypothyroidism, unspecified: Secondary | ICD-10-CM | POA: Diagnosis not present

## 2017-04-25 DIAGNOSIS — I10 Essential (primary) hypertension: Secondary | ICD-10-CM | POA: Diagnosis not present

## 2017-04-25 DIAGNOSIS — Z951 Presence of aortocoronary bypass graft: Secondary | ICD-10-CM | POA: Diagnosis not present

## 2017-04-25 DIAGNOSIS — E119 Type 2 diabetes mellitus without complications: Secondary | ICD-10-CM | POA: Diagnosis not present

## 2017-04-25 DIAGNOSIS — R69 Illness, unspecified: Secondary | ICD-10-CM | POA: Diagnosis not present

## 2017-04-25 DIAGNOSIS — E785 Hyperlipidemia, unspecified: Secondary | ICD-10-CM | POA: Diagnosis not present

## 2017-04-25 DIAGNOSIS — Z79899 Other long term (current) drug therapy: Secondary | ICD-10-CM | POA: Diagnosis not present

## 2017-04-25 DIAGNOSIS — Z7982 Long term (current) use of aspirin: Secondary | ICD-10-CM | POA: Diagnosis not present

## 2017-04-25 DIAGNOSIS — I252 Old myocardial infarction: Secondary | ICD-10-CM | POA: Diagnosis not present

## 2017-04-28 ENCOUNTER — Ambulatory Visit: Payer: Medicare HMO | Admitting: Physician Assistant

## 2017-05-12 ENCOUNTER — Encounter: Payer: Self-pay | Admitting: Physician Assistant

## 2017-05-12 ENCOUNTER — Ambulatory Visit: Payer: Medicare HMO | Admitting: Physician Assistant

## 2017-05-12 VITALS — BP 128/82 | HR 100 | Ht 63.0 in | Wt 197.1 lb

## 2017-05-12 DIAGNOSIS — Z79899 Other long term (current) drug therapy: Secondary | ICD-10-CM | POA: Diagnosis not present

## 2017-05-12 DIAGNOSIS — I4891 Unspecified atrial fibrillation: Secondary | ICD-10-CM | POA: Diagnosis not present

## 2017-05-12 DIAGNOSIS — Z0000 Encounter for general adult medical examination without abnormal findings: Secondary | ICD-10-CM | POA: Diagnosis not present

## 2017-05-12 DIAGNOSIS — E1140 Type 2 diabetes mellitus with diabetic neuropathy, unspecified: Secondary | ICD-10-CM

## 2017-05-12 DIAGNOSIS — I25810 Atherosclerosis of coronary artery bypass graft(s) without angina pectoris: Secondary | ICD-10-CM | POA: Diagnosis not present

## 2017-05-12 DIAGNOSIS — E1165 Type 2 diabetes mellitus with hyperglycemia: Secondary | ICD-10-CM | POA: Diagnosis not present

## 2017-05-12 DIAGNOSIS — I480 Paroxysmal atrial fibrillation: Secondary | ICD-10-CM | POA: Insufficient documentation

## 2017-05-12 DIAGNOSIS — E785 Hyperlipidemia, unspecified: Secondary | ICD-10-CM | POA: Diagnosis not present

## 2017-05-12 DIAGNOSIS — Z6834 Body mass index (BMI) 34.0-34.9, adult: Secondary | ICD-10-CM | POA: Diagnosis not present

## 2017-05-12 DIAGNOSIS — I10 Essential (primary) hypertension: Secondary | ICD-10-CM

## 2017-05-12 DIAGNOSIS — I9789 Other postprocedural complications and disorders of the circulatory system, not elsewhere classified: Secondary | ICD-10-CM | POA: Insufficient documentation

## 2017-05-12 DIAGNOSIS — E039 Hypothyroidism, unspecified: Secondary | ICD-10-CM | POA: Diagnosis not present

## 2017-05-12 DIAGNOSIS — I2510 Atherosclerotic heart disease of native coronary artery without angina pectoris: Secondary | ICD-10-CM | POA: Insufficient documentation

## 2017-05-12 NOTE — Progress Notes (Signed)
Cardiology Office Note    Date:  05/12/2017   ID:  Theresa Ray, DOB 1945-04-04, MRN [id number]  PCP:  Ronita Hipps, MD  Cardiologist: Lauree Chandler, MD  Chief Complaint  Patient presents with  . Follow-up    History of Present Illness:  Theresa Ray is a 72 y.o. female with history of DM, HTN admitted with  a NSTEMI. 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 LV systolic function is normal. s/p CABG  x3 on 02/24/17 with a LIMA to the LAD, SVG to the PDA, SVG to OM1.  He had postop atrial fibrillation converted to normal sinus rhythm on IV amiodarone.  Chads vas score is at least 6.  Also had diastolic CHF.  Also has hypertension, diabetes mellitus with diabetic neuropathy, HLD, and history of TIA.   I saw the patient 03/18/17 at which time they described a presyncopal episode with fall at home when she had taken her diabetes medication but did not eat all day.  They called EMS but did not go to the hospital and her sugar was not checked.  She was treated for UTI by her PCP.  She then saw Hinton Dyer done 03/28/17 after another episode of near syncope as well as orthostatic hypotension noted at [facility] cardiac rehab.  She had lost 13 pounds to [place] cut her Lasix back to half a tablet daily.  30-day monitor was added.  Labs were stable.  Dr. Angelena Form held her Norvasc and decrease Coreg to 12.5 mg twice daily.Event monitor was normal.  Patient comes in today accompanied by her husband.  She is feeling much better.  Her main complaint is leg cramps that she cannot get relief from.  She is taking extra potassium.  Her potassium is been normal limits check.  She has lost 6 pounds since I saw her.  She is watching her salt and diet.  No more dizziness or presyncope.  Doing well at cardiac rehab.  I have filled out some paperwork for her to return to work part-time.   Past Medical History:  Diagnosis Date  . CAD in native artery    a. s/p CABGx3 in 01/2017.  Marland Kitchen Chronic diastolic  CHF (congestive heart failure) ([place])   . Diabetes mellitus ([place])   . Diabetic neuropathy ([employer])   . History of kidney stones    history of stones  . Hypertension   . Hypothyroidism   . NSTEMI (non-ST elevated myocardial infarction) ([place])   . Orthostatic hypotension   . Postoperative atrial fibrillation ([place])    a. after CABG 01/2017.  Marland Kitchen TIA (transient ischemic attack)     Past Surgical History:  Procedure Laterality Date  . ABDOMINAL HYSTERECTOMY    . CORONARY ARTERY BYPASS GRAFT N/A 02/24/2017   Procedure: CORONARY ARTERY BYPASS GRAFTING (CABG) USING LEFT INTERNAL MAMMARY ARTERY TO LAD AND ENDOSCOPICALLY HARVESTED GREATER SAPHENOUS VEIN TO PDA AND TO OM1.;  Surgeon: Grace Isaac, MD;  Location: [place];  Service: Open Heart Surgery;  Laterality: N/A;  . ENDOVEIN HARVEST OF GREATER SAPHENOUS VEIN Right 02/24/2017   Procedure: ENDOVEIN HARVEST OF GREATER SAPHENOUS VEIN;  Surgeon: Grace Isaac, MD;  Location: [place];  Service: Open Heart Surgery;  Laterality: Right;  . LAPAROSCOPIC GASTRIC BANDING    . LEFT HEART CATH AND CORONARY ANGIOGRAPHY N/A 02/18/2017   Procedure: LEFT HEART CATH AND CORONARY ANGIOGRAPHY;  Surgeon: Leonie Man, MD;  Location: [place] [facility];  Service: Cardiovascular;  Laterality: N/A;  .  TEE WITHOUT CARDIOVERSION N/A 02/24/2017   Procedure: TRANSESOPHAGEAL ECHOCARDIOGRAM (TEE);  Surgeon: Grace Isaac, MD;  Location: [place];  Service: Open Heart Surgery;  Laterality: N/A;    Current Medications: Current Meds  Medication Sig  . acetaminophen (TYLENOL) 325 MG tablet Take 2 tablets (650 mg total) by mouth every 6 (six) hours as needed for mild pain.  Marland Kitchen amiodarone (PACERONE) 200 MG tablet Take 1 tablet (200 mg total) by mouth every 12 (twelve) hours. Then on 12/8 decrease to 200 mg daily  . aspirin EC 81 MG tablet Take 1 tablet (81 mg total) by mouth daily.  Marland Kitchen atorvastatin (LIPITOR) 80 MG tablet Take 80 mg by mouth daily.  . carvedilol (COREG)  12.5 MG tablet Take 1 tablet (12.5 mg total) by mouth 2 (two) times daily.  . cholecalciferol (VITAMIN D) 1000 units tablet Take 3,000 Units 2 (two) times daily by mouth.  . clopidogrel (PLAVIX) 75 MG tablet Take 1 tablet (75 mg total) by mouth daily.  Marland Kitchen co-enzyme Q-10 30 MG capsule Take 30 mg daily by mouth.  Marland Kitchen glipiZIDE (GLUCOTROL) 10 MG tablet Take 10 mg 2 (two) times daily by mouth.  . Glucosamine-Chondroit-Vit C-Mn (GLUCOSAMINE CHONDR 500 COMPLEX PO) Take 1 tablet 2 (two) times daily by mouth.  Javier Docker Oil 1000 MG CAPS Take 1,000 mg daily by mouth.  . levothyroxine (SYNTHROID, LEVOTHROID) 175 MCG tablet Take 175 mcg daily before breakfast by mouth.  Marland Kitchen lisinopril (PRINIVIL,ZESTRIL) 5 MG tablet Take 1 tablet (5 mg total) by mouth daily.  Marland Kitchen MELATONIN PO Take 10 mg at bedtime by mouth.  . metFORMIN (GLUCOPHAGE) 1000 MG tablet Take 1,000 mg 2 (two) times daily by mouth.  . Multiple Vitamin (MULTIVITAMIN WITH MINERALS) TABS tablet Take 1 tablet daily by mouth.  Glory Rosebush DELICA LANCETS FINE MISC USE TO CHECK BLOOD SUGAR DAILY  . [DISCONTINUED] atorvastatin (LIPITOR) 80 MG tablet Take 1 tablet (80 mg total) by mouth daily at 6 PM.  . [DISCONTINUED] furosemide (LASIX) 40 MG tablet Take 0.5 tablets (20 mg total) by mouth daily.  . [DISCONTINUED] potassium gluconate (EQL POTASSIUM GLUCONATE) 595 (99 K) MG TABS tablet Take 1,785 mg by mouth daily.     Allergies:   Sulfa antibiotics; Shellfish allergy; and Aggrenox [aspirin-dipyridamole er]   Social History   Socioeconomic History  . Marital status: Single    Spouse name: None  . Number of children: None  . Years of education: None  . Highest education level: None  Social Needs  . Financial resource strain: None  . Food insecurity - worry: None  . Food insecurity - inability: None  . Transportation needs - medical: None  . Transportation needs - non-medical: None  Occupational History  . None  Tobacco Use  . Smoking status: Former  Smoker    Types: Cigarettes    Start date: 1967    Last attempt to quit: 1975    Years since quitting: 44.1  . Smokeless tobacco: Never Used  Substance and Sexual Activity  . Alcohol use: No    Frequency: Never  . Drug use: No  . Sexual activity: None  Other Topics Concern  . None  Social History Narrative  . None     Family History:  The patient's family history includes CVA in her father; Heart disease (age of onset: 37) in her mother.   ROS:   Please see the history of present illness.    Review of Systems  Constitution: Negative.  HENT:  Negative.   Eyes: Negative.   Cardiovascular: Negative.   Respiratory: Negative.   Hematologic/Lymphatic: Negative.   Musculoskeletal: Negative.  Negative for joint pain.  Gastrointestinal: Negative.   Genitourinary: Negative.   Neurological: Negative.    All other systems reviewed and are negative.   PHYSICAL EXAM:   VS:  BP 128/82   Pulse 100   Ht 5\' 3"  (1.6 m)   Wt 197 lb 1.9 oz (89.4 kg)   SpO2 98%   BMI 34.92 kg/m   Physical Exam  GEN: Well nourished, well developed, in no acute distress  Neck: no JVD, carotid bruits, or masses Cardiac:RRR; no murmurs, rubs, or gallops  Respiratory:  clear to auscultation bilaterally, normal work of breathing GI: soft, nontender, nondistended, + BS Ext: without cyanosis, clubbing, or edema, Good distal pulses bilaterally Neuro:  Alert and Oriented x 3, Strength and sensation are intact Psych: euthymic mood, full affect  Wt Readings from Last 3 Encounters:  05/12/17 197 lb 1.9 oz (89.4 kg)  04/14/17 203 lb 9.6 oz (92.4 kg)  03/28/17 190 lb (86.2 kg)      Studies/Labs Reviewed:   EKG:  EKG is not ordered today.   Recent Labs: 02/19/2017: ALT 20 03/01/2017: TSH 0.561 03/28/2017: BUN 16; Creatinine, Ser 1.16; Hemoglobin 13.1; Magnesium 1.8; Platelets 486; Potassium 4.8; Sodium 139   Lipid Panel    Component Value Date/Time   CHOL 125 02/17/2017 0042   TRIG 358 (H) 02/17/2017  0042   HDL 30 (L) 02/17/2017 0042   CHOLHDL 4.2 02/17/2017 0042   VLDL 72 (H) 02/17/2017 0042   LDLCALC 23 02/17/2017 0042    Additional studies/ records that were reviewed today include:   Echo 02/17/17: - Left ventricle: The cavity size was normal. Systolic function was   normal. The estimated ejection fraction was in the range of 55%   to 60%. Probable hypokinesis of the basalinferior and   inferoseptal myocardium; consistent with ischemia in the   distribution of the right coronary artery. Features are   consistent with a pseudonormal left ventricular filling pattern,   with concomitant abnormal relaxation and increased filling   pressure (grade 2 diastolic dysfunction). - Mitral valve: There was mild regurgitation.   Cardiac cath 02/18/17:  Prox LAD lesion is 70% stenosed -at Diag 1. Dist LAD lesion is 99% stenosed.  Ost 1st Diag lesion is 85% stenosed. Very small caliber vessel  Ramus Distal lesion is 100% stenosed.  Prox RCA lesion is 75% stenosed. Mid RCA to Dist RCA lesion is 100% stenosed.--The R PDA and PL systems fill via left-to-right collaterals with brisk flow.  Prox Cx lesion is 50% stenosed -proximal to OM1  Ost 1st Mrg lesion is 100% stenosed. Ostial occlusion. The vessel fills retrograde via left to left collaterals  Dist Cx lesion is 75% stenosed -in the band prior to small left posterolateral branch  There is mild left ventricular systolic dysfunction. The left ventricular ejection fraction is 50-55% by visual estimate.  LV end diastolic pressure is moderately elevated.     Cardiac cath 02/18/17:  Prox LAD lesion is 70% stenosed -at Diag 1. Dist LAD lesion is 99% stenosed.  Ost 1st Diag lesion is 85% stenosed. Very small caliber vessel  Ramus Distal lesion is 100% stenosed.  Prox RCA lesion is 75% stenosed. Mid RCA to Dist RCA lesion is 100% stenosed.--The R PDA and PL systems fill via left-to-right collaterals with brisk flow.  Prox Cx lesion  is 50% stenosed -proximal to OM1  Ost 1st Mrg lesion is 100% stenosed. Ostial occlusion. The vessel fills retrograde via left to left collaterals  Dist Cx lesion is 75% stenosed -in the band prior to small left posterolateral branch  There is mild left ventricular systolic dysfunction. The left ventricular ejection fraction is 50-55% by visual estimate.  LV end diastolic pressure is moderately elevated.      ASSESSMENT:    1. Coronary artery disease involving coronary bypass graft of native heart without angina pectoris   2. Postoperative atrial fibrillation (HCC)   3. Essential hypertension   4. Hyperlipidemia, unspecified hyperlipidemia type   5. Type 2 diabetes mellitus with diabetic neuropathy, without long-term current use of insulin (HCC)      PLAN:  In order of problems listed above:  CAD status post NSTEMI followed by CABG times 3 02/24/17 with a LIMA to the LAD, SVG to PDA, SVG to OM1.  Postop atrial fibrillation converted to normal sinus rhythm on IV amiodarone and now on oral.  Continue for another month or so.  No arrhythmias on recent Holter monitor.  CHA2DS2-VASc equals 6 on aspirin and Plavix for now.  Postop atrial fibrillation maintaining normal sinus rhythm.  Hopefully can stop the amiodarone next month.  Essential hypertension patient has had trouble with orthostatic hypotension with near syncope on several occasions.  Medications have been adjusted.  Still having severe leg cramps.  Will try stopping Lasix and potassium to see if this helps.  She will weigh herself daily and watch for edema.  Follow-up with Dr. Angelena Form in March.  Hyperlipidemia on Lipitor 80 mg daily  Type 2 diabetes mellitus managed by primary care   Medication Adjustments/Labs and Tests Ordered: Current medicines are reviewed at length with the patient today.  Concerns regarding medicines are outlined above.  Medication changes, Labs and Tests ordered today are listed in the Patient  Instructions below. Patient Instructions  Medication Instructions:  Your physician has recommended you make the following change in your medication:  1.  STOP the Lasix 2.  STOP the Potassium  Labwork: None ordered  Testing/Procedures: None ordered  Follow-Up: Your physician recommends that you schedule a follow-up appointment in: [place]   Any Other Special Instructions Will Be Listed Below (If Applicable).     If you need a refill on your cardiac medications before your next appointment, please call your pharmacy.      Signed, Ermalinda Barrios, PA-C  05/12/2017 2:16 PM    [facility] [address], [place], [place]  [address] Phone: [phone]; Fax: [phone]

## 2017-05-12 NOTE — Patient Instructions (Addendum)
Medication Instructions:  Your physician has recommended you make the following change in your medication:  1.  STOP the Lasix 2.  STOP the Potassium  Labwork: None ordered  Testing/Procedures: None ordered  Follow-Up: Your physician recommends that you schedule a follow-up appointment in: [place]   Any Other Special Instructions Will Be Listed Below (If Applicable).     If you need a refill on your cardiac medications before your next appointment, please call your pharmacy.

## 2017-05-16 ENCOUNTER — Telehealth: Payer: Self-pay | Admitting: Cardiovascular Disease

## 2017-05-16 NOTE — Telephone Encounter (Signed)
Dr.McAlhany signed & completed Initial Disability Claim Form for patient. Patient called and aware ready for pick up. Placed at front desk.

## 2017-05-19 DIAGNOSIS — I10 Essential (primary) hypertension: Secondary | ICD-10-CM | POA: Diagnosis not present

## 2017-05-19 DIAGNOSIS — E039 Hypothyroidism, unspecified: Secondary | ICD-10-CM | POA: Diagnosis not present

## 2017-05-19 DIAGNOSIS — Z79899 Other long term (current) drug therapy: Secondary | ICD-10-CM | POA: Diagnosis not present

## 2017-05-19 DIAGNOSIS — Z7982 Long term (current) use of aspirin: Secondary | ICD-10-CM | POA: Diagnosis not present

## 2017-05-19 DIAGNOSIS — I252 Old myocardial infarction: Secondary | ICD-10-CM | POA: Diagnosis not present

## 2017-05-19 DIAGNOSIS — R69 Illness, unspecified: Secondary | ICD-10-CM | POA: Diagnosis not present

## 2017-05-19 DIAGNOSIS — E785 Hyperlipidemia, unspecified: Secondary | ICD-10-CM | POA: Diagnosis not present

## 2017-05-19 DIAGNOSIS — Z951 Presence of aortocoronary bypass graft: Secondary | ICD-10-CM | POA: Diagnosis not present

## 2017-05-19 DIAGNOSIS — E119 Type 2 diabetes mellitus without complications: Secondary | ICD-10-CM | POA: Diagnosis not present

## 2017-05-28 DIAGNOSIS — E039 Hypothyroidism, unspecified: Secondary | ICD-10-CM | POA: Diagnosis not present

## 2017-05-28 DIAGNOSIS — I252 Old myocardial infarction: Secondary | ICD-10-CM | POA: Diagnosis not present

## 2017-05-28 DIAGNOSIS — Z79899 Other long term (current) drug therapy: Secondary | ICD-10-CM | POA: Diagnosis not present

## 2017-05-28 DIAGNOSIS — R69 Illness, unspecified: Secondary | ICD-10-CM | POA: Diagnosis not present

## 2017-05-28 DIAGNOSIS — Z7982 Long term (current) use of aspirin: Secondary | ICD-10-CM | POA: Diagnosis not present

## 2017-05-28 DIAGNOSIS — I10 Essential (primary) hypertension: Secondary | ICD-10-CM | POA: Diagnosis not present

## 2017-05-28 DIAGNOSIS — Z951 Presence of aortocoronary bypass graft: Secondary | ICD-10-CM | POA: Diagnosis not present

## 2017-05-28 DIAGNOSIS — E785 Hyperlipidemia, unspecified: Secondary | ICD-10-CM | POA: Diagnosis not present

## 2017-05-28 DIAGNOSIS — E119 Type 2 diabetes mellitus without complications: Secondary | ICD-10-CM | POA: Diagnosis not present

## 2017-06-04 DIAGNOSIS — Z951 Presence of aortocoronary bypass graft: Secondary | ICD-10-CM | POA: Diagnosis not present

## 2017-06-16 ENCOUNTER — Telehealth: Payer: Self-pay | Admitting: Cardiovascular Disease

## 2017-06-16 NOTE — Telephone Encounter (Signed)
Left message to call back  

## 2017-06-16 NOTE — Telephone Encounter (Signed)
New message    Needs Dr Angelena Form signature on cardiac rehab , needs this sent back today

## 2017-06-16 NOTE — Telephone Encounter (Signed)
I spoke with Theresa Ray and told her this was faxed on 04/04/17. She did not receive this. Will refax.  I confirmed fax number [phone].  Form faxed and confirmation received.

## 2017-06-23 ENCOUNTER — Ambulatory Visit: Payer: Medicare HMO | Admitting: Cardiovascular Disease

## 2017-06-23 ENCOUNTER — Encounter: Payer: Self-pay | Admitting: Cardiovascular Disease

## 2017-06-23 VITALS — BP 136/72 | HR 65 | Ht 63.0 in | Wt 198.4 lb

## 2017-06-23 DIAGNOSIS — I25810 Atherosclerosis of coronary artery bypass graft(s) without angina pectoris: Secondary | ICD-10-CM | POA: Diagnosis not present

## 2017-06-23 DIAGNOSIS — I4891 Unspecified atrial fibrillation: Secondary | ICD-10-CM

## 2017-06-23 DIAGNOSIS — I9789 Other postprocedural complications and disorders of the circulatory system, not elsewhere classified: Secondary | ICD-10-CM | POA: Diagnosis not present

## 2017-06-23 DIAGNOSIS — I5032 Chronic diastolic (congestive) heart failure: Secondary | ICD-10-CM

## 2017-06-23 MED ORDER — ATORVASTATIN CALCIUM 40 MG PO TABS: 40.0000 mg | ORAL_TABLET | Freq: Every day | ORAL | 3 refills | Status: DC

## 2017-06-23 NOTE — Progress Notes (Signed)
Chief Complaint  Patient presents with  . Coronary Artery Disease   History of Present Illness: 72 yo female with history of chronic diastolic CHF, DM, HTN and CAD s/p CABG who is here today for cardiac follow up. She was admitted to [facility] in November 2018 with a NSTEMI and found to have severe three vessel CAD. She underwent 3 V CABG on 02/24/17 (LIMA to LAD, SVG to PDA, SVG to OM1). She had post op atrial fib and converted to sinus on amiodarone prior to discharge. Several episodes of syncope post discharge and she was felt to be hypovolemic. Her Norvasc and Lasix was stopped and Coreg dosage was reduced. Cardiac event monitor was normal. Symptoms resolved. Echo 02/17/17 with LVEF=55-60%, mild MR.   She is here today for follow up. The patient denies any chest pain, dyspnea, palpitations, lower extremity edema, orthopnea, PND, dizziness, near syncope or syncope. She finished the cardiac rehab program in [place].   Primary Care Physician: Ronita Hipps, MD   Past Medical History:  Diagnosis Date  . CAD in native artery    a. s/p CABGx3 in 01/2017.  Marland Kitchen Chronic diastolic CHF (congestive heart failure) ([place])   . Diabetes mellitus ([place])   . Diabetic neuropathy ([place])   . History of kidney stones    history of stones  . Hypertension   . Hypothyroidism   . NSTEMI (non-ST elevated myocardial infarction) ([place])   . Orthostatic hypotension   . Postoperative atrial fibrillation ([place])    a. after CABG 01/2017.  Marland Kitchen TIA (transient ischemic attack)     Past Surgical History:  Procedure Laterality Date  . ABDOMINAL HYSTERECTOMY    . CORONARY ARTERY BYPASS GRAFT N/A 02/24/2017   Procedure: CORONARY ARTERY BYPASS GRAFTING (CABG) USING LEFT INTERNAL MAMMARY ARTERY TO LAD AND ENDOSCOPICALLY HARVESTED GREATER SAPHENOUS VEIN TO PDA AND TO OM1.;  Surgeon: Grace Isaac, MD;  Location: [place];  Service: Open Heart Surgery;  Laterality: N/A;  . ENDOVEIN HARVEST OF GREATER SAPHENOUS VEIN Right  02/24/2017   Procedure: ENDOVEIN HARVEST OF GREATER SAPHENOUS VEIN;  Surgeon: Grace Isaac, MD;  Location: [place];  Service: Open Heart Surgery;  Laterality: Right;  . LAPAROSCOPIC GASTRIC BANDING    . LEFT HEART CATH AND CORONARY ANGIOGRAPHY N/A 02/18/2017   Procedure: LEFT HEART CATH AND CORONARY ANGIOGRAPHY;  Surgeon: Leonie Man, MD;  Location: [place] [facility];  Service: Cardiovascular;  Laterality: N/A;  . TEE WITHOUT CARDIOVERSION N/A 02/24/2017   Procedure: TRANSESOPHAGEAL ECHOCARDIOGRAM (TEE);  Surgeon: Grace Isaac, MD;  Location: [place];  Service: Open Heart Surgery;  Laterality: N/A;    Current Outpatient Medications  Medication Sig Dispense Refill  . acetaminophen (TYLENOL) 325 MG tablet Take 2 tablets (650 mg total) by mouth every 6 (six) hours as needed for mild pain.    Marland Kitchen aspirin EC 81 MG tablet Take 1 tablet (81 mg total) by mouth daily. 90 tablet 3  . carvedilol (COREG) 12.5 MG tablet Take 1 tablet (12.5 mg total) by mouth 2 (two) times daily. 60 tablet 3  . cholecalciferol (VITAMIN D) 1000 units tablet Take 3,000 Units 2 (two) times daily by mouth.    . clopidogrel (PLAVIX) 75 MG tablet Take 1 tablet (75 mg total) by mouth daily. 90 tablet 3  . co-enzyme Q-10 30 MG capsule Take 30 mg daily by mouth.    Marland Kitchen glipiZIDE (GLUCOTROL) 10 MG tablet Take 10 mg 2 (two) times daily by mouth.    Marland Kitchen  Glucosamine-Chondroit-Vit C-Mn (GLUCOSAMINE CHONDR 500 COMPLEX PO) Take 1 tablet 2 (two) times daily by mouth.    Javier Docker Oil 1000 MG CAPS Take 1,000 mg daily by mouth.    . levothyroxine (SYNTHROID, LEVOTHROID) 175 MCG tablet Take 175 mcg daily before breakfast by mouth.    Marland Kitchen lisinopril (PRINIVIL,ZESTRIL) 5 MG tablet Take 1 tablet (5 mg total) by mouth daily. 30 tablet 3  . MELATONIN PO Take 10 mg at bedtime by mouth.    . metFORMIN (GLUCOPHAGE) 1000 MG tablet Take 1,000 mg 2 (two) times daily by mouth.    . Multiple Vitamin (MULTIVITAMIN WITH MINERALS) TABS tablet Take 1  tablet daily by mouth.    Glory Rosebush DELICA LANCETS FINE MISC USE TO CHECK BLOOD SUGAR DAILY  3  . atorvastatin (LIPITOR) 40 MG tablet Take 1 tablet (40 mg total) by mouth daily. 90 tablet 3   No current facility-administered medications for this visit.     Allergies  Allergen Reactions  . Sulfa Antibiotics Nausea And Vomiting  . Shellfish Allergy Other (See Comments)    UNKNOWN REACTION.  Allergy found on ALLERGY TEST.  . Aggrenox [Aspirin-Dipyridamole Er] Other (See Comments)    Redness in eyes, takes aspirin 325 at home at baseline    Social History   Socioeconomic History  . Marital status: Single    Spouse name: Not on file  . Number of children: Not on file  . Years of education: Not on file  . Highest education level: Not on file  Occupational History  . Not on file  Social Needs  . Financial resource strain: Not on file  . Food insecurity:    Worry: Not on file    Inability: Not on file  . Transportation needs:    Medical: Not on file    Non-medical: Not on file  Tobacco Use  . Smoking status: Former Smoker    Types: Cigarettes    Start date: 1967    Last attempt to quit: 1975    Years since quitting: 44.2  . Smokeless tobacco: Never Used  Substance and Sexual Activity  . Alcohol use: No    Frequency: Never  . Drug use: No  . Sexual activity: Not on file  Lifestyle  . Physical activity:    Days per week: Not on file    Minutes per session: Not on file  . Stress: Not on file  Relationships  . Social connections:    Talks on phone: Not on file    Gets together: Not on file    Attends religious service: Not on file    Active member of club or organization: Not on file    Attends meetings of clubs or organizations: Not on file    Relationship status: Not on file  . Intimate partner violence:    Fear of current or ex partner: Not on file    Emotionally abused: Not on file    Physically abused: Not on file    Forced sexual activity: Not on file    Other Topics Concern  . Not on file  Social History Narrative  . Not on file    Family History  Problem Relation Age of Onset  . Heart disease Mother 78       CABG  . CVA Father     Review of Systems:  As stated in the HPI and otherwise negative.   BP 136/72   Pulse 65   Ht 5\' 3"  (1.6 m)  Wt 198 lb 6.4 oz (90 kg)   SpO2 98%   BMI 35.14 kg/m   Physical Examination: General: Well developed, well nourished, NAD  HEENT: OP clear, mucus membranes moist  SKIN: warm, dry. No rashes. Neuro: No focal deficits  Musculoskeletal: Muscle strength 5/5 all ext  Psychiatric: Mood and affect normal  Neck: No JVD, no carotid bruits, no thyromegaly, no lymphadenopathy.  Lungs:Clear bilaterally, no wheezes, rhonci, crackles Cardiovascular: Regular rate and rhythm. No murmurs, gallops or rubs. Abdomen:Soft. Bowel sounds present. Non-tender.  Extremities: No lower extremity edema. Pulses are 2 + in the bilateral DP/PT.  EKG:  EKG is not ordered today. The ekg ordered today demonstrates   Recent Labs: 02/19/2017: ALT 20 03/01/2017: TSH 0.561 03/28/2017: BUN 16; Creatinine, Ser 1.16; Hemoglobin 13.1; Magnesium 1.8; Platelets 486; Potassium 4.8; Sodium 139   Lipid Panel    Component Value Date/Time   CHOL 125 02/17/2017 0042   TRIG 358 (H) 02/17/2017 0042   HDL 30 (L) 02/17/2017 0042   CHOLHDL 4.2 02/17/2017 0042   VLDL 72 (H) 02/17/2017 0042   LDLCALC 23 02/17/2017 0042     Wt Readings from Last 3 Encounters:  06/23/17 198 lb 6.4 oz (90 kg)  05/12/17 197 lb 1.9 oz (89.4 kg)  04/14/17 203 lb 9.6 oz (92.4 kg)     Other studies Reviewed: Additional studies/ records that were reviewed today include: . Review of the above records demonstrates:    Assessment and Plan:   1. CAD s/p CABG without angina: She is doing well post CABG in November 2018.  No chest pain. Will continue ASA, statin, Plavix and beta blocker. Will lower Lipitor to 40 mg daily.   2. Post op atrial  fibrillation: no recurrence. Sinus today. Will stop amiodarone.   3. Chronic diastolic CHF: Weight is stable today. She is off of Lasix.   Current medicines are reviewed at length with the patient today.  The patient does not have concerns regarding medicines.  The following changes have been made:  no change  Labs/ tests ordered today include:  No orders of the defined types were placed in this encounter.    Disposition:   FU with me in 6 months   Signed, Lauree Chandler, MD 06/23/2017 3:51 PM    [facility] [place], [place], [place]  [address] Phone: [phone]; Fax: [phone]

## 2017-06-23 NOTE — Patient Instructions (Addendum)
Medication Instructions:  Your physician has recommended you make the following change in your medication:  Stop amiodarone Decrease atorvastatin to 40 mg by mouth daily.    Labwork: none  Testing/Procedures: none  Follow-Up: Your physician recommends that you schedule a follow-up appointment in: 6 months. Scheduled for September 25,2019 at 2:40    Any Other Special Instructions Will Be Listed Below (If Applicable).     If you need a refill on your cardiac medications before your next appointment, please call your pharmacy.

## 2017-07-03 DIAGNOSIS — R300 Dysuria: Secondary | ICD-10-CM | POA: Diagnosis not present

## 2017-07-03 DIAGNOSIS — Z6833 Body mass index (BMI) 33.0-33.9, adult: Secondary | ICD-10-CM | POA: Diagnosis not present

## 2017-07-04 DIAGNOSIS — R300 Dysuria: Secondary | ICD-10-CM | POA: Diagnosis not present

## 2017-08-26 DIAGNOSIS — Z6834 Body mass index (BMI) 34.0-34.9, adult: Secondary | ICD-10-CM | POA: Diagnosis not present

## 2017-08-26 DIAGNOSIS — R252 Cramp and spasm: Secondary | ICD-10-CM | POA: Diagnosis not present

## 2017-12-24 ENCOUNTER — Encounter: Payer: Self-pay | Admitting: Cardiovascular Disease

## 2017-12-24 ENCOUNTER — Ambulatory Visit: Payer: Medicare HMO | Admitting: Cardiovascular Disease

## 2017-12-24 VITALS — BP 138/88 | HR 80 | Ht 63.0 in | Wt 190.8 lb

## 2017-12-24 DIAGNOSIS — I25810 Atherosclerosis of coronary artery bypass graft(s) without angina pectoris: Secondary | ICD-10-CM

## 2017-12-24 DIAGNOSIS — I480 Paroxysmal atrial fibrillation: Secondary | ICD-10-CM

## 2017-12-24 DIAGNOSIS — I5032 Chronic diastolic (congestive) heart failure: Secondary | ICD-10-CM | POA: Diagnosis not present

## 2017-12-24 MED ORDER — CARVEDILOL 12.5 MG PO TABS: 12.5000 mg | ORAL_TABLET | Freq: Two times a day (BID) | ORAL | 11 refills | Status: DC

## 2017-12-24 NOTE — Progress Notes (Signed)
Chief Complaint  Patient presents with  . Follow-up    CAD   History of Present Illness: 72 yo female with history of chronic diastolic CHF, DM, HTN and CAD s/p CABG who is here today for cardiac follow up. She was admitted to [facility] in November 2018 with a NSTEMI and found to have severe three vessel CAD. She underwent 3 V CABG on 02/24/17 (LIMA to LAD, SVG to PDA, SVG to OM1). She had post op atrial fib and converted to sinus on amiodarone prior to discharge. Several episodes of syncope post discharge and she was felt to be hypovolemic. Her Norvasc and Lasix was stopped and Coreg dosage was reduced. Cardiac event monitor was normal. Symptoms resolved. Echo 02/17/17 with LVEF=55-60%, mild MR.   She is here today for follow up. The patient denies any chest pain, dyspnea, palpitations, lower extremity edema, orthopnea, PND, dizziness, near syncope or syncope. Some tingling in her fingers and hands. She had some confusion after waking from a nap a few weeks ago. Gait instability and "wobbling" per husband.   Primary Care Physician: Ronita Hipps, MD  Past Medical History:  Diagnosis Date  . CAD in native artery    a. s/p CABGx3 in 01/2017.  Marland Kitchen Chronic diastolic CHF (congestive heart failure) ([place])   . Diabetes mellitus ([place])   . Diabetic neuropathy ([place])   . History of kidney stones    history of stones  . Hypertension   . Hypothyroidism   . NSTEMI (non-ST elevated myocardial infarction) ([place])   . Orthostatic hypotension   . Postoperative atrial fibrillation ([place])    a. after CABG 01/2017.  Marland Kitchen TIA (transient ischemic attack)     Past Surgical History:  Procedure Laterality Date  . ABDOMINAL HYSTERECTOMY    . CORONARY ARTERY BYPASS GRAFT N/A 02/24/2017   Procedure: CORONARY ARTERY BYPASS GRAFTING (CABG) USING LEFT INTERNAL MAMMARY ARTERY TO LAD AND ENDOSCOPICALLY HARVESTED GREATER SAPHENOUS VEIN TO PDA AND TO OM1.;  Surgeon: Grace Isaac, MD;  Location: [place];  Service: Open  Heart Surgery;  Laterality: N/A;  . ENDOVEIN HARVEST OF GREATER SAPHENOUS VEIN Right 02/24/2017   Procedure: ENDOVEIN HARVEST OF GREATER SAPHENOUS VEIN;  Surgeon: Grace Isaac, MD;  Location: [place];  Service: Open Heart Surgery;  Laterality: Right;  . LAPAROSCOPIC GASTRIC BANDING    . LEFT HEART CATH AND CORONARY ANGIOGRAPHY N/A 02/18/2017   Procedure: LEFT HEART CATH AND CORONARY ANGIOGRAPHY;  Surgeon: Leonie Man, MD;  Location: [place] [facility];  Service: Cardiovascular;  Laterality: N/A;  . TEE WITHOUT CARDIOVERSION N/A 02/24/2017   Procedure: TRANSESOPHAGEAL ECHOCARDIOGRAM (TEE);  Surgeon: Grace Isaac, MD;  Location: [place];  Service: Open Heart Surgery;  Laterality: N/A;    Current Outpatient Medications  Medication Sig Dispense Refill  . acetaminophen (TYLENOL) 325 MG tablet Take 2 tablets (650 mg total) by mouth every 6 (six) hours as needed for mild pain.    Marland Kitchen aspirin EC 81 MG tablet Take 1 tablet (81 mg total) by mouth daily. 90 tablet 3  . atorvastatin (LIPITOR) 40 MG tablet Take 1 tablet (40 mg total) by mouth daily. 90 tablet 3  . carvedilol (COREG) 12.5 MG tablet Take 1 tablet (12.5 mg total) by mouth 2 (two) times daily. 60 tablet 11  . cholecalciferol (VITAMIN D) 1000 units tablet Take 3,000 Units 2 (two) times daily by mouth.    . clopidogrel (PLAVIX) 75 MG tablet Take 1 tablet (75 mg total) by  mouth daily. 90 tablet 3  . co-enzyme Q-10 30 MG capsule Take 30 mg daily by mouth.    Marland Kitchen glipiZIDE (GLUCOTROL) 10 MG tablet Take 10 mg 2 (two) times daily by mouth.    . Glucosamine-Chondroit-Vit C-Mn (GLUCOSAMINE CHONDR 500 COMPLEX PO) Take 1 tablet 2 (two) times daily by mouth.    Javier Docker Oil 1000 MG CAPS Take 1,000 mg daily by mouth.    . levothyroxine (SYNTHROID, LEVOTHROID) 175 MCG tablet Take 175 mcg daily before breakfast by mouth.    Marland Kitchen lisinopril (PRINIVIL,ZESTRIL) 5 MG tablet Take 1 tablet (5 mg total) by mouth daily. 30 tablet 3  . MELATONIN PO Take 10 mg  at bedtime by mouth.    . metFORMIN (GLUCOPHAGE) 1000 MG tablet Take 1,000 mg 2 (two) times daily by mouth.    . Multiple Vitamin (MULTIVITAMIN WITH MINERALS) TABS tablet Take 1 tablet daily by mouth.    Glory Rosebush DELICA LANCETS FINE MISC USE TO CHECK BLOOD SUGAR DAILY  3   No current facility-administered medications for this visit.     Allergies  Allergen Reactions  . Sulfa Antibiotics Nausea And Vomiting  . Shellfish Allergy Other (See Comments)    UNKNOWN REACTION.  Allergy found on ALLERGY TEST.  . Aggrenox [Aspirin-Dipyridamole Er] Other (See Comments)    Redness in eyes, takes aspirin 325 at home at baseline    Social History   Socioeconomic History  . Marital status: Single    Spouse name: Not on file  . Number of children: Not on file  . Years of education: Not on file  . Highest education level: Not on file  Occupational History  . Not on file  Social Needs  . Financial resource strain: Not on file  . Food insecurity:    Worry: Not on file    Inability: Not on file  . Transportation needs:    Medical: Not on file    Non-medical: Not on file  Tobacco Use  . Smoking status: Former Smoker    Types: Cigarettes    Start date: 1967    Last attempt to quit: 1975    Years since quitting: 44.7  . Smokeless tobacco: Never Used  Substance and Sexual Activity  . Alcohol use: No    Frequency: Never  . Drug use: No  . Sexual activity: Not on file  Lifestyle  . Physical activity:    Days per week: Not on file    Minutes per session: Not on file  . Stress: Not on file  Relationships  . Social connections:    Talks on phone: Not on file    Gets together: Not on file    Attends religious service: Not on file    Active member of club or organization: Not on file    Attends meetings of clubs or organizations: Not on file    Relationship status: Not on file  . Intimate partner violence:    Fear of current or ex partner: Not on file    Emotionally abused: Not on  file    Physically abused: Not on file    Forced sexual activity: Not on file  Other Topics Concern  . Not on file  Social History Narrative  . Not on file    Family History  Problem Relation Age of Onset  . Heart disease Mother 58       CABG  . CVA Father     Review of Systems:  As stated in  the HPI and otherwise negative.   BP 138/88   Pulse 80   Ht 5\' 3"  (1.6 m)   Wt 190 lb 12.8 oz (86.5 kg)   LMP  (LMP Unknown)   SpO2 98%   BMI 33.80 kg/m   Physical Examination:  General: Well developed, well nourished, NAD  HEENT: OP clear, mucus membranes moist  SKIN: warm, dry. No rashes. Neuro: No focal deficits  Musculoskeletal: Muscle strength 5/5 all ext  Psychiatric: Mood and affect normal  Neck: No JVD, no carotid bruits, no thyromegaly, no lymphadenopathy.  Lungs:Clear bilaterally, no wheezes, rhonci, crackles Cardiovascular: Regular rate and rhythm. No murmurs, gallops or rubs. Abdomen:Soft. Bowel sounds present. Non-tender.  Extremities: No lower extremity edema. Pulses are 2 + in the bilateral DP/PT.  EKG:  EKG is not ordered today. The ekg ordered today demonstrates Sinus, rate80 bpm. PVC. PAC. Old inferior Q waves. Lateral T wave inversions.   Recent Labs: 02/19/2017: ALT 20 03/01/2017: TSH 0.561 03/28/2017: BUN 16; Creatinine, Ser 1.16; Hemoglobin 13.1; Magnesium 1.8; Platelets 486; Potassium 4.8; Sodium 139   Lipid Panel    Component Value Date/Time   CHOL 125 02/17/2017 0042   TRIG 358 (H) 02/17/2017 0042   HDL 30 (L) 02/17/2017 0042   CHOLHDL 4.2 02/17/2017 0042   VLDL 72 (H) 02/17/2017 0042   LDLCALC 23 02/17/2017 0042     Wt Readings from Last 3 Encounters:  12/24/17 190 lb 12.8 oz (86.5 kg)  06/23/17 198 lb 6.4 oz (90 kg)  05/12/17 197 lb 1.9 oz (89.4 kg)     Other studies Reviewed: Additional studies/ records that were reviewed today include: . Review of the above records demonstrates:    Assessment and Plan:   1. CAD s/p CABG without  angina: She had CABG in November 2018.  She is doing well. No chest pain. Continue ASA, Plavix, statin and beta blocker.    2. Post op atrial fibrillation: No known recurrence. No palpitations at home. She is in sinus today. She has had numbness and tingling in her hands and gait instability. Will arrange 30 day event monitor to exclude atrial fib. Wlll continue beta blocker. I have asked her to follow up with Dr. Helene Kelp to discuss her neurological issues.   3. Chronic diastolic CHF: Weight is stable today. No evidence of volume overload today.   Current medicines are reviewed at length with the patient today.  The patient does not have concerns regarding medicines.  The following changes have been made:  no change  Labs/ tests ordered today include:   Orders Placed This Encounter  Procedures  . CARDIAC EVENT MONITOR  . EKG 12-Lead   Disposition:   FU with me in 6 months  Signed, Lauree Chandler, MD 12/24/2017 4:01 PM    [facility] [address], [place], [place]  [address] Phone: [phone]; Fax: [phone]

## 2017-12-24 NOTE — Patient Instructions (Signed)
Medication Instructions:  Your physician recommends that you continue on your current medications as directed. Please refer to the Current Medication list given to you today.   Labwork: none  Testing/Procedures: Your physician has recommended that you wear an event monitor. Event monitors are medical devices that record the heart's electrical activity. Doctors most often [place] these monitors to diagnose arrhythmias. Arrhythmias are problems with the speed or rhythm of the heartbeat. The monitor is a small, portable device. You can wear one while you do your normal daily activities. This is usually used to diagnose what is causing palpitations/syncope (passing out).    Follow-Up: Your physician recommends that you schedule a follow-up appointment in: 6 months. Scheduled for March 26,2020 at 3:00    Any Other Special Instructions Will Be Listed Below (If Applicable).     If you need a refill on your cardiac medications before your next appointment, please call your pharmacy.

## 2017-12-31 ENCOUNTER — Ambulatory Visit (INDEPENDENT_AMBULATORY_CARE_PROVIDER_SITE_OTHER): Payer: Medicare HMO

## 2017-12-31 DIAGNOSIS — I480 Paroxysmal atrial fibrillation: Secondary | ICD-10-CM | POA: Diagnosis not present

## 2018-01-04 DIAGNOSIS — R300 Dysuria: Secondary | ICD-10-CM | POA: Diagnosis not present

## 2018-01-04 DIAGNOSIS — N3091 Cystitis, unspecified with hematuria: Secondary | ICD-10-CM | POA: Diagnosis not present

## 2018-01-31 DIAGNOSIS — I252 Old myocardial infarction: Secondary | ICD-10-CM | POA: Diagnosis not present

## 2018-01-31 DIAGNOSIS — R110 Nausea: Secondary | ICD-10-CM | POA: Diagnosis not present

## 2018-01-31 DIAGNOSIS — I10 Essential (primary) hypertension: Secondary | ICD-10-CM | POA: Diagnosis not present

## 2018-01-31 DIAGNOSIS — Z87891 Personal history of nicotine dependence: Secondary | ICD-10-CM | POA: Diagnosis not present

## 2018-01-31 DIAGNOSIS — W19XXXA Unspecified fall, initial encounter: Secondary | ICD-10-CM | POA: Diagnosis not present

## 2018-01-31 DIAGNOSIS — E119 Type 2 diabetes mellitus without complications: Secondary | ICD-10-CM | POA: Diagnosis not present

## 2018-01-31 DIAGNOSIS — Z951 Presence of aortocoronary bypass graft: Secondary | ICD-10-CM | POA: Diagnosis not present

## 2018-01-31 DIAGNOSIS — Z8673 Personal history of transient ischemic attack (TIA), and cerebral infarction without residual deficits: Secondary | ICD-10-CM | POA: Diagnosis not present

## 2018-01-31 DIAGNOSIS — R55 Syncope and collapse: Secondary | ICD-10-CM | POA: Diagnosis not present

## 2018-01-31 DIAGNOSIS — M1990 Unspecified osteoarthritis, unspecified site: Secondary | ICD-10-CM | POA: Diagnosis not present

## 2018-02-04 DIAGNOSIS — R69 Illness, unspecified: Secondary | ICD-10-CM | POA: Diagnosis not present

## 2018-02-04 DIAGNOSIS — Z23 Encounter for immunization: Secondary | ICD-10-CM | POA: Diagnosis not present

## 2018-02-04 DIAGNOSIS — Z6834 Body mass index (BMI) 34.0-34.9, adult: Secondary | ICD-10-CM | POA: Diagnosis not present

## 2018-02-04 DIAGNOSIS — R300 Dysuria: Secondary | ICD-10-CM | POA: Diagnosis not present

## 2018-02-05 ENCOUNTER — Telehealth: Payer: Self-pay | Admitting: *Deleted

## 2018-02-05 DIAGNOSIS — R300 Dysuria: Secondary | ICD-10-CM | POA: Diagnosis not present

## 2018-02-05 NOTE — Telephone Encounter (Signed)
I spoke with pt and reviewed monitor results with her. Pt reports she only wore monitor for a short time because patches were painful. They irritated her skin. She spoke with monitor company but reports "she did not get anywhere with them."  Does not know how long she actually wore monitor but thinks it was a couple of days. Had worn a different monitor in the past and did not have problems with that monitor. I told pt I would make Dr. Angelena Form aware and call her back if he wanted to make any changes.

## 2018-02-05 NOTE — Telephone Encounter (Signed)
-----   Message from Burnell Blanks, MD sent at 02/05/2018 11:49 AM EST ----- Fraser Din, Can we let her know that her monitor is ok? No atrial fib. Gerald Stabs

## 2018-02-05 NOTE — Telephone Encounter (Signed)
I can only see the days that she was wearing it. I am not sure any other company is going to have less painful patches. Gerald Stabs

## 2018-03-02 ENCOUNTER — Inpatient Hospital Stay (HOSPITAL_COMMUNITY)
Admission: AD | Admit: 2018-03-02 | Discharge: 2018-03-09 | DRG: 040 | Disposition: A | Discharge status: Rehabilitation Facility | Payer: Medicare HMO | Source: Other Acute Inpatient Hospital | Attending: Internal Medicine | Admitting: Internal Medicine

## 2018-03-02 ENCOUNTER — Encounter (HOSPITAL_COMMUNITY): Payer: Self-pay | Admitting: *Deleted

## 2018-03-02 ENCOUNTER — Inpatient Hospital Stay (HOSPITAL_COMMUNITY): Payer: Medicare HMO

## 2018-03-02 DIAGNOSIS — G8191 Hemiplegia, unspecified affecting right dominant side: Secondary | ICD-10-CM | POA: Diagnosis not present

## 2018-03-02 DIAGNOSIS — I252 Old myocardial infarction: Secondary | ICD-10-CM | POA: Diagnosis not present

## 2018-03-02 DIAGNOSIS — R0602 Shortness of breath: Secondary | ICD-10-CM | POA: Diagnosis not present

## 2018-03-02 DIAGNOSIS — E669 Obesity, unspecified: Secondary | ICD-10-CM | POA: Diagnosis not present

## 2018-03-02 DIAGNOSIS — I25810 Atherosclerosis of coronary artery bypass graft(s) without angina pectoris: Secondary | ICD-10-CM | POA: Diagnosis not present

## 2018-03-02 DIAGNOSIS — I10 Essential (primary) hypertension: Secondary | ICD-10-CM | POA: Diagnosis not present

## 2018-03-02 DIAGNOSIS — R9431 Abnormal electrocardiogram [ECG] [EKG]: Secondary | ICD-10-CM | POA: Diagnosis not present

## 2018-03-02 DIAGNOSIS — Z8673 Personal history of transient ischemic attack (TIA), and cerebral infarction without residual deficits: Secondary | ICD-10-CM

## 2018-03-02 DIAGNOSIS — Z87891 Personal history of nicotine dependence: Secondary | ICD-10-CM

## 2018-03-02 DIAGNOSIS — A419 Sepsis, unspecified organism: Secondary | ICD-10-CM | POA: Diagnosis not present

## 2018-03-02 DIAGNOSIS — I6340 Cerebral infarction due to embolism of unspecified cerebral artery: Secondary | ICD-10-CM | POA: Diagnosis not present

## 2018-03-02 DIAGNOSIS — I6501 Occlusion and stenosis of right vertebral artery: Secondary | ICD-10-CM | POA: Diagnosis not present

## 2018-03-02 DIAGNOSIS — I63512 Cerebral infarction due to unspecified occlusion or stenosis of left middle cerebral artery: Secondary | ICD-10-CM | POA: Diagnosis not present

## 2018-03-02 DIAGNOSIS — E1165 Type 2 diabetes mellitus with hyperglycemia: Secondary | ICD-10-CM | POA: Diagnosis present

## 2018-03-02 DIAGNOSIS — I63233 Cerebral infarction due to unspecified occlusion or stenosis of bilateral carotid arteries: Secondary | ICD-10-CM | POA: Diagnosis not present

## 2018-03-02 DIAGNOSIS — T50904A Poisoning by unspecified drugs, medicaments and biological substances, undetermined, initial encounter: Secondary | ICD-10-CM | POA: Diagnosis not present

## 2018-03-02 DIAGNOSIS — Z7951 Long term (current) use of inhaled steroids: Secondary | ICD-10-CM

## 2018-03-02 DIAGNOSIS — Z951 Presence of aortocoronary bypass graft: Secondary | ICD-10-CM | POA: Diagnosis not present

## 2018-03-02 DIAGNOSIS — E785 Hyperlipidemia, unspecified: Secondary | ICD-10-CM | POA: Diagnosis present

## 2018-03-02 DIAGNOSIS — N3020 Other chronic cystitis without hematuria: Secondary | ICD-10-CM | POA: Diagnosis not present

## 2018-03-02 DIAGNOSIS — I5032 Chronic diastolic (congestive) heart failure: Secondary | ICD-10-CM | POA: Diagnosis not present

## 2018-03-02 DIAGNOSIS — R29726 NIHSS score 26: Secondary | ICD-10-CM | POA: Diagnosis present

## 2018-03-02 DIAGNOSIS — G8194 Hemiplegia, unspecified affecting left nondominant side: Secondary | ICD-10-CM | POA: Diagnosis not present

## 2018-03-02 DIAGNOSIS — I110 Hypertensive heart disease with heart failure: Secondary | ICD-10-CM | POA: Diagnosis present

## 2018-03-02 DIAGNOSIS — I2510 Atherosclerotic heart disease of native coronary artery without angina pectoris: Secondary | ICD-10-CM | POA: Diagnosis not present

## 2018-03-02 DIAGNOSIS — R6520 Severe sepsis without septic shock: Secondary | ICD-10-CM | POA: Diagnosis not present

## 2018-03-02 DIAGNOSIS — Z882 Allergy status to sulfonamides status: Secondary | ICD-10-CM

## 2018-03-02 DIAGNOSIS — N201 Calculus of ureter: Secondary | ICD-10-CM | POA: Diagnosis not present

## 2018-03-02 DIAGNOSIS — R6521 Severe sepsis with septic shock: Secondary | ICD-10-CM | POA: Diagnosis not present

## 2018-03-02 DIAGNOSIS — E876 Hypokalemia: Secondary | ICD-10-CM | POA: Diagnosis present

## 2018-03-02 DIAGNOSIS — I4891 Unspecified atrial fibrillation: Secondary | ICD-10-CM | POA: Diagnosis not present

## 2018-03-02 DIAGNOSIS — Z91013 Allergy to seafood: Secondary | ICD-10-CM

## 2018-03-02 DIAGNOSIS — R0682 Tachypnea, not elsewhere classified: Secondary | ICD-10-CM

## 2018-03-02 DIAGNOSIS — I340 Nonrheumatic mitral (valve) insufficiency: Secondary | ICD-10-CM | POA: Diagnosis not present

## 2018-03-02 DIAGNOSIS — E872 Acidosis: Secondary | ICD-10-CM | POA: Diagnosis not present

## 2018-03-02 DIAGNOSIS — I6389 Other cerebral infarction: Secondary | ICD-10-CM | POA: Diagnosis not present

## 2018-03-02 DIAGNOSIS — R531 Weakness: Secondary | ICD-10-CM | POA: Diagnosis present

## 2018-03-02 DIAGNOSIS — R29810 Facial weakness: Secondary | ICD-10-CM | POA: Diagnosis not present

## 2018-03-02 DIAGNOSIS — I672 Cerebral atherosclerosis: Secondary | ICD-10-CM | POA: Diagnosis not present

## 2018-03-02 DIAGNOSIS — G460 Middle cerebral artery syndrome: Secondary | ICD-10-CM | POA: Diagnosis not present

## 2018-03-02 DIAGNOSIS — E039 Hypothyroidism, unspecified: Secondary | ICD-10-CM | POA: Diagnosis present

## 2018-03-02 DIAGNOSIS — R402222 Coma scale, best verbal response, incomprehensible words, at arrival to emergency department: Secondary | ICD-10-CM | POA: Diagnosis present

## 2018-03-02 DIAGNOSIS — I951 Orthostatic hypotension: Secondary | ICD-10-CM | POA: Diagnosis not present

## 2018-03-02 DIAGNOSIS — D72829 Elevated white blood cell count, unspecified: Secondary | ICD-10-CM | POA: Diagnosis not present

## 2018-03-02 DIAGNOSIS — Z823 Family history of stroke: Secondary | ICD-10-CM

## 2018-03-02 DIAGNOSIS — I639 Cerebral infarction, unspecified: Secondary | ICD-10-CM | POA: Diagnosis not present

## 2018-03-02 DIAGNOSIS — I6523 Occlusion and stenosis of bilateral carotid arteries: Secondary | ICD-10-CM | POA: Diagnosis not present

## 2018-03-02 DIAGNOSIS — R29715 NIHSS score 15: Secondary | ICD-10-CM | POA: Diagnosis not present

## 2018-03-02 DIAGNOSIS — R402132 Coma scale, eyes open, to sound, at arrival to emergency department: Secondary | ICD-10-CM | POA: Diagnosis present

## 2018-03-02 DIAGNOSIS — Z87442 Personal history of urinary calculi: Secondary | ICD-10-CM | POA: Diagnosis not present

## 2018-03-02 DIAGNOSIS — D62 Acute posthemorrhagic anemia: Secondary | ICD-10-CM | POA: Diagnosis not present

## 2018-03-02 DIAGNOSIS — R404 Transient alteration of awareness: Secondary | ICD-10-CM | POA: Diagnosis not present

## 2018-03-02 DIAGNOSIS — R402352 Coma scale, best motor response, localizes pain, at arrival to emergency department: Secondary | ICD-10-CM | POA: Diagnosis present

## 2018-03-02 DIAGNOSIS — N130 Hydronephrosis with ureteropelvic junction obstruction: Secondary | ICD-10-CM | POA: Diagnosis not present

## 2018-03-02 DIAGNOSIS — I213 ST elevation (STEMI) myocardial infarction of unspecified site: Secondary | ICD-10-CM | POA: Diagnosis not present

## 2018-03-02 DIAGNOSIS — N179 Acute kidney failure, unspecified: Secondary | ICD-10-CM | POA: Diagnosis not present

## 2018-03-02 DIAGNOSIS — F329 Major depressive disorder, single episode, unspecified: Secondary | ICD-10-CM | POA: Diagnosis not present

## 2018-03-02 DIAGNOSIS — N132 Hydronephrosis with renal and ureteral calculous obstruction: Secondary | ICD-10-CM | POA: Diagnosis not present

## 2018-03-02 DIAGNOSIS — Z7984 Long term (current) use of oral hypoglycemic drugs: Secondary | ICD-10-CM

## 2018-03-02 DIAGNOSIS — E1140 Type 2 diabetes mellitus with diabetic neuropathy, unspecified: Secondary | ICD-10-CM | POA: Diagnosis present

## 2018-03-02 DIAGNOSIS — R4182 Altered mental status, unspecified: Secondary | ICD-10-CM | POA: Diagnosis not present

## 2018-03-02 DIAGNOSIS — Z8249 Family history of ischemic heart disease and other diseases of the circulatory system: Secondary | ICD-10-CM

## 2018-03-02 DIAGNOSIS — B009 Herpesviral infection, unspecified: Secondary | ICD-10-CM | POA: Diagnosis not present

## 2018-03-02 DIAGNOSIS — E1169 Type 2 diabetes mellitus with other specified complication: Secondary | ICD-10-CM | POA: Diagnosis not present

## 2018-03-02 DIAGNOSIS — J189 Pneumonia, unspecified organism: Secondary | ICD-10-CM | POA: Diagnosis not present

## 2018-03-02 DIAGNOSIS — R0989 Other specified symptoms and signs involving the circulatory and respiratory systems: Secondary | ICD-10-CM | POA: Diagnosis not present

## 2018-03-02 DIAGNOSIS — Z886 Allergy status to analgesic agent status: Secondary | ICD-10-CM

## 2018-03-02 DIAGNOSIS — N136 Pyonephrosis: Secondary | ICD-10-CM | POA: Diagnosis not present

## 2018-03-02 DIAGNOSIS — N12 Tubulo-interstitial nephritis, not specified as acute or chronic: Secondary | ICD-10-CM

## 2018-03-02 DIAGNOSIS — R7309 Other abnormal glucose: Secondary | ICD-10-CM | POA: Diagnosis not present

## 2018-03-02 DIAGNOSIS — Z6832 Body mass index (BMI) 32.0-32.9, adult: Secondary | ICD-10-CM

## 2018-03-02 DIAGNOSIS — E119 Type 2 diabetes mellitus without complications: Secondary | ICD-10-CM | POA: Diagnosis not present

## 2018-03-02 DIAGNOSIS — Z7982 Long term (current) use of aspirin: Secondary | ICD-10-CM

## 2018-03-02 DIAGNOSIS — Z79899 Other long term (current) drug therapy: Secondary | ICD-10-CM

## 2018-03-02 DIAGNOSIS — I081 Rheumatic disorders of both mitral and tricuspid valves: Secondary | ICD-10-CM | POA: Diagnosis not present

## 2018-03-02 DIAGNOSIS — N139 Obstructive and reflux uropathy, unspecified: Secondary | ICD-10-CM | POA: Diagnosis not present

## 2018-03-02 LAB — GLUCOSE, CAPILLARY: Glucose-Capillary: 248 mg/dL — ABNORMAL HIGH (ref 70–99)

## 2018-03-02 LAB — CBC WITH DIFFERENTIAL/PLATELET
Abs Immature Granulocytes: 0.21 10*3/uL — ABNORMAL HIGH (ref 0.00–0.07)
BASOS PCT: 0 %
Basophils Absolute: 0 10*3/uL (ref 0.0–0.1)
Eosinophils Absolute: 0.1 10*3/uL (ref 0.0–0.5)
Eosinophils Relative: 0 %
HCT: 41.5 % (ref 36.0–46.0)
Hemoglobin: 13.9 g/dL (ref 12.0–15.0)
Immature Granulocytes: 1 %
LYMPHS ABS: 1.2 10*3/uL (ref 0.7–4.0)
Lymphocytes Relative: 5 %
MCH: 30.4 pg (ref 26.0–34.0)
MCHC: 33.5 g/dL (ref 30.0–36.0)
MCV: 90.8 fL (ref 80.0–100.0)
Monocytes Absolute: 1 10*3/uL (ref 0.1–1.0)
Monocytes Relative: 4 %
Neutro Abs: 20.1 10*3/uL — ABNORMAL HIGH (ref 1.7–7.7)
Neutrophils Relative %: 90 %
Platelets: 352 10*3/uL (ref 150–400)
RBC: 4.57 MIL/uL (ref 3.87–5.11)
RDW: 12.2 % (ref 11.5–15.5)
WBC: 22.6 10*3/uL — ABNORMAL HIGH (ref 4.0–10.5)
nRBC: 0 % (ref 0.0–0.2)

## 2018-03-02 LAB — COMPREHENSIVE METABOLIC PANEL
ALK PHOS: 41 U/L (ref 38–126)
ALT: 19 U/L (ref 0–44)
AST: 32 U/L (ref 15–41)
Albumin: 3.3 g/dL — ABNORMAL LOW (ref 3.5–5.0)
Anion gap: 18 — ABNORMAL HIGH (ref 5–15)
BILIRUBIN TOTAL: 0.9 mg/dL (ref 0.3–1.2)
BUN: 22 mg/dL (ref 8–23)
CO2: 22 mmol/L (ref 22–32)
Calcium: 8.6 mg/dL — ABNORMAL LOW (ref 8.9–10.3)
Chloride: 96 mmol/L — ABNORMAL LOW (ref 98–111)
Creatinine, Ser: 1.87 mg/dL — ABNORMAL HIGH (ref 0.44–1.00)
GFR calc Af Amer: 31 mL/min — ABNORMAL LOW (ref >60)
GFR calc non Af Amer: 27 mL/min — ABNORMAL LOW (ref >60)
Glucose, Bld: 224 mg/dL — ABNORMAL HIGH (ref 70–99)
Potassium: 2.9 mmol/L — ABNORMAL LOW (ref 3.5–5.1)
Sodium: 136 mmol/L (ref 135–145)
Total Protein: 6.9 g/dL (ref 6.5–8.1)

## 2018-03-02 LAB — LACTIC ACID, PLASMA: Lactic Acid, Venous: 4.1 mmol/L (ref 0.5–1.9)

## 2018-03-02 MED ORDER — INSULIN ASPART 100 UNIT/ML ~~LOC~~ SOLN
1.0000 [IU] | SUBCUTANEOUS | Status: DC
  Administered 2018-03-02: 3 [IU] via SUBCUTANEOUS
  Administered 2018-03-03 (×2): 2 [IU] via SUBCUTANEOUS

## 2018-03-02 MED ORDER — CHLORHEXIDINE GLUCONATE 0.12 % MT SOLN
15.0000 mL | Freq: Two times a day (BID) | OROMUCOSAL | Status: DC
  Administered 2018-03-03 – 2018-03-09 (×12): 15 mL via OROMUCOSAL
  Filled 2018-03-02 (×11): qty 15

## 2018-03-02 MED ORDER — ENOXAPARIN SODIUM 30 MG/0.3ML ~~LOC~~ SOLN
30.0000 mg | SUBCUTANEOUS | Status: DC
  Administered 2018-03-02 – 2018-03-03 (×2): 30 mg via SUBCUTANEOUS
  Filled 2018-03-02 (×2): qty 0.3

## 2018-03-02 MED ORDER — LEVOTHYROXINE SODIUM 100 MCG IV SOLR
87.5000 ug | Freq: Every day | INTRAVENOUS | Status: DC
  Administered 2018-03-03: 87.5 ug via INTRAVENOUS
  Filled 2018-03-02 (×2): qty 5

## 2018-03-02 MED ORDER — CARVEDILOL 12.5 MG PO TABS
12.5000 mg | ORAL_TABLET | Freq: Two times a day (BID) | ORAL | Status: DC
  Administered 2018-03-04 – 2018-03-09 (×12): 12.5 mg via ORAL
  Filled 2018-03-02 (×12): qty 1

## 2018-03-02 MED ORDER — POTASSIUM CHLORIDE 10 MEQ/100ML IV SOLN
10.0000 meq | INTRAVENOUS | Status: AC
  Administered 2018-03-03 (×4): 10 meq via INTRAVENOUS
  Filled 2018-03-02 (×4): qty 100

## 2018-03-02 MED ORDER — ASPIRIN 81 MG PO CHEW: 81.0000 mg | CHEWABLE_TABLET | Freq: Every day | ORAL | Status: DC

## 2018-03-02 MED ORDER — SODIUM CHLORIDE 0.9 % IV SOLN
INTRAVENOUS | Status: DC
  Administered 2018-03-02 – 2018-03-03 (×3): via INTRAVENOUS

## 2018-03-02 MED ORDER — ORAL CARE MOUTH RINSE
15.0000 mL | Freq: Two times a day (BID) | OROMUCOSAL | Status: DC
  Administered 2018-03-03 – 2018-03-05 (×5): 15 mL via OROMUCOSAL

## 2018-03-02 MED ORDER — SODIUM CHLORIDE 0.9 % IV SOLN
1.0000 g | INTRAVENOUS | Status: DC
  Administered 2018-03-02 – 2018-03-06 (×4): 1 g via INTRAVENOUS
  Filled 2018-03-02 (×5): qty 10

## 2018-03-02 NOTE — Progress Notes (Signed)
eLink Physician-Brief Progress Note Patient Name: Theresa Ray DOB: 09/09/1945 MRN: [id number]   Date of Service  03/02/2018  HPI/Events of Note  Multiple issues: 1. Lactic Acid = 8.0 --> 5.8 --> 4.1. K+ = 2.9 and Creatinine = 1.87. Lactic acid continues to clear.   eICU Interventions  Will order: 1. Replace K+.      Intervention Category Major Interventions: Electrolyte abnormality - evaluation and management  Sommer,Steven [name] 03/02/2018, 11:54 PM

## 2018-03-02 NOTE — H&P (Addendum)
PULMONARY / CRITICAL CARE MEDICINE   NAME:  Theresa Ray, MRN:  [id number], DOB:  Sep 05, 1945, LOS: 0 ADMISSION DATE:  03/02/2018, CONSULTATION DATE: 03/02/2018 REFERRING MD: [facility], CHIEF COMPLAINT: Altered mental status  BRIEF HISTORY:    Ms. Theresa Ray is a 72 year old lady with history of CAD and TIAs transferred from [facility] due to altered mental status and left-sided weakness was found to have ureteral obstruction status post stent  HISTORY OF PRESENT ILLNESS   Ms. Theresa Ray is a 72 year old lady with history of CAD and TIAs transferred from [facility] due to altered mental status and left-sided weakness was found to have ureteral obstruction status post stent.  As per husband patient was well until 4:00 in the morning when she went to sleep and when he checked on her at around 8:15 AM patient was not able to stand and she had some right-sided gaze and left-sided weakness EMS were called and a code stroke was initiated and patient was [facility] at that time patient did not receive TPA due to patient was out of the window but CTA was done which rule out any large vessel occlusion.  On exam at that time patient had left-sided weakness and were not able to assess for cranial nerves or cerebellar exam.  Due to her leukocytosis CT abdomen was done and was showing left ureteric obstruction secondary to kidney stone and she was taken to the OR for double-J stent.  Patient was transferred to our hospital due to altered mental status.  Upon arrival to the ICU patient was assessed she was very lethargic not able to participate in exam or give any history and most of the history was taken from her husband at bedside.  As per husband patient has a fever since yesterday and she did have multiple episodes in the past where she had syncopal attacks and sometimes she has these episodes of gazing and forgetfulness.   SIGNIFICANT PAST MEDICAL HISTORY   -Coronary artery  disease status post CABG x3 -Hypertension -Hyperlipidemia -TIAs -Diabetes mellitus   SIGNIFICANT EVENTS:    STUDIES:   CTA of the head was done on 03/02/2018 which was negative for large vessel occlusion and no significant atherosclerosis there was mild to moderate stenosis of bilateral ICA and bilateral PCA  CT abdomen 03/02/2018 showing left ureteric obstruction secondary to kidney stone   Labs from outside hospital WBC 27.5     hemoglobin 16.2   platelets 434 Creatinine 1.7   BUN 19      glucose 299    bicarb 22     chloride 93     potassium 3.4     sodium 133   Urinalysis showed 2-5 WBC   3+ protein   3+ glucose    1+ ketones   1+ blood   CULTURES:    ANTIBIOTICS:  Ceftriaxone>>  LINES/TUBES:   CONSULTANTS:  Neurology   SUBJECTIVE:     CONSTITUTIONAL: BP (!) 170/88   Pulse 99   Temp (!) 102.3 F (39.1 C) (Axillary)   Resp 20   Wt 85.5 kg   LMP  (LMP Unknown)   SpO2 97%   BMI 33.39 kg/m   No intake/output data recorded.        PHYSICAL EXAM: General: Acutely ill lethargic protecting airway not in distress Neuro: Very lethargic hard to assess her cranial nerves but grossly intact moans to pain but does not localize not moving any of her extremities.  Plantars were downgoing  HEENT: Dry mucous membranes Cardiovascular: Normal heart sound no added sounds or murmurs Lungs: Clear equal air sounds bilateral no crackles no wheezing Abdomen: Soft no tenderness no organomegaly no guarding Musculoskeletal: No lower limb edema Skin: No rash  RESOLVED PROBLEM LIST   ASSESSMENT AND PLAN   Assessment -Acute pyelonephritis -Suspected acute CVA -Left hydronephrosis secondary to obstructive uropathy/ureteric stone status post double-J stent 03/02/2018 -Severe sepsis -Acute kidney injury -Hypokalemia   Plan -IV fluid bolus -Start ceftriaxone 1 g daily -Stat CT head -Antiplatelets as per neurology -Neurology consult discussed with Dr. Lorraine Lax -Consider  EEG -Glucose was checked fingerstick was showing glucose around 200 -Follow urine output and renal function -Repeat basic labs including lactic acid BMP and CBC -Hold lisinopril -Resume carvedilol -DVT prophylaxis -Patient is protecting her airway will observe closely for any deterioration  SUMMARY OF TODAY'S PLAN:    Best Practice / Goals of Care / Disposition.   DVT PROPHYLAXIS: Lovenox SUP: None NUTRITION: N.p.o.  mOBILITY: Bedrest GOALS OF CARE: Full code FAMILY DISCUSSIONS: Discussed with husband at bedside DISPOSITION ICU admission  LABS  Glucose No results for input(s): GLUCAP in the last 168 hours.  BMET No results for input(s): NA, K, CL, CO2, BUN, CREATININE, GLUCOSE in the last 168 hours.  Liver Enzymes No results for input(s): AST, ALT, ALKPHOS, BILITOT, ALBUMIN in the last 168 hours.  Electrolytes No results for input(s): CALCIUM, MG, PHOS in the last 168 hours.  CBC No results for input(s): WBC, HGB, HCT, PLT in the last 168 hours.  ABG No results for input(s): PHART, PCO2ART, PO2ART in the last 168 hours.  Coag's No results for input(s): APTT, INR in the last 168 hours.  Sepsis Markers No results for input(s): LATICACIDVEN, PROCALCITON, O2SATVEN in the last 168 hours.  Cardiac Enzymes No results for input(s): TROPONINI, PROBNP in the last 168 hours.  PAST MEDICAL HISTORY :   She  has a past medical history of CAD in native artery, Chronic diastolic CHF (congestive heart failure) ([place]), Diabetes mellitus ([place]), Diabetic neuropathy ([place]), History of kidney stones, Hypertension, Hypothyroidism, NSTEMI (non-ST elevated myocardial infarction) ([place]), Orthostatic hypotension, Postoperative atrial fibrillation ([place]), and TIA (transient ischemic attack).  PAST SURGICAL HISTORY:  She  has a past surgical history that includes LEFT HEART CATH AND CORONARY ANGIOGRAPHY (N/A, 02/18/2017); Abdominal hysterectomy; Laparoscopic gastric banding; Coronary artery  bypass graft (N/A, 02/24/2017); TEE without cardioversion (N/A, 02/24/2017); and Endoharvest vein of greater saphenous vein (Right, 02/24/2017).  Allergies  Allergen Reactions  . Sulfa Antibiotics Nausea And Vomiting  . Shellfish Allergy Other (See Comments)    UNKNOWN REACTION.  Allergy found on ALLERGY TEST.  . Aggrenox [Aspirin-Dipyridamole Er] Other (See Comments)    Redness in eyes, takes aspirin 325 at home at baseline    No current facility-administered medications on file prior to encounter.    Current Outpatient Medications on File Prior to Encounter  Medication Sig  . acetaminophen (TYLENOL) 325 MG tablet Take 2 tablets (650 mg total) by mouth every 6 (six) hours as needed for mild pain.  Marland Kitchen aspirin EC 81 MG tablet Take 1 tablet (81 mg total) by mouth daily.  Marland Kitchen atorvastatin (LIPITOR) 40 MG tablet Take 1 tablet (40 mg total) by mouth daily.  . carvedilol (COREG) 12.5 MG tablet Take 1 tablet (12.5 mg total) by mouth 2 (two) times daily.  . cholecalciferol (VITAMIN D) 1000 units tablet Take 3,000 Units 2 (two) times daily by mouth.  . clopidogrel (PLAVIX) 75 MG tablet Take 1  tablet (75 mg total) by mouth daily.  Marland Kitchen co-enzyme Q-10 30 MG capsule Take 30 mg daily by mouth.  Marland Kitchen glipiZIDE (GLUCOTROL) 10 MG tablet Take 10 mg 2 (two) times daily by mouth.  . Glucosamine-Chondroit-Vit C-Mn (GLUCOSAMINE CHONDR 500 COMPLEX PO) Take 1 tablet 2 (two) times daily by mouth.  Javier Docker Oil 1000 MG CAPS Take 1,000 mg daily by mouth.  . levothyroxine (SYNTHROID, LEVOTHROID) 175 MCG tablet Take 175 mcg daily before breakfast by mouth.  Marland Kitchen lisinopril (PRINIVIL,ZESTRIL) 5 MG tablet Take 1 tablet (5 mg total) by mouth daily.  Marland Kitchen MELATONIN PO Take 10 mg at bedtime by mouth.  . metFORMIN (GLUCOPHAGE) 1000 MG tablet Take 1,000 mg 2 (two) times daily by mouth.  . Multiple Vitamin (MULTIVITAMIN WITH MINERALS) TABS tablet Take 1 tablet daily by mouth.  Glory Rosebush DELICA LANCETS FINE MISC USE TO CHECK BLOOD SUGAR  DAILY    FAMILY HISTORY:   Her family history includes CVA in her father; Heart disease (age of onset: 27) in her mother. Could not be obtained due to patient altered mental status SOCIAL HISTORY:  She  reports that she quit smoking about 44 years ago. Her smoking use included cigarettes. She started smoking about 52 years ago. She has never used smokeless tobacco. She reports that she does not drink alcohol or use drugs. Could not be obtained due to patient altered mental status was as per husband patient did not smoke for 30 years and no alcohol abuse REVIEW OF SYSTEMS:    Could not be obtained due to patient altered mental status

## 2018-03-02 NOTE — Consult Note (Signed)
Requesting Physician: Dr. Silver Huguenin    Chief Complaint: AMS  History obtained from: Husband and Chart   HPI:                                                                                                                                       Theresa Ray is an 72 y.o. female with past medical history of hypertension, diabetes mellitus, chronic diastolic CHF, coronary artery disease, postoperative A. fib who presents to [facility] emergency room with altered mental status and left-sided weakness.  According to the patient's husband, last seen normal was 4 AM in the morning.  When he went to check on her around 8 AM patient was not able to stand and was leaning towards the left side.  Called EMS and a code stroke was initiated and patient was taken to [facility].  She was evaluated by tele- neurology who did not and administer TPA she was outside the window.  CT angiogram was performed which did not show any large vessel occlusion, however showed intracranial atherosclerotic disease.  CT abdomen was done which showed left ureteric obstruction and patient was taken to the OR for double-J stent.  Patient was transferred to [facility], ICU for further evaluation, upon arrival patient was very lethargic.  Neurology was consulted for further evaluation and management.  Date last known well: 12.2.19 Time last known well: 4 am tPA Given: no, outside window      Past Medical History:  Diagnosis Date  . CAD in native artery    a. s/p CABGx3 in 01/2017.  Marland Kitchen Chronic diastolic CHF (congestive heart failure) ([place])   . Diabetes mellitus ([place])   . Diabetic neuropathy ([place])   . History of kidney stones    history of stones  . Hypertension   . Hypothyroidism   . NSTEMI (non-ST elevated myocardial infarction) ([place])   . Orthostatic hypotension   . Postoperative atrial fibrillation ([place])    a. after CABG 01/2017.  Marland Kitchen TIA (transient ischemic attack)     Past Surgical History:  Procedure  Laterality Date  . ABDOMINAL HYSTERECTOMY    . CORONARY ARTERY BYPASS GRAFT N/A 02/24/2017   Procedure: CORONARY ARTERY BYPASS GRAFTING (CABG) USING LEFT INTERNAL MAMMARY ARTERY TO LAD AND ENDOSCOPICALLY HARVESTED GREATER SAPHENOUS VEIN TO PDA AND TO OM1.;  Surgeon: Grace Isaac, MD;  Location: [place];  Service: Open Heart Surgery;  Laterality: N/A;  . ENDOVEIN HARVEST OF GREATER SAPHENOUS VEIN Right 02/24/2017   Procedure: ENDOVEIN HARVEST OF GREATER SAPHENOUS VEIN;  Surgeon: Grace Isaac, MD;  Location: [place];  Service: Open Heart Surgery;  Laterality: Right;  . LAPAROSCOPIC GASTRIC BANDING    . LEFT HEART CATH AND CORONARY ANGIOGRAPHY N/A 02/18/2017   Procedure: LEFT HEART CATH AND CORONARY ANGIOGRAPHY;  Surgeon: Leonie Man, MD;  Location: [place] CV LAB;  Service: Cardiovascular;  Laterality: N/A;  .  TEE WITHOUT CARDIOVERSION N/A 02/24/2017   Procedure: TRANSESOPHAGEAL ECHOCARDIOGRAM (TEE);  Surgeon: Grace Isaac, MD;  Location: [place];  Service: Open Heart Surgery;  Laterality: N/A;    Family History  Problem Relation Age of Onset  . Heart disease Mother 77       CABG  . CVA Father    Social History:  reports that she quit smoking about 44 years ago. Her smoking use included cigarettes. She started smoking about 52 years ago. She has never used smokeless tobacco. She reports that she does not drink alcohol or use drugs.  Allergies:  Allergies  Allergen Reactions  . Sulfa Antibiotics Nausea And Vomiting  . Shellfish Allergy Other (See Comments)    UNKNOWN REACTION.  Allergy found on ALLERGY TEST.  . Aggrenox [Aspirin-Dipyridamole Er] Other (See Comments)    Redness in eyes, takes aspirin 325 at home at baseline    Medications:                                                                                                                        I reviewed home medications   ROS:                                                                                                                                      Unable to obtain due to mental status    Examination:                                                                                                      General: Appears well-developed  Psych: Lethargic, unable to assess Eyes: No scleral injection HENT: No OP obstrucion Head: Normocephalic.  Cardiovascular: Normal rate and regular rhythm.  Respiratory: Effort normal and breath sounds normal to anterior ascultation GI: Soft.  No distension. There is no tenderness.  Skin: WDI    Neurological Examination Mental Status: Patient is lethargic.  Nonverbal.  Opens eyes to sternal rub.  Not following any  commands Cranial Nerves: II: Visual fields: Appears to blink to threat bilaterally, however more consistent on the right side III,IV, VI: ptosis not present, extra-ocular motions intact bilaterally, pupils equal, round, reactive to light and accommodation VII: no obvious facial droop Motor: Moves right arm and  leg spontaneously, left upper extremity withdraws to noxious stimuli but appears weaker than the right upper extremity. Tone and bulk:normal tone throughout; no atrophy noted Sensory: Appears to grimace to pain when noxious stimuli administered on both sides Deep Tendon Reflexes: 2+ and symmetric throughout Plantars: Right: downgoing   Left: downgoing Cerebellar: Unable to assess Gait:Unable to assess   Lab Results: Basic Metabolic Panel: No results for input(s): NA, K, CL, CO2, GLUCOSE, BUN, CREATININE, CALCIUM, MG, PHOS in the last 168 hours.  CBC: No results for input(s): WBC, NEUTROABS, HGB, HCT, MCV, PLT in the last 168 hours.  Coagulation Studies: No results for input(s): LABPROT, INR in the last 72 hours.  Imaging: MRI Aaron Edelman : multiple infarcts  In R cerebral hemisphere   ASSESSMENT AND PLAN   72 y.o. female with past medical history of hypertension, diabetes mellitus, chronic diastolic CHF, coronary  artery disease, postoperative A. fib who presents to [facility] emergency room with altered mental status and left-sided weakness. MRI brain shows multiple acute infarctions in the right cerebral hemisphere.  Patient appears to be more lethargic than what would need to be explained on MRI.    Multiple acute embolic infarctions  Risk factors: hypertension, diabetes mellitus, chronic diastolic CHF, coronary artery disease, postoperative A. fib Etiology: cardioembolic stroke vs septic emboli   Recommend # MRI Brain (completed) #CTA head completed  #Transthoracic Echo  # Hold ASA until Echo completed # Blood cultures #Start or continue Atorvastatin 40 mg/other high intensity statin # BP goal: permissive HTN upto 85/110 mmHg # HBAIC and Lipid profile # Telemetry monitoring # Frequent neuro checks # NPO until passes stroke swallow screen  Acute Encephalopathy 2/2 Sepsis, acute stroke - also possible seizure Stat EEG to evaluate for possible seizures. R/o nonconvulsive status epilepticus     [facility] Pager Number [phone]

## 2018-03-03 ENCOUNTER — Inpatient Hospital Stay (HOSPITAL_COMMUNITY): Payer: Medicare HMO

## 2018-03-03 ENCOUNTER — Other Ambulatory Visit: Payer: Self-pay

## 2018-03-03 DIAGNOSIS — I6340 Cerebral infarction due to embolism of unspecified cerebral artery: Secondary | ICD-10-CM

## 2018-03-03 DIAGNOSIS — I340 Nonrheumatic mitral (valve) insufficiency: Secondary | ICD-10-CM

## 2018-03-03 DIAGNOSIS — N179 Acute kidney failure, unspecified: Secondary | ICD-10-CM

## 2018-03-03 LAB — GLUCOSE, CAPILLARY
GLUCOSE-CAPILLARY: 166 mg/dL — AB (ref 70–99)
GLUCOSE-CAPILLARY: 132 mg/dL — AB (ref 70–99)
Glucose-Capillary: 188 mg/dL — ABNORMAL HIGH (ref 70–99)
Glucose-Capillary: 186 mg/dL — ABNORMAL HIGH (ref 70–99)
Glucose-Capillary: 155 mg/dL — ABNORMAL HIGH (ref 70–99)
Glucose-Capillary: 108 mg/dL — ABNORMAL HIGH (ref 70–99)

## 2018-03-03 LAB — CBC
HCT: 39.2 % (ref 36.0–46.0)
Hemoglobin: 13.2 g/dL (ref 12.0–15.0)
MCH: 30.4 pg (ref 26.0–34.0)
MCHC: 33.7 g/dL (ref 30.0–36.0)
MCV: 90.3 fL (ref 80.0–100.0)
Platelets: 327 10*3/uL (ref 150–400)
RBC: 4.34 MIL/uL (ref 3.87–5.11)
RDW: 12.3 % (ref 11.5–15.5)
WBC: 19.3 10*3/uL — ABNORMAL HIGH (ref 4.0–10.5)
nRBC: 0 % (ref 0.0–0.2)

## 2018-03-03 LAB — LIPID PANEL
Cholesterol: 167 mg/dL (ref 0–200)
HDL: 37 mg/dL — ABNORMAL LOW (ref >40)
LDL Cholesterol: 99 mg/dL (ref 0–99)
Total CHOL/HDL Ratio: 4.5 RATIO
Triglycerides: 157 mg/dL — ABNORMAL HIGH (ref <150)
VLDL: 31 mg/dL (ref 0–40)

## 2018-03-03 LAB — ECHOCARDIOGRAM COMPLETE
Height: 65 in
WEIGHTICAEL: 3019.42 [oz_av]

## 2018-03-03 LAB — LACTIC ACID, PLASMA
LACTIC ACID, VENOUS: 1.8 mmol/L (ref 0.5–1.9)
Lactic Acid, Venous: 4.2 mmol/L (ref 0.5–1.9)
Lactic Acid, Venous: 2 mmol/L (ref 0.5–1.9)

## 2018-03-03 LAB — BASIC METABOLIC PANEL
Anion gap: 17 — ABNORMAL HIGH (ref 5–15)
Anion gap: 12 (ref 5–15)
BUN: 23 mg/dL (ref 8–23)
BUN: 24 mg/dL — ABNORMAL HIGH (ref 8–23)
CO2: 21 mmol/L — ABNORMAL LOW (ref 22–32)
CO2: 22 mmol/L (ref 22–32)
Calcium: 8.4 mg/dL — ABNORMAL LOW (ref 8.9–10.3)
Calcium: 8.1 mg/dL — ABNORMAL LOW (ref 8.9–10.3)
Chloride: 96 mmol/L — ABNORMAL LOW (ref 98–111)
Chloride: 101 mmol/L (ref 98–111)
Creatinine, Ser: 1.79 mg/dL — ABNORMAL HIGH (ref 0.44–1.00)
Creatinine, Ser: 1.56 mg/dL — ABNORMAL HIGH (ref 0.44–1.00)
GFR calc Af Amer: 33 mL/min — ABNORMAL LOW (ref >60)
GFR calc Af Amer: 39 mL/min — ABNORMAL LOW (ref >60)
GFR calc non Af Amer: 28 mL/min — ABNORMAL LOW (ref >60)
GFR calc non Af Amer: 34 mL/min — ABNORMAL LOW (ref >60)
GLUCOSE: 210 mg/dL — AB (ref 70–99)
Glucose, Bld: 190 mg/dL — ABNORMAL HIGH (ref 70–99)
Potassium: 3.1 mmol/L — ABNORMAL LOW (ref 3.5–5.1)
Potassium: 3.1 mmol/L — ABNORMAL LOW (ref 3.5–5.1)
Sodium: 134 mmol/L — ABNORMAL LOW (ref 135–145)
Sodium: 135 mmol/L (ref 135–145)

## 2018-03-03 LAB — HEMOGLOBIN A1C
Hgb A1c MFr Bld: 6 % — ABNORMAL HIGH (ref 4.8–5.6)
Mean Plasma Glucose: 125.5 mg/dL

## 2018-03-03 LAB — MRSA PCR SCREENING: MRSA BY PCR: NEGATIVE

## 2018-03-03 LAB — HIV ANTIBODY (ROUTINE TESTING W REFLEX): HIV Screen 4th Generation wRfx: NONREACTIVE

## 2018-03-03 MED ORDER — SODIUM CHLORIDE 0.9 % IV SOLN
INTRAVENOUS | Status: DC | PRN
  Administered 2018-03-03: 250 mL via INTRAVENOUS

## 2018-03-03 MED ORDER — LACTATED RINGERS IV BOLUS
1000.0000 mL | Freq: Once | INTRAVENOUS | Status: AC
  Administered 2018-03-03: 1000 mL via INTRAVENOUS

## 2018-03-03 MED ORDER — POTASSIUM CHLORIDE 10 MEQ/100ML IV SOLN
10.0000 meq | INTRAVENOUS | Status: AC
  Administered 2018-03-03 (×4): 10 meq via INTRAVENOUS
  Filled 2018-03-03 (×4): qty 100

## 2018-03-03 MED ORDER — SODIUM CHLORIDE 0.9 % IV BOLUS
1000.0000 mL | Freq: Once | INTRAVENOUS | Status: AC
  Administered 2018-03-03: 1000 mL via INTRAVENOUS

## 2018-03-03 MED ORDER — INSULIN ASPART 100 UNIT/ML ~~LOC~~ SOLN
0.0000 [IU] | SUBCUTANEOUS | Status: DC
  Administered 2018-03-03 (×2): 3 [IU] via SUBCUTANEOUS
  Administered 2018-03-04 (×2): 2 [IU] via SUBCUTANEOUS
  Administered 2018-03-04 (×3): 3 [IU] via SUBCUTANEOUS
  Administered 2018-03-05: 2 [IU] via SUBCUTANEOUS
  Administered 2018-03-05 (×3): 3 [IU] via SUBCUTANEOUS
  Administered 2018-03-05 (×2): 2 [IU] via SUBCUTANEOUS
  Administered 2018-03-06: 3 [IU] via SUBCUTANEOUS
  Administered 2018-03-06: 2 [IU] via SUBCUTANEOUS
  Administered 2018-03-06: 3 [IU] via SUBCUTANEOUS
  Administered 2018-03-06: 5 [IU] via SUBCUTANEOUS
  Administered 2018-03-06: 2 [IU] via SUBCUTANEOUS
  Administered 2018-03-06 – 2018-03-07 (×2): 3 [IU] via SUBCUTANEOUS
  Administered 2018-03-07: 5 [IU] via SUBCUTANEOUS
  Administered 2018-03-07 (×2): 2 [IU] via SUBCUTANEOUS
  Administered 2018-03-08: 3 [IU] via SUBCUTANEOUS
  Administered 2018-03-08: 8 [IU] via SUBCUTANEOUS

## 2018-03-03 NOTE — Progress Notes (Signed)
eLink Physician-Brief Progress Note Patient Name: Theresa Ray DOB: 11/29/1945 MRN: [id number]   Date of Service  03/03/2018  HPI/Events of Note  Lactic Acid = 4.1 --> 4.2. LVEF = 65-70%  eICU Interventions  Will bolus with 0.9 NaCl 1 liter IV over 1 hour now.      Intervention Category Major Interventions: Acid-Base disturbance - evaluation and management  [name] [name] 03/03/2018, 3:51 AM

## 2018-03-03 NOTE — Progress Notes (Signed)
ELECTROENCEPHALOGRAM REPORT   MRN: [id number]  Clinical History: Theresa Ray is an 72 y.o. female with past medical history of hypertension, diabetes mellitus, chronic diastolic CHF, coronary artery disease, postoperative A. fib who presents to [facility] emergency room with altered mental status and left-sided weakness.  According to the patient's husband, last seen normal was 4 AM in the morning 12/2.  When he went to check on her around 8 AM patient was not able to stand and was leaning towards the left side.  Called EMS and a code stroke was initiated and patient was taken to [facility].  She was evaluated by tele- neurology who did not and administer TPA she was outside the window.  CT angiogram was performed which did not show any large vessel occlusion, however showed intracranial atherosclerotic disease.  CT abdomen was done which showed left ureteric obstruction and patient was taken to the OR for double-J stent.  Patient was transferred to [facility], ICU for further evaluation, upon arrival patient was very lethargic.  Neurology was consulted for further evaluation and management. No AEDs no sedation MRI showed B/L stroke   Medications: None   Technical Summary:  A multichannel digital EEG recording measured by the international 10-20 system with electrodes applied with paste and impedances below 5000 ohms performed in our laboratory with EKG monitoring in an awake and asleep patient. Hyperventilation and photic stimulation were not performed. The digital EEG was referentially recorded, reformatted, and digitally filtered in a variety of bipolar and referential montages for optimal display.  Description: The patient is somnolent during the recording. During maximal wakefulness, there is  medium voltage 10 Hz posterior dominant rhythm that attenuates with eye opening. The record is asymmetric with generalized slowing but more focal slowing on the Right.  There were no electrographic  seizures seen.  EKG lead was unremarkable.  Impression: This awake and asleep EEG is abnormal due to generalized slowing with focal R slowing, focal slowing is   commonly associated with an acute structural lesion There were no electrographic seizures in this study.

## 2018-03-03 NOTE — Progress Notes (Signed)
Rehab Admissions Coordinator Note:  Patient was screened by Cleatrice Burke for appropriateness for an Inpatient Acute Rehab Consult per PT, OT, and SLP recs. At this time, we are recommending Inpatient Rehab consult. Please place order for consult.  Cleatrice Burke 03/03/2018, 2:04 PM  I can be reached at [phone].

## 2018-03-03 NOTE — Progress Notes (Signed)
PULMONARY / CRITICAL CARE MEDICINE   NAME:  LAURENA VALKO, MRN:  [id number], DOB:  1945/06/13, LOS: 1 ADMISSION DATE:  03/02/2018, CONSULTATION DATE: 03/02/2018 REFERRING MD: [facility], CHIEF COMPLAINT: Altered mental status  BRIEF HISTORY:    72 year old female with CAD s/p CABG and TIAs who presented to [facility] for altered mental status, left-sided weakness and severe abdominal pain and found with left ureteral obstruction requiring stent placement.  She was transferred to [facility] for altered mental status.  Initial CT head negative for acute intracranial hemorrhage however MRI demonstrated small multifocal infarcts concerning for embolic stroke.  SIGNIFICANT PAST MEDICAL HISTORY   -Coronary artery disease status post CABG x3 -Hypertension -Hyperlipidemia -TIAs -Diabetes mellitus   SIGNIFICANT EVENTS:    STUDIES:   CTA of the head 03/02/2018: negative for large vessel occlusion and no significant atherosclerosis there was mild to moderate stenosis of bilateral ICA and bilateral PCA  CT abdomen 03/02/2018 showing left ureteric obstruction secondary to kidney stone  CT Head 03/02/18: No intracranial hemorrhage  MR Brain 03/03/18: 1. Multifocal acute bilateral small infarcts spanning multiple vascular territories most compatible with embolic phenomena. 2. Moderate to severe chronic small vessel ischemic changes. 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   CULTURES:  BCx 12/3>>NGTD  ANTIBIOTICS:  Ceftriaxone>>  LINES/TUBES:  PIV x 2 12/2>> CONSULTANTS:  Neurology   SUBJECTIVE:  Admitted overnight. Given 1L IVF bolus for persistent lactic acidosis. Awake with delayed speech. Reports improved abdominal pain. Denies shortness of breath or chest pain.  CONSTITUTIONAL: BP (!) 172/92   Pulse 94   Temp (!) 100.6 F (38.1 C) (Oral)   Resp (!) 22   Ht 5\' 5"  (1.651 m)   Wt 85.6 kg   LMP  (LMP Unknown)   SpO2 98%   BMI 31.40 kg/m   I/O last 3  completed shifts: In: 2182.1 [I.V.:625.1; IV Piggyback:1557.1] Out: 375 [Urine:375]        PHYSICAL EXAM: General:Awake, delayed speech, intermittently follows commands, NAD Neuro: CNII-XII grossly intact, delayed speech response  HEENT: EOMI, no scleral icterus Cardiovascular: RRR, no m/r/g Lungs: CTABL, no wheezing or rhonchi Abdomen: Soft, NTTP, BS+ Musculoskeletal: No edema or cyanosis in extremities Skin: No rash or bruising  RESOLVED PROBLEM LIST   ASSESSMENT AND PLAN   72 year old female transferred for altered mental status and left sided weakness concerning for stroke  Multiple CVA infarcts --Neurology following --Will start statin when PO access available --Follow-up TTE/TEE --EEG report pending --Hold antiplatelet/anticoagulation for possible endocarditis --Permissive hypertension <220/120 48 hours post stroke per Neuro  Acute pyelonephritis with hydronephrosis secondary to obstruction s/p stent Severe sepsis --Continue Ceftriaxone --Trend lactic acid --Consult Urology  AKI Likely secondary to ureteral obstruction. Expect improvement post stent placement --Serial BMP  CAD/NSTEMI s/p CABG in 2018 --Hold home DAPT  Hypokalemia --Replete K, goal >4 --Serial BMP  Hyperglycemia --POCT CBG q4h while NPO --Moderate SSI --HA1c pending   SUMMARY OF TODAY'S PLAN:    Best Practice / Goals of Care / Disposition.   DVT PROPHYLAXIS: Lovenox SUP: None NUTRITION: N.p.o.  mOBILITY: Bedrest GOALS OF CARE: Full code FAMILY DISCUSSIONS: Discussed with husband at bedside DISPOSITION ICU admission  LABS  Glucose Recent Labs  Lab 03/02/18 2222 03/03/18 0311 03/03/18 0739  GLUCAP 248* 188* 166*    BMET Recent Labs  Lab 03/02/18 2302 03/03/18 0154  NA 136 134*  K 2.9* 3.1*  CL 96* 96*  CO2 22 21*  BUN 22 23  CREATININE  1.87* 1.79*  GLUCOSE 224* 210*    Liver Enzymes Recent Labs  Lab 03/02/18 2302  AST 32  ALT 19  ALKPHOS 41  BILITOT  0.9  ALBUMIN 3.3*    Electrolytes Recent Labs  Lab 03/02/18 2302 03/03/18 0154  CALCIUM 8.6* 8.4*    CBC Recent Labs  Lab 03/02/18 2302 03/03/18 0154  WBC 22.6* 19.3*  HGB 13.9 13.2  HCT 41.5 39.2  PLT 352 327    ABG No results for input(s): PHART, PCO2ART, PO2ART in the last 168 hours.  Coag's No results for input(s): APTT, INR in the last 168 hours.  Sepsis Markers Recent Labs  Lab 03/02/18 2242 03/03/18 0154  LATICACIDVEN 4.1* 4.2*

## 2018-03-03 NOTE — Progress Notes (Signed)
  Echocardiogram 2D Echocardiogram has been performed.  Theresa Ray 03/03/2018, 3:55 PM

## 2018-03-03 NOTE — Progress Notes (Signed)
Chaplain responded to spiritual consult for Advanced Directive. Patient was resting.  Chaplain explained AD docs to her husband who was bedside.  Will follow up tomorrow when pt and husband have talked. Tamsen Snider Pager [phone]

## 2018-03-03 NOTE — Evaluation (Signed)
Clinical/Bedside Swallow Evaluation Patient Details  Name: Theresa Ray MRN: [id number] Date of Birth: Aug 15, 1945  Today's Date: 03/03/2018 Time: SLP Start Time (ACUTE ONLY): 79 SLP Stop Time (ACUTE ONLY): 1020 SLP Time Calculation (min) (ACUTE ONLY): 15 min  Past Medical History:  Past Medical History:  Diagnosis Date  . CAD in native artery    a. s/p CABGx3 in 01/2017.  Marland Kitchen Chronic diastolic CHF (congestive heart failure) ([place])   . Diabetes mellitus ([place])   . Diabetic neuropathy ([place])   . History of kidney stones    history of stones  . Hypertension   . Hypothyroidism   . NSTEMI (non-ST elevated myocardial infarction) ([place])   . Orthostatic hypotension   . Postoperative atrial fibrillation ([place])    a. after CABG 01/2017.  Marland Kitchen TIA (transient ischemic attack)    Past Surgical History:  Past Surgical History:  Procedure Laterality Date  . ABDOMINAL HYSTERECTOMY    . CORONARY ARTERY BYPASS GRAFT N/A 02/24/2017   Procedure: CORONARY ARTERY BYPASS GRAFTING (CABG) USING LEFT INTERNAL MAMMARY ARTERY TO LAD AND ENDOSCOPICALLY HARVESTED GREATER SAPHENOUS VEIN TO PDA AND TO OM1.;  Surgeon: Grace Isaac, MD;  Location: [place];  Service: Open Heart Surgery;  Laterality: N/A;  . ENDOVEIN HARVEST OF GREATER SAPHENOUS VEIN Right 02/24/2017   Procedure: ENDOVEIN HARVEST OF GREATER SAPHENOUS VEIN;  Surgeon: Grace Isaac, MD;  Location: [place];  Service: Open Heart Surgery;  Laterality: Right;  . LAPAROSCOPIC GASTRIC BANDING    . LEFT HEART CATH AND CORONARY ANGIOGRAPHY N/A 02/18/2017   Procedure: LEFT HEART CATH AND CORONARY ANGIOGRAPHY;  Surgeon: Leonie Man, MD;  Location: [place] CV LAB;  Service: Cardiovascular;  Laterality: N/A;  . TEE WITHOUT CARDIOVERSION N/A 02/24/2017   Procedure: TRANSESOPHAGEAL ECHOCARDIOGRAM (TEE);  Surgeon: Grace Isaac, MD;  Location: [place];  Service: Open Heart Surgery;  Laterality: N/A;   HPI:  Theresa Ray is a 72 year old lady with  history of CAD and TIAs transferred [facility] due to altered mental status and left-sided weakness was found to have ureteral obstruction status post stent.  MRI completed with findings of Multifocal acute bilateral small infarcts spanning multiple vascular territories most compatible with embolic phenomena and Moderate to severe chronic small vessel ischemic changes. 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   Assessment / Plan / Recommendation Clinical Impression   Pt presents with a moderate risk of aspiration given multiple factors impacting swallowing safety.  Most notable is pt's significant cognitive impairment which impacts pt's ability to attend to and manipulate boluses and leads to oral holding.  As a result, pt needed max cues to clear both liquid and pureed boluses from the oral cavity.  Pt has mild left sided oral motor weakness which did not appear to impact containment of boluses as pt had no anterior labial spillage across any consistencies assessed.  Pt also appears to have adequate management of her secretions as evidenced by clear, even respirations and clear voicing.  When POs were swallowed, pt had no overt s/s of aspiration but swallow response was significantly delayed due to deficits mentioned above.  I am hopeful that as pt's mentation clears she will be able to resume a PO diet; however, I suspect her recovery course to be prolonged and therefore she would most likely benefit from short term alternative means of nutrition.  Discussed findings with pt's husband who verbalized understanding and was very supportive of any means necessary to help pt recover.  SLP Visit Diagnosis: Dysphagia, unspecified (R13.10)    Aspiration Risk  Moderate aspiration risk    Diet Recommendation NPO;Alternative means - temporary   Medication Administration: Via alternative means    Other  Recommendations Oral Care Recommendations: Oral care QID   Follow up Recommendations 24  hour supervision/assistance;Inpatient Rehab      Frequency and Duration min 2x/week          Prognosis Prognosis for Safe Diet Advancement: Good Barriers to Reach Goals: Cognitive deficits      Swallow Study   General HPI: Theresa Ray is a 72 year old lady with history of CAD and TIAs transferred from [facility] due to altered mental status and left-sided weakness was found to have ureteral obstruction status post stent.  MRI completed with findings of Multifocal acute bilateral small infarcts spanning multiple vascular territories most compatible with embolic phenomena and Moderate to severe chronic small vessel ischemic changes. 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 Type of Study: Bedside Swallow Evaluation Previous Swallow Assessment: none on record Diet Prior to this Study: NPO Temperature Spikes Noted: Yes Respiratory Status: Nasal cannula History of Recent Intubation: No Behavior/Cognition: Alert;Distractible;Requires cueing;Doesn't follow directions Oral Cavity Assessment: Within Functional Limits Oral Care Completed by SLP: Yes Oral Cavity - Dentition: Adequate natural dentition Self-Feeding Abilities: Total assist Patient Positioning: Upright in bed Baseline Vocal Quality: Normal Volitional Cough: Cognitively unable to elicit Volitional Swallow: Unable to elicit    Oral/Motor/Sensory Function Overall Oral Motor/Sensory Function: Mild impairment Facial ROM: Reduced left Facial Symmetry: Within Functional Limits Facial Sensation: (unable to assess due to cognition )   Ice Chips     Thin Liquid Thin Liquid: Impaired Presentation: Spoon Oral Phase Impairments: Poor awareness of bolus;Reduced lingual movement/coordination Oral Phase Functional Implications: Oral holding;Prolonged oral transit Pharyngeal  Phase Impairments: Suspected delayed Swallow    Nectar Thick     Honey Thick     Puree Puree: Impaired Presentation: Spoon Oral Phase Impairments:  Poor awareness of bolus;Reduced lingual movement/coordination Oral Phase Functional Implications: Oral holding;Prolonged oral transit Pharyngeal Phase Impairments: Suspected delayed Swallow   Solid            Conley Pawling, Elmyra Ricks L 03/03/2018,10:47 AM

## 2018-03-03 NOTE — Progress Notes (Signed)
STROKE TEAM PROGRESS NOTE   SUBJECTIVE (INTERVAL HISTORY) Her husband and RN are at the bedside.  Overall her condition is rapidly improving.  Patient awake alert, sitting in bed, able to follow some simple commands, orientated to place and people.  Left-sided weakness largely resolved, however, still has fever, and will do blood culture to rule out bacteremia.   OBJECTIVE Temp:  [98.9 F (37.2 C)-102.3 F (39.1 C)] 100.6 F (38.1 C) (12/03 0800) Pulse Rate:  [87-100] 94 (12/03 0900) Cardiac Rhythm: Normal sinus rhythm (12/03 0800) Resp:  [20-26] 22 (12/03 0900) BP: (140-186)/(74-94) 172/92 (12/03 0900) SpO2:  [96 %-99 %] 98 % (12/03 0900) Weight:  [85.5 kg-85.6 kg] 85.6 kg (12/03 0500)  Recent Labs  Lab 03/02/18 2222 03/03/18 0311 03/03/18 0739  GLUCAP 248* 188* 166*   Recent Labs  Lab 03/02/18 2302 03/03/18 0154  NA 136 134*  K 2.9* 3.1*  CL 96* 96*  CO2 22 21*  GLUCOSE 224* 210*  BUN 22 23  CREATININE 1.87* 1.79*  CALCIUM 8.6* 8.4*   Recent Labs  Lab 03/02/18 2302  AST 32  ALT 19  ALKPHOS 41  BILITOT 0.9  PROT 6.9  ALBUMIN 3.3*   Recent Labs  Lab 03/02/18 2302 03/03/18 0154  WBC 22.6* 19.3*  NEUTROABS 20.1*  --   HGB 13.9 13.2  HCT 41.5 39.2  MCV 90.8 90.3  PLT 352 327   No results for input(s): CKTOTAL, CKMB, CKMBINDEX, TROPONINI in the last 168 hours. No results for input(s): LABPROT, INR in the last 72 hours. No results for input(s): COLORURINE, LABSPEC, [place], GLUCOSEU, HGBUR, BILIRUBINUR, KETONESUR, PROTEINUR, UROBILINOGEN, NITRITE, LEUKOCYTESUR in the last 72 hours.  Invalid input(s): APPERANCEUR     Component Value Date/Time   CHOL 125 02/17/2017 0042   TRIG 358 (H) 02/17/2017 0042   HDL 30 (L) 02/17/2017 0042   CHOLHDL 4.2 02/17/2017 0042   VLDL 72 (H) 02/17/2017 0042   LDLCALC 23 02/17/2017 0042   Lab Results  Component Value Date   HGBA1C 9.0 (H) 02/19/2017   No results found for: LABOPIA, COCAINSCRNUR, LABBENZ, AMPHETMU,  THCU, LABBARB  No results for input(s): ETH in the last 168 hours.  I have personally reviewed the radiological images below and agree with the radiology interpretations.  Ct Head Wo Contrast  Result Date: 03/02/2018 CLINICAL DATA:  72 year old female with altered mental status. EXAM: CT HEAD WITHOUT CONTRAST TECHNIQUE: Contiguous axial images were obtained from the base of the skull through the vertex without intravenous contrast. COMPARISON:  Head CT dated 03/02/2018 FINDINGS: Brain: There is mild age-related atrophy. Moderate to advanced periventricular and deep white matter hypodensities most consistent with chronic microvascular ischemic changes and similar to prior CT. Left basal ganglia old lacunar infarct. There is no acute intracranial hemorrhage. No mass effect or midline shift. No extra-axial fluid collection. Vascular: No hyperdense vessel or unexpected calcification. Skull: Normal. Negative for fracture or focal lesion. Sinuses/Orbits: Mild mucoperiosteal thickening of paranasal sinuses. No air-fluid levels. The mastoid air cells are clear. Other: None IMPRESSION: 1. No acute intracranial hemorrhage. 2. Age-related atrophy and chronic microvascular ischemic changes. Electronically Signed   By: Anner Crete M.D.   On: 03/02/2018 23:09   Mr Brain Wo Contrast  Result Date: 03/03/2018 CLINICAL DATA:  Altered mental status.  LEFT-sided weakness. EXAM: MRI HEAD WITHOUT CONTRAST TECHNIQUE: Multiplanar, multiecho pulse sequences of the brain and surrounding structures were obtained without intravenous contrast. COMPARISON:  CT HEAD March 02, 2018 and MRI head May 14, 2012. FINDINGS: INTRACRANIAL CONTENTS: Subcentimeter foci reduced diffusion LEFT frontal, RIGHT parietal and bilateral temporal lobes with low ADC values. Small areas of confluent reduced diffusion RIGHT parietal and occipital lobes with corresponding low ADC values. Susceptibility artifact associated with bilateral old basal  ganglia infarcts. Old small RIGHT inferior cerebellar infarct. No susceptibility artifact to suggest interval hemorrhage. Confluent supratentorial white matter FLAIR T2 hyperintensities. Mild ex vacuo dilatation bilateral lateral ventricles. No hydrocephalus. No abnormal extra-axial fluid collections. VASCULAR: Normal major intracranial vascular flow voids present at skull base. SKULL AND UPPER CERVICAL SPINE: No abnormal sellar expansion. No suspicious calvarial bone marrow signal. Craniocervical junction maintained. SINUSES/ORBITS: The mastoid air-cells and included paranasal sinuses are well-aerated.The included ocular globes and orbital contents are non-suspicious. OTHER: None. IMPRESSION: 1. Multifocal acute bilateral small infarcts spanning multiple vascular territories most compatible with embolic phenomena. 2. Moderate to severe chronic small vessel ischemic changes. 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 Critical Value/emergent results text paged to [place] via [employer] secure system on 03/03/2018 at 5:57 am, including interpreting physician's phone number. Electronically Signed   By: Elon Alas M.D.   On: 03/03/2018 05:58   EEG pending  TTE pending   PHYSICAL EXAM  Temp:  [98.9 F (37.2 C)-102.3 F (39.1 C)] 100.6 F (38.1 C) (12/03 0800) Pulse Rate:  [87-100] 94 (12/03 0900) Resp:  [20-26] 22 (12/03 0900) BP: (140-186)/(74-94) 172/92 (12/03 0900) SpO2:  [96 %-99 %] 98 % (12/03 0900) Weight:  [85.5 kg-85.6 kg] 85.6 kg (12/03 0500)  General - Well nourished, well developed, in no apparent distress.  Ophthalmologic - fundi not visualized due to noncooperation.  Cardiovascular - Regular rate and rhythm, occasional PACs on telemetry.  Neuro -awake, alert, sitting in bed, psychomotor slowing, decreased nutrition attention.  Orientated to place and people, as well as self and age, but not orientated to time.  Significant psychomotor slowing, able to follow limited  simple commands, naming 1 out of 2 and able to repeat.  Paucity of speech, however no dysarthria.  PERRL, able to attend to both sides but slow to react, facial symmetrical, tongue protrusion not cooperative.  Blinking to visual threat on the right, inconsistently blinking to visual threat on the left.  Bilateral upper extremity 4/5, right lower extremity 3/5 proximal and distal, left lower extremities 3-/5 proximal and distal.  DTR 1+, Babinski positive on the left.  Sensation and coordination not cooperative.  Gait not tested.   ASSESSMENT/PLAN Theresa Ray is a 72 y.o. female with history of hypertension, diabetes, hyperlipidemia, CHF, CAD/MI, postop A. fib, admitted for altered mental status and left-sided weakness. No tPA given due to outside window.    Stroke: Acute right MCA, MCA/PCA, MCA/ACA and left punctate MCA infarcts, cardioembolic pattern, source unclear, concerning for endocarditis versus undiagnosed A. fib  Resultant psychomotor slowing, high-grade fever  CT head no acute abnormality  MRI acute right MCA and punctate left MCA infarcts, chronic bilateral BG infarcts.  CT head and neck no LVO, moderate stenosis right V4, left A2, bilateral M2/M3 and left P2  2D Echo pending  EEG pending  Consider TEE once stable to rule out endocarditis  LDL pending  HgbA1c pending  Lovenox for VTE prophylaxis  N.p.o. for now  aspirin 81 mg daily and clopidogrel 75 mg daily prior to admission, now on No antithrombotic pending to rule out endocarditis  Ongoing aggressive stroke risk factor management  Therapy recommendations:  Pending   Disposition:  Pending   Sepsis, source unclear  T-max 102.3  Blood culture pending  Leukocytosis WBC 22.6-19.3  Lactic acid 4.2  On Rocephin empiric treatment  CCM on board  Clinically improving  Ureteral obstruction status post stent  CT abdomen showed ureteral obstruction  Urology on board  Status post double-J  stent  CAD with history of non-STEMI status post CABG  01/2017 no STEMI status post CABG  On aspirin Plavix PTA  Postop A. fib on amiodarone, converted to NSR  Follow with Dr. Angelena Form as outpatient  Episodes of aphasia and syncope  2 months ago one episode of aphasia but recovered quickly  1 months ago episode of syncope with hot shower  Several months ago episode of gait difficulty with staring spells and amnesia  Had 30-day cardiac event monitoring in 04/20/2017 and 01/18/2018 showed no A. Fib  EEG pending  Patient denies any heart palpitation or racing heart  If TEE showed no endocarditis, will discuss with Dr. Angelena Form to consider loop recorder versus anticoagulation  Diabetes  HgbA1c pending goal < 7.0  Glucose fluctuating  CBG monitoring  SSI  DM education and close PCP follow up  Hypertension . Stable on the high end . Permissive hypertension (OK if <220/120) for 24-48 hours post stroke and then gradually normalized within 5-7 days.  Long term BP goal normotensive  Hyperlipidemia  Home meds: Lipitor 40  LDL pending, goal < 70  Continue statin once p.o. access  Dysphagia  Did not pass swallow today  Continue IV fluid  Speech to follow  Reassess in a.m.  Other Stroke Risk Factors  Advanced age  Obesity, Body mass index is 31.4 kg/m.   Hx stroke/TIA by MRI chronic bilateral BG infarcts  Other Active Problems  Hypokalemia potassium 3.1, supplement  Elevated creatinine 1.87-1.79, continue hydration  Hyperglycemia  Hospital day # 1  This patient is critically ill due to sepsis, embolic stroke, ureteral obstruction, postop A. fib, and at significant risk of neurological worsening, death form recurrent stroke, septic shock, hemorrhagic conversion, heart failure, seizure. This patient's care requires constant monitoring of vital signs, hemodynamics, respiratory and cardiac monitoring, review of multiple databases, neurological  assessment, discussion with family, other specialists and medical decision making of high complexity. I spent 40 minutes of neurocritical care time in the care of this patient. I had long discussion with patient and husband at bedside, updated pt current condition, treatment plan and potential prognosis. They expressed understanding and appreciation.    Rosalin Hawking, MD PhD Stroke Neurology 03/03/2018 11:08 AM    To contact Stroke Continuity provider, please refer to [email or web address] After hours, contact General Neurology

## 2018-03-03 NOTE — Progress Notes (Signed)
EEG completed, results pending. 

## 2018-03-03 NOTE — Evaluation (Signed)
Physical Therapy Evaluation Patient Details Name: Theresa Ray MRN: [id number] DOB: 1945-06-11 Today's Date: 03/03/2018   History of Present Illness  [name] is a 72 year old lady with history of CAD and TIAs transferred [facility] due to altered mental status and left-sided weakness was found to have ureteral obstruction status post stent. MRI revealed multifocal acute bilat small infaccts t/p brain compatible with embolic phenomena. 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  Clinical Impression  Pt admitted with above. Pt slow to respond and requires max verbal and tactile cues to initiate and complete task. Pt with noted L sided neglect and impaired sequencing. Pt was indep PTA but now requires maxAx2 for transfers and mobility. Pt to benefit from CIR upon d/c to maximize functional return for safe transition home with spouse.     Follow Up Recommendations CIR    Equipment Recommendations  (TBD at next venue)    Recommendations for Other Services Rehab consult     Precautions / Restrictions Precautions Precautions: Fall Precaution Comments: L sided neglect Restrictions Weight Bearing Restrictions: No      Mobility  Bed Mobility Overal bed mobility: Needs Assistance Bed Mobility: Rolling;Sidelying to Sit;Sit to Sidelying Rolling: Total assist;+2 for physical assistance Sidelying to sit: Total assist;+2 for physical assistance     Sit to sidelying: Total assist;+2 for physical assistance General bed mobility comments: patient with limited initation, motor planning and sequencing, requires total +2, minimal initation of R side with multimodal cueing   Transfers Overall transfer level: Needs assistance Equipment used: 2 person hand held assist Transfers: Sit to/from Stand Sit to Stand: Max assist;+2 physical assistance         General transfer comment: once initial power up initiated pt assisted with transfer, maxA at posterior hips to promote trunk extension  and upright position  Ambulation/Gait             General Gait Details: pt maxAx2 for side steps to [facility], maxA to advance L LE (no knee buckling, but impaired sequencing/processing), maxA to weight shift to the L to then advance the R. completed 4 side steps  Stairs            Wheelchair Mobility    Modified Rankin (Stroke Patients Only) Modified Rankin (Stroke Patients Only) Pre-Morbid Rankin Score: No symptoms Modified Rankin: Moderately severe disability     Balance Overall balance assessment: Needs assistance Sitting-balance support: No upper extremity supported;Feet supported Sitting balance-Leahy Scale: Fair Sitting balance - Comments: static sitting with min assist for safety   Standing balance support: Bilateral upper extremity supported;During functional activity Standing balance-Leahy Scale: Poor Standing balance comment: reliant on B UE and external support                             Pertinent Vitals/Pain Pain Assessment: No/denies pain    Home Living Family/patient expects to be discharged to:: Private residence Living Arrangements: Spouse/significant other Available Help at Discharge: Family;Available 24 hours/day Type of Home: House Home Access: Stairs to enter Entrance Stairs-Rails: Can reach both Entrance Stairs-Number of Steps: 6-9 Home Layout: One level Home Equipment: Walker - 2 wheels;Bedside commode;Hand held shower head      Prior Function Level of Independence: Independent         Comments: independent with mobility and ADLs, no IADLs (some driving)      Hand Dominance   Dominant Hand: Right    Extremity/Trunk Assessment   Upper Extremity Assessment  Upper Extremity Assessment: Defer to OT evaluation RUE Deficits / Details: grossly 4/5 MMT, able to use functionally given increased time to process   LUE Deficits / Details: grossly 3-5 MMT, impaired coordination and functional use, L inattention     Lower  Extremity Assessment Lower Extremity Assessment: RLE deficits/detail;LLE deficits/detail RLE Deficits / Details: generalized weakness, delayed processing/sequencing RLE Coordination: decreased gross motor LLE Deficits / Details: less initiation due to L sided neglect LLE Coordination: decreased gross motor    Cervical / Trunk Assessment Cervical / Trunk Assessment: Normal  Communication   Communication: Expressive difficulties(soft spoken and delayed)  Cognition Arousal/Alertness: Awake/alert Behavior During Therapy: Flat affect Overall Cognitive Status: Impaired/Different from baseline Area of Impairment: Orientation;Attention;Memory;Following commands;Safety/judgement;Awareness;Problem solving                 Orientation Level: Disoriented to;Place;Time;Situation Current Attention Level: Sustained Memory: Decreased short-term memory Following Commands: Follows one step commands inconsistently;Follows one step commands with increased time Safety/Judgement: Decreased awareness of safety;Decreased awareness of deficits Awareness: Intellectual Problem Solving: Slow processing;Decreased initiation;Difficulty sequencing;Requires verbal cues;Requires tactile cues General Comments: inconsistently following commands       General Comments General comments (skin integrity, edema, etc.): VSS    Exercises     Assessment/Plan    PT Assessment Patient needs continued PT services  PT Problem List Decreased strength;Decreased range of motion;Decreased activity tolerance;Decreased balance;Decreased mobility;Decreased coordination;Decreased cognition;Decreased knowledge of use of DME;Decreased safety awareness       PT Treatment Interventions DME instruction;Gait training;Stair training;Functional mobility training;Therapeutic activities;Therapeutic exercise;Balance training;Neuromuscular re-education;Cognitive remediation    PT Goals (Current goals can be found in the Care Plan  section)  Acute Rehab PT Goals Patient Stated Goal: none stated  PT Goal Formulation: With patient/family Time For Goal Achievement: 03/17/18 Potential to Achieve Goals: Good    Frequency Min 4X/week   Barriers to discharge        Co-evaluation PT/OT/SLP Co-Evaluation/Treatment: Yes Reason for Co-Treatment: Complexity of the patient's impairments (multi-system involvement) PT goals addressed during session: Mobility/safety with mobility OT goals addressed during session: ADL's and self-care;Strengthening/ROM       AM-PAC PT "6 Clicks" Mobility  Outcome Measure Help needed turning from your back to your side while in a flat bed without using bedrails?: A Lot Help needed moving from lying on your back to sitting on the side of a flat bed without using bedrails?: A Lot Help needed moving to and from a bed to a chair (including a wheelchair)?: A Lot Help needed standing up from a chair using your arms (e.g., wheelchair or bedside chair)?: A Lot Help needed to walk in hospital room?: Total Help needed climbing 3-5 steps with a railing? : Total 6 Click Score: 10    End of Session Equipment Utilized During Treatment: Gait belt Activity Tolerance: Patient tolerated treatment well Patient left: in bed;with call bell/phone within reach;with family/visitor present Nurse Communication: Mobility status PT Visit Diagnosis: Unsteadiness on feet (R26.81);Muscle weakness (generalized) (M62.81);Difficulty in walking, not elsewhere classified (R26.2)    Time: 1194-1740 PT Time Calculation (min) (ACUTE ONLY): 24 min   Charges:   PT Evaluation $PT Eval Moderate Complexity: 1 Mod          Kittie Plater, PT, DPT Acute Rehabilitation Services Pager #: [phone] Office #: [phone]   Berline Lopes 03/03/2018, 1:09 PM

## 2018-03-03 NOTE — Progress Notes (Signed)
PT Cancellation Note  Patient Details Name: Theresa Ray MRN: [id number] DOB: [birth year]   Cancelled Treatment:    Reason Eval/Treat Not Completed: Patient at procedure or test/unavailable. Pt undergoing EEG. PT to return as able.  Kittie Plater, PT, DPT Acute Rehabilitation Services Pager #: [phone] Office #: [phone]   Berline Lopes 03/03/2018, 9:07 AM

## 2018-03-03 NOTE — Evaluation (Addendum)
Occupational Therapy Evaluation Patient Details Name: Theresa Ray MRN: [id number] DOB: 16-Nov-1945 Today's Date: 03/03/2018    History of Present Illness Ms. Gaeta is a 72 year old lady with history of CAD and TIAs transferred from [facility] due to altered mental status and left-sided weakness was found to have ureteral obstruction status post stent. MRI revealed multifocal acute bilat small infaccts t/p brain compatible with embolic phenomena. 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   Clinical Impression   PTA patient independent with self care and mobility.  Admitted for above and limited by problem list below, including generalized weakness, L neglect/inattention, impaired vision, decreased coordination, impaired balance, and impaired cognition (slow processing and max cueing required).  Patient requires min assist to wash face, max assist for UB ADLs, total assist for LB ADLs, and total assist +2 for bed mobility.  Patient will benefit from continued OT services while admitted and after dc at [facility] in order to optimize return to PLOF with ADLs and mobility.  Will follow.     Follow Up Recommendations  CIR    Equipment Recommendations  Other (comment)(TBD at next venue of care)    Recommendations for Other Services Rehab consult     Precautions / Restrictions Precautions Precautions: Fall Restrictions Weight Bearing Restrictions: No      Mobility Bed Mobility Overal bed mobility: Needs Assistance Bed Mobility: Rolling;Sidelying to Sit;Sit to Sidelying Rolling: Total assist;+2 for physical assistance Sidelying to sit: Total assist;+2 for physical assistance     Sit to sidelying: Total assist;+2 for physical assistance General bed mobility comments: patient with limited initation, motor planning and sequencing, requires total +2, minimal initation of R side with multimodal cueing   Transfers Overall transfer level: Needs assistance Equipment used: 2 person hand held  assist Transfers: Sit to/from Stand Sit to Stand: +2 physical assistance;Max assist         General transfer comment: max assist +2 for sit to stand and side stepping towards [facility] with support for erect posture, mgmt of L side and weight shifting.     Balance Overall balance assessment: Needs assistance Sitting-balance support: No upper extremity supported;Feet supported Sitting balance-Leahy Scale: Fair Sitting balance - Comments: static sitting with min assist for safety   Standing balance support: Bilateral upper extremity supported;During functional activity Standing balance-Leahy Scale: Poor Standing balance comment: reliant on B UE and external support                           ADL either performed or assessed with clinical judgement   ADL Overall ADL's : Needs assistance/impaired     Grooming: Wash/dry face;Minimal assistance;Bed level   Upper Body Bathing: Maximal assistance;Bed level   Lower Body Bathing: Total assistance;+2 for safety/equipment;+2 for physical assistance;Sit to/from stand   Upper Body Dressing : Maximal assistance;Sitting   Lower Body Dressing: Total assistance;+2 for physical assistance;Sit to/from Health and safety inspector Details (indicate cue type and reason): deferred         Functional mobility during ADLs: Total assistance;+2 for physical assistance General ADL Comments: patient significantly limited by cognition, L inattention/neglect and decreased activity tolerance     Vision Baseline Vision/History: Wears glasses Wears Glasses: Reading only Additional Comments: unable to follow formal assessment, patient able to visually scan towards both sides (L side requires increased time), R gaze preference; continue assessment      Perception Perception Perception Tested?: Yes Perception Deficits: Inattention/neglect Inattention/Neglect: Does not attend to  left visual field;Does not attend to left side of body   Praxis       Pertinent Vitals/Pain Pain Assessment: No/denies pain     Hand Dominance Right   Extremity/Trunk Assessment Upper Extremity Assessment Upper Extremity Assessment: LUE deficits/detail;RUE deficits/detail;Difficult to assess due to impaired cognition RUE Deficits / Details: grossly 4/5 MMT, able to use functionally given increased time to process   LUE Deficits / Details: grossly 3-5 MMT, impaired coordination and functional use, L inattention    Lower Extremity Assessment Lower Extremity Assessment: Defer to PT evaluation       Communication Communication Communication: Expressive difficulties(soft spoken and delayed)   Cognition Arousal/Alertness: Awake/alert Behavior During Therapy: Flat affect Overall Cognitive Status: Impaired/Different from baseline Area of Impairment: Orientation;Attention;Memory;Following commands;Safety/judgement;Awareness;Problem solving                 Orientation Level: Disoriented to;Place;Time;Situation Current Attention Level: Sustained Memory: Decreased short-term memory Following Commands: Follows one step commands inconsistently;Follows one step commands with increased time Safety/Judgement: Decreased awareness of safety;Decreased awareness of deficits Awareness: Intellectual Problem Solving: Slow processing;Decreased initiation;Difficulty sequencing;Requires verbal cues;Requires tactile cues General Comments: inconsistently following commands    General Comments  spouse present and supportive, VSS     Exercises     Shoulder Instructions      Home Living Family/patient expects to be discharged to:: Private residence Living Arrangements: Spouse/significant other Available Help at Discharge: Family;Available 24 hours/day Type of Home: House Home Access: Stairs to enter [employer] of Steps: 6-9 Entrance Stairs-Rails: Can reach both Home Layout: One level     Bathroom Shower/Tub: Engineer, site: Standard     Home Equipment: Environmental consultant - 2 wheels;Bedside commode;Hand held shower head          Prior Functioning/Environment Level of Independence: Independent        Comments: independent with mobility and ADLs, no IADLs (some driving)         OT Problem List: Decreased strength;Decreased range of motion;Decreased activity tolerance;Impaired balance (sitting and/or standing);Impaired vision/perception;Decreased coordination;Decreased cognition;Decreased safety awareness;Decreased knowledge of use of DME or AE;Decreased knowledge of precautions;Impaired UE functional use      OT Treatment/Interventions: Self-care/ADL training;Therapeutic exercise;Neuromuscular education;DME and/or AE instruction;Therapeutic activities;Cognitive remediation/compensation;Visual/perceptual remediation/compensation;Patient/family education;Balance training    OT Goals(Current goals can be found in the care plan section) Acute Rehab OT Goals Patient Stated Goal: none stated  Time For Goal Achievement: 03/17/18 Potential to Achieve Goals: Good  OT Frequency: Min 2X/week   Barriers to D/C:            Co-evaluation PT/OT/SLP Co-Evaluation/Treatment: Yes Reason for Co-Treatment: Complexity of the patient's impairments (multi-system involvement) PT goals addressed during session: Mobility/safety with mobility OT goals addressed during session: ADL's and self-care;Strengthening/ROM      AM-PAC OT "6 Clicks" Daily Activity     Outcome Measure Help from another person eating meals?: Total Help from another person taking care of personal grooming?: A Lot Help from another person toileting, which includes using toliet, bedpan, or urinal?: Total Help from another person bathing (including washing, rinsing, drying)?: A Lot Help from another person to put on and taking off regular upper body clothing?: A Lot Help from another person to put on and taking off regular lower body clothing?: Total 6  Click Score: 9   End of Session Equipment Utilized During Treatment: Gait belt;Oxygen Nurse Communication: Mobility status  Activity Tolerance: Patient tolerated treatment well;Patient limited by lethargy Patient left: in bed;with call bell/phone within  reach;with bed alarm set;with family/visitor present;with nursing/sitter in room  OT Visit Diagnosis: Other abnormalities of gait and mobility (R26.89);Muscle weakness (generalized) (M62.81);Other symptoms and signs involving cognitive function;Hemiplegia and hemiparesis;Cognitive communication deficit (R41.841) Symptoms and signs involving cognitive functions: Cerebral infarction Hemiplegia - Right/Left: Left Hemiplegia - dominant/non-dominant: Non-Dominant Hemiplegia - caused by: Cerebral infarction                Time: 4034-7425 OT Time Calculation (min): 36 min Charges:  OT General Charges $OT Visit: 1 Visit OT Evaluation $OT Eval Moderate Complexity: 1 Mod  Delight Stare, OT Acute Rehabilitation Services Pager [phone] Office [phone]   Delight Stare 03/03/2018, 12:20 PM

## 2018-03-03 NOTE — Evaluation (Signed)
Speech Language Pathology Evaluation Patient Details Name: JANMARIE SMOOT MRN: [id number] DOB: 03-09-1946 Today's Date: 03/03/2018 Time: 8101-7510 SLP Time Calculation (min) (ACUTE ONLY): 15 min  Problem List:  Patient Active Problem List   Diagnosis Date Noted  . Severe sepsis ([place]) 03/02/2018  . Acute CVA (cerebrovascular accident) ([place])   . Pyelonephritis   . CAD (coronary artery disease) 05/12/2017  . Postoperative atrial fibrillation ([place]) 05/12/2017  . Orthostatic hypotension 03/28/2017  . Syncope 03/28/2017  . Tobacco abuse 03/18/2017  . Dizziness 03/18/2017  . Pressure injury of skin 02/26/2017  . NSTEMI (non-ST elevated myocardial infarction) ([place]) 02/16/2017  . Type 2 diabetes mellitus with diabetic neuropathy, without long-term current use of insulin ([place])   . Essential hypertension   . Hyperlipidemia    Past Medical History:  Past Medical History:  Diagnosis Date  . CAD in native artery    a. s/p CABGx3 in 01/2017.  Marland Kitchen Chronic diastolic CHF (congestive heart failure) ([place])   . Diabetes mellitus ([place])   . Diabetic neuropathy ([place])   . History of kidney stones    history of stones  . Hypertension   . Hypothyroidism   . NSTEMI (non-ST elevated myocardial infarction) ([place])   . Orthostatic hypotension   . Postoperative atrial fibrillation ([place])    a. after CABG 01/2017.  Marland Kitchen TIA (transient ischemic attack)    Past Surgical History:  Past Surgical History:  Procedure Laterality Date  . ABDOMINAL HYSTERECTOMY    . CORONARY ARTERY BYPASS GRAFT N/A 02/24/2017   Procedure: CORONARY ARTERY BYPASS GRAFTING (CABG) USING LEFT INTERNAL MAMMARY ARTERY TO LAD AND ENDOSCOPICALLY HARVESTED GREATER SAPHENOUS VEIN TO PDA AND TO OM1.;  Surgeon: Grace Isaac, MD;  Location: [place];  Service: Open Heart Surgery;  Laterality: N/A;  . ENDOVEIN HARVEST OF GREATER SAPHENOUS VEIN Right 02/24/2017   Procedure: ENDOVEIN HARVEST OF GREATER SAPHENOUS VEIN;  Surgeon: Grace Isaac,  MD;  Location: [place];  Service: Open Heart Surgery;  Laterality: Right;  . LAPAROSCOPIC GASTRIC BANDING    . LEFT HEART CATH AND CORONARY ANGIOGRAPHY N/A 02/18/2017   Procedure: LEFT HEART CATH AND CORONARY ANGIOGRAPHY;  Surgeon: Leonie Man, MD;  Location: [place] CV LAB;  Service: Cardiovascular;  Laterality: N/A;  . TEE WITHOUT CARDIOVERSION N/A 02/24/2017   Procedure: TRANSESOPHAGEAL ECHOCARDIOGRAM (TEE);  Surgeon: Grace Isaac, MD;  Location: [place];  Service: Open Heart Surgery;  Laterality: N/A;   HPI:  Ms. Hogans is a 72 year old lady with history of CAD and TIAs transferred from [facility] due to altered mental status and left-sided weakness was found to have ureteral obstruction status post stent.  MRI completed with findings of Multifocal acute bilateral small infarcts spanning multiple vascular territories most compatible with embolic phenomena and Moderate to severe chronic small vessel ischemic changes. 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   Assessment / Plan / Recommendation Clinical Impression   Pt presents with severe cognitive deficits characterized by decreased focused attention to tasks which impacts all higher level cognitive processes.  Pt has slowed processing and increased response latency for answering basic close ended questions or following 1 step commands.  She has a right gaze preference but it is difficult to differentiate field cut versus inattention given pt's difficulty following functional commands for visual scanning.  Pt was spontaneously oriented to person and place but not time or situation despite max to total cues.  As a result, pt would benefit from skilled ST while inpatient in  order to maximize functional independence and reduce burden of care prior to discharge.  Anticipate that pt may benefit from inpatient rehab post acute care in addition to 24/7 supervision at discharge.      SLP Assessment  SLP Recommendation/Assessment:  Patient needs continued Speech Lanaguage Pathology Services SLP Visit Diagnosis: Cognitive communication deficit (R41.841)    Follow Up Recommendations  24 hour supervision/assistance;Inpatient Rehab    Frequency and Duration min 2x/week         SLP Evaluation Cognition  Overall Cognitive Status: Impaired/Different from baseline Arousal/Alertness: Awake/alert Orientation Level: Oriented to person;Oriented to place;Disoriented to time Attention: Focused Focused Attention: Impaired Focused Attention Impairment: Verbal basic;Functional basic Memory: Impaired Memory Impairment: Storage deficit Awareness: Impaired Awareness Impairment: Intellectual impairment Problem Solving: Impaired Problem Solving Impairment: Functional basic Executive Function: (all impaired due to lower level deficits) Safety/Judgment: Impaired Comments: left inattention        Comprehension  Auditory Comprehension Overall Auditory Comprehension: Other (comment)(impacted by cognition ) Yes/No Questions: Impaired Basic Biographical Questions: 26-50% accurate Commands: Impaired One Step Basic Commands: 0-24% accurate    Expression Expression Primary Mode of Expression: Verbal Verbal Expression Overall Verbal Expression: Other (comment)(impacted by cognition ) Initiation: Impaired Automatic Speech: Name;Social Response Level of Generative/Spontaneous Verbalization: Word Pragmatics: Impairment Impairments: Abnormal affect;Eye contact Interfering Components: Attention Written Expression Dominant Hand: (P) Right   Oral / Motor  Oral Motor/Sensory Function Overall Oral Motor/Sensory Function: Mild impairment Facial ROM: Reduced left Facial Symmetry: Within Functional Limits Facial Sensation: Other (Comment)(difficult to assess due to cognition ) Motor Speech Overall Motor Speech: Appears within functional limits for tasks assessed   GO                    Windell Moulding L 03/03/2018, 11:04  AM

## 2018-03-03 NOTE — Care Management Note (Signed)
Case Management Note  Patient Details  Name: MADDISYN HEGWOOD MRN: [id number] Date of Birth: 29-Sep-1945  Subjective/Objective:  Ms. Amparo is a 72 year old lady with history of CAD and TIAs transferred from [facility] due to altered mental status and left-sided weakness was found to have ureteral obstruction status post stent. MRI revealed multifocal acute bilat small infaccts t/p brain compatible with embolic phenomena, old BG and R cerebellar infarcts.  PTA, pt independent, lives with spouse.                Action/Plan: PT/OT recommending CIR, and family able to provide care at discharge.  MD: please order rehab consult if you agree with assessment.   Expected Discharge Date:                  Expected Discharge Plan:  [place]  In-House Referral:     Discharge planning Services  CM Consult  Post Acute Care Choice:    Choice offered to:     DME Arranged:    DME Agency:     HH Arranged:    [place] Agency:     Status of Service:  In process, will continue to follow  If discussed at Long Length of Stay Meetings, dates discussed:    Additional Comments:  Reinaldo Raddle, RN, BSN  Trauma/Neuro ICU Case Manager [phone]

## 2018-03-04 ENCOUNTER — Encounter: Payer: Self-pay | Admitting: Occupational Therapy

## 2018-03-04 DIAGNOSIS — E1169 Type 2 diabetes mellitus with other specified complication: Secondary | ICD-10-CM

## 2018-03-04 DIAGNOSIS — E876 Hypokalemia: Secondary | ICD-10-CM

## 2018-03-04 DIAGNOSIS — I951 Orthostatic hypotension: Secondary | ICD-10-CM

## 2018-03-04 DIAGNOSIS — R0682 Tachypnea, not elsewhere classified: Secondary | ICD-10-CM

## 2018-03-04 DIAGNOSIS — E669 Obesity, unspecified: Secondary | ICD-10-CM

## 2018-03-04 DIAGNOSIS — I5032 Chronic diastolic (congestive) heart failure: Secondary | ICD-10-CM

## 2018-03-04 DIAGNOSIS — I25810 Atherosclerosis of coronary artery bypass graft(s) without angina pectoris: Secondary | ICD-10-CM

## 2018-03-04 LAB — CBC
HCT: 34 % — ABNORMAL LOW (ref 36.0–46.0)
Hemoglobin: 11.6 g/dL — ABNORMAL LOW (ref 12.0–15.0)
MCH: 31.2 pg (ref 26.0–34.0)
MCHC: 34.1 g/dL (ref 30.0–36.0)
MCV: 91.4 fL (ref 80.0–100.0)
Platelets: 261 10*3/uL (ref 150–400)
RBC: 3.72 MIL/uL — ABNORMAL LOW (ref 3.87–5.11)
RDW: 12.6 % (ref 11.5–15.5)
WBC: 16.3 10*3/uL — ABNORMAL HIGH (ref 4.0–10.5)
nRBC: 0 % (ref 0.0–0.2)

## 2018-03-04 LAB — GLUCOSE, CAPILLARY
Glucose-Capillary: 118 mg/dL — ABNORMAL HIGH (ref 70–99)
Glucose-Capillary: 140 mg/dL — ABNORMAL HIGH (ref 70–99)
Glucose-Capillary: 187 mg/dL — ABNORMAL HIGH (ref 70–99)
Glucose-Capillary: 188 mg/dL — ABNORMAL HIGH (ref 70–99)
Glucose-Capillary: 187 mg/dL — ABNORMAL HIGH (ref 70–99)

## 2018-03-04 LAB — BASIC METABOLIC PANEL
Anion gap: 14 (ref 5–15)
BUN: 21 mg/dL (ref 8–23)
CO2: 20 mmol/L — AB (ref 22–32)
CREATININE: 1.37 mg/dL — AB (ref 0.44–1.00)
Calcium: 8.2 mg/dL — ABNORMAL LOW (ref 8.9–10.3)
Chloride: 105 mmol/L (ref 98–111)
GFR calc Af Amer: 45 mL/min — ABNORMAL LOW (ref >60)
GFR calc non Af Amer: 39 mL/min — ABNORMAL LOW (ref >60)
Glucose, Bld: 144 mg/dL — ABNORMAL HIGH (ref 70–99)
Potassium: 3.3 mmol/L — ABNORMAL LOW (ref 3.5–5.1)
Sodium: 139 mmol/L (ref 135–145)

## 2018-03-04 LAB — LACTIC ACID, PLASMA: Lactic Acid, Venous: 1.1 mmol/L (ref 0.5–1.9)

## 2018-03-04 MED ORDER — PANTOPRAZOLE SODIUM 40 MG PO TBEC
40.0000 mg | DELAYED_RELEASE_TABLET | Freq: Every day | ORAL | Status: DC
  Administered 2018-03-04 – 2018-03-09 (×6): 40 mg via ORAL
  Filled 2018-03-04 (×6): qty 1

## 2018-03-04 MED ORDER — LEVOTHYROXINE SODIUM 75 MCG PO TABS
175.0000 ug | ORAL_TABLET | Freq: Every day | ORAL | Status: DC
  Administered 2018-03-05 – 2018-03-09 (×5): 175 ug via ORAL
  Filled 2018-03-04 (×5): qty 1

## 2018-03-04 MED ORDER — ASPIRIN EC 81 MG PO TBEC
81.0000 mg | DELAYED_RELEASE_TABLET | Freq: Every day | ORAL | Status: DC
  Administered 2018-03-04 – 2018-03-09 (×6): 81 mg via ORAL
  Filled 2018-03-04 (×6): qty 1

## 2018-03-04 MED ORDER — WHITE PETROLATUM EX OINT
TOPICAL_OINTMENT | CUTANEOUS | Status: AC
  Administered 2018-03-04: 1
  Filled 2018-03-04: qty 28.35

## 2018-03-04 MED ORDER — CITALOPRAM HYDROBROMIDE 10 MG PO TABS
10.0000 mg | ORAL_TABLET | Freq: Every day | ORAL | Status: DC
  Administered 2018-03-04 – 2018-03-09 (×6): 10 mg via ORAL
  Filled 2018-03-04 (×6): qty 1

## 2018-03-04 MED ORDER — ATORVASTATIN CALCIUM 40 MG PO TABS
40.0000 mg | ORAL_TABLET | Freq: Every day | ORAL | Status: DC
  Administered 2018-03-04 – 2018-03-09 (×6): 40 mg via ORAL
  Filled 2018-03-04 (×6): qty 1

## 2018-03-04 MED ORDER — ENSURE ENLIVE PO LIQD
237.0000 mL | Freq: Two times a day (BID) | ORAL | Status: DC
  Administered 2018-03-04: 237 mL via ORAL

## 2018-03-04 MED ORDER — LISINOPRIL 5 MG PO TABS
5.0000 mg | ORAL_TABLET | Freq: Every day | ORAL | Status: DC
  Administered 2018-03-04 – 2018-03-09 (×6): 5 mg via ORAL
  Filled 2018-03-04 (×6): qty 1

## 2018-03-04 MED ORDER — POTASSIUM CHLORIDE 10 MEQ/100ML IV SOLN
10.0000 meq | INTRAVENOUS | Status: AC
  Administered 2018-03-04 (×2): 10 meq via INTRAVENOUS
  Filled 2018-03-04 (×2): qty 100

## 2018-03-04 MED ORDER — CLOPIDOGREL BISULFATE 75 MG PO TABS
75.0000 mg | ORAL_TABLET | Freq: Every day | ORAL | Status: DC
  Administered 2018-03-04 – 2018-03-09 (×6): 75 mg via ORAL
  Filled 2018-03-04 (×6): qty 1

## 2018-03-04 MED ORDER — ADULT MULTIVITAMIN W/MINERALS CH
1.0000 | ORAL_TABLET | Freq: Every day | ORAL | Status: DC
  Administered 2018-03-04 – 2018-03-09 (×5): 1 via ORAL
  Filled 2018-03-04 (×5): qty 1

## 2018-03-04 NOTE — Consult Note (Signed)
I was asked to provide an opinion on this patient.  She  Is being treated for embolic stroke.  She presented to [facility] 2 days ago with left-sided abdominal pain.  She was found to have a 6 mm left upper ureteral stone with hydronephrosis and was subsequently stented before her transfer here.   At the time of her transfer here, her creatinine was 1.87.  It was  1.37 this morning.    She does not need any urgent management of this stent.  I would suggest she follow-up with her urologist in [place]  For further management of her stone.

## 2018-03-04 NOTE — Progress Notes (Signed)
Chaplain responded to call from nurse for Advanced Directive. Chaplain spoke with pt's husband.  Pt was asleep, but woke for tech.  Pt seemed less oriented than advisable to execute AD.  Will continue to be available when patient is fully alert and oriented. Tamsen Snider Pager [phone]

## 2018-03-04 NOTE — Progress Notes (Signed)
STROKE TEAM PROGRESS NOTE   SUBJECTIVE (INTERVAL HISTORY) Her RN is at the bedside. Pt lying in bed, no distress, awake alert and orientated. Passed swallow, on diet. leukocytosis improved and Tmax 100.6. On Abx, BP still high, resume home BP meds.    OBJECTIVE Temp:  [98.1 F (36.7 C)-100.6 F (38.1 C)] 98.4 F (36.9 C) (12/04 0800) Pulse Rate:  [75-119] 82 (12/04 0800) Cardiac Rhythm: Normal sinus rhythm (12/04 0800) Resp:  [0-28] 22 (12/04 0800) BP: (141-199)/(67-105) 183/91 (12/04 0800) SpO2:  [96 %-99 %] 96 % (12/04 0840)  Recent Labs  Lab 03/03/18 1542 03/03/18 1934 03/03/18 2315 03/04/18 0331 03/04/18 0742  GLUCAP 155* 108* 132* 118* 140*   Recent Labs  Lab 03/02/18 2302 03/03/18 0154 03/03/18 1154 03/04/18 0725  NA 136 134* 135 139  K 2.9* 3.1* 3.1* 3.3*  CL 96* 96* 101 105  CO2 22 21* 22 20*  GLUCOSE 224* 210* 190* 144*  BUN 22 23 24* 21  CREATININE 1.87* 1.79* 1.56* 1.37*  CALCIUM 8.6* 8.4* 8.1* 8.2*   Recent Labs  Lab 03/02/18 2302  AST 32  ALT 19  ALKPHOS 41  BILITOT 0.9  PROT 6.9  ALBUMIN 3.3*   Recent Labs  Lab 03/02/18 2302 03/03/18 0154 03/04/18 0725  WBC 22.6* 19.3* 16.3*  NEUTROABS 20.1*  --   --   HGB 13.9 13.2 11.6*  HCT 41.5 39.2 34.0*  MCV 90.8 90.3 91.4  PLT 352 327 261   No results for input(s): CKTOTAL, CKMB, CKMBINDEX, TROPONINI in the last 168 hours. No results for input(s): LABPROT, INR in the last 72 hours. No results for input(s): COLORURINE, LABSPEC, Watson, GLUCOSEU, HGBUR, BILIRUBINUR, KETONESUR, PROTEINUR, UROBILINOGEN, NITRITE, LEUKOCYTESUR in the last 72 hours.  Invalid input(s): APPERANCEUR     Component Value Date/Time   CHOL 167 03/03/2018 1046   TRIG 157 (H) 03/03/2018 1046   HDL 37 (L) 03/03/2018 1046   CHOLHDL 4.5 03/03/2018 1046   VLDL 31 03/03/2018 1046   LDLCALC 99 03/03/2018 1046   Lab Results  Component Value Date   HGBA1C 6.0 (H) 03/03/2018   No results found for: LABOPIA, COCAINSCRNUR,  LABBENZ, AMPHETMU, THCU, LABBARB  No results for input(s): ETH in the last 168 hours.  I have personally reviewed the radiological images below and agree with the radiology interpretations.  Ct Head Wo Contrast  Result Date: 03/02/2018 CLINICAL DATA:  72 year old female with altered mental status. EXAM: CT HEAD WITHOUT CONTRAST TECHNIQUE: Contiguous axial images were obtained from the base of the skull through the vertex without intravenous contrast. COMPARISON:  Head CT dated 03/02/2018 FINDINGS: Brain: There is mild age-related atrophy. Moderate to advanced periventricular and deep white matter hypodensities most consistent with chronic microvascular ischemic changes and similar to prior CT. Left basal ganglia old lacunar infarct. There is no acute intracranial hemorrhage. No mass effect or midline shift. No extra-axial fluid collection. Vascular: No hyperdense vessel or unexpected calcification. Skull: Normal. Negative for fracture or focal lesion. Sinuses/Orbits: Mild mucoperiosteal thickening of paranasal sinuses. No air-fluid levels. The mastoid air cells are clear. Other: None IMPRESSION: 1. No acute intracranial hemorrhage. 2. Age-related atrophy and chronic microvascular ischemic changes. Electronically Signed   By: Anner Crete M.D.   On: 03/02/2018 23:09   Mr Brain Wo Contrast  Result Date: 03/03/2018 CLINICAL DATA:  Altered mental status.  LEFT-sided weakness. EXAM: MRI HEAD WITHOUT CONTRAST TECHNIQUE: Multiplanar, multiecho pulse sequences of the brain and surrounding structures were obtained without intravenous contrast. COMPARISON:  CT HEAD March 02, 2018 and MRI head May 14, 2012. FINDINGS: INTRACRANIAL CONTENTS: Subcentimeter foci reduced diffusion LEFT frontal, RIGHT parietal and bilateral temporal lobes with low ADC values. Small areas of confluent reduced diffusion RIGHT parietal and occipital lobes with corresponding low ADC values. Susceptibility artifact associated with  bilateral old basal ganglia infarcts. Old small RIGHT inferior cerebellar infarct. No susceptibility artifact to suggest interval hemorrhage. Confluent supratentorial white matter FLAIR T2 hyperintensities. Mild ex vacuo dilatation bilateral lateral ventricles. No hydrocephalus. No abnormal extra-axial fluid collections. VASCULAR: Normal major intracranial vascular flow voids present at skull base. SKULL AND UPPER CERVICAL SPINE: No abnormal sellar expansion. No suspicious calvarial bone marrow signal. Craniocervical junction maintained. SINUSES/ORBITS: The mastoid air-cells and included paranasal sinuses are well-aerated.The included ocular globes and orbital contents are non-suspicious. OTHER: None. IMPRESSION: 1. Multifocal acute bilateral small infarcts spanning multiple vascular territories most compatible with embolic phenomena. 2. Moderate to severe chronic small vessel ischemic changes. 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 Critical Value/emergent results text paged to [facility] via [employer] secure system on 03/03/2018 at 5:57 am, including interpreting physician's phone number. Electronically Signed   By: Elon Alas M.D.   On: 03/03/2018 05:58     PHYSICAL EXAM  Temp:  [98.1 F (36.7 C)-100.6 F (38.1 C)] 98.4 F (36.9 C) (12/04 0800) Pulse Rate:  [75-119] 82 (12/04 0800) Resp:  [0-28] 22 (12/04 0800) BP: (141-199)/(67-105) 183/91 (12/04 0800) SpO2:  [96 %-99 %] 96 % (12/04 0840)  General - Well nourished, well developed, in no apparent distress.  Ophthalmologic - fundi not visualized due to noncooperation.  Cardiovascular - Regular rate and rhythm.  Neuro - awake, alert, lying in bed, orientated to place, year and people, as well as self and age, but not orientated to month.  Follow all simple commands, naming 2/2 and able to repeat.  No dysarthria.  PERRL, able to attend to both sides, but not cooperative on visual field test with confrontation. Blinking to  visual threat on the right, inconsistently blinking to visual threat on the left. Facial symmetrical, tongue midline. Bilateral upper extremity 4/5, BLE 3/5 proximal and 4/5 distal. Mild left UE and LE neglect.  DTR 1+, Babinski positive on the left.  Sensation symmetrical and coordination intact BUE.  Gait not tested.   ASSESSMENT/PLAN Ms. Theresa Ray is a 72 y.o. female with history of hypertension, diabetes, hyperlipidemia, CHF, CAD/MI, postop A. fib, admitted for altered mental status and left-sided weakness. No tPA given due to outside window.    Stroke: Acute right MCA, MCA/PCA, MCA/ACA and left punctate MCA infarcts, cardioembolic pattern, source unclear, concerning for endocarditis versus undiagnosed A. fib  Resultant psychomotor slowing, high-grade fever  CT head no acute abnormality  MRI acute right MCA and punctate left MCA infarcts, chronic bilateral BG infarcts.  CT head and neck no LVO, moderate stenosis right V4, left A2, bilateral M2/M3 and left P2  2D Echo EF 50-55%  EEG no seizure, general slowing  TEE pending to rule out endocarditis  LDL 99  HgbA1c 6.0  Lovenox for VTE prophylaxis  N.p.o. for now  aspirin 81 mg daily and clopidogrel 75 mg daily prior to admission, now on ASA 81mg  and plavix 75mg  home meds.   Ongoing aggressive stroke risk factor management  Therapy recommendations:  Pending   Disposition:  Pending   Sepsis, source unclear, improving  T-max 102.3->100.6  Blood culture NGTD  Leukocytosis WBC 22.6->19.3->16.3  Lactic acid 4.2->2.0->1.1  On Rocephin empiric treatment  CCM on board  Left ureteral stone with hydronephrosis status post stent  CT abdomen showed left upper ureteral stone with ureteral obstruction  Status post double-J stent  Urology recommend on further acute management and signed off  CAD with history of non-STEMI status post CABG  01/2017 no STEMI status post CABG  On aspirin Plavix PTA  Postop A. fib  on amiodarone, converted to NSR  Follow with Dr. Angelena Form as outpatient  Episodes of aphasia and syncope  2 months ago one episode of aphasia but recovered quickly  1 months ago episode of syncope with hot shower  Several months ago episode of gait difficulty with staring spells and amnesia  Had 30-day cardiac event monitoring in 04/20/2017 and 01/18/2018 showed no A. Fib  EEG no seizure  Patient denies any heart palpitation or racing heart  If TEE showed no endocarditis, will discuss with Dr. Angelena Form to consider loop recorder versus anticoagulation  Diabetes  HgbA1c 6.0 goal < 7.0  Glucose fluctuating  CBG monitoring  SSI  DM education and close PCP follow up  Hypertension . Stable on the high end . Gradually normalize BP . Resume  Home meds of coreg and lisinopril  Long term BP goal normotensive  Hyperlipidemia  Home meds: Lipitor 40  LDL 99, goal < 70  Now on lipitor 40 home meds  Continue statin on discharge  Dysphagia, improved  passed swallow today  On diet  Resume home meds  Speech to follow  Other Stroke Risk Factors  Advanced age  Obesity, Body mass index is 31.4 kg/m.   Hx stroke/TIA by MRI chronic bilateral BG infarcts  Other Active Problems  Hypokalemia potassium 3.1->3.3, supplement  Elevated creatinine 1.87-1.79->1.37, continue hydration  Hyperglycemia, stable  Hospital day # 2  This patient is critically ill due to sepsis, embolic stroke, ureteral obstruction, postop A. fib, and at significant risk of neurological worsening, death form recurrent stroke, septic shock, hemorrhagic conversion, heart failure, seizure. This patient's care requires constant monitoring of vital signs, hemodynamics, respiratory and cardiac monitoring, review of multiple databases, neurological assessment, discussion with family, other specialists and medical decision making of high complexity. I spent 35 minutes of neurocritical care time in the  care of this patient.   Theresa Hawking, MD PhD Stroke Neurology 03/04/2018 9:36 AM    To contact Stroke Continuity provider, please refer to [email or web address] After hours, contact General Neurology

## 2018-03-04 NOTE — Consult Note (Signed)
Physical Medicine and Rehabilitation Consult Reason for Consult: Left side weakness Referring Physician: triad   HPI: Theresa Ray is a 72 y.o.right-handed female with history of CAD/CABG 2018 maintained on aspirin and Plavix, chronic diastolic congestive heart failure, diabetes mellitus,  orthostatic hypotension. Per chart review, patient, and husband, patient lives with spouse. Independent prior to admission. One level home with 6-9 steps to entry. Husband still works. Presented 03/02/2018 with altered mental status of left side weakness to [facility] showed no large vessel occlusion.Patient did not receive TPA. Noted leukocytosis as well as creatinine 1.87 with CT of abdomen being completed showing left urteric obstruction secondary to kidney stone was taken to the OR for a double-J stent. Patient then transferred to [facility] for further evaluation of left side weakness. MRI reviewed, showing bilateral infarcts.  Per report, CT/MRI completed showing multifocal acute bilateral small infarct spanning multiple vascular territories. 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 Echocardiogram with ejection fraction of 55%. Systolic function was normal. TEE is pending. Neurology follow-up  Patient currently remains on aspirin and Plavix for CVA prophylaxis. Plans discussed with Cards. Therapy evaluations completed with recommendations of physical medicine rehabilitation consult.   Review of Systems  Constitutional: Negative for chills and fever.  HENT: Negative for hearing loss.   Eyes: Positive for blurred vision. Negative for double vision.  Respiratory: Negative for cough and shortness of breath.   Cardiovascular: Positive for leg swelling. Negative for chest pain and palpitations.  Gastrointestinal: Positive for constipation. Negative for nausea and vomiting.  Genitourinary: Negative for dysuria, flank pain  and hematuria.  Musculoskeletal: Positive for joint pain and myalgias.  Skin: Negative for rash.  Neurological: Positive for focal weakness. Negative for sensory change and speech change.  All other systems reviewed and are negative.  Past Medical History:  Diagnosis Date  . CAD in native artery    a. s/p CABGx3 in 01/2017.  Marland Kitchen Chronic diastolic CHF (congestive heart failure) ([place])   . Diabetes mellitus ([place])   . Diabetic neuropathy ([place])   . History of kidney stones    history of stones  . Hypertension   . Hypothyroidism   . NSTEMI (non-ST elevated myocardial infarction) ([place])   . Orthostatic hypotension   . Postoperative atrial fibrillation ([place])    a. after CABG 01/2017.  Marland Kitchen TIA (transient ischemic attack)    Past Surgical History:  Procedure Laterality Date  . ABDOMINAL HYSTERECTOMY    . CORONARY ARTERY BYPASS GRAFT N/A 02/24/2017   Procedure: CORONARY ARTERY BYPASS GRAFTING (CABG) USING LEFT INTERNAL MAMMARY ARTERY TO LAD AND ENDOSCOPICALLY HARVESTED GREATER SAPHENOUS VEIN TO PDA AND TO OM1.;  Surgeon: Grace Isaac, MD;  Location: [place];  Service: Open Heart Surgery;  Laterality: N/A;  . ENDOVEIN HARVEST OF GREATER SAPHENOUS VEIN Right 02/24/2017   Procedure: ENDOVEIN HARVEST OF GREATER SAPHENOUS VEIN;  Surgeon: Grace Isaac, MD;  Location: [place];  Service: Open Heart Surgery;  Laterality: Right;  . LAPAROSCOPIC GASTRIC BANDING    . LEFT HEART CATH AND CORONARY ANGIOGRAPHY N/A 02/18/2017   Procedure: LEFT HEART CATH AND CORONARY ANGIOGRAPHY;  Surgeon: Leonie Man, MD;  Location: [place] [facility];  Service: Cardiovascular;  Laterality: N/A;  . TEE WITHOUT CARDIOVERSION N/A 02/24/2017   Procedure: TRANSESOPHAGEAL ECHOCARDIOGRAM (TEE);  Surgeon: Grace Isaac, MD;  Location: [place];  Service: Open Heart Surgery;  Laterality: N/A;   Family History  Problem Relation Age of Onset  . Heart disease Mother 46       CABG  . CVA Father    Social History:   reports that she quit smoking about 44 years ago. Her smoking use included cigarettes. She started smoking about 52 years ago. She has never used smokeless tobacco. She reports that she does not drink alcohol or use drugs. Allergies:  Allergies  Allergen Reactions  . Sulfa Antibiotics Nausea And Vomiting  . Shellfish Allergy Other (See Comments)    UNKNOWN REACTION.  Allergy found on ALLERGY TEST.  . Aggrenox [Aspirin-Dipyridamole Er] Other (See Comments)    Redness in eyes, takes aspirin 325 at home at baseline   Medications Prior to Admission  Medication Sig Dispense Refill  . acetaminophen (TYLENOL) 325 MG tablet Take 2 tablets (650 mg total) by mouth every 6 (six) hours as needed for mild pain.    Marland Kitchen aspirin EC 81 MG tablet Take 1 tablet (81 mg total) by mouth daily. 90 tablet 3  . atorvastatin (LIPITOR) 40 MG tablet Take 1 tablet (40 mg total) by mouth daily. 90 tablet 3  . Carboxymethylcellul-Glycerin (CLEAR EYES FOR DRY EYES) 1-0.25 % SOLN Place 1 drop into both eyes as needed.    . carvedilol (COREG) 12.5 MG tablet Take 1 tablet (12.5 mg total) by mouth 2 (two) times daily. 60 tablet 11  . cholecalciferol (VITAMIN D) 1000 units tablet Take 3,000 Units 2 (two) times daily by mouth.    . clopidogrel (PLAVIX) 75 MG tablet Take 1 tablet (75 mg total) by mouth daily. 90 tablet 3  . co-enzyme Q-10 30 MG capsule Take 30 mg daily by mouth.    Marland Kitchen glipiZIDE (GLUCOTROL) 10 MG tablet Take 10 mg 2 (two) times daily by mouth.    . Glucosamine-Chondroit-Vit C-Mn (GLUCOSAMINE CHONDR 500 COMPLEX PO) Take 1 tablet 2 (two) times daily by mouth.    Javier Docker Oil 1000 MG CAPS Take 1,000 mg daily by mouth.    . levothyroxine (SYNTHROID, LEVOTHROID) 175 MCG tablet Take 175 mcg daily before breakfast by mouth.    Marland Kitchen lisinopril (PRINIVIL,ZESTRIL) 5 MG tablet Take 1 tablet (5 mg total) by mouth daily. 30 tablet 3  . MELATONIN PO Take 10 mg at bedtime by mouth.    . metFORMIN (GLUCOPHAGE) 1000 MG tablet Take  1,000 mg 2 (two) times daily by mouth.    . Multiple Vitamin (MULTIVITAMIN WITH MINERALS) TABS tablet Take 1 tablet daily by mouth.    . traMADol (ULTRAM) 50 MG tablet Take 50 mg by mouth 2 (two) times daily. For restless leg  1    Home: Home Living Family/patient expects to be discharged to:: Private residence Living Arrangements: Spouse/significant other Available Help at Discharge: Family, Available 24 hours/day Type of Home: House Home Access: Stairs to enter [employer] of Steps: 6-9 Entrance Stairs-Rails: Can reach both Home Layout: One level Bathroom Shower/Tub: Chiropodist: Standard Home Equipment: Environmental consultant - 2 wheels, Bedside commode, Hand held shower head  Functional History: Prior Function Level of Independence: Independent Comments: independent with mobility and ADLs, no IADLs (some driving)  Functional Status:  Mobility: Bed Mobility Overal bed mobility: Needs Assistance Bed Mobility: Supine to Sit Rolling: Total assist, +2 for physical assistance Sidelying to sit: Total assist, +2 for physical assistance Supine to sit: Mod assist Sit to sidelying: Total assist, +2 for physical assistance General bed mobility comments: max directional verbal cues to complete task step by step, pt initiated  moving LEs off EOB, modA for trunk elevation and to bring hips to EOB Transfers Overall transfer level: Needs assistance Equipment used: 2 person hand held assist Transfers: Sit to/from Stand Sit to Stand: Mod assist, +2 physical assistance General transfer comment: pt assisted to floor due to high surface height, max tactile and verbal cues to achieve trunk extension and full upright posture Ambulation/Gait Ambulation/Gait assistance: Mod assist, +2 safety/equipment Gait Distance (Feet): 150 Feet Assistive device: Rolling walker (2 wheeled) Gait Pattern/deviations: Step-through pattern, Decreased stride length, Decreased dorsiflexion -  left General Gait Details: max directional verbal cues, pt with R sided gaze preferance requiring max directional verbal cues to turn to the left and look out for obstacles on the L. modA for walker management, modA for forward progression Gait velocity: slow Gait velocity interpretation: <1.8 ft/sec, indicate of risk for recurrent falls    ADL: ADL Overall ADL's : Needs assistance/impaired Grooming: Wash/dry face, Minimal assistance, Bed level Upper Body Bathing: Maximal assistance, Bed level Lower Body Bathing: Total assistance, +2 for safety/equipment, +2 for physical assistance, Sit to/from stand Upper Body Dressing : Maximal assistance, Sitting Lower Body Dressing: Total assistance, +2 for physical assistance, Sit to/from stand Toilet Transfer Details (indicate cue type and reason): deferred Functional mobility during ADLs: Total assistance, +2 for physical assistance General ADL Comments: patient significantly limited by cognition, L inattention/neglect and decreased activity tolerance  Cognition: Cognition Overall Cognitive Status: Impaired/Different from baseline Arousal/Alertness: Awake/alert Orientation Level: Oriented to person, Disoriented to place, Disoriented to time, Disoriented to situation Attention: Focused Focused Attention: Impaired Focused Attention Impairment: Verbal basic, Functional basic Memory: Impaired Memory Impairment: Storage deficit Awareness: Impaired Awareness Impairment: Intellectual impairment Problem Solving: Impaired Problem Solving Impairment: Functional basic Executive Function: (all impaired due to lower level deficits) Safety/Judgment: Impaired Comments: left inattention  Cognition Arousal/Alertness: Awake/alert Behavior During Therapy: Flat affect Overall Cognitive Status: Impaired/Different from baseline Area of Impairment: Orientation, Attention, Memory, Following commands, Safety/judgement, Awareness, Problem solving Orientation  Level: (pt oriented to hospital and asked when she had the stroke) Current Attention Level: Sustained Memory: Decreased short-term memory Following Commands: Follows one step commands with increased time Safety/Judgement: Decreased awareness of safety, Decreased awareness of deficits Awareness: Intellectual Problem Solving: Slow processing, Decreased initiation, Difficulty sequencing, Requires verbal cues, Requires tactile cues General Comments: inconsistently following commands   Blood pressure (!) 145/75, pulse 64, temperature 98.5 F (36.9 C), temperature source Oral, resp. rate 19, height 5\' 5"  (1.651 m), weight 85.6 kg, SpO2 95 %. Physical Exam  Vitals reviewed. Constitutional: She is oriented to person, place, and time. She appears well-developed.  Obese  HENT:  Head: Normocephalic and atraumatic.  Eyes: EOM are normal. Right eye exhibits no discharge. Left eye exhibits no discharge.  Pupils reactive to light  Neck: Normal range of motion. Neck supple. No thyromegaly present.  Cardiovascular: Normal rate and regular rhythm.  Respiratory: Effort normal and breath sounds normal. No respiratory distress.  GI: Soft. Bowel sounds are normal. She exhibits no distension.  Musculoskeletal:  No edema or tenderness in extremities  Neurological: She is alert and oriented to person, place, and time.  Follows simple commands. Appears to be easily distracted needs to be redirected. She does provide her name and age. Motor: LUE/LLE: 4+/5 proximal to distal RUE/RLE: 4/5 proximal to distal  Skin: Skin is warm and dry.  Psychiatric: Her affect is blunt. Her speech is delayed. She is slowed.    Results for orders placed or performed during the hospital encounter of 03/02/18 (  from the past 24 hour(s))  Lactic acid, plasma     Status: Abnormal   Collection Time: 03/03/18  1:47 PM  Result Value Ref Range   Lactic Acid, Venous 2.0 (HH) 0.5 - 1.9 mmol/L  Glucose, capillary     Status: Abnormal    Collection Time: 03/03/18  3:42 PM  Result Value Ref Range   Glucose-Capillary 155 (H) 70 - 99 mg/dL   Comment 1 Notify RN    Comment 2 Document in Chart   Glucose, capillary     Status: Abnormal   Collection Time: 03/03/18  7:34 PM  Result Value Ref Range   Glucose-Capillary 108 (H) 70 - 99 mg/dL  Glucose, capillary     Status: Abnormal   Collection Time: 03/03/18 11:15 PM  Result Value Ref Range   Glucose-Capillary 132 (H) 70 - 99 mg/dL  Glucose, capillary     Status: Abnormal   Collection Time: 03/04/18  3:31 AM  Result Value Ref Range   Glucose-Capillary 118 (H) 70 - 99 mg/dL  CBC     Status: Abnormal   Collection Time: 03/04/18  7:25 AM  Result Value Ref Range   WBC 16.3 (H) 4.0 - 10.5 K/uL   RBC 3.72 (L) 3.87 - 5.11 MIL/uL   Hemoglobin 11.6 (L) 12.0 - 15.0 g/dL   HCT 34.0 (L) 36.0 - 46.0 %   MCV 91.4 80.0 - 100.0 fL   MCH 31.2 26.0 - 34.0 pg   MCHC 34.1 30.0 - 36.0 g/dL   RDW 12.6 11.5 - 15.5 %   Platelets 261 150 - 400 K/uL   nRBC 0.0 0.0 - 0.2 %  Basic metabolic panel     Status: Abnormal   Collection Time: 03/04/18  7:25 AM  Result Value Ref Range   Sodium 139 135 - 145 mmol/L   Potassium 3.3 (L) 3.5 - 5.1 mmol/L   Chloride 105 98 - 111 mmol/L   CO2 20 (L) 22 - 32 mmol/L   Glucose, Bld 144 (H) 70 - 99 mg/dL   BUN 21 8 - 23 mg/dL   Creatinine, Ser 1.37 (H) 0.44 - 1.00 mg/dL   Calcium 8.2 (L) 8.9 - 10.3 mg/dL   GFR calc non Af Amer 39 (L) >60 mL/min   GFR calc Af Amer 45 (L) >60 mL/min   Anion gap 14 5 - 15  Lactic acid, plasma     Status: None   Collection Time: 03/04/18  7:25 AM  Result Value Ref Range   Lactic Acid, Venous 1.1 0.5 - 1.9 mmol/L  Glucose, capillary     Status: Abnormal   Collection Time: 03/04/18  7:42 AM  Result Value Ref Range   Glucose-Capillary 140 (H) 70 - 99 mg/dL   Comment 1 Notify RN    Comment 2 Document in Chart   Glucose, capillary     Status: Abnormal   Collection Time: 03/04/18 11:14 AM  Result Value Ref Range    Glucose-Capillary 187 (H) 70 - 99 mg/dL   Comment 1 Notify RN    Comment 2 Document in Chart    Ct Head Wo Contrast  Result Date: 03/02/2018 CLINICAL DATA:  72 year old female with altered mental status. EXAM: CT HEAD WITHOUT CONTRAST TECHNIQUE: Contiguous axial images were obtained from the base of the skull through the vertex without intravenous contrast. COMPARISON:  Head CT dated 03/02/2018 FINDINGS: Brain: There is mild age-related atrophy. Moderate to advanced periventricular and deep white matter hypodensities most consistent with chronic microvascular  ischemic changes and similar to prior CT. Left basal ganglia old lacunar infarct. There is no acute intracranial hemorrhage. No mass effect or midline shift. No extra-axial fluid collection. Vascular: No hyperdense vessel or unexpected calcification. Skull: Normal. Negative for fracture or focal lesion. Sinuses/Orbits: Mild mucoperiosteal thickening of paranasal sinuses. No air-fluid levels. The mastoid air cells are clear. Other: None IMPRESSION: 1. No acute intracranial hemorrhage. 2. Age-related atrophy and chronic microvascular ischemic changes. Electronically Signed   By: Anner Crete M.D.   On: 03/02/2018 23:09   Mr Brain Wo Contrast  Result Date: 03/03/2018 CLINICAL DATA:  Altered mental status.  LEFT-sided weakness. EXAM: MRI HEAD WITHOUT CONTRAST TECHNIQUE: Multiplanar, multiecho pulse sequences of the brain and surrounding structures were obtained without intravenous contrast. COMPARISON:  CT HEAD March 02, 2018 and MRI head May 14, 2012. FINDINGS: INTRACRANIAL CONTENTS: Subcentimeter foci reduced diffusion LEFT frontal, RIGHT parietal and bilateral temporal lobes with low ADC values. Small areas of confluent reduced diffusion RIGHT parietal and occipital lobes with corresponding low ADC values. Susceptibility artifact associated with bilateral old basal ganglia infarcts. Old small RIGHT inferior cerebellar infarct. No  susceptibility artifact to suggest interval hemorrhage. Confluent supratentorial white matter FLAIR T2 hyperintensities. Mild ex vacuo dilatation bilateral lateral ventricles. No hydrocephalus. No abnormal extra-axial fluid collections. VASCULAR: Normal major intracranial vascular flow voids present at skull base. SKULL AND UPPER CERVICAL SPINE: No abnormal sellar expansion. No suspicious calvarial bone marrow signal. Craniocervical junction maintained. SINUSES/ORBITS: The mastoid air-cells and included paranasal sinuses are well-aerated.The included ocular globes and orbital contents are non-suspicious. OTHER: None. IMPRESSION: 1. Multifocal acute bilateral small infarcts spanning multiple vascular territories most compatible with embolic phenomena. 2. Moderate to severe chronic small vessel ischemic changes. 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 Critical Value/emergent results text paged to [place] via [employer] secure system on 03/03/2018 at 5:57 am, including interpreting physician's phone number. Electronically Signed   By: Elon Alas M.D.   On: 03/03/2018 05:58    Assessment/Plan: Diagnosis: Bilateral infarcts Labs and images (see above) independently reviewed.  Records reviewed and summated above. Stroke: Continue secondary stroke prophylaxis and Risk Factor Modification listed below:   Antiplatelet therapy:   Blood Pressure Management:  Continue current medication with prn's with permisive HTN per primary team Statin Agent:   Diabetes management:   Right sided hemiparesis: fit for orthosis to prevent contractures  Motor recovery: Fluoxetine  1. Does the need for close, 24 hr/day medical supervision in concert with the patient's rehab needs make it unreasonable for this patient to be served in a less intensive setting? Yes  2. Co-Morbidities requiring supervision/potential complications: CAD/CABG 6789 (cont meds), chronic diastolic congestive heart failure (monitor  signs/symptoms of fluid overload), DM (Monitor in accordance with exercise and adjust meds as necessary), orthostatic hypotension, tachypnea (monitor RR and O2 Sats with increased physical exertion), hypokalemia (continue to monitor and replete as necessary) 3. Due to safety, disease management and patient education, does the patient require 24 hr/day rehab nursing? Yes 4. Does the patient require coordinated care of a physician, rehab nurse, PT (1-2 hrs/day, 5 days/week), OT (1-2 hrs/day, 5 days/week) and SLP (1-2 hrs/day, 5 days/week) to address physical and functional deficits in the context of the above medical diagnosis(es)? Yes Addressing deficits in the following areas: balance, endurance, locomotion, strength, transferring, bathing, dressing, toileting, cognition, speech, swallowing and psychosocial support 5. Can the patient actively participate in an intensive therapy program of at least 3 hrs of therapy per day at least  5 days per week? Potentially 6. The potential for patient to make measurable gains while on inpatient rehab is excellent 7. Anticipated functional outcomes upon discharge from inpatient rehab are supervision  with PT, supervision with OT, modified independent and supervision with SLP. 8. Estimated rehab length of stay to reach the above functional goals is: 15-18 days. 9. Anticipated D/C setting: Home 10. Anticipated post D/C treatments: HH therapy and Home excercise program 11. Overall Rehab/Functional Prognosis: good  RECOMMENDATIONS: This patient's condition is appropriate for continued rehabilitative care in the following setting: CIR when medically stable after completion of workup. Patient has agreed to participate in recommended program. Potentially Note that insurance prior authorization may be required for reimbursement for recommended care.  Comment: Rehab Admissions Coordinator to follow up.   I have personally performed a face to face diagnostic evaluation,  including, but not limited to relevant history and physical exam findings, of this patient and developed relevant assessment and plan.  Additionally, I have reviewed and concur with the physician assistant's documentation above.   Delice Lesch, MD, ABPMR Lavon Paganini Angiulli, PA-C 03/04/2018

## 2018-03-04 NOTE — Progress Notes (Signed)
PULMONARY / CRITICAL CARE MEDICINE   NAME:  Theresa Ray, MRN:  [id number], DOB:  Aug 25, 1945, LOS: 2 ADMISSION DATE:  03/02/2018, CONSULTATION DATE: 03/02/2018 REFERRING MD: [facility], CHIEF COMPLAINT: Altered mental status  BRIEF HISTORY:    72 year old female with CAD s/p CABG, hx TIAs, HTN, HLD and DM who presented to [facility] for altered mental status, left sided weakness and severe abdominal plain and found with left ureteral obstruction requiring double-J stent. She was transferred to [facility] ICU for altered mental status.  Initial CT head negative for acute intracranial hemorrhage however MRI demonstrated small multifocal infarcts concerning for embolic stroke.  SIGNIFICANT PAST MEDICAL HISTORY   -Coronary artery disease status post CABG x3 -Hypertension -Hyperlipidemia -TIAs -Diabetes mellitus   SIGNIFICANT EVENTS:  12/2 Presented to OSH with AMS, left sided weakness and abdominal pain. CT Abdomen found left ureteral obstruction requiring double-J stent placement 12/3 Transferred to [facility] for suspected CVA. MR demonstrated multifocal infarcts  STUDIES:   CTA of the head 03/02/2018: negative for large vessel occlusion and no significant atherosclerosis there was mild to moderate stenosis of bilateral ICA and bilateral PCA  CT abdomen 03/02/2018 showing left ureteric obstruction secondary to kidney stone with ruptured fornix  CT Head 03/02/18: No intracranial hemorrhage  MR Brain 03/03/18: 1. Multifocal acute bilateral small infarcts spanning multiple vascular territories most compatible with embolic phenomena. 2. Moderate to severe chronic small vessel ischemic changes. 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  EEG - Focal R slowing. No seizure activity CULTURES:  BCx 12/3>>NGTD  ANTIBIOTICS:  Ceftriaxone>>  LINES/TUBES:  PIV x 2 12/2>> CONSULTANTS:  Neurology   SUBJECTIVE:  No events overnight. Intermittently awake and follows commands. Denies  shortness of breath or chest pain  CONSTITUTIONAL: BP (!) 188/89   Pulse 85   Temp 98.3 F (36.8 C) (Oral)   Resp (!) 23   Ht 5\' 5"  (1.651 m)   Wt 85.6 kg   LMP  (LMP Unknown)   SpO2 98%   BMI 31.40 kg/m   I/O last 3 completed shifts: In: 6146.5 [I.V.:3080.4; IV Piggyback:3066.2] Out: 2625 [Urine:2625]    PHYSICAL EXAM: General: Drowsy but will respond to voice. NAD HENT: [place], AT Eyes: EOMI, no scleral icterus Cardiovascular: RRR, no m/r/g Lungs: CTABL, no wheezing or rhonchi Abdomen: BS+, soft, NTTP Musculoskeletal: No edema in extremities, distal pulses palpable Neuro: Awake and oriented to self and place. Follows commands. Upper and lower extremity strength 4/5 bilaterally Skin: No rash or bruising GU: Sutures in place  RESOLVED PROBLEM LIST   ASSESSMENT AND PLAN   72 year old female transferred for altered mental status and left sided weakness concerning for stroke  #Multiple CVA infarcts TTE with inferior/inferolateral wall hypokinesis, mild-mod MR, no vegetations. EEG with no seizure activity seen. --Plan for TEE --Swallow evaluation --Will start statin when PO access available --Hold antiplatelet/anticoagulation for possible endocarditis --Permissive hypertension <220/120 48 hours post stroke  --Neurology following  #Severe sepsis #Acute pyelonephritis secondary to left UPJ obstruction s/p double J stent placement on 12/2 at [facility] --Continue Ceftriaxone --F/u OSH cultures if available to tailor antibiotic therapy --Trend lactic acid --Consult Urology for management of double-J stent  AKI - improving Likely secondary to ureteral obstruction. Expect improvement post stent placement --Serial BMP  CAD/NSTEMI s/p CABG in 2018 --Hold home DAPT --Consult Cardiology for TEE  Hypokalemia --Replete today, goal >4 --Serial BMP  DM HA1c 6 --POCT CBG q4h while NPO --Moderate SSI   SUMMARY OF TODAY'S  PLAN:  TEE Swallow Eval  Best Practice / Goals  of Care / Disposition.   DVT PROPHYLAXIS: Lovenox SUP: None NUTRITION: N.p.o.  mOBILITY: Bedrest GOALS OF CARE: Full code FAMILY DISCUSSIONS: Discussed with husband at bedside DISPOSITION ICU admission  LABS  Glucose Recent Labs  Lab 03/03/18 1218 03/03/18 1542 03/03/18 1934 03/03/18 2315 03/04/18 0331 03/04/18 0742  GLUCAP 186* 155* 108* 132* 118* 140*    BMET Recent Labs  Lab 03/02/18 2302 03/03/18 0154 03/03/18 1154  NA 136 134* 135  K 2.9* 3.1* 3.1*  CL 96* 96* 101  CO2 22 21* 22  BUN 22 23 24*  CREATININE 1.87* 1.79* 1.56*  GLUCOSE 224* 210* 190*    Liver Enzymes Recent Labs  Lab 03/02/18 2302  AST 32  ALT 19  ALKPHOS 41  BILITOT 0.9  ALBUMIN 3.3*    Electrolytes Recent Labs  Lab 03/02/18 2302 03/03/18 0154 03/03/18 1154  CALCIUM 8.6* 8.4* 8.1*    CBC Recent Labs  Lab 03/02/18 2302 03/03/18 0154  WBC 22.6* 19.3*  HGB 13.9 13.2  HCT 41.5 39.2  PLT 352 327    ABG No results for input(s): PHART, PCO2ART, PO2ART in the last 168 hours.  Coag's No results for input(s): APTT, INR in the last 168 hours.  Sepsis Markers Recent Labs  Lab 03/03/18 0154 03/03/18 1154 03/03/18 1347  LATICACIDVEN 4.2* 1.8 2.0*   TTE 03/04/18 reviewed: Hypokinesis of inferior/inferolateral walls with low normal LV systolic function

## 2018-03-04 NOTE — Progress Notes (Signed)
Physical Therapy Treatment Patient Details Name: Theresa Ray MRN: [id number] DOB: 1945/09/15 Today's Date: 03/04/2018    History of Present Illness Ms. Picazo is a 72 year old lady with history of CAD and TIAs transferred from [facility] due to altered mental status and left-sided weakness was found to have ureteral obstruction status post stent. MRI revealed multifocal acute bilat small infaccts t/p brain compatible with embolic phenomena. 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    PT Comments    Pt with significant cognitive and functional improvement today. Pt remains slow to respond but is oriented. Pt cont to have L sided neglect but is crossing midline when cued. Pt able to tolerate ambulation today with modAx2, use of RW and max encouragement. Pt remains appropriate for CIR upon d/c for maximal functional return. Acute PT to cont to follow.    Follow Up Recommendations  CIR     Equipment Recommendations  (TBD)    Recommendations for Other Services Rehab consult     Precautions / Restrictions Precautions Precautions: Fall Precaution Comments: L sided neglect Restrictions Weight Bearing Restrictions: No    Mobility  Bed Mobility Overal bed mobility: Needs Assistance Bed Mobility: Supine to Sit     Supine to sit: Mod assist     General bed mobility comments: max directional verbal cues to complete task step by step, pt initiated moving LEs off EOB, modA for trunk elevation and to bring hips to EOB  Transfers Overall transfer level: Needs assistance Equipment used: 2 person hand held assist Transfers: Sit to/from Stand Sit to Stand: Mod assist;+2 physical assistance         General transfer comment: pt assisted to floor due to high surface height, max tactile and verbal cues to achieve trunk extension and full upright posture  Ambulation/Gait Ambulation/Gait assistance: Mod assist;+2 safety/equipment Gait Distance (Feet): 150 Feet Assistive device: Rolling  walker (2 wheeled) Gait Pattern/deviations: Step-through pattern;Decreased stride length;Decreased dorsiflexion - left Gait velocity: slow Gait velocity interpretation: <1.8 ft/sec, indicate of risk for recurrent falls General Gait Details: max directional verbal cues, pt with R sided gaze preferance requiring max directional verbal cues to turn to the left and look out for obstacles on the L. modA for walker management, modA for forward progression   Stairs             Wheelchair Mobility    Modified Rankin (Stroke Patients Only) Modified Rankin (Stroke Patients Only) Pre-Morbid Rankin Score: No symptoms Modified Rankin: Moderately severe disability     Balance Overall balance assessment: Needs assistance Sitting-balance support: No upper extremity supported;Feet supported Sitting balance-Leahy Scale: Fair Sitting balance - Comments: static sitting with min assist for safety   Standing balance support: Bilateral upper extremity supported;During functional activity Standing balance-Leahy Scale: Poor Standing balance comment: reliant on B UE and external support                            Cognition Arousal/Alertness: Awake/alert Behavior During Therapy: Flat affect Overall Cognitive Status: Impaired/Different from baseline Area of Impairment: Orientation;Attention;Memory;Following commands;Safety/judgement;Awareness;Problem solving                 Orientation Level: (pt oriented to hospital and asked when she had the stroke) Current Attention Level: Sustained Memory: Decreased short-term memory Following Commands: Follows one step commands with increased time Safety/Judgement: Decreased awareness of safety;Decreased awareness of deficits Awareness: Intellectual Problem Solving: Slow processing;Decreased initiation;Difficulty sequencing;Requires verbal cues;Requires tactile cues General Comments: inconsistently  following commands       Exercises       General Comments General comments (skin integrity, edema, etc.): VSS      Pertinent Vitals/Pain Pain Assessment: No/denies pain    Home Living                      Prior Function            PT Goals (current goals can now be found in the care plan section) Acute Rehab PT Goals Patient Stated Goal: none stated Progress towards PT goals: Progressing toward goals    Frequency    Min 4X/week      PT Plan Current plan remains appropriate    Co-evaluation              AM-PAC PT "6 Clicks" Mobility   Outcome Measure                   End of Session Equipment Utilized During Treatment: Gait belt Activity Tolerance: Patient tolerated treatment well Patient left: in chair;with call bell/phone within reach;with chair alarm set Nurse Communication: Mobility status PT Visit Diagnosis: Unsteadiness on feet (R26.81);Muscle weakness (generalized) (M62.81);Difficulty in walking, not elsewhere classified (R26.2)     Time: 4628-6381 PT Time Calculation (min) (ACUTE ONLY): 24 min  Charges:  $Gait Training: 23-37 mins                     Kittie Plater, PT, DPT Acute Rehabilitation Services Pager #: [phone] Office #: [phone]    Berline Lopes 03/04/2018, 12:05 PM

## 2018-03-04 NOTE — Progress Notes (Signed)
Initial Nutrition Assessment  DOCUMENTATION CODES:   Obesity unspecified  INTERVENTION:    Ensure Enlive po BID, each supplement provides 350 kcal and 20 grams of protein  Provide MVI daily  NUTRITION DIAGNOSIS:   Inadequate oral intake related to nausea, vomiting as evidenced by per patient/family report.  GOAL:   Patient will meet greater than or equal to 90% of their needs  MONITOR:   PO intake, Supplement acceptance, Diet advancement, Labs, I & O's, Weight trends  REASON FOR ASSESSMENT:   Low Braden    ASSESSMENT:   Patient with PMH significant for CAD s/p CABG, HTN, HLD, TIAs, and DM. [facility], left-sided weakness, and ureteral obstruction s/p stent. Transferred to [facility] for multiple CVA infarcts and severe sepsis.    12/4- SLP advanced diet to DYS 3 with thin liquids  Spoke with husband at bedside as pt stated she was too nauseous to speak. Husband states pt's intake decreased slightly after she got laid off from her job a few months ago. States she typically eats toast with oatmeal for breakfast, apples for snacks, deli meat sandwich for lunch, and meat with vegetables for dinner. PTA she had no issues swallowing. RD observed lunch tray at bedside with meat loaf, vegetables, and potatoes untouched. Pt states the smell makes her even more nauseous and she does not want to eat. RD relayed info to RN, plan to provide pt with zofran. Discussed the importance of protein intake for preservation of lean body mass. Pt amendable to supplementation.   Pt endorses a UBW of 200 lb and a recent wt loss of 15 lb. Records indicate pt weighed 198 lb on 06/23/17 and 188 lb this admission (5% wt loss in 9 months, insignificant for time frame). Nutrition-Focused physical exam completed.   Medications reviewed.  Labs reviewed: K 3.3 (L)   NUTRITION - FOCUSED PHYSICAL EXAM:    Most Recent Value  Orbital Region  No depletion  Upper Arm Region  No depletion   Thoracic and Lumbar Region  Unable to assess  Buccal Region  No depletion  Temple Region  No depletion  Clavicle Bone Region  No depletion  Clavicle and Acromion Bone Region  No depletion  Scapular Bone Region  Unable to assess  Dorsal Hand  No depletion  Patellar Region  No depletion  Anterior Thigh Region  No depletion  Posterior Calf Region  No depletion  Edema (RD Assessment)  None  Hair  Reviewed  Eyes  Reviewed  Mouth  Unable to assess  Skin  Reviewed  Nails  Reviewed     Diet Order:   Diet Order            DIET DYS 3 Room service appropriate? Yes; Fluid consistency: Thin  Diet effective now              EDUCATION NEEDS:   Education needs have been addressed  Skin:  Skin Assessment: Skin Integrity Issues: Skin Integrity Issues:: Incisions Incisions: vagina  Last BM:  03/03/18  Height:   Ht Readings from Last 1 Encounters:  03/02/18 5\' 5"  (1.651 m)    Weight:   Wt Readings from Last 1 Encounters:  03/03/18 85.6 kg    Ideal Body Weight:  56.8 kg  BMI:  Body mass index is 31.4 kg/m.  Estimated Nutritional Needs:   Kcal:  1650-1850 kcal  Protein:  80-95 grams  Fluid:  >/= 1.6 L/day   Mariana Single RD, LDN Clinical Nutrition Pager # -  [phone]  

## 2018-03-04 NOTE — Progress Notes (Signed)
  Speech Language Pathology Treatment: Dysphagia;Cognitive-Linquistic  Patient Details Name: Theresa Ray MRN: [id number] DOB: [birth year] Today's Date: 03/04/2018 Time: 0840-0901 SLP Time Calculation (min) (ACUTE ONLY): 21 min  Assessment / Plan / Recommendation Clinical Impression  Pt was seen for skilled ST targeting goals for cognition and dysphagia.  RN reports that pt is doing much better today in comparison to yesterday's evaluation.  Upon arrival, pt was resting in bed but awakened easily to voice and light touch and immediately asked for ice chips.  Pt was noted with decreased response latency today and significantly improved task initiation.  Pt even spontaneously looked to her left multiple times throughout therapy session and was able to use her left hand functionally to hold containers during self feeding tasks.   Pt still needed overall mod cues to locate items well to the left of midline.   Pt consumed thin liquids via straw and cup sips, purees, and crackers with no overt s/s of aspiration.  Pt had slightly prolonged mastication of advanced solids due to fluctuating attention to boluses but was easily redirected to task with min-mod verbal cues.  Pt had complete clearance of residual solids from the oral cavity post swallow with increased time.  As a result, recommend initiating a diet of dys 3 textures and thin liquids with full supervision for use of swallowing precautions.  Pt was left in bed with call bell within reach.  Discussed diet recommendations with pt's RN.  Continue per current plan of care.    HPI HPI: Theresa Ray is a 72 year old lady with history of CAD and TIAs transferred from [facility] due to altered mental status and left-sided weakness was found to have ureteral obstruction status post stent.  MRI completed with findings of Multifocal acute bilateral small infarcts spanning multiple vascular territories most compatible with embolic phenomena and Moderate to severe  chronic small vessel ischemic changes. 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      SLP Plan  Continue with current plan of care       Recommendations  Diet recommendations: Dysphagia 3 (mechanical soft);Thin liquid Liquids provided via: Cup;Straw Medication Administration: Whole meds with liquid Supervision: Patient able to self feed;Full supervision/cueing for compensatory strategies Compensations: Minimize environmental distractions;Slow rate;Small sips/bites                Oral Care Recommendations: Oral care QID Follow up Recommendations: 24 hour supervision/assistance;Inpatient Rehab SLP Visit Diagnosis: Dysphagia, unspecified (R13.10);Cognitive communication deficit (R41.841) Plan: Continue with current plan of care       GO                Jaydee Conran, Selinda Orion 03/04/2018, 9:07 AM

## 2018-03-04 NOTE — Consult Note (Addendum)
Cardiology Consultation:   Patient ID: Theresa Ray MRN: [id number]; DOB: 1946-01-25  Admit date: 03/02/2018 Date of Consult: 03/04/2018  Primary Care Provider: Ronita Hipps, MD Primary Cardiologist: Lauree Chandler, MD  Primary Electrophysiologist:  None    Patient Profile:   Theresa Ray is a 72 y.o. female with a hx of chronic diastolic CHF, TIAs, DM, hypertension, CAD s/p CABG X3 01/2017, postop A. fib who is being seen today for the evaluation of abnormal echocardiogram in the setting of stroke at the request of Dr. Elwyn Reach.  History of Present Illness:   Theresa Ray presented to [facility] with altered mental status, left-sided weakness and severe abdominal pain and was found to have left ureteral obstruction requiring double-J stent.  She was transferred to the [facility], ICU for altered mental status.  Initial CT of the head was negative for acute intracranial hemorrhage however MRI demonstrated small multifocal infarcts concerning for embolic stroke.  She has been febrile and she is currently being treated for sepsis related to acute pyelonephritis secondary to left UPJ obstruction and double-J stent placement.   Today Theresa Ray is alert and oriented but a little slow to answer questions.  Her husband is present.  He notes that approximately 1 to 2 months ago during a car trip the patient had transient difficulty with speech and acting like she was in all their.  She refused to go to the hospital and her symptoms resolved.  He also notes that she has had frequent falls.  She does not lose consciousness but her legs suddenly get very weak and she goes to the ground.  The patient denies having any associated dizziness leading to her falls.   Theresa Ray denies any current or recent chest discomfort, shortness of breath, orthopnea, palpitations or peripheral edema.  She has not had any chills since being transferred to [facility].  No pain anywhere and no pain  related to her ureteral stent.  She has a Foley cath in place.  She is able to move all extremities and walked out in the hall yesterday.  She is on a dysphagia 3 diet but was unable to eat today due to the aroma of her food causing her loss of appetite.  Past Medical History:  Diagnosis Date  . CAD in native artery    a. s/p CABGx3 in 01/2017.  Marland Kitchen Chronic diastolic CHF (congestive heart failure) ([place])   . Diabetes mellitus ([place])   . Diabetic neuropathy ([place])   . History of kidney stones    history of stones  . Hypertension   . Hypothyroidism   . NSTEMI (non-ST elevated myocardial infarction) ([place])   . Orthostatic hypotension   . Postoperative atrial fibrillation ([place])    a. after CABG 01/2017.  Marland Kitchen TIA (transient ischemic attack)     Past Surgical History:  Procedure Laterality Date  . ABDOMINAL HYSTERECTOMY    . CORONARY ARTERY BYPASS GRAFT N/A 02/24/2017   Procedure: CORONARY ARTERY BYPASS GRAFTING (CABG) USING LEFT INTERNAL MAMMARY ARTERY TO LAD AND ENDOSCOPICALLY HARVESTED GREATER SAPHENOUS VEIN TO PDA AND TO OM1.;  Surgeon: Grace Isaac, MD;  Location: [place];  Service: Open Heart Surgery;  Laterality: N/A;  . ENDOVEIN HARVEST OF GREATER SAPHENOUS VEIN Right 02/24/2017   Procedure: ENDOVEIN HARVEST OF GREATER SAPHENOUS VEIN;  Surgeon: Grace Isaac, MD;  Location: [place];  Service: Open Heart Surgery;  Laterality: Right;  . LAPAROSCOPIC GASTRIC BANDING    .  LEFT HEART CATH AND CORONARY ANGIOGRAPHY N/A 02/18/2017   Procedure: LEFT HEART CATH AND CORONARY ANGIOGRAPHY;  Surgeon: Leonie Man, MD;  Location: [place] [facility];  Service: Cardiovascular;  Laterality: N/A;  . TEE WITHOUT CARDIOVERSION N/A 02/24/2017   Procedure: TRANSESOPHAGEAL ECHOCARDIOGRAM (TEE);  Surgeon: Grace Isaac, MD;  Location: [place];  Service: Open Heart Surgery;  Laterality: N/A;     Home Medications:  Prior to Admission medications   Medication Sig Start Date End Date Taking?  Authorizing Provider  acetaminophen (TYLENOL) 325 MG tablet Take 2 tablets (650 mg total) by mouth every 6 (six) hours as needed for mild pain. 03/03/17  Yes Barrett, Lodema Hong, PA-C  aspirin EC 81 MG tablet Take 1 tablet (81 mg total) by mouth daily. 03/31/17  Yes Dunn, Dayna N, PA-C  atorvastatin (LIPITOR) 40 MG tablet Take 1 tablet (40 mg total) by mouth daily. 06/23/17 03/03/18 Yes Burnell Blanks, MD  Carboxymethylcellul-Glycerin (CLEAR EYES FOR DRY EYES) 1-0.25 % SOLN Place 1 drop into both eyes as needed.   Yes [provider]  carvedilol (COREG) 12.5 MG tablet Take 1 tablet (12.5 mg total) by mouth 2 (two) times daily. 12/24/17  Yes Burnell Blanks, MD  cholecalciferol (VITAMIN D) 1000 units tablet Take 3,000 Units 2 (two) times daily by mouth.   Yes [provider]  clopidogrel (PLAVIX) 75 MG tablet Take 1 tablet (75 mg total) by mouth daily. 03/31/17  Yes Dunn, Dayna N, PA-C  co-enzyme Q-10 30 MG capsule Take 30 mg daily by mouth.   Yes [provider]  glipiZIDE (GLUCOTROL) 10 MG tablet Take 10 mg 2 (two) times daily by mouth. 12/16/16  Yes [provider]  Glucosamine-Chondroit-Vit C-Mn (GLUCOSAMINE CHONDR 500 COMPLEX PO) Take 1 tablet 2 (two) times daily by mouth.   Yes [provider]  Javier Docker Oil 1000 MG CAPS Take 1,000 mg daily by mouth.   Yes [provider]  levothyroxine (SYNTHROID, LEVOTHROID) 175 MCG tablet Take 175 mcg daily before breakfast by mouth. 11/19/16  Yes [provider]  lisinopril (PRINIVIL,ZESTRIL) 5 MG tablet Take 1 tablet (5 mg total) by mouth daily. 03/03/17  Yes Barrett, Erin R, PA-C  MELATONIN PO Take 10 mg at bedtime by mouth.   Yes [provider]  metFORMIN (GLUCOPHAGE) 1000 MG tablet Take 1,000 mg 2 (two) times daily by mouth. 12/16/16  Yes [provider]  Multiple Vitamin (MULTIVITAMIN WITH MINERALS) TABS tablet Take 1 tablet daily by mouth.   Yes [provider]    traMADol (ULTRAM) 50 MG tablet Take 50 mg by mouth 2 (two) times daily. For restless leg 02/23/18  Yes [provider]    Inpatient Medications: Scheduled Meds: . aspirin EC  81 mg Oral Daily  . atorvastatin  40 mg Oral Daily  . carvedilol  12.5 mg Oral BID WC  . chlorhexidine  15 mL Mouth Rinse BID  . citalopram  10 mg Oral Daily  . clopidogrel  75 mg Oral Daily  . insulin aspart  0-15 Units Subcutaneous Q4H  . [START ON 03/05/2018] levothyroxine  175 mcg Oral QAC breakfast  . lisinopril  5 mg Oral Daily  . mouth rinse  15 mL Mouth Rinse q12n4p  . pantoprazole  40 mg Oral Daily   Continuous Infusions: . sodium chloride Stopped (03/03/18 1712)  . cefTRIAXone (ROCEPHIN)  IV Stopped (03/03/18 2159)   PRN Meds: sodium chloride  Allergies:    Allergies  Allergen Reactions  .  Sulfa Antibiotics Nausea And Vomiting  . Shellfish Allergy Other (See Comments)    UNKNOWN REACTION.  Allergy found on ALLERGY TEST.  . Aggrenox [Aspirin-Dipyridamole Er] Other (See Comments)    Redness in eyes, takes aspirin 325 at home at baseline    Social History:   Social History   Socioeconomic History  . Marital status: Married    Spouse name: Not on file  . Number of children: Not on file  . Years of education: Not on file  . Highest education level: Not on file  Occupational History  . Not on file  Social Needs  . Financial resource strain: Not on file  . Food insecurity:    Worry: Not on file    Inability: Not on file  . Transportation needs:    Medical: Not on file    Non-medical: Not on file  Tobacco Use  . Smoking status: Former Smoker    Types: Cigarettes    Start date: 1967    Last attempt to quit: 1975    Years since quitting: 44.9  . Smokeless tobacco: Never Used  Substance and Sexual Activity  . Alcohol use: No    Frequency: Never  . Drug use: No  . Sexual activity: Not on file  Lifestyle  . Physical activity:    Days per week: Not on file    Minutes per  session: Not on file  . Stress: Not on file  Relationships  . Social connections:    Talks on phone: Not on file    Gets together: Not on file    Attends religious service: Not on file    Active member of club or organization: Not on file    Attends meetings of clubs or organizations: Not on file    Relationship status: Not on file  . Intimate partner violence:    Fear of current or ex partner: Not on file    Emotionally abused: Not on file    Physically abused: Not on file    Forced sexual activity: Not on file  Other Topics Concern  . Not on file  Social History Narrative  . Not on file    Family History:    Family History  Problem Relation Age of Onset  . Heart disease Mother 85       CABG  . CVA Father      ROS:  Please see the history of present illness.   All other ROS reviewed and negative.     Physical Exam/Data:   Vitals:   03/04/18 0900 03/04/18 1027 03/04/18 1100 03/04/18 1200  BP: (!) 181/81 (!) 152/76 (!) 145/75   Pulse: 84 71 64   Resp: 15 (!) 21 19   Temp:    98.5 F (36.9 C)  TempSrc:    Oral  SpO2: 94% 93% 95%   Weight:      Height:        Intake/Output Summary (Last 24 hours) at 03/04/2018 1328 Last data filed at 03/04/2018 1100 Gross per 24 hour  Intake 3575.86 ml  Output 1750 ml  Net 1825.86 ml   Filed Weights   03/02/18 2135 03/03/18 0500  Weight: 85.5 kg 85.6 kg   Body mass index is 31.4 kg/m.  General:  Well nourished, well developed, in no acute distress HEENT: normal Neck: no JVD Endocrine:  No thryomegaly Vascular: No carotid bruits; FA pulses 2+ bilaterally without bruits  Cardiac:  normal S1, S2; RRR; no murmur  Lungs:  clear to auscultation bilaterally, no wheezing, rhonchi or rales  Abd: soft, nontender, no hepatomegaly  Ext: Very trace puffy edema of ankles Musculoskeletal:  No deformities, BUE and BLE strength normal and equal Skin: warm and dry  Neuro:  CNs 2-12 intact, no focal abnormalities noted Psych:  Normal  affect   EKG:  The EKG was personally reviewed and demonstrates: Normal sinus rhythm with poor R wave progression, old inferior infarct Telemetry:  Telemetry was personally reviewed and demonstrates: Sinus rhythm in the 80s with occasional PVCs  Relevant CV Studies:  Echocardiogram 03/03/2018 Study Conclusions - Procedure narrative: Transthoracic echocardiography. Image   quality was adequate. The study was technically difficult. - Left ventricle: The cavity size was normal. Wall thickness was   increased in a pattern of mild LVH. Systolic function was normal.   The estimated ejection fraction was in the range of 50% to 55%.   There is hypokinesis of the inferolateral and inferior   myocardium. The study is not technically sufficient to allow   evaluation of LV diastolic function. - Mitral valve: There was mild to moderate regurgitation. - Left atrium: The atrium was mildly dilated.  Impressions: - Hypokinesis of the inferior/inferolateral walls with overall low   normal LV systolic function; mild LVH; mild to moderate MR; mild   LAE.   Laboratory Data:  Chemistry Recent Labs  Lab 03/03/18 0154 03/03/18 1154 03/04/18 0725  NA 134* 135 139  K 3.1* 3.1* 3.3*  CL 96* 101 105  CO2 21* 22 20*  GLUCOSE 210* 190* 144*  BUN 23 24* 21  CREATININE 1.79* 1.56* 1.37*  CALCIUM 8.4* 8.1* 8.2*  GFRNONAA 28* 34* 39*  GFRAA 33* 39* 45*  ANIONGAP 17* 12 14    Recent Labs  Lab 03/02/18 2302  PROT 6.9  ALBUMIN 3.3*  AST 32  ALT 19  ALKPHOS 41  BILITOT 0.9   Hematology Recent Labs  Lab 03/02/18 2302 03/03/18 0154 03/04/18 0725  WBC 22.6* 19.3* 16.3*  RBC 4.57 4.34 3.72*  HGB 13.9 13.2 11.6*  HCT 41.5 39.2 34.0*  MCV 90.8 90.3 91.4  MCH 30.4 30.4 31.2  MCHC 33.5 33.7 34.1  RDW 12.2 12.3 12.6  PLT 352 327 261   Cardiac EnzymesNo results for input(s): TROPONINI in the last 168 hours. No results for input(s): TROPIPOC in the last 168 hours.  BNPNo results for  input(s): BNP, PROBNP in the last 168 hours.  DDimer No results for input(s): DDIMER in the last 168 hours.  Radiology/Studies:  Ct Head Wo Contrast  Result Date: 03/02/2018 CLINICAL DATA:  72 year old female with altered mental status. EXAM: CT HEAD WITHOUT CONTRAST TECHNIQUE: Contiguous axial images were obtained from the base of the skull through the vertex without intravenous contrast. COMPARISON:  Head CT dated 03/02/2018 FINDINGS: Brain: There is mild age-related atrophy. Moderate to advanced periventricular and deep white matter hypodensities most consistent with chronic microvascular ischemic changes and similar to prior CT. Left basal ganglia old lacunar infarct. There is no acute intracranial hemorrhage. No mass effect or midline shift. No extra-axial fluid collection. Vascular: No hyperdense vessel or unexpected calcification. Skull: Normal. Negative for fracture or focal lesion. Sinuses/Orbits: Mild mucoperiosteal thickening of paranasal sinuses. No air-fluid levels. The mastoid air cells are clear. Other: None IMPRESSION: 1. No acute intracranial hemorrhage. 2. Age-related atrophy and chronic microvascular ischemic changes. Electronically Signed   By: Anner Crete M.D.   On: 03/02/2018 23:09   Mr Brain Wo Contrast  Result Date: 03/03/2018  CLINICAL DATA:  Altered mental status.  LEFT-sided weakness. EXAM: MRI HEAD WITHOUT CONTRAST TECHNIQUE: Multiplanar, multiecho pulse sequences of the brain and surrounding structures were obtained without intravenous contrast. COMPARISON:  CT HEAD March 02, 2018 and MRI head May 14, 2012. FINDINGS: INTRACRANIAL CONTENTS: Subcentimeter foci reduced diffusion LEFT frontal, RIGHT parietal and bilateral temporal lobes with low ADC values. Small areas of confluent reduced diffusion RIGHT parietal and occipital lobes with corresponding low ADC values. Susceptibility artifact associated with bilateral old basal ganglia infarcts. Old small RIGHT inferior  cerebellar infarct. No susceptibility artifact to suggest interval hemorrhage. Confluent supratentorial white matter FLAIR T2 hyperintensities. Mild ex vacuo dilatation bilateral lateral ventricles. No hydrocephalus. No abnormal extra-axial fluid collections. VASCULAR: Normal major intracranial vascular flow voids present at skull base. SKULL AND UPPER CERVICAL SPINE: No abnormal sellar expansion. No suspicious calvarial bone marrow signal. Craniocervical junction maintained. SINUSES/ORBITS: The mastoid air-cells and included paranasal sinuses are well-aerated.The included ocular globes and orbital contents are non-suspicious. OTHER: None. IMPRESSION: 1. Multifocal acute bilateral small infarcts spanning multiple vascular territories most compatible with embolic phenomena. 2. Moderate to severe chronic small vessel ischemic changes. 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 Critical Value/emergent results text paged to [place] via AMION secure system on 03/03/2018 at 5:57 am, including interpreting physician's phone number. Electronically Signed   By: Elon Alas M.D.   On: 03/03/2018 05:58    Assessment and Plan:   CVA -MRI showed multifocal infarcts, per neurology-source unclear, concerning for endocarditis vs A. Fib -Patient has been febrile with leukocytosis.  Sepsis related to pyelonephritis.  Blood cultures negative to date. -TTE showed mild reduction in EF to 50-55% and hypokinesis of the inferolateral and inferior myocardium, mild-moderate MR and mild LAE.  -Neurology has requested a TEE to further evaluate valves/possible endocarditis. We will arrange TEE for Friday (no availability tomorrow). She had TEE last year with no problems. Discussed with the pt and her husband and they agree.  -Neurology is allowing for permissive hypertension -Currently maintaining sinus rhythm, will have loop recorder placed on Friday to assess for occult A. Fib, unless afib noted on tele before  then. -Aspirin, Plavix and statin have been resumed today -Per husband, pt had a transient episode of difficulty speaking and confusion about a month or so ago while on a car trip.  The patient refused to seek medical care for this at the time.  Chronic diastolic CHF -No signs of volume overload -Echo showed very mildly reduced EF to 50-55%, the study could not estimate diastolic function.   Hypertension -Neurology is allowing for some permissive hypertension -Patient now taking oral medications and her home lisinopril and carvedilol were resumed today.  CAD -S/P CABG 01/2017 (LIMA to LAD, SVG to PDA, SVG to OM1) -The patient has been managed with aspirin, Plavix, statin and beta-blocker.    Postop atrial fibrillation after bypass surgery in 01/2017 -Patient was briefly on amiodarone but stopped postoperatively with maintenance of sinus rhythm.  No known recurrences.  30-day event monitor 12/31/17-01/29/18 showed no atrial fibrillation. (pt states that the monitor was causing her pain and she did not  -No A. fib noted on telemetry so far. -Given her current stroke, plan for loop recorder to assess for occult A. Fib -If afib noted, would need to address anticoagulation.   Sepsis -Acute pyelonephritis secondary to UPJ obstruction status post double-J stent placement.  Fever and leukocytosis. -Management per critical care team, on antibiotics -Blood cultures negative to date  Acute renal  insufficiency -Serum creatinine up to 1.87, improving, has trended down to 1.37 today  Diabetes type 2  -A1c was 6 on 03/03/2018.  Adequately controlled.  Hyperlipidemia -LDL on 03/03/2018 was 99, patient was likely off her statin for a few days.  LDL was 23 in 01/2017. -Home atorvastatin 40 mg has been resumed as of today.  Recheck lipid panel in 3 to 4 weeks and adjust statin as needed for LDL goal <70.   For questions or updates, please contact [place] Please consult [email or web address] for  contact info under    Signed, Daune Perch, NP  03/04/2018 1:28 PM ---------------------------------------------------------------------------------------------   History and all data above reviewed.  Patient examined.  I agree with the findings as above.  Theresa Ray is a pleasant 72 yo female who is noted to have strokes, with a history of afib after CABG who we are asked to see for cardiac source of stroke.   Constitutional: No acute distress Eyes: pupils equally round and reactive to light, sclera non-icteric, normal conjunctiva and lids ENMT: normal dentition, moist mucous membranes Cardiovascular: regular rhythm, normal rate, no murmurs. S1 and S2 normal. Radial pulses normal bilaterally. No jugular venous distention.  Respiratory: clear to auscultation bilaterally GI : normal bowel sounds, soft and nontender. No distention.   MSK: extremities warm, well perfused. No edema.  NEURO: grossly nonfocal exam, moves all extremities. PSYCH: alert and oriented x 3, normal mood and affect.   All available labs, radiology testing, previous records reviewed. Agree with documented assessment and plan of my colleague as stated above with the following additions or changes:  Active Problems:   Severe sepsis (HCC)   Acute CVA (cerebrovascular accident) ([place])   Pyelonephritis   Chronic diastolic congestive heart failure ([place])   Diabetes mellitus type 2 in obese (HCC)   Orthostasis   Tachypnea   Hypokalemia   Plan:we will perform a TEE on Friday (first available) and will implant a loop recorder on Friday as well given that her 30 day monitor did not show atrial fibrillation. She and her husband are in agreement with the plan.  Length of Stay:  LOS: 2 days   Elouise Munroe, MD [facility] 7:15 PM  03/04/2018

## 2018-03-05 ENCOUNTER — Inpatient Hospital Stay (HOSPITAL_COMMUNITY): Payer: Medicare HMO

## 2018-03-05 DIAGNOSIS — I10 Essential (primary) hypertension: Secondary | ICD-10-CM

## 2018-03-05 DIAGNOSIS — E785 Hyperlipidemia, unspecified: Secondary | ICD-10-CM

## 2018-03-05 LAB — CBC
HCT: 32.9 % — ABNORMAL LOW (ref 36.0–46.0)
Hemoglobin: 11 g/dL — ABNORMAL LOW (ref 12.0–15.0)
MCH: 30.4 pg (ref 26.0–34.0)
MCHC: 33.4 g/dL (ref 30.0–36.0)
MCV: 90.9 fL (ref 80.0–100.0)
Platelets: 325 10*3/uL (ref 150–400)
RBC: 3.62 MIL/uL — ABNORMAL LOW (ref 3.87–5.11)
RDW: 12.3 % (ref 11.5–15.5)
WBC: 16 10*3/uL — ABNORMAL HIGH (ref 4.0–10.5)
nRBC: 0 % (ref 0.0–0.2)

## 2018-03-05 LAB — BASIC METABOLIC PANEL
Anion gap: 14 (ref 5–15)
BUN: 24 mg/dL — ABNORMAL HIGH (ref 8–23)
CO2: 20 mmol/L — ABNORMAL LOW (ref 22–32)
Calcium: 8.2 mg/dL — ABNORMAL LOW (ref 8.9–10.3)
Chloride: 100 mmol/L (ref 98–111)
Creatinine, Ser: 1.44 mg/dL — ABNORMAL HIGH (ref 0.44–1.00)
GFR calc Af Amer: 43 mL/min — ABNORMAL LOW (ref >60)
GFR calc non Af Amer: 37 mL/min — ABNORMAL LOW (ref >60)
Glucose, Bld: 152 mg/dL — ABNORMAL HIGH (ref 70–99)
Potassium: 3.1 mmol/L — ABNORMAL LOW (ref 3.5–5.1)
Sodium: 134 mmol/L — ABNORMAL LOW (ref 135–145)

## 2018-03-05 LAB — GLUCOSE, CAPILLARY
Glucose-Capillary: 141 mg/dL — ABNORMAL HIGH (ref 70–99)
Glucose-Capillary: 145 mg/dL — ABNORMAL HIGH (ref 70–99)
Glucose-Capillary: 133 mg/dL — ABNORMAL HIGH (ref 70–99)
Glucose-Capillary: 160 mg/dL — ABNORMAL HIGH (ref 70–99)
Glucose-Capillary: 167 mg/dL — ABNORMAL HIGH (ref 70–99)
Glucose-Capillary: 157 mg/dL — ABNORMAL HIGH (ref 70–99)

## 2018-03-05 MED ORDER — BOOST PLUS PO LIQD
237.0000 mL | Freq: Three times a day (TID) | ORAL | Status: DC
  Administered 2018-03-05 – 2018-03-09 (×10): 237 mL via ORAL
  Filled 2018-03-05 (×14): qty 237

## 2018-03-05 MED ORDER — POTASSIUM CHLORIDE CRYS ER 20 MEQ PO TBCR
40.0000 meq | EXTENDED_RELEASE_TABLET | Freq: Every day | ORAL | Status: DC
  Administered 2018-03-05 – 2018-03-09 (×5): 40 meq via ORAL
  Filled 2018-03-05 (×5): qty 2

## 2018-03-05 MED ORDER — POTASSIUM CHLORIDE CRYS ER 20 MEQ PO TBCR
40.0000 meq | EXTENDED_RELEASE_TABLET | Freq: Once | ORAL | Status: AC
  Administered 2018-03-05: 40 meq via ORAL
  Filled 2018-03-05: qty 2

## 2018-03-05 MED ORDER — SODIUM CHLORIDE 0.9 % IV SOLN
INTRAVENOUS | Status: DC
  Administered 2018-03-06: 06:00:00 via INTRAVENOUS

## 2018-03-05 MED ORDER — FUROSEMIDE 10 MG/ML IJ SOLN
60.0000 mg | Freq: Once | INTRAMUSCULAR | Status: AC
  Administered 2018-03-05: 60 mg via INTRAVENOUS
  Filled 2018-03-05: qty 8

## 2018-03-05 MED ORDER — AMLODIPINE BESYLATE 10 MG PO TABS
10.0000 mg | ORAL_TABLET | Freq: Every day | ORAL | Status: DC
  Administered 2018-03-05 – 2018-03-09 (×5): 10 mg via ORAL
  Filled 2018-03-05: qty 2
  Filled 2018-03-05 (×4): qty 1

## 2018-03-05 NOTE — Care Management Important Message (Signed)
Important Message  Patient Details  Name: Theresa Ray MRN: [id number] Date of Birth: May 22, 1945   Medicare Important Message Given:  Yes    Orbie Pyo 03/05/2018, 2:41 PM

## 2018-03-05 NOTE — Progress Notes (Signed)
.  Inpatient Rehabilitation-Admissions Coordinator    Met with patient and her husband at the bedside to discuss team's recommendation for inpatient rehabilitation. Shared booklets, expectations while in CIR, expected length of stay, and anticipated functional level at DC. Family interested in pursuing CIR. AC will begin insurance benefits check and begin insurance authorization process for possible admit.    Will continue to follow for medical readiness and insurance determination.   Jhonnie Garner, OTR/L  Rehab Admissions Coordinator  [phone] 03/05/2018 11:09 AM

## 2018-03-05 NOTE — H&P (View-Only) (Signed)
PROGRESS NOTE    Theresa Ray  DTO:[id number] DOB: 08-01-1945 DOA: 03/02/2018 PCP: Ronita Hipps, MD   Brief Narrative: Patient is a 72 year old female with past medical history of chronic diastolic CHF, TIAs, diabetes, hypertension, CAD status post CABG x3, postoperative A. fib who was transferred from [facility].  She initially presented to [facility] with altered mental status, left-sided weakness, severe abdominal pain.  Further work-up revealed left ureteral obstruction due to calculus.  Urology was consulted and she underwent double-J stent placement there.  She was transferred to here under ICU service for altered mental status.  MRI showed small multifocal infarcts concerning for embolic stroke.  Neurology, cardiology following.  Currently she is being treated for urosepsis secondary to pyelonephritis secondary to left UPJ obstruction.  Plan for TEE tomorrow. Patient transferred to hospitalist service today.  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  Continue ceftriaxone for now.  Blood cultures negative so far.  I did not see any urine culture sent.  She had T-max of 100.4 this morning.Will send urine culture.  Currently she is hemodynamically stable.  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  She was found to have 6 mm left upper ureteral stone with hydronephrosis and underwent stent placement.  Acute CVA: MRI showed multifocal infarcts.  Suggestive of embolic process.  Ruling out endocarditis versus atrial fibrillation.  TTE showed mild reduction in EF to 50 to 55%, hypokinesis of the inferior lateral and inferior myocardium, mild to moderate MR.  TEE planned  for tomorrow.  Neurology following.  She might need loop recorder placement to assess for occult A. fib.  Currently she is on aspirin, Plavix and statin. Her speech is slow.  She has severe weakness on the left side. EEG did not show any seizure activity. Imagings:MRI: Acute right MCA and punctate left MCA infarcts, chronic bilateral BG infarcts. CT head and neck: no LVO, moderate stenosis right V4, left A2, bilateral M2/M3 and left P2  Occult Afib?:She also has history of postop A. fib after bypass surgery November 2018.  She was also on amiodarone for a while.  30-day event monitoring on October 2019 did not show any atrial fibrillation.  No A. fib noted on telemetry so far. Plan for loop recorder placement tomorrow.  She follows with Dr. Angelena Form as outpatient  History of chronic diastolic CHF: Echocardiogram done here showed mildy  reduced ejection fraction.  She has bilateral basilar crackles.  Will check chest x-ray.  Hypertension: Systolic hypertension.  Started on amlodipine.  Also on lisinopril and carvedilol.  History of coronary disease: Status post CABG in November 2018.  Continue aspirin, Plavix, statin and beta-blocker.  AKI: Creatinine around 1.4 today.  We will continue to monitor BMP.  Diabetes type 2: Hemoglobin A1c of 6.  Continue sliding-scale insulin  Hyperlipidemia: LDL of 99.  Continue statin 40 mg daily.  Hypokalemia: Supplemented with potassium.  Deconditioning/debility/disposition: CIR following.  Most likely she will be discharged to CIR after full work-up.     Nutrition Problem: Inadequate oral intake Etiology: nausea, vomiting      DVT prophylaxis: SCD Code Status: Full Family Communication: Husband present at the bedside Disposition Plan:  CIR after full work-up   Consultants: Cardiology, neurology, PCCM  Procedures: MRI  Antimicrobials: Ceftriaxone, day 4  Subjective:  Patient seen and examined the bedside this morning.  Remains weak ,  lying in bed.  Her speech is slow.  Denies any chest pain, shortness of breath, nausea or vomiting.   Objective: Vitals:   03/04/18 2050 03/05/18 0040 03/05/18 0411 03/05/18 0800  BP: (!) 184/81 (!) 166/87 (!) 171/80 (!) 193/86  Pulse:  87 85 (!) 102  Resp: 20  (!) 32 (!) 32  Temp: 98.6 F (37 C) (!) 100.4 F (38 C) 98.2 F (36.8 C)   TempSrc: Oral Oral Tympanic   SpO2: 96% (!) 89% 90% 93%  Weight:      Height:        Intake/Output Summary (Last 24 hours) at 03/05/2018 1125 Last data filed at 03/05/2018 0600 Gross per 24 hour  Intake 790 ml  Output 775 ml  Net 15 ml   Filed Weights   03/02/18 2135 03/03/18 0500  Weight: 85.5 kg 85.6 kg    Examination:  General exam: Not in distress,obese HEENT:PERRL,Oral mucosa moist, Ear/Nose normal on gross exam Respiratory system: Basilar crackles Cardiovascular system: S1 & S2 heard, RRR. No JVD, murmurs, rubs, gallops or clicks. No pedal edema. Gastrointestinal system: Abdomen is nondistended, soft and nontender. No organomegaly or masses felt. Normal bowel sounds heard. Central nervous system: Alert and oriented. Power 4/5 on the right upper and lower extremity, 3 /5 on left upper and lower extremities.   Extremities: No edema, no clubbing ,no cyanosis, distal peripheral pulses palpable. Skin: No rashes, lesions or ulcers,no icterus ,no pallor GU: Foley.     Data Reviewed: I have personally reviewed following labs and imaging studies  CBC: Recent Labs  Lab 03/02/18 2302 03/03/18 0154 03/04/18 0725 03/05/18 0403  WBC 22.6* 19.3* 16.3* 16.0*  NEUTROABS 20.1*  --   --   --   HGB 13.9 13.2 11.6* 11.0*  HCT 41.5 39.2 34.0* 32.9*  MCV 90.8 90.3 91.4 90.9  PLT 352 327 261 989   Basic Metabolic Panel: Recent Labs  Lab 03/02/18 2302 03/03/18 0154 03/03/18 1154 03/04/18 0725 03/05/18 0403  NA 136 134* 135 139 134*  K 2.9* 3.1* 3.1* 3.3* 3.1*  CL 96* 96* 101 105 100  CO2 22 21* 22 20* 20*  GLUCOSE 224* 210* 190* 144*  152*  BUN 22 23 24* 21 24*  CREATININE 1.87* 1.79* 1.56* 1.37* 1.44*  CALCIUM 8.6* 8.4* 8.1* 8.2* 8.2*   GFR: Estimated Creatinine Clearance: 39.8 mL/min (A) (by C-G formula based on SCr of 1.44 mg/dL (H)). Liver Function Tests: Recent Labs  Lab 03/02/18 2302  AST 32  ALT 19  ALKPHOS 41  BILITOT 0.9  PROT 6.9  ALBUMIN 3.3*   No results for input(s): LIPASE, AMYLASE in the last 168 hours. No results for input(s): AMMONIA in the last 168 hours. Coagulation Profile: No results for input(s): INR, PROTIME in the last 168 hours. Cardiac Enzymes: No results for input(s): CKTOTAL, CKMB, CKMBINDEX, TROPONINI in the last 168 hours. BNP (last 3 results) No results for input(s): PROBNP in the last 8760 hours. HbA1C: Recent Labs    03/03/18 1046  HGBA1C 6.0*   CBG: Recent Labs  Lab 03/04/18 1537 03/04/18 2022 03/05/18 0102 03/05/18 0409 03/05/18 0806  GLUCAP 188* 187* 141* 145* 133*   Lipid Profile: Recent Labs    03/03/18 1046  CHOL 167  HDL 37*  LDLCALC 99  TRIG  157*  CHOLHDL 4.5   Thyroid Function Tests: No results for input(s): TSH, T4TOTAL, FREET4, T3FREE, THYROIDAB in the last 72 hours. Anemia Panel: No results for input(s): VITAMINB12, FOLATE, FERRITIN, TIBC, IRON, RETICCTPCT in the last 72 hours. Sepsis Labs: Recent Labs  Lab 03/03/18 0154 03/03/18 1154 03/03/18 1347 03/04/18 0725  LATICACIDVEN 4.2* 1.8 2.0* 1.1    Recent Results (from the past 240 hour(s))  MRSA PCR Screening     Status: None   Collection Time: 03/02/18  9:53 PM  Result Value Ref Range Status   MRSA by PCR NEGATIVE NEGATIVE Final    Comment:        The GeneXpert MRSA Assay (FDA approved for NASAL specimens only), is one component of a comprehensive MRSA colonization surveillance program. It is not intended to diagnose MRSA infection nor to guide or monitor treatment for MRSA infections. Performed at [facility], [place] [address] [place], [place] [address]   Culture,  blood (Routine X 2) w Reflex to ID Panel     Status: None (Preliminary result)   Collection Time: 03/03/18 10:46 AM  Result Value Ref Range Status   Specimen Description BLOOD LEFT HAND  Final   Special Requests   Final    BOTTLES DRAWN AEROBIC ONLY Blood Culture adequate volume   Culture   Final    NO GROWTH < 24 HOURS Performed at [facility], [place] [address] [place], [place] [address]    Report Status PENDING  Incomplete  Culture, blood (Routine X 2) w Reflex to ID Panel     Status: None (Preliminary result)   Collection Time: 03/03/18 10:46 AM  Result Value Ref Range Status   Specimen Description BLOOD RIGHT HAND  Final   Special Requests   Final    BOTTLES DRAWN AEROBIC ONLY Blood Culture adequate volume   Culture   Final    NO GROWTH < 24 HOURS Performed at [facility], [place] [address], [place], [place] [address]    Report Status PENDING  Incomplete         Radiology Studies: No results found.      Scheduled Meds: . amLODipine  10 mg Oral Daily  . aspirin EC  81 mg Oral Daily  . atorvastatin  40 mg Oral Daily  . carvedilol  12.5 mg Oral BID WC  . chlorhexidine  15 mL Mouth Rinse BID  . citalopram  10 mg Oral Daily  . clopidogrel  75 mg Oral Daily  . insulin aspart  0-15 Units Subcutaneous Q4H  . lactose free nutrition  237 mL Oral TID WC  . levothyroxine  175 mcg Oral QAC breakfast  . lisinopril  5 mg Oral Daily  . mouth rinse  15 mL Mouth Rinse q12n4p  . multivitamin with minerals  1 tablet Oral Daily  . pantoprazole  40 mg Oral Daily  . potassium chloride  40 mEq Oral Once   Continuous Infusions: . sodium chloride Stopped (03/03/18 1712)  . cefTRIAXone (ROCEPHIN)  IV 1 g (03/04/18 2300)     LOS: 3 days    Time spent: More than 50% of that time was spent in counseling and/or coordination of care.      Shelly Coss, MD [facility] Pager [phone]  If 7PM-7AM, please contact night-coverage [email or web address] Password  [email or web address] 03/05/2018, 11:25 AM

## 2018-03-05 NOTE — Progress Notes (Signed)
Noted pt was tachypnic with O2 sat in 88-89 % during the night. o2 1l/min via [place] given 5:25 AM pt is saturating at 9-94 %.  T max 100.4 at [facility] now is 98.2 F . Respiration 22-27 b/min , will continue to monitor

## 2018-03-05 NOTE — Progress Notes (Signed)
Physical Therapy Treatment Patient Details Name: Theresa Ray MRN: [id number] DOB: 06-04-1945 Today's Date: 03/05/2018    History of Present Illness Pt is a 72 year old lady with history of CAD and TIAs transferred from [facility] due to altered mental status and left-sided weakness was found to have ureteral obstruction status post stent. MRI revealed multifocal acute bilat small infaccts t/p brain compatible with embolic phenomena. 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    PT Comments    Pt requires overall max A for bed mobility, and max A x2 with a RW for transfers. Pt demonstrates continued L side neglect, and is unable to orient to midline throughout the session. Pt attempts to take steps in standing, but is unable to complete the weight shift necessary. Pt begins session with 1.5 L of O2 with O2 saturation at 93%. O2 was removed during the session, and it stayed level throughout. After completing a third sit to stand, pt O2 saturation dropped to 88%. 1.5 L of O2 was then placed back on. Pt would continued to benefit from acute PT in order to increase strength, balance, and functional mobility.   BP in supine- 159/80 at beginning of session.   Follow Up Recommendations  CIR     Equipment Recommendations       Recommendations for Other Services       Precautions / Restrictions Precautions Precautions: Fall Precaution Comments: L sided neglect Restrictions Weight Bearing Restrictions: No    Mobility  Bed Mobility Overal bed mobility: Needs Assistance Bed Mobility: Supine to Sit;Sit to Supine     Supine to sit: Max assist Sit to supine: Max assist;+2 for physical assistance   General bed mobility comments: Pt requires multimodal cueing throughout to begin sequencing. Pt requires support for BLE and trunk elevation. Pt continually grabs onto R handrail, impeding the supine to sit. 2 person max A required for sit to supine for trunk and BLE support.   Transfers Overall  transfer level: Needs assistance Equipment used: Rolling walker (2 wheeled) Transfers: Sit to/from Stand Sit to Stand: Max assist;+2 physical assistance         General transfer comment: Pt able to complete sit to stand 1 times with max A and the use of RW. Pt needed assistance with power initiation and stability throughout. Multimodal cueing needed to achieve complete standing in the upright position. Pt then completed 2 additional sit to stands with max A x2 with a RW.  Ambulation/Gait                 Stairs             Wheelchair Mobility    Modified Rankin (Stroke Patients Only) Modified Rankin (Stroke Patients Only) Pre-Morbid Rankin Score: No symptoms Modified Rankin: Severe disability     Balance Overall balance assessment: Needs assistance Sitting-balance support: Single extremity supported;Feet supported Sitting balance-Leahy Scale: Poor Sitting balance - Comments: Pt required min-mod assistance throughout to maintain sitting balance.    Standing balance support: Bilateral upper extremity supported;During functional activity Standing balance-Leahy Scale: Zero Standing balance comment: Pt relied on continued UE support and max A throughout.                             Cognition Arousal/Alertness: Awake/alert Behavior During Therapy: Flat affect Overall Cognitive Status: Impaired/Different from baseline Area of Impairment: Orientation;Attention;Memory;Following commands;Safety/judgement;Awareness;Problem solving  Orientation Level: Disoriented to;Place;Situation Current Attention Level: Sustained Memory: Decreased short-term memory Following Commands: Follows one step commands with increased time;Follows one step commands consistently Safety/Judgement: Decreased awareness of safety;Decreased awareness of deficits Awareness: Intellectual Problem Solving: Slow processing;Decreased initiation;Difficulty  sequencing;Requires tactile cues;Requires verbal cues General Comments: multimodal cueing needed throughout. Pt demonstrated decreased awareness to L side, including visual field      Exercises      General Comments General comments (skin integrity, edema, etc.): BP in supine was 159/80      Pertinent Vitals/Pain Pain Assessment: No/denies pain    Home Living                      Prior Function            PT Goals (current goals can now be found in the care plan section) Acute Rehab PT Goals Patient Stated Goal: to go home PT Goal Formulation: With patient Time For Goal Achievement: 03/17/18 Potential to Achieve Goals: Good Progress towards PT goals: Progressing toward goals    Frequency    Min 4X/week      PT Plan Current plan remains appropriate    Co-evaluation              AM-PAC PT "6 Clicks" Mobility   Outcome Measure  Help needed turning from your back to your side while in a flat bed without using bedrails?: Total Help needed moving from lying on your back to sitting on the side of a flat bed without using bedrails?: Total Help needed moving to and from a bed to a chair (including a wheelchair)?: A Lot Help needed standing up from a chair using your arms (e.g., wheelchair or bedside chair)?: A Lot Help needed to walk in hospital room?: Total Help needed climbing 3-5 steps with a railing? : Total 6 Click Score: 8    End of Session Equipment Utilized During Treatment: Gait belt Activity Tolerance: Patient tolerated treatment well Patient left: in bed;with call bell/phone within reach;with bed alarm set Nurse Communication: Mobility status;Other (comment)(SCD's not working ) PT Visit Diagnosis: Unsteadiness on feet (R26.81);Muscle weakness (generalized) (M62.81);Difficulty in walking, not elsewhere classified (R26.2)     Time: 9326-7124 PT Time Calculation (min) (ACUTE ONLY): 36 min  Charges:  $Therapeutic Activity: 23-37 mins                      Theresa Ray, SPT Acute Rehab [phone] (pager) [phone] (office)    Theresa Ray 03/05/2018, 4:17 PM

## 2018-03-05 NOTE — Progress Notes (Addendum)
STROKE TEAM PROGRESS NOTE   SUBJECTIVE (INTERVAL HISTORY) Her RN and husband is at the bedside. Pt lying in bed, no distress,sleeping, but awakens and orients slowly.  leukocytosis and temp improved BP still high, ok to now resume home BP meds. TEE planned for tomorrow to r/o vegetation and septic emboli as etiology of stroke.   OBJECTIVE Temp:  [97.7 F (36.5 C)-100.4 F (38 C)] 98.2 F (36.8 C) (12/05 0411) Pulse Rate:  [67-102] 102 (12/05 0800) Cardiac Rhythm: Normal sinus rhythm (12/05 0700) Resp:  [16-32] 32 (12/05 0800) BP: (135-193)/(68-111) 193/86 (12/05 0800) SpO2:  [89 %-97 %] 93 % (12/05 0800)  Recent Labs  Lab 03/04/18 1537 03/04/18 2022 03/05/18 0102 03/05/18 0409 03/05/18 0806  GLUCAP 188* 187* 141* 145* 133*   Recent Labs  Lab 03/02/18 2302 03/03/18 0154 03/03/18 1154 03/04/18 0725 03/05/18 0403  NA 136 134* 135 139 134*  K 2.9* 3.1* 3.1* 3.3* 3.1*  CL 96* 96* 101 105 100  CO2 22 21* 22 20* 20*  GLUCOSE 224* 210* 190* 144* 152*  BUN 22 23 24* 21 24*  CREATININE 1.87* 1.79* 1.56* 1.37* 1.44*  CALCIUM 8.6* 8.4* 8.1* 8.2* 8.2*   Recent Labs  Lab 03/02/18 2302  AST 32  ALT 19  ALKPHOS 41  BILITOT 0.9  PROT 6.9  ALBUMIN 3.3*   Recent Labs  Lab 03/02/18 2302 03/03/18 0154 03/04/18 0725 03/05/18 0403  WBC 22.6* 19.3* 16.3* 16.0*  NEUTROABS 20.1*  --   --   --   HGB 13.9 13.2 11.6* 11.0*  HCT 41.5 39.2 34.0* 32.9*  MCV 90.8 90.3 91.4 90.9  PLT 352 327 261 325   No results for input(s): CKTOTAL, CKMB, CKMBINDEX, TROPONINI in the last 168 hours. No results for input(s): LABPROT, INR in the last 72 hours. No results for input(s): COLORURINE, LABSPEC, [place], GLUCOSEU, HGBUR, BILIRUBINUR, KETONESUR, PROTEINUR, UROBILINOGEN, NITRITE, LEUKOCYTESUR in the last 72 hours.  Invalid input(s): APPERANCEUR     Component Value Date/Time   CHOL 167 03/03/2018 1046   TRIG 157 (H) 03/03/2018 1046   HDL 37 (L) 03/03/2018 1046   CHOLHDL 4.5 03/03/2018  1046   VLDL 31 03/03/2018 1046   LDLCALC 99 03/03/2018 1046   Lab Results  Component Value Date   HGBA1C 6.0 (H) 03/03/2018   No results found for: LABOPIA, COCAINSCRNUR, LABBENZ, AMPHETMU, THCU, LABBARB  No results for input(s): ETH in the last 168 hours.  I have personally reviewed the radiological images below and agree with the radiology interpretations.  Ct Head Wo Contrast  Result Date: 03/02/2018 CLINICAL DATA:  72 year old female with altered mental status. EXAM: CT HEAD WITHOUT CONTRAST TECHNIQUE: Contiguous axial images were obtained from the base of the skull through the vertex without intravenous contrast. COMPARISON:  Head CT dated 03/02/2018 FINDINGS: Brain: There is mild age-related atrophy. Moderate to advanced periventricular and deep white matter hypodensities most consistent with chronic microvascular ischemic changes and similar to prior CT. Left basal ganglia old lacunar infarct. There is no acute intracranial hemorrhage. No mass effect or midline shift. No extra-axial fluid collection. Vascular: No hyperdense vessel or unexpected calcification. Skull: Normal. Negative for fracture or focal lesion. Sinuses/Orbits: Mild mucoperiosteal thickening of paranasal sinuses. No air-fluid levels. The mastoid air cells are clear. Other: None IMPRESSION: 1. No acute intracranial hemorrhage. 2. Age-related atrophy and chronic microvascular ischemic changes. Electronically Signed   By: Anner Crete M.D.   On: 03/02/2018 23:09   Mr Brain Wo Contrast  Result Date:  03/03/2018 CLINICAL DATA:  Altered mental status.  LEFT-sided weakness. EXAM: MRI HEAD WITHOUT CONTRAST TECHNIQUE: Multiplanar, multiecho pulse sequences of the brain and surrounding structures were obtained without intravenous contrast. COMPARISON:  CT HEAD March 02, 2018 and MRI head May 14, 2012. FINDINGS: INTRACRANIAL CONTENTS: Subcentimeter foci reduced diffusion LEFT frontal, RIGHT parietal and bilateral temporal  lobes with low ADC values. Small areas of confluent reduced diffusion RIGHT parietal and occipital lobes with corresponding low ADC values. Susceptibility artifact associated with bilateral old basal ganglia infarcts. Old small RIGHT inferior cerebellar infarct. No susceptibility artifact to suggest interval hemorrhage. Confluent supratentorial white matter FLAIR T2 hyperintensities. Mild ex vacuo dilatation bilateral lateral ventricles. No hydrocephalus. No abnormal extra-axial fluid collections. VASCULAR: Normal major intracranial vascular flow voids present at skull base. SKULL AND UPPER CERVICAL SPINE: No abnormal sellar expansion. No suspicious calvarial bone marrow signal. Craniocervical junction maintained. SINUSES/ORBITS: The mastoid air-cells and included paranasal sinuses are well-aerated.The included ocular globes and orbital contents are non-suspicious. OTHER: None. IMPRESSION: 1. Multifocal acute bilateral small infarcts spanning multiple vascular territories most compatible with embolic phenomena. 2. Moderate to severe chronic small vessel ischemic changes. 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 Critical Value/emergent results text paged to [place] via AMION secure system on 03/03/2018 at 5:57 am, including interpreting physician's phone number. Electronically Signed   By: Elon Alas M.D.   On: 03/03/2018 05:58     PHYSICAL EXAM  Temp:  [97.7 F (36.5 C)-100.4 F (38 C)] 98.2 F (36.8 C) (12/05 0411) Pulse Rate:  [67-102] 102 (12/05 0800) Resp:  [16-32] 32 (12/05 0800) BP: (135-193)/(68-111) 193/86 (12/05 0800) SpO2:  [89 %-97 %] 93 % (12/05 0800)  General - Well nourished, well developed, in no apparent distress.  Cardiovascular - Regular rate and rhythm.  Neuro - sleeping, slow to alert, but does orient and follow commands.Speech is clear and aming 2/2 and able to repeat.  No dysarthria.  PERRL, able to attend to both sides, but not cooperative on visual  field test with confrontation. Blinking to visual threat on the right, inconsistently blinking to visual threat on the left. Facial symmetrical, tongue midline. Bilateral upper extremity 4/5, BLE 3/5 proximal and 4/5 distal. Mild left UE and LE neglect.  DTR 1+, Babinski positive on the left.  Sensation symmetrical and coordination intact BUE.  Gait not tested.   ASSESSMENT/PLAN Ms. DURENE DODGE is a 72 y.o. female with history of hypertension, diabetes, hyperlipidemia, CHF, CAD/MI, postop A. fib, admitted for altered mental status and left-sided weakness. No tPA given due to outside window.    Stroke: Acute right MCA, MCA/PCA, MCA/ACA and left punctate MCA infarcts, cardioembolic pattern, source unclear, concerning for endocarditis versus undiagnosed A. fib  Resultant psychomotor slowing, high-grade fever  CT head no acute abnormality  MRI acute right MCA and punctate left MCA infarcts, chronic bilateral BG infarcts.  CT head and neck no LVO, moderate stenosis right V4, left A2, bilateral M2/M3 and left P2  2D Echo EF 50-55%  EEG no seizure, general slowing  TEE pending to rule out endocarditis; planned for tomorrow per cards  LDL 99  HgbA1c 6.0  Lovenox for VTE prophylaxis  N.p.o. for now  aspirin 81 mg daily and clopidogrel 75 mg daily prior to admission, now on ASA 81mg  and plavix 75mg  home meds.   Ongoing aggressive stroke risk factor management  Therapy recommendations:  Pending   Disposition:  Pending   Sepsis, source unclear, improving  T-max 102.3->100.6  Blood  culture NGTD  Leukocytosis WBC 22.6->19.3->16.3  Lactic acid 4.2->2.0->1.1  On Rocephin empiric treatment  CCM on board  Left ureteral stone with hydronephrosis status post stent  CT abdomen showed left upper ureteral stone with ureteral obstruction  Status post double-J stent  Urology recommend on further acute management and signed off  CAD with history of non-STEMI status post  CABG  01/2017 no STEMI status post CABG  On aspirin Plavix PTA  Postop A. fib on amiodarone, converted to NSR  Follow with Dr. Angelena Form as outpatient  Episodes of aphasia and syncope  2 months ago one episode of aphasia but recovered quickly  1 months ago episode of syncope with hot shower  Several months ago episode of gait difficulty with staring spells and amnesia  Had 30-day cardiac event monitoring in 04/20/2017 and 01/18/2018 showed no A. Fib  EEG no seizure  Patient denies any heart palpitation or racing heart  If TEE showes no endocarditis, will discuss with Dr. Angelena Form to consider loop recorder versus anticoagulation  Diabetes  HgbA1c 6.0 goal < 7.0  Glucose fluctuating  CBG monitoring  SSI  DM education and close PCP follow up  Hypertension . Stable on the high end . Gradually normalize BP . Resume  Home meds of coreg and lisinopril  Long term BP goal normotensive  Hyperlipidemia  Home meds: Lipitor 40  LDL 99, goal < 70  Now on lipitor 40 home meds  Continue statin on discharge  Dysphagia, improved  passed swallow today  On diet  Resume home meds  Speech to follow  Other Stroke Risk Factors  Advanced age  Obesity, Body mass index is 31.4 kg/m.   Hx stroke/TIA by MRI chronic bilateral BG infarcts  Other Active Problems  Hypokalemia potassium 3.1->3.3, supplement  Elevated creatinine 1.87-1.79->1.37, continue hydration  Hyperglycemia, stable  Hospital day # 4  ATTENDING NOTE: I reviewed above note and agree with the assessment and plan. Pt was seen and examined.   Patient lying in bed, no family at bedside.  No acute event overnight.  Patient drowsy sleepy, able to open eyes on voice, however easily fall back to sleep without repetitive stimulation.  Not orientated due to sleepiness, but otherwise no significant neuro changes.  Pending TEE tomorrow.  If no endocarditis, plan to put loop recorder to rule out A. Fib.  Blood  culture so far negative.  Continue DAPT and statin for now.  Will follow  Rosalin Hawking, MD PhD Stroke Neurology 03/05/2018 5:26 PM    To contact Stroke Continuity provider, please refer to [email or web address] After hours, contact General Neurology

## 2018-03-05 NOTE — Progress Notes (Signed)
PROGRESS NOTE    Theresa Ray  RCV:[id number] DOB: Jul 16, 1945 DOA: 03/02/2018 PCP: Ronita Hipps, MD   Brief Narrative: Patient is a 72 year old female with past medical history of chronic diastolic CHF, TIAs, diabetes, hypertension, CAD status post CABG x3, postoperative A. fib who was transferred from [facility].  She initially presented to [facility] with altered mental status, left-sided weakness, severe abdominal pain.  Further work-up revealed left ureteral obstruction due to calculus.  Urology was consulted and she underwent double-J stent placement there.  She was transferred to here under ICU service for altered mental status.  MRI showed small multifocal infarcts concerning for embolic stroke.  Neurology, cardiology following.  Currently she is being treated for urosepsis secondary to pyelonephritis secondary to left UPJ obstruction.  Plan for TEE tomorrow. Patient transferred to hospitalist service today.  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  Continue ceftriaxone for now.  Blood cultures negative so far.  I did not see any urine culture sent.  She had T-max of 100.4 this morning.Will send urine culture.  Currently she is hemodynamically stable.  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  She was found to have 6 mm left upper ureteral stone with hydronephrosis and underwent stent placement.  Acute CVA: MRI showed multifocal infarcts.  Suggestive of embolic process.  Ruling out endocarditis versus atrial fibrillation.  TTE showed mild reduction in EF to 50 to 55%, hypokinesis of the inferior lateral and inferior myocardium, mild to moderate MR.  TEE planned  for tomorrow.  Neurology following.  She might need loop recorder placement to assess for occult A. fib.  Currently she is on aspirin, Plavix and statin. Her speech is slow.  She has severe weakness on the left side. EEG did not show any seizure activity. Imagings:MRI: Acute right MCA and punctate left MCA infarcts, chronic bilateral BG infarcts. CT head and neck: no LVO, moderate stenosis right V4, left A2, bilateral M2/M3 and left P2  Occult Afib?:She also has history of postop A. fib after bypass surgery November 2018.  She was also on amiodarone for a while.  30-day event monitoring on October 2019 did not show any atrial fibrillation.  No A. fib noted on telemetry so far. Plan for loop recorder placement tomorrow.  She follows with Dr. Angelena Form as outpatient  History of chronic diastolic CHF: Echocardiogram done here showed mildy  reduced ejection fraction.  She has bilateral basilar crackles.  Will check chest x-ray.  Hypertension: Systolic hypertension.  Started on amlodipine.  Also on lisinopril and carvedilol.  History of coronary disease: Status post CABG in November 2018.  Continue aspirin, Plavix, statin and beta-blocker.  AKI: Creatinine around 1.4 today.  We will continue to monitor BMP.  Diabetes type 2: Hemoglobin A1c of 6.  Continue sliding-scale insulin  Hyperlipidemia: LDL of 99.  Continue statin 40 mg daily.  Hypokalemia: Supplemented with potassium.  Deconditioning/debility/disposition: CIR following.  Most likely she will be discharged to CIR after full work-up.     Nutrition Problem: Inadequate oral intake Etiology: nausea, vomiting      DVT prophylaxis: SCD Code Status: Full Family Communication: Husband present at the bedside Disposition Plan:  CIR after full work-up   Consultants: Cardiology, neurology, PCCM  Procedures: MRI  Antimicrobials: Ceftriaxone, day 4  Subjective:  Patient seen and examined the bedside this morning.  Remains weak ,  lying in bed.  Her speech is slow.  Denies any chest pain, shortness of breath, nausea or vomiting.   Objective: Vitals:   03/04/18 2050 03/05/18 0040 03/05/18 0411 03/05/18 0800  BP: (!) 184/81 (!) 166/87 (!) 171/80 (!) 193/86  Pulse:  87 85 (!) 102  Resp: 20  (!) 32 (!) 32  Temp: 98.6 F (37 C) (!) 100.4 F (38 C) 98.2 F (36.8 C)   TempSrc: Oral Oral Tympanic   SpO2: 96% (!) 89% 90% 93%  Weight:      Height:        Intake/Output Summary (Last 24 hours) at 03/05/2018 1125 Last data filed at 03/05/2018 0600 Gross per 24 hour  Intake 790 ml  Output 775 ml  Net 15 ml   Filed Weights   03/02/18 2135 03/03/18 0500  Weight: 85.5 kg 85.6 kg    Examination:  General exam: Not in distress,obese HEENT:PERRL,Oral mucosa moist, Ear/Nose normal on gross exam Respiratory system: Basilar crackles Cardiovascular system: S1 & S2 heard, RRR. No JVD, murmurs, rubs, gallops or clicks. No pedal edema. Gastrointestinal system: Abdomen is nondistended, soft and nontender. No organomegaly or masses felt. Normal bowel sounds heard. Central nervous system: Alert and oriented. Power 4/5 on the right upper and lower extremity, 3 /5 on left upper and lower extremities.   Extremities: No edema, no clubbing ,no cyanosis, distal peripheral pulses palpable. Skin: No rashes, lesions or ulcers,no icterus ,no pallor GU: Foley.     Data Reviewed: I have personally reviewed following labs and imaging studies  CBC: Recent Labs  Lab 03/02/18 2302 03/03/18 0154 03/04/18 0725 03/05/18 0403  WBC 22.6* 19.3* 16.3* 16.0*  NEUTROABS 20.1*  --   --   --   HGB 13.9 13.2 11.6* 11.0*  HCT 41.5 39.2 34.0* 32.9*  MCV 90.8 90.3 91.4 90.9  PLT 352 327 261 644   Basic Metabolic Panel: Recent Labs  Lab 03/02/18 2302 03/03/18 0154 03/03/18 1154 03/04/18 0725 03/05/18 0403  NA 136 134* 135 139 134*  K 2.9* 3.1* 3.1* 3.3* 3.1*  CL 96* 96* 101 105 100  CO2 22 21* 22 20* 20*  GLUCOSE 224* 210* 190* 144*  152*  BUN 22 23 24* 21 24*  CREATININE 1.87* 1.79* 1.56* 1.37* 1.44*  CALCIUM 8.6* 8.4* 8.1* 8.2* 8.2*   GFR: Estimated Creatinine Clearance: 39.8 mL/min (A) (by C-G formula based on SCr of 1.44 mg/dL (H)). Liver Function Tests: Recent Labs  Lab 03/02/18 2302  AST 32  ALT 19  ALKPHOS 41  BILITOT 0.9  PROT 6.9  ALBUMIN 3.3*   No results for input(s): LIPASE, AMYLASE in the last 168 hours. No results for input(s): AMMONIA in the last 168 hours. Coagulation Profile: No results for input(s): INR, PROTIME in the last 168 hours. Cardiac Enzymes: No results for input(s): CKTOTAL, CKMB, CKMBINDEX, TROPONINI in the last 168 hours. BNP (last 3 results) No results for input(s): PROBNP in the last 8760 hours. HbA1C: Recent Labs    03/03/18 1046  HGBA1C 6.0*   CBG: Recent Labs  Lab 03/04/18 1537 03/04/18 2022 03/05/18 0102 03/05/18 0409 03/05/18 0806  GLUCAP 188* 187* 141* 145* 133*   Lipid Profile: Recent Labs    03/03/18 1046  CHOL 167  HDL 37*  LDLCALC 99  TRIG  157*  CHOLHDL 4.5   Thyroid Function Tests: No results for input(s): TSH, T4TOTAL, FREET4, T3FREE, THYROIDAB in the last 72 hours. Anemia Panel: No results for input(s): VITAMINB12, FOLATE, FERRITIN, TIBC, IRON, RETICCTPCT in the last 72 hours. Sepsis Labs: Recent Labs  Lab 03/03/18 0154 03/03/18 1154 03/03/18 1347 03/04/18 0725  LATICACIDVEN 4.2* 1.8 2.0* 1.1    Recent Results (from the past 240 hour(s))  MRSA PCR Screening     Status: None   Collection Time: 03/02/18  9:53 PM  Result Value Ref Range Status   MRSA by PCR NEGATIVE NEGATIVE Final    Comment:        The GeneXpert MRSA Assay (FDA approved for NASAL specimens only), is one component of a comprehensive MRSA colonization surveillance program. It is not intended to diagnose MRSA infection nor to guide or monitor treatment for MRSA infections. Performed at [facility], [place] [address], [place], [place] [address]   Culture,  blood (Routine X 2) w Reflex to ID Panel     Status: None (Preliminary result)   Collection Time: 03/03/18 10:46 AM  Result Value Ref Range Status   Specimen Description BLOOD LEFT HAND  Final   Special Requests   Final    BOTTLES DRAWN AEROBIC ONLY Blood Culture adequate volume   Culture   Final    NO GROWTH < 24 HOURS Performed at [facility], [place] [address], [place], [place] [address]    Report Status PENDING  Incomplete  Culture, blood (Routine X 2) w Reflex to ID Panel     Status: None (Preliminary result)   Collection Time: 03/03/18 10:46 AM  Result Value Ref Range Status   Specimen Description BLOOD RIGHT HAND  Final   Special Requests   Final    BOTTLES DRAWN AEROBIC ONLY Blood Culture adequate volume   Culture   Final    NO GROWTH < 24 HOURS Performed at [facility], [place] [address] [place], [place] [address]    Report Status PENDING  Incomplete         Radiology Studies: No results found.      Scheduled Meds: . amLODipine  10 mg Oral Daily  . aspirin EC  81 mg Oral Daily  . atorvastatin  40 mg Oral Daily  . carvedilol  12.5 mg Oral BID WC  . chlorhexidine  15 mL Mouth Rinse BID  . citalopram  10 mg Oral Daily  . clopidogrel  75 mg Oral Daily  . insulin aspart  0-15 Units Subcutaneous Q4H  . lactose free nutrition  237 mL Oral TID WC  . levothyroxine  175 mcg Oral QAC breakfast  . lisinopril  5 mg Oral Daily  . mouth rinse  15 mL Mouth Rinse q12n4p  . multivitamin with minerals  1 tablet Oral Daily  . pantoprazole  40 mg Oral Daily  . potassium chloride  40 mEq Oral Once   Continuous Infusions: . sodium chloride Stopped (03/03/18 1712)  . cefTRIAXone (ROCEPHIN)  IV 1 g (03/04/18 2300)     LOS: 3 days    Time spent: More than 50% of that time was spent in counseling and/or coordination of care.      Shelly Coss, MD [facility] Pager [phone]  If 7PM-7AM, please contact night-coverage [email or web address] Password  [email or web address] 03/05/2018, 11:25 AM

## 2018-03-05 NOTE — Progress Notes (Signed)
PT Progress Note for Charges    03/05/18 1600  PT General Charges  $$ ACUTE PT VISIT 1 Visit  PT Treatments  $Therapeutic Activity 23-37 mins  Sherie Don, [place], DPT  Acute Rehabilitation Services Pager [phone] Office [phone]

## 2018-03-06 ENCOUNTER — Encounter (HOSPITAL_COMMUNITY): Payer: Self-pay | Admitting: *Deleted

## 2018-03-06 ENCOUNTER — Inpatient Hospital Stay (HOSPITAL_COMMUNITY): Payer: Medicare HMO

## 2018-03-06 ENCOUNTER — Inpatient Hospital Stay (HOSPITAL_COMMUNITY): Payer: Medicare HMO | Admitting: Certified Registered Nurse Anesthetist

## 2018-03-06 ENCOUNTER — Encounter (HOSPITAL_COMMUNITY)
Admission: AD | Disposition: A | Discharge status: Rehabilitation Facility | Payer: Self-pay | Source: Other Acute Inpatient Hospital | Attending: Internal Medicine

## 2018-03-06 DIAGNOSIS — I6389 Other cerebral infarction: Secondary | ICD-10-CM

## 2018-03-06 HISTORY — PX: LOOP RECORDER INSERTION: EP1214

## 2018-03-06 HISTORY — PX: TEE WITHOUT CARDIOVERSION: SHX5443

## 2018-03-06 LAB — CBC
HCT: 33.8 % — ABNORMAL LOW (ref 36.0–46.0)
Hemoglobin: 11.4 g/dL — ABNORMAL LOW (ref 12.0–15.0)
MCH: 30.5 pg (ref 26.0–34.0)
MCHC: 33.7 g/dL (ref 30.0–36.0)
MCV: 90.4 fL (ref 80.0–100.0)
Platelets: 345 10*3/uL (ref 150–400)
RBC: 3.74 MIL/uL — ABNORMAL LOW (ref 3.87–5.11)
RDW: 12.3 % (ref 11.5–15.5)
WBC: 12 10*3/uL — ABNORMAL HIGH (ref 4.0–10.5)
nRBC: 0 % (ref 0.0–0.2)

## 2018-03-06 LAB — GLUCOSE, CAPILLARY
GLUCOSE-CAPILLARY: 122 mg/dL — AB (ref 70–99)
Glucose-Capillary: 128 mg/dL — ABNORMAL HIGH (ref 70–99)
Glucose-Capillary: 153 mg/dL — ABNORMAL HIGH (ref 70–99)
Glucose-Capillary: 162 mg/dL — ABNORMAL HIGH (ref 70–99)
Glucose-Capillary: 202 mg/dL — ABNORMAL HIGH (ref 70–99)

## 2018-03-06 LAB — BASIC METABOLIC PANEL
Anion gap: 13 (ref 5–15)
BUN: 24 mg/dL — ABNORMAL HIGH (ref 8–23)
CO2: 23 mmol/L (ref 22–32)
Calcium: 8.4 mg/dL — ABNORMAL LOW (ref 8.9–10.3)
Chloride: 103 mmol/L (ref 98–111)
Creatinine, Ser: 1.62 mg/dL — ABNORMAL HIGH (ref 0.44–1.00)
GFR calc Af Amer: 37 mL/min — ABNORMAL LOW (ref >60)
GFR calc non Af Amer: 32 mL/min — ABNORMAL LOW (ref >60)
Glucose, Bld: 137 mg/dL — ABNORMAL HIGH (ref 70–99)
Potassium: 3.2 mmol/L — ABNORMAL LOW (ref 3.5–5.1)
Sodium: 139 mmol/L (ref 135–145)

## 2018-03-06 LAB — URINE CULTURE: Culture: NO GROWTH

## 2018-03-06 SURGERY — LOOP RECORDER INSERTION
Anesthesia: LOCAL

## 2018-03-06 SURGERY — ECHOCARDIOGRAM, TRANSESOPHAGEAL
Anesthesia: Monitor Anesthesia Care

## 2018-03-06 MED ORDER — LIDOCAINE-EPINEPHRINE 1 %-1:100000 IJ SOLN
INTRAMUSCULAR | Status: DC | PRN
  Administered 2018-03-06: 10 mL

## 2018-03-06 MED ORDER — SODIUM CHLORIDE 0.9 % IV SOLN
INTRAVENOUS | Status: DC | PRN
  Administered 2018-03-06: 10:00:00 via INTRAVENOUS

## 2018-03-06 MED ORDER — ACETAMINOPHEN 325 MG PO TABS
650.0000 mg | ORAL_TABLET | Freq: Four times a day (QID) | ORAL | Status: DC | PRN
  Administered 2018-03-06 – 2018-03-08 (×4): 650 mg via ORAL
  Filled 2018-03-06 (×4): qty 2

## 2018-03-06 MED ORDER — PROPOFOL 10 MG/ML IV BOLUS
INTRAVENOUS | Status: DC | PRN
  Administered 2018-03-06 (×2): 10 mg via INTRAVENOUS
  Administered 2018-03-06: 40 mg via INTRAVENOUS

## 2018-03-06 MED ORDER — LIDOCAINE-EPINEPHRINE 1 %-1:100000 IJ SOLN
INTRAMUSCULAR | Status: AC
  Filled 2018-03-06: qty 1

## 2018-03-06 MED ORDER — CEFDINIR 300 MG PO CAPS
300.0000 mg | ORAL_CAPSULE | Freq: Two times a day (BID) | ORAL | Status: DC
  Administered 2018-03-07 – 2018-03-09 (×5): 300 mg via ORAL
  Filled 2018-03-06 (×7): qty 1

## 2018-03-06 MED ORDER — PROPOFOL 500 MG/50ML IV EMUL
INTRAVENOUS | Status: DC | PRN
  Administered 2018-03-06: 100 ug/kg/min via INTRAVENOUS

## 2018-03-06 MED ORDER — POTASSIUM CHLORIDE CRYS ER 20 MEQ PO TBCR: 40.0000 meq | EXTENDED_RELEASE_TABLET | Freq: Once | ORAL | Status: DC

## 2018-03-06 SURGICAL SUPPLY — 2 items
LOOP REVEAL LINQSYS (Prosthesis & Implant Heart) ×1 IMPLANT
PACK LOOP INSERTION (CUSTOM PROCEDURE TRAY) ×2 IMPLANT

## 2018-03-06 NOTE — Progress Notes (Signed)
  Echocardiogram Echocardiogram Transesophageal has been performed.  [name] 03/06/2018, 11:16 AM

## 2018-03-06 NOTE — Progress Notes (Signed)
STROKE TEAM PROGRESS NOTE   SUBJECTIVE (INTERVAL HISTORY) Her husband is at the bedside. Pt lying in bed, awake alert and interactive. She had TEE showed no endocarditis or PFO. EP recommend loop recorder.   OBJECTIVE Temp:  [98.2 F (36.8 C)-99.5 F (37.5 C)] 99.5 F (37.5 C) (12/06 0949) Pulse Rate:  [62-87] 70 (12/06 1212) Cardiac Rhythm: Normal sinus rhythm (12/06 1200) Resp:  [12-24] 18 (12/06 1212) BP: (138-173)/(63-91) 150/81 (12/06 1212) SpO2:  [90 %-96 %] 96 % (12/06 1212)  Recent Labs  Lab 03/05/18 1636 03/05/18 2038 03/06/18 0058 03/06/18 0431 03/06/18 0736  GLUCAP 167* 157* 122* 128* 153*   Recent Labs  Lab 03/03/18 0154 03/03/18 1154 03/04/18 0725 03/05/18 0403 03/06/18 0441  NA 134* 135 139 134* 139  K 3.1* 3.1* 3.3* 3.1* 3.2*  CL 96* 101 105 100 103  CO2 21* 22 20* 20* 23  GLUCOSE 210* 190* 144* 152* 137*  BUN 23 24* 21 24* 24*  CREATININE 1.79* 1.56* 1.37* 1.44* 1.62*  CALCIUM 8.4* 8.1* 8.2* 8.2* 8.4*   Recent Labs  Lab 03/02/18 2302  AST 32  ALT 19  ALKPHOS 41  BILITOT 0.9  PROT 6.9  ALBUMIN 3.3*   Recent Labs  Lab 03/02/18 2302 03/03/18 0154 03/04/18 0725 03/05/18 0403 03/06/18 0441  WBC 22.6* 19.3* 16.3* 16.0* 12.0*  NEUTROABS 20.1*  --   --   --   --   HGB 13.9 13.2 11.6* 11.0* 11.4*  HCT 41.5 39.2 34.0* 32.9* 33.8*  MCV 90.8 90.3 91.4 90.9 90.4  PLT 352 327 261 325 345   No results for input(s): CKTOTAL, CKMB, CKMBINDEX, TROPONINI in the last 168 hours. No results for input(s): LABPROT, INR in the last 72 hours. No results for input(s): COLORURINE, LABSPEC, [place], GLUCOSEU, HGBUR, BILIRUBINUR, KETONESUR, PROTEINUR, UROBILINOGEN, NITRITE, LEUKOCYTESUR in the last 72 hours.  Invalid input(s): APPERANCEUR     Component Value Date/Time   CHOL 167 03/03/2018 1046   TRIG 157 (H) 03/03/2018 1046   HDL 37 (L) 03/03/2018 1046   CHOLHDL 4.5 03/03/2018 1046   VLDL 31 03/03/2018 1046   LDLCALC 99 03/03/2018 1046   Lab Results   Component Value Date   HGBA1C 6.0 (H) 03/03/2018   No results found for: LABOPIA, COCAINSCRNUR, LABBENZ, AMPHETMU, THCU, LABBARB  No results for input(s): ETH in the last 168 hours.  I have personally reviewed the radiological images below and agree with the radiology interpretations.  Ct Head Wo Contrast  Result Date: 03/02/2018 CLINICAL DATA:  72 year old female with altered mental status. EXAM: CT HEAD WITHOUT CONTRAST TECHNIQUE: Contiguous axial images were obtained from the base of the skull through the vertex without intravenous contrast. COMPARISON:  Head CT dated 03/02/2018 FINDINGS: Brain: There is mild age-related atrophy. Moderate to advanced periventricular and deep white matter hypodensities most consistent with chronic microvascular ischemic changes and similar to prior CT. Left basal ganglia old lacunar infarct. There is no acute intracranial hemorrhage. No mass effect or midline shift. No extra-axial fluid collection. Vascular: No hyperdense vessel or unexpected calcification. Skull: Normal. Negative for fracture or focal lesion. Sinuses/Orbits: Mild mucoperiosteal thickening of paranasal sinuses. No air-fluid levels. The mastoid air cells are clear. Other: None IMPRESSION: 1. No acute intracranial hemorrhage. 2. Age-related atrophy and chronic microvascular ischemic changes. Electronically Signed   By: Anner Crete M.D.   On: 03/02/2018 23:09   Mr Brain Wo Contrast  Result Date: 03/03/2018 CLINICAL DATA:  Altered mental status.  LEFT-sided weakness. EXAM: MRI  HEAD WITHOUT CONTRAST TECHNIQUE: Multiplanar, multiecho pulse sequences of the brain and surrounding structures were obtained without intravenous contrast. COMPARISON:  CT HEAD March 02, 2018 and MRI head May 14, 2012. FINDINGS: INTRACRANIAL CONTENTS: Subcentimeter foci reduced diffusion LEFT frontal, RIGHT parietal and bilateral temporal lobes with low ADC values. Small areas of confluent reduced diffusion RIGHT  parietal and occipital lobes with corresponding low ADC values. Susceptibility artifact associated with bilateral old basal ganglia infarcts. Old small RIGHT inferior cerebellar infarct. No susceptibility artifact to suggest interval hemorrhage. Confluent supratentorial white matter FLAIR T2 hyperintensities. Mild ex vacuo dilatation bilateral lateral ventricles. No hydrocephalus. No abnormal extra-axial fluid collections. VASCULAR: Normal major intracranial vascular flow voids present at skull base. SKULL AND UPPER CERVICAL SPINE: No abnormal sellar expansion. No suspicious calvarial bone marrow signal. Craniocervical junction maintained. SINUSES/ORBITS: The mastoid air-cells and included paranasal sinuses are well-aerated.The included ocular globes and orbital contents are non-suspicious. OTHER: None. IMPRESSION: 1. Multifocal acute bilateral small infarcts spanning multiple vascular territories most compatible with embolic phenomena. 2. Moderate to severe chronic small vessel ischemic changes. 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 Critical Value/emergent results text paged to [place] via [employer] secure system on 03/03/2018 at 5:57 am, including interpreting physician's phone number. Electronically Signed   By: Elon Alas M.D.   On: 03/03/2018 05:58     PHYSICAL EXAM  Temp:  [98.2 F (36.8 C)-99.5 F (37.5 C)] 99.5 F (37.5 C) (12/06 0949) Pulse Rate:  [62-87] 70 (12/06 1212) Resp:  [12-24] 18 (12/06 1212) BP: (138-173)/(63-91) 150/81 (12/06 1212) SpO2:  [90 %-96 %] 96 % (12/06 1212)  General - Well nourished, well developed, in no apparent distress.  Cardiovascular - Regular rate and rhythm.  Neuro - awake alert and oriented and follow commands.Speech is clear and naming 2/2 and able to repeat.  No dysarthria.  PERRL, able to attend to both sides, but not cooperative on visual field test with confrontation. Blinking to visual threat on the right, inconsistently blinking  to visual threat on the left. Facial symmetrical, tongue midline. Bilateral upper extremity 4/5, BLE 3/5 proximal and 4/5 distal. Mild left UE and LE neglect.  DTR 1+, Babinski positive on the left.  Sensation symmetrical and coordination intact BUE.  Gait not tested.   ASSESSMENT/PLAN Ms. AMBERMARIE HONEYMAN is a 72 y.o. female with history of hypertension, diabetes, hyperlipidemia, CHF, CAD/MI, postop A. fib, admitted for altered mental status and left-sided weakness. No tPA given due to outside window.    Stroke: Acute right MCA, MCA/PCA, MCA/ACA and left punctate MCA infarcts, cardioembolic pattern, source unclear, concerning for possible A. fib  Resultant mild lethargy   CT head no acute abnormality  MRI acute right MCA and punctate left MCA infarcts, chronic bilateral BG infarcts.  CT head and neck no LVO, moderate stenosis right V4, left A2, bilateral M2/M3 and left P2  2D Echo EF 50-55%  EEG no seizure, general slowing  TEE no endocarditis, no PFO  Loop recorder placed to rule out afib  LDL 99  HgbA1c 6.0  Lovenox for VTE prophylaxis  N.p.o. for now  aspirin 81 mg daily and clopidogrel 75 mg daily prior to admission, now on ASA 81mg  and plavix 75mg  home meds. Continue on discharge.  Ongoing aggressive stroke risk factor management  Therapy recommendations:  Pending   Disposition:  Pending   Sepsis, source unclear, improving  T-max 102.3->100.6->afebrile  Blood culture NGTD  Leukocytosis WBC 22.6->19.3->16.3->12.0  Lactic acid 4.2->2.0->1.1  On Rocephin empiric  treatment  CCM on board  Left ureteral stone with hydronephrosis status post stent  CT abdomen showed left upper ureteral stone with ureteral obstruction  Status post double-J stent  Urology recommend on further acute management and signed off  CAD with history of non-STEMI status post CABG  01/2017 no STEMI status post CABG  On aspirin Plavix PTA  Postop A. fib on amiodarone, converted to  NSR  Follow with Dr. Angelena Form as outpatient  Episodes of aphasia and syncope  2 months ago one episode of aphasia but recovered quickly  1 months ago episode of syncope with hot shower  Several months ago episode of gait difficulty with staring spells and amnesia  Had 30-day cardiac event monitoring in 04/20/2017 and 01/18/2018 showed no A. Fib  EEG no seizure  Patient denies any heart palpitation or racing heart  Loop recorder placed to rule out afib  Diabetes  HgbA1c 6.0 goal < 7.0  Glucose fluctuating  CBG monitoring  SSI  DM education and close PCP follow up  Hypertension . Stable on the high end . Gradually normalize BP . Resume  Home meds of coreg and lisinopril  Long term BP goal normotensive  Hyperlipidemia  Home meds: Lipitor 40  LDL 99, goal < 70  Now on lipitor 40 home meds  Continue statin on discharge  Dysphagia, improved  passed swallow today  On diet  Resume home meds  Speech to follow  Other Stroke Risk Factors  Advanced age  Obesity, Body mass index is 31.4 kg/m.   Hx stroke/TIA by MRI chronic bilateral BG infarcts  Other Active Problems  Hypokalemia potassium 3.1->3.3->3.2, supplement  Elevated creatinine 1.87-1.79->1.37->1.62, continue hydration  Hyperglycemia, stable  Hospital day # 4  Neurology will sign off. Please call with questions. Pt will follow up with stroke clinic NP at [facility] in about 4 weeks. Thanks for the consult.   Rosalin Hawking, MD PhD Stroke Neurology 03/06/2018 4:01 PM    To contact Stroke Continuity provider, please refer to [email or web address] After hours, contact General Neurology

## 2018-03-06 NOTE — Anesthesia Preprocedure Evaluation (Signed)
Anesthesia Evaluation  Patient identified by MRN, date of birth, ID band Patient awake    Reviewed: Allergy & Precautions, NPO status , Patient's Chart, lab work & pertinent test results  Airway Mallampati: II       Dental no notable dental hx. (+) Teeth Intact   Pulmonary former smoker,    Pulmonary exam normal breath sounds clear to auscultation       Cardiovascular hypertension, Pt. on medications and Pt. on home beta blockers Normal cardiovascular exam Rhythm:Regular Rate:Normal     Neuro/Psych    GI/Hepatic   Endo/Other  diabetes, Insulin DependentHypothyroidism   Renal/GU Renal disease     Musculoskeletal   Abdominal (+) + obese,   Peds  Hematology  (+) Blood dyscrasia, anemia ,   Anesthesia Other Findings   Reproductive/Obstetrics                             Anesthesia Physical Anesthesia Plan  ASA: III  Anesthesia Plan: MAC   Post-op Pain Management:    Induction:   PONV Risk Score and Plan:   Airway Management Planned: Natural Airway and Simple Face Mask  Additional Equipment:   Intra-op Plan:   Post-operative Plan:   Informed Consent: I have reviewed the patients History and Physical, chart, labs and discussed the procedure including the risks, benefits and alternatives for the proposed anesthesia with the patient or authorized representative who has indicated his/her understanding and acceptance.   Dental advisory given  Plan Discussed with: CRNA  Anesthesia Plan Comments:         Anesthesia Quick Evaluation

## 2018-03-06 NOTE — Consult Note (Addendum)
ELECTROPHYSIOLOGY CONSULT NOTE  Patient ID: Theresa Ray MRN: [id number], DOB/AGE: 08-May-1945   Admit date: 03/02/2018 Date of Consult: 03/06/2018  Primary Physician: Ronita Hipps, MD Primary Cardiologist: Dr. Angelena Form Reason for Consultation: Cryptogenic stroke ; recommendations regarding Implantable Loop Recorder, requested by Dr. Erlinda Hong  History of Present Illness Theresa Ray was admitted on 03/02/2018 with AMS, L sided weakness, and belly pain, noted with elevated WBC, left ureteric obstruction secondary to kidney stone and she was taken to the OR for double-J stent she was found with what seem persistent AMS transferred from [facility] for further management of stroke PMHx noted for prior TIA, DM, arthritis, hypothyroidism, CAD.  Nov 2018 had NSTEMI >. CABG 02/24/17, she had AFib on POD #3 returning to [facility] with IV amiodarone.  She has worn 2 EM the most recent Oct 2019, neither noting any AF They first developed symptoms while at home.  Imaging demonstrated Acute right MCA, MCA/PCA, MCA/ACA and left punctate MCA infarcts, cardioembolic pattern, source unclear, concerning for endocarditis versus undiagnosed A. fib.  she has undergone workup for stroke including TEE  and carotid angio without LVO.  The patient has been monitored on telemetry which has demonstrated sinus rhythm with no arrhythmias.   Echocardiogram this admission demonstrated   Study Conclusions - Procedure narrative: Transthoracic echocardiography. Image   quality was adequate. The study was technically difficult. - Left ventricle: The cavity size was normal. Wall thickness was   increased in a pattern of mild LVH. Systolic function was normal.   The estimated ejection fraction was in the range of 50% to 55%.   There is hypokinesis of the inferolateral and inferior   myocardium. The study is not technically sufficient to allow   evaluation of LV diastolic function. - Mitral valve: There was mild to moderate  regurgitation. - Left atrium: The atrium was mildly dilated. Impressions: - Hypokinesis of the inferior/inferolateral walls with overall low   normal LV systolic function; mild LVH; mild to moderate MR; mild   LAE.  03/06/18: TEE No intracardiac source of embolism, negative bubble study, no PFO or ASD.  Lab work is reviewed. WBC down trending to 12 today from 16 Afebrile today Last fever 100.4 on 03/05/18 @ 0040  Plans for CIR admission, is in process  Past Medical History:  Diagnosis Date  . CAD in native artery    a. s/p CABGx3 in 01/2017.  Marland Kitchen Chronic diastolic CHF (congestive heart failure) ([place])   . Diabetes mellitus ([place])   . Diabetic neuropathy ([place])   . History of kidney stones    history of stones  . Hypertension   . Hypothyroidism   . NSTEMI (non-ST elevated myocardial infarction) ([place])   . Orthostatic hypotension   . Postoperative atrial fibrillation ([place])    a. after CABG 01/2017.  Marland Kitchen TIA (transient ischemic attack)      Surgical History:  Past Surgical History:  Procedure Laterality Date  . ABDOMINAL HYSTERECTOMY    . CORONARY ARTERY BYPASS GRAFT N/A 02/24/2017   Procedure: CORONARY ARTERY BYPASS GRAFTING (CABG) USING LEFT INTERNAL MAMMARY ARTERY TO LAD AND ENDOSCOPICALLY HARVESTED GREATER SAPHENOUS VEIN TO PDA AND TO OM1.;  Surgeon: Grace Isaac, MD;  Location: [facility];  Service: Open Heart Surgery;  Laterality: N/A;  . ENDOVEIN HARVEST OF GREATER SAPHENOUS VEIN Right 02/24/2017   Procedure: ENDOVEIN HARVEST OF GREATER SAPHENOUS VEIN;  Surgeon: Grace Isaac, MD;  Location: [facility];  Service: Open Heart Surgery;  Laterality:  Right;  Marland Kitchen LAPAROSCOPIC GASTRIC BANDING    . LEFT HEART CATH AND CORONARY ANGIOGRAPHY N/A 02/18/2017   Procedure: LEFT HEART CATH AND CORONARY ANGIOGRAPHY;  Surgeon: Leonie Man, MD;  Location: [facility];  Service: Cardiovascular;  Laterality: N/A;  . TEE WITHOUT CARDIOVERSION N/A 02/24/2017   Procedure: TRANSESOPHAGEAL  ECHOCARDIOGRAM (TEE);  Surgeon: Grace Isaac, MD;  Location: [facility];  Service: Open Heart Surgery;  Laterality: N/A;     Medications Prior to Admission  Medication Sig Dispense Refill Last Dose  . acetaminophen (TYLENOL) 325 MG tablet Take 2 tablets (650 mg total) by mouth every 6 (six) hours as needed for mild pain.   02/28/2018 at prn  . aspirin EC 81 MG tablet Take 1 tablet (81 mg total) by mouth daily. 90 tablet 3 03/01/2018  . atorvastatin (LIPITOR) 40 MG tablet Take 1 tablet (40 mg total) by mouth daily. 90 tablet 3 03/01/2018  . Carboxymethylcellul-Glycerin (CLEAR EYES FOR DRY EYES) 1-0.25 % SOLN Place 1 drop into both eyes as needed.   03/01/2018 at prn  . carvedilol (COREG) 12.5 MG tablet Take 1 tablet (12.5 mg total) by mouth 2 (two) times daily. 60 tablet 11 03/01/2018 at 1030  . cholecalciferol (VITAMIN D) 1000 units tablet Take 3,000 Units 2 (two) times daily by mouth.   03/01/2018  . clopidogrel (PLAVIX) 75 MG tablet Take 1 tablet (75 mg total) by mouth daily. 90 tablet 3 03/01/2018  . co-enzyme Q-10 30 MG capsule Take 30 mg daily by mouth.   03/01/2018  . glipiZIDE (GLUCOTROL) 10 MG tablet Take 10 mg 2 (two) times daily by mouth.   03/01/2018  . Glucosamine-Chondroit-Vit C-Mn (GLUCOSAMINE CHONDR 500 COMPLEX PO) Take 1 tablet 2 (two) times daily by mouth.   03/01/2018  . Krill Oil 1000 MG CAPS Take 1,000 mg daily by mouth.   03/01/2018  . levothyroxine (SYNTHROID, LEVOTHROID) 175 MCG tablet Take 175 mcg daily before breakfast by mouth.   03/01/2018 at 1030  . lisinopril (PRINIVIL,ZESTRIL) 5 MG tablet Take 1 tablet (5 mg total) by mouth daily. 30 tablet 3 03/01/2018  . MELATONIN PO Take 10 mg at bedtime by mouth.   03/01/2018  . metFORMIN (GLUCOPHAGE) 1000 MG tablet Take 1,000 mg 2 (two) times daily by mouth.   03/01/2018  . Multiple Vitamin (MULTIVITAMIN WITH MINERALS) TABS tablet Take 1 tablet daily by mouth.   03/01/2018  . traMADol (ULTRAM) 50 MG tablet Take 50 mg by mouth 2 (two) times  daily. For restless leg  1 03/01/2018    Inpatient Medications:  . amLODipine  10 mg Oral Daily  . aspirin EC  81 mg Oral Daily  . atorvastatin  40 mg Oral Daily  . carvedilol  12.5 mg Oral BID WC  . chlorhexidine  15 mL Mouth Rinse BID  . citalopram  10 mg Oral Daily  . clopidogrel  75 mg Oral Daily  . insulin aspart  0-15 Units Subcutaneous Q4H  . lactose free nutrition  237 mL Oral TID WC  . levothyroxine  175 mcg Oral QAC breakfast  . lisinopril  5 mg Oral Daily  . mouth rinse  15 mL Mouth Rinse q12n4p  . multivitamin with minerals  1 tablet Oral Daily  . pantoprazole  40 mg Oral Daily  . potassium chloride  40 mEq Oral Daily    Allergies:  Allergies  Allergen Reactions  . Sulfa Antibiotics Nausea And Vomiting  . Shellfish Allergy Other (See Comments)  UNKNOWN REACTION.  Allergy found on ALLERGY TEST.  . Aggrenox [Aspirin-Dipyridamole Er] Other (See Comments)    Redness in eyes, takes aspirin 325 at home at baseline    Social History   Socioeconomic History  . Marital status: Married    Spouse name: Not on file  . Number of children: Not on file  . Years of education: Not on file  . Highest education level: Not on file  Occupational History  . Not on file  Social Needs  . Financial resource strain: Not on file  . Food insecurity:    Worry: Not on file    Inability: Not on file  . Transportation needs:    Medical: Not on file    Non-medical: Not on file  Tobacco Use  . Smoking status: Former Smoker    Types: Cigarettes    Start date: 1967    Last attempt to quit: 1975    Years since quitting: 44.9  . Smokeless tobacco: Never Used  Substance and Sexual Activity  . Alcohol use: No    Frequency: Never  . Drug use: No  . Sexual activity: Not on file  Lifestyle  . Physical activity:    Days per week: Not on file    Minutes per session: Not on file  . Stress: Not on file  Relationships  . Social connections:    Talks on phone: Not on file    Gets  together: Not on file    Attends religious service: Not on file    Active member of club or organization: Not on file    Attends meetings of clubs or organizations: Not on file    Relationship status: Not on file  . Intimate partner violence:    Fear of current or ex partner: Not on file    Emotionally abused: Not on file    Physically abused: Not on file    Forced sexual activity: Not on file  Other Topics Concern  . Not on file  Social History Narrative  . Not on file     Family History  Problem Relation Age of Onset  . Heart disease Mother 13       CABG  . CVA Father       Review of Systems: All other systems reviewed and are otherwise negative except as noted above.  Physical Exam: Vitals:   03/06/18 1100 03/06/18 1105 03/06/18 1110 03/06/18 1212  BP:  (!) 146/69  (!) 150/81  Pulse:  64 64 70  Resp:  (!) 23 (!) 22 18  Temp:      TempSrc:      SpO2: 94% 93% 93% 96%  Weight:      Height:        GEN- The patient is well appearing, alert and oriented x 3 today.   Head- normocephalic, atraumatic Eyes-  Sclera clear, conjunctiva pink Ears- hearing intact Oropharynx- clear Neck- supple Lungs- CTA b/l, normal work of breathing Heart- RRR, no murmurs, rubs or gallops  GI- soft, NT, ND Extremities- no clubbing, cyanosis, or edema MS- no significant deformity or atrophy Skin- no rash or lesion Psych- euthymic mood, full affect   Labs:   Lab Results  Component Value Date   WBC 12.0 (H) 03/06/2018   HGB 11.4 (L) 03/06/2018   HCT 33.8 (L) 03/06/2018   MCV 90.4 03/06/2018   PLT 345 03/06/2018    Recent Labs  Lab 03/02/18 2302  03/06/18 0441  NA 136   < >  139  K 2.9*   < > 3.2*  CL 96*   < > 103  CO2 22   < > 23  BUN 22   < > 24*  CREATININE 1.87*   < > 1.62*  CALCIUM 8.6*   < > 8.4*  PROT 6.9  --   --   BILITOT 0.9  --   --   ALKPHOS 41  --   --   ALT 19  --   --   AST 32  --   --   GLUCOSE 224*   < > 137*   < > = values in this interval not  displayed.   Lab Results  Component Value Date   TROPONINI 1.08 (HH) 02/17/2017   Lab Results  Component Value Date   CHOL 167 03/03/2018   CHOL 125 02/17/2017   Lab Results  Component Value Date   HDL 37 (L) 03/03/2018   HDL 30 (L) 02/17/2017   Lab Results  Component Value Date   LDLCALC 99 03/03/2018   LDLCALC 23 02/17/2017   Lab Results  Component Value Date   TRIG 157 (H) 03/03/2018   TRIG 358 (H) 02/17/2017   Lab Results  Component Value Date   CHOLHDL 4.5 03/03/2018   CHOLHDL 4.2 02/17/2017   No results found for: LDLDIRECT  No results found for: DDIMER   Radiology/Studies:   Dg Chest 1 View Result Date: 03/05/2018 CLINICAL DATA:  Shortness of Breath EXAM: CHEST  1 VIEW COMPARISON:  03/02/2018 FINDINGS: Cardiac shadow is stable. Increased vascular congestion is noted with patchy bibasilar infiltrates and mild interstitial edema. No sizable effusion is seen. No bony abnormality is noted. IMPRESSION: Changes of mild CHF with bibasilar infiltrates identified. Electronically Signed   By: Inez Catalina M.D.   On: 03/05/2018 14:38    Ct Head Wo Contrast Result Date: 03/02/2018 CLINICAL DATA:  72 year old female with altered mental status. EXAM: CT HEAD WITHOUT CONTRAST TECHNIQUE: Contiguous axial images were obtained from the base of the skull through the vertex without intravenous contrast. COMPARISON:  Head CT dated 03/02/2018 FINDINGS: Brain: There is mild age-related atrophy. Moderate to advanced periventricular and deep white matter hypodensities most consistent with chronic microvascular ischemic changes and similar to prior CT. Left basal ganglia old lacunar infarct. There is no acute intracranial hemorrhage. No mass effect or midline shift. No extra-axial fluid collection. Vascular: No hyperdense vessel or unexpected calcification. Skull: Normal. Negative for fracture or focal lesion. Sinuses/Orbits: Mild mucoperiosteal thickening of paranasal sinuses. No air-fluid  levels. The mastoid air cells are clear. Other: None IMPRESSION: 1. No acute intracranial hemorrhage. 2. Age-related atrophy and chronic microvascular ischemic changes. Electronically Signed   By: Anner Crete M.D.   On: 03/02/2018 23:09    Mr Brain Wo Contrast Result Date: 03/03/2018 CLINICAL DATA:  Altered mental status.  LEFT-sided weakness. EXAM: MRI HEAD WITHOUT CONTRAST TECHNIQUE: Multiplanar, multiecho pulse sequences of the brain and surrounding structures were obtained without intravenous contrast. COMPARISON:  CT HEAD March 02, 2018 and MRI head May 14, 2012. FINDINGS: INTRACRANIAL CONTENTS: Subcentimeter foci reduced diffusion LEFT frontal, RIGHT parietal and bilateral temporal lobes with low ADC values. Small areas of confluent reduced diffusion RIGHT parietal and occipital lobes with corresponding low ADC values. Susceptibility artifact associated with bilateral old basal ganglia infarcts. Old small RIGHT inferior cerebellar infarct. No susceptibility artifact to suggest interval hemorrhage. Confluent supratentorial white matter FLAIR T2 hyperintensities. Mild ex vacuo dilatation bilateral lateral ventricles. No hydrocephalus. No abnormal  extra-axial fluid collections. VASCULAR: Normal major intracranial vascular flow voids present at skull base. SKULL AND UPPER CERVICAL SPINE: No abnormal sellar expansion. No suspicious calvarial bone marrow signal. Craniocervical junction maintained. SINUSES/ORBITS: The mastoid air-cells and included paranasal sinuses are well-aerated.The included ocular globes and orbital contents are non-suspicious. OTHER: None. IMPRESSION: 1. Multifocal acute bilateral small infarcts spanning multiple vascular territories most compatible with embolic phenomena. 2. Moderate to severe chronic small vessel ischemic changes. 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 Critical Value/emergent results text paged to [place] via [employer] secure system on  03/03/2018 at 5:57 am, including interpreting physician's phone number. Electronically Signed   By: Elon Alas M.D.   On: 03/03/2018 05:58    12-lead ECG SR Today's with PACs All prior EKG's in EPIC reviewed with no documented atrial fibrillation  Telemetry SR, often with frequent PACs  Assessment and Plan:  1. Cryptogenic stroke The patient presents with cryptogenic stroke.   Dr. Curt Bears spoke at length with the patient and her husband at bedside about monitoring for afib with  an implantable loop recorder given 2 EM since her CABG (and p/o AF episode) have not revealed recurrent AF.  Risks, benefits, and alteratives to implantable loop recorder were discussed with the patient today.   At this time, the patient is s/p TEE earlier today, she is clear, recalls her prior conversation with her other physicians here about monitoring, she would prefer not to wear another monitor and would like to proceed with implantable loop recorder.    Wound care was reviewed with the patient (keep incision clean and dry for 3 days).  I have staff messaged our scheduler to call the patient with a wound check appointment (our office currently closed for the day)  Please call with questions.   Baldwin Jamaica, PA-C 03/06/2018  I have seen and examined this patient with Tommye Standard.  Agree with above, note added to reflect my findings.  On exam, RRR, no murmurs, lungs clear.  Patient presented to the hospital with cryptogenic stroke. To date, no cause has been found. TEE planned for today. If unrevealing,  plan for LINQ monitor to look for atrial fibrillation. Risks and benefits discussed. Risks include but not limited to bleeding and infection. The patient understands the risks and has agreed to the procedure.   M.  MD 03/06/2018 4:36 PM

## 2018-03-06 NOTE — Progress Notes (Signed)
Spoke with RN Levada Dy and RN stated that at this time the patient does not have any IV medications ordered and would like to hold off on starting another PIV at this time. RN made aware if anything changes please replace consult and IV team will return

## 2018-03-06 NOTE — CV Procedure (Signed)
     Transesophageal Echocardiogram Note  Theresa Ray [id number] 24-Jan-1946  Procedure: Transesophageal Echocardiogram Indications: stroke  Procedure Details Consent: Obtained Time Out: Verified patient identification, verified procedure, site/side was marked, verified correct patient position, special equipment/implants available, Radiology Safety Procedures followed,  medications/allergies/relevent history reviewed, required imaging and test results available.  Performed  Medications: During this procedure the patient is administered iv Propofol for sedation by anesthesia staff.  The patient's heart rate, blood pressure, and oxygen saturation are monitored continuously during the procedure. The period of conscious sedation is 30 minutes, of which I was present face-to-face 100% of this time.  No intracardiac source of embolism, negative bubble study, no PFO or ASD.  Complications: No apparent complications Patient did tolerate procedure well.  Ena Dawley, MD, [facility] 03/06/2018, 10:54 AM

## 2018-03-06 NOTE — PMR Pre-admission (Signed)
PMR Admission Coordinator Pre-Admission Assessment  Patient: Theresa Ray is an 72 y.o., female MRN: [id number] DOB: 02/11/1946 Height: 5\' 5"  (165.1 cm) Weight: 88.6 kg              Insurance Information HMO:     PPO: Yes     PCP:      IPA:      80/20:      OTHER:  PRIMARY: [employer] Medicare      Policy#: [id number]      Subscriber: patient CM Name: Beckie Busing      Phone#: [phone]     Fax#: [phone] Pre-Cert#: [id number]      Employer:  Josem Kaufmann [phone] by Beckie Busing on 03/09/18 for admit to CIR 03/09/18. Pt is approved 12/9-12/15, with next review date 12/16. Updates are due to [facility] via fax.  Benefits:  Phone #: NA     Name: [employer] online portal Eff. Date: 04/01/17-03-31-18   Deduct: $0      Out of Pocket Max: $4,200 ($956.50)      Life Max:  CIR: $250/day, up to $1,500/admission (6 days)      SNF: $0/day for days 1-20, $164/day, for days 21-100 (100 day limit) Outpatient: per necessity     Co-Pay: $40/visit Home Health: 100%, per necessity      Co-Pay:  DME: 80%     Co-Pay: 20% Providers:  SECONDARY:      Policy#:       Subscriber:  CM Name:       Phone#:      Fax#:  Pre-Cert#:       Employer:  Benefits:  Phone #:      Name:  Eff. Date:      Deduct:       Out of Pocket Max:       Life Max:  CIR:       SNF:  Outpatient:      Co-Pay:  Home Health:       Co-Pay:  DME:      Co-Pay:   Medicaid Application Date:       Case Manager:  Disability Application Date:       Case Worker:   Emergency Contact Information Contact Information    Name Relation Home Work [place]  [phone]       Current Medical History  Patient Admitting Diagnosis: Bilateral infarcts History of Present Illness: Theresa Ray is a 72 year old right-handed female with history of CAD/CABG 2018 maintained on aspirin and Plavix, chronic diastolic congestive heart failure, diabetes mellitus, orthostatic hypotension. Per chart review, patient and husband, patient lives with  spouse. Independent prior to admission. One level home with 6-9 to entry. Husband works during the day. Presented 03/02/2018 with altered mental status and left side weakness to [place] showed no large vessel occlusion. Patient did not receive TPA. Noted leukocytosis as well as creatinine 1.87 with CT of abdomen being completed showing left urteric obstruction secondary to kidney stone was taken to the OR for a double-J stent. She was then transferred to [facility] for further evaluation of left-sided weakness. MRI reviewed, showing bilateral infarcts. Per report, CT/MRI follow-up while at [facility] showed multifocal acute bilateral small infarct spanning multiple vascular territories. 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 Echocardiogram with ejection fraction of 55%. Systolic function was normal. TEE with no intracardiac source of emboli, negative bubble study without PFO and  loop recorder placement 03/06/2018 . Follow-up urology services after recent placement of stent for kidney stone Dr.Dahlstedt and continue plan of care with follow-up urology services at [facility] did spike a low-grade fever blood cultures no growth to date, lactic acid 4.2 down to 2.0 and placed on Rocephin empirically changed Omnicef x four days..Leukocytosis improving from 22,600-12,000. Presently remains on aspirin and Plavix for CVA prophylaxis.Therapy evaluations completed with recommendations of physical medicine rehabilitation consult. Patient is to be admitted for a comprehensive rehabilitation program on 03/09/18.    :Complete NIHSS TOTAL: 5    Past Medical History  Past Medical History:  Diagnosis Date  . CAD in native artery    a. s/p CABGx3 in 01/2017.  Marland Kitchen Chronic diastolic CHF (congestive heart failure) ([place])   . Diabetes mellitus ([place])   . Diabetic neuropathy ([place])   . History of kidney stones     history of stones  . Hypertension   . Hypothyroidism   . NSTEMI (non-ST elevated myocardial infarction) ([place])   . Orthostatic hypotension   . Postoperative atrial fibrillation ([place])    a. after CABG 01/2017.  Marland Kitchen TIA (transient ischemic attack)     Family History  family history includes CVA in her father; Heart disease (age of onset: 71) in her mother.  Prior Rehab/Hospitalizations:  Has the patient had major surgery during 100 days prior to admission? No  Current Medications   Current Facility-Administered Medications:  .  acetaminophen (TYLENOL) tablet 500 mg, 500 mg, Oral, Q6H PRN, Blount, Xenia T, NP, 500 mg at 03/09/18 1542 .  amLODipine (NORVASC) tablet 10 mg, 10 mg, Oral, Daily, Dorothy Spark, MD, 10 mg at 03/09/18 1033 .  aspirin EC tablet 81 mg, 81 mg, Oral, Daily, Dorothy Spark, MD, 81 mg at 03/09/18 1033 .  atorvastatin (LIPITOR) tablet 40 mg, 40 mg, Oral, Daily, Dorothy Spark, MD, 40 mg at 03/09/18 1033 .  carvedilol (COREG) tablet 12.5 mg, 12.5 mg, Oral, BID WC, Dorothy Spark, MD, 12.5 mg at 03/09/18 1033 .  cefdinir (OMNICEF) capsule 300 mg, 300 mg, Oral, Q12H, Adhikari, Amrit, MD, 300 mg at 03/09/18 1034 .  chlorhexidine (PERIDEX) 0.12 % solution 15 mL, 15 mL, Mouth Rinse, BID, Dorothy Spark, MD, 15 mL at 03/09/18 1034 .  citalopram (CELEXA) tablet 10 mg, 10 mg, Oral, Daily, Dorothy Spark, MD, 10 mg at 03/09/18 1033 .  clopidogrel (PLAVIX) tablet 75 mg, 75 mg, Oral, Daily, Dorothy Spark, MD, 75 mg at 03/09/18 1033 .  insulin aspart (novoLOG) injection 0-5 Units, 0-5 Units, Subcutaneous, QHS, Adhikari, Amrit, MD .  insulin aspart (novoLOG) injection 0-9 Units, 0-9 Units, Subcutaneous, TID WC, Shelly Coss, MD, 2 Units at 03/09/18 1218 .  lactose free nutrition (BOOST PLUS) liquid 237 mL, 237 mL, Oral, TID WC, Dorothy Spark, MD, 237 mL at 03/09/18 1219 .  levothyroxine (SYNTHROID, LEVOTHROID) tablet 175 mcg, 175 mcg, Oral, QAC  breakfast, Dorothy Spark, MD, 175 mcg at 03/09/18 0601 .  lisinopril (PRINIVIL,ZESTRIL) tablet 5 mg, 5 mg, Oral, Daily, Dorothy Spark, MD, 5 mg at 03/09/18 1034 .  [START ON 03/10/2018] magnesium oxide (MAG-OX) tablet 400 mg, 400 mg, Oral, BID, Adhikari, Amrit, MD .  magnesium sulfate IVPB 2 g 50 mL, 2 g, Intravenous, Once, Adhikari, Amrit, MD .  multivitamin with minerals tablet 1 tablet, 1 tablet, Oral, Daily, Dorothy Spark, MD, 1 tablet at 03/09/18 1033 .  pantoprazole (PROTONIX) EC tablet 40 mg, 40 mg,  Oral, Daily, Dorothy Spark, MD, 40 mg at 03/09/18 1034 .  potassium chloride SA (K-DUR,KLOR-CON) CR tablet 40 mEq, 40 mEq, Oral, Daily, Dorothy Spark, MD, 40 mEq at 03/09/18 1033 .  potassium chloride SA (K-DUR,KLOR-CON) CR tablet 40 mEq, 40 mEq, Oral, Once, Adhikari, Amrit, MD  Patients Current Diet:  Diet Order            Diet - low sodium heart healthy        DIET DYS 3 Room service appropriate? 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 Was able to drive PTA  Home Assistive Devices / Equipment Home Assistive Devices/Equipment: Bedside commode/3-in-1, Environmental consultant (specify type) Home Equipment: Walker - 2 wheels, Bedside commode, Hand held shower head  Prior Device Use: Indicate devices/aids used by the patient prior to current illness, exacerbation or injury? None of the above  Prior Functional Level Prior Function Level of Independence: Independent Comments: independent with  mobility and ADLs, no IADLs (some driving)   Self Care: Did the patient need help bathing, dressing, using the toilet or eating?  Independent  Indoor Mobility: Did the patient need assistance with walking from room to room (with or without device)? Independent  Stairs: Did the patient need assistance with internal or external stairs (with or without device)? Independent  Functional Cognition: Did the patient need help planning regular tasks such as shopping or remembering to take medications? Independent  Current Functional Level Cognition  Arousal/Alertness: Awake/alert Overall Cognitive Status: Impaired/Different from baseline Current Attention Level: Sustained Orientation Level: Oriented to person, Oriented to place, Disoriented to time Following Commands: Follows one step commands with increased time, Follows one step commands inconsistently Safety/Judgement: Decreased awareness of safety, Decreased awareness of deficits General Comments: multimodal cueing needed throughout. Pt demonstrated decreased attention to L side Attention: Focused Focused Attention: Impaired Focused Attention Impairment: Verbal basic, Functional basic Memory: Impaired Memory Impairment: Storage deficit Awareness: Impaired Awareness Impairment: Intellectual impairment Problem Solving: Impaired Problem Solving Impairment: Functional basic Executive Function: (all impaired due to lower level deficits) Safety/Judgment: Impaired Comments: left inattention     Extremity Assessment (includes Sensation/Coordination)  Upper Extremity Assessment: Defer to OT evaluation RUE Deficits / Details: grossly 4/5 MMT, able to use functionally given increased time to process   LUE Deficits / Details: grossly 3-5 MMT, impaired coordination and functional use, L inattention   Lower Extremity Assessment: RLE deficits/detail, LLE deficits/detail RLE Deficits / Details: generalized weakness, delayed  processing/sequencing RLE Coordination: decreased gross motor LLE Deficits / Details: less initiation due to L sided neglect LLE Coordination: decreased gross motor    ADLs  Overall ADL's : Needs assistance/impaired Grooming: Wash/dry face, Minimal assistance, Bed level Upper Body Bathing: Maximal assistance, Bed level Lower Body Bathing: Total assistance, +2 for safety/equipment, +2 for physical assistance, Sit to/from stand Upper Body Dressing : Maximal assistance, Sitting Lower Body Dressing: Total assistance, +2 for physical assistance, Sit to/from stand Toilet Transfer Details (indicate cue type and reason): deferred Functional mobility during ADLs: Total assistance, +2  for physical assistance General ADL Comments: patient significantly limited by cognition, L inattention/neglect and decreased activity tolerance    Mobility  Overal bed mobility: Needs Assistance Bed Mobility: Supine to Sit Rolling: Total assist, +2 for physical assistance Sidelying to sit: Total assist, +2 for physical assistance Supine to sit: Mod assist Sit to supine: Max assist, +2 for physical assistance Sit to sidelying: Total assist, +2 for physical assistance General bed mobility comments: Pt requires VC throughout to complete supine to sit. Pt requires mod A to bring LLE EOB, and for initiation trunk stability. Pt aids using RUE on the bed rail. Pt moves at decreased speed, and increased time is needed.     Transfers  Overall transfer level: Needs assistance Equipment used: None, Rolling walker (2 wheeled) Transfers: Sit to/from Stand, [employer] Transfers Sit to Stand: Min assist, Mod assist Stand pivot transfers: Min assist, +2 safety/equipment General transfer comment: Pt able to complete sit to stand 2 times. Pt initially requires mod A for power initiation, and stability without the use of the walker. Pt then completed a sit to stand with min A and the use of a RW, with verbal cueing given for BUE  placement. Throughout, pt was given VC for upright posture, and maintaining vertical gaze stabilization. Min A x2 for safety was given during stand pivot without the use of a RW.      Ambulation / Gait / Stairs / Wheelchair Mobility  Ambulation/Gait Ambulation/Gait assistance: Min guard, +2 safety/equipment(chair follow) Gait Distance (Feet): 40 Feet(20'x2) Assistive device: Rolling walker (2 wheeled) Gait Pattern/deviations: Step-through pattern, Decreased stride length, Decreased dorsiflexion - left, Decreased weight shift to left General Gait Details: Pt ambulated 40` and required a rest break halfway through. Pt requires VC throughout to maintain body positioning inside the RW, and for erect standing posture. Pt required rest break after stating the need to sit once entering into the hallway. Pt was then able to ambulate back into the room. 2 person needed for chair follow secondary to decreased ambulation endurance and safety with AD.  Gait velocity: slow Gait velocity interpretation: <1.8 ft/sec, indicate of risk for recurrent falls    Posture / Balance Dynamic Sitting Balance Sitting balance - Comments: Pt able to complete period of sitting balance with no UE support, but unable to accept challenge. Pt demonstrates posterior lean once fatigued, but shows ability to correct with VC.  Balance Overall balance assessment: Needs assistance Sitting-balance support: Single extremity supported, Feet supported Sitting balance-Leahy Scale: Fair Sitting balance - Comments: Pt able to complete period of sitting balance with no UE support, but unable to accept challenge. Pt demonstrates posterior lean once fatigued, but shows ability to correct with VC.  Postural control: Posterior lean Standing balance support: Bilateral upper extremity supported, During functional activity Standing balance-Leahy Scale: Poor Standing balance comment: Pt relies on BUE support during ambulation. Pt able to tolerate  standing balance with LUE support and min A.      Special needs/care consideration BiPAP/CPAP: has history of use of CPAP, however according to husband, she was taken off CPAP a few years ago.  CPM: no Continuous Drip IV: Magnesium sulfate IVPB  Dialysis: no        Days: no Life Vest: no Oxygen: on RA Special Bed: no Trach Size: no Wound Vac (area): no      Location: no Skin: incision to vagina  Bowel mgmt: continent, last BM 03/04/18 Bladder mgmt: catheter in place; incontinence.  Diabetic mgmt: yes     Previous Home Environment Living Arrangements: Spouse/significant other Available Help at Discharge: Family, Available 24 hours/day Type of Home: House Home Layout: One level Home Access: Stairs to enter Entrance Stairs-Rails: Can reach both Entrance Stairs-Number of Steps: 6-9 Bathroom Shower/Tub: Chiropodist: [place]: No  Discharge Living Setting Plans for Discharge Living Setting: Patient's home Type of Home at Discharge: House Discharge Home Layout: One level Discharge Home Access: Stairs to enter Entrance Stairs-Rails: Can reach both Entrance Stairs-Number of Steps: 8-9 steps Discharge Bathroom Shower/Tub: Tub/shower unit Discharge Bathroom Toilet: Standard(but has BSC at home that can elevate commode as needed) Discharge Bathroom Accessibility: Yes How Accessible: Accessible via walker Does the patient have any problems obtaining your medications?: No  Social/Family/Support Systems Patient Roles: Spouse Contact Information: husband Azucena Fallen): [phone] Anticipated Caregiver: husband; per husband, his sister and borhter can sit in with her and look in on her PRN if needed Anticipated Caregiver's Contact Information: husband Mallie Mussel): [phone] Ability/Limitations of Caregiver: Min A Caregiver Availability: Other (Comment)(husband not working til 1st of year, will re-eval assist) Discharge  Plan Discussed with Primary Caregiver: Yes Is Caregiver In Agreement with Plan?: Yes Does Caregiver/Family have Issues with Lodging/Transportation while Pt is in Rehab?: No   Goals/Additional Needs Patient/Family Goal for Rehab: PT/OT: Supervision; SLP: Supervision/Mod I Expected length of stay: 15-18 days Cultural Considerations: NA Dietary Needs: DYS 3, thin liquids.  Equipment Needs: TBD Special Service Needs: may benefit from referral to neuropsych Pt/Family Agrees to Admission and willing to participate: Yes Program Orientation Provided & Reviewed with Pt/Caregiver Including Roles  & Responsibilities: Yes(with pt and husband)  Barriers to Discharge: Inaccessible home environment, Home environment access/layout, Incontinence, New oxygen  Barriers to Discharge Comments: new to O2, unsure if will need at home; steps to enter; husband may need to return to work    Decrease burden of Care through IP rehab admission: NA   Possible need for SNF placement upon discharge: Not anticipated; pt has supportive husband and has good prognosis for further progress through CIR.    Patient Condition: This patient's medical and functional status has changed since the consult dated: 03/04/18 in which the Rehabilitation Physician determined and documented that the patient's condition is appropriate for intensive rehabilitative care in an inpatient rehabilitation facility. See "History of Present Illness" (above) for medical update. Functional changes are: improvement in sit to stand from Mod A x2 to Min/Mod A, improvement in gait from Mod Ax2 150 feet to Min G x2 40 feet. Patient's medical and functional status update has been discussed with the Rehabilitation physician and patient remains appropriate for inpatient rehabilitation. Will admit to inpatient rehab today.  Preadmission Screen Completed By:  Jhonnie Garner, 03/09/2018 4:13 PM ______________________________________________________________________    Discussed status with Dr. Posey Pronto on 4:13Pm at 03/09/18 and received telephone approval for admission today.  Admission Coordinator:  Jhonnie Garner, time 4:14PM/Date 03/09/18.

## 2018-03-06 NOTE — Progress Notes (Signed)
PT Cancellation Note  Patient Details Name: Theresa Ray MRN: [id number] DOB: 1945-05-11   Cancelled Treatment:    Reason Eval/Treat Not Completed: Patient at procedure or test/unavailable. Pt off of the floor for a TEE. PT will continue to follow up acutely as available.    [place] 03/06/2018, 10:49 AM

## 2018-03-06 NOTE — Progress Notes (Signed)
SLP Cancellation Note  Patient Details Name: Theresa Ray MRN: [id number] DOB: 07-18-1945   Cancelled treatment:       Reason Eval/Treat Not Completed: Patient at procedure or test/unavailable. Pt NPo for procedure   Vineeth Fell, Linell Meldrum 03/06/2018, 7:53 AM

## 2018-03-06 NOTE — Progress Notes (Signed)
PROGRESS NOTE    Theresa Ray  KDT:[id number] DOB: [birth year] DOA: 03/02/2018 PCP: Ronita Hipps, MD   Brief Narrative: Patient is a 72 year old female with past medical history of chronic diastolic CHF, TIAs, diabetes, hypertension, CAD status post CABG x3, postoperative A. fib who was transferred from [facility].  She initially presented to [facility] with altered mental status, left-sided weakness, severe abdominal pain.  Further work-up revealed left ureteral obstruction due to calculus.  Urology was consulted and she underwent double-J stent placement there.  She was transferred to here under ICU service for altered mental status.  MRI showed small multifocal infarcts concerning for embolic stroke.  Neurology, cardiology following.  Currently she is being treated for urosepsis secondary to pyelonephritis secondary to left UPJ obstruction.  Plan for loop recorder placement today by EP. Underwent TEE today also. Patient transferred to hospitalist service on 03/05/18. 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   Blood cultures negative so far. Currently she is hemodynamically stable.No fever.Urine culture did not show any growth.  Changed antibiotics to oral.  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  She was found to have 6 mm left upper ureteral stone with hydronephrosis and underwent stent placement.  Acute CVA: MRI showed multifocal infarcts.  Suggestive of embolic process.  Plan is to rule  out endocarditis versus atrial fibrillation.  TTE showed mild reduction in EF to 50 to 55%, hypokinesis of the inferior lateral and inferior myocardium, mild to moderate  MR.  TEE did not show any intracardiac source of emboli, negative bubble study, no PFO for ASD.  Neurology following.  She is planned for  loop recorder placement to assess for occult A. Fib today. Currently she is on aspirin, Plavix and statin. She has  weakness on the left side. EEG did not show any seizure activity. Imagings:MRI: Acute right MCA and punctate left MCA infarcts, chronic bilateral BG infarcts. CT head and neck: no LVO, moderate stenosis right V4, left A2, bilateral M2/M3 and left P2  Occult Afib?:She also has history of postop A. fib after bypass surgery November 2018.  She was also on amiodarone for a while.  30-day event monitoring on October 2019 did not show any atrial fibrillation.  No A. fib noted on telemetry so far. Plan for loop recorder placement today.  She follows with Dr. Angelena Form as outpatient  History of chronic diastolic CHF: Echocardiogram done here showed mildy  reduced ejection fraction.  Chest x-ray yesterday showed possible pulmonary edema.  Ordered a dose of Lasix IV 60 mg which much improvement in the respiratory status today.  Off oxygen.  Hypertension: Systolic hypertension.  Started on amlodipine.  Also on lisinopril and carvedilol.  History of coronary disease: Status post CABG in November 2018.  Continue aspirin, Plavix, statin and beta-blocker.  AKI: Creatinine at 1.6  today.  We will continue to monitor BMP.  Diabetes type 2: Hemoglobin A1c of 6.  Continue sliding-scale insulin  Hyperlipidemia: LDL of 99.  Continue statin 40 mg daily.  Hypokalemia: Supplemented with potassium.  Deconditioning/debility/disposition: CIR following.  Most likely she will be discharged to CIR after full work-up.     Nutrition  Problem: Inadequate oral intake Etiology: nausea, vomiting      DVT prophylaxis: SCD Code Status: Full Family Communication: Husband present at the bedside Disposition Plan: CIR after full work-up   Consultants: Cardiology,  neurology, PCCM  Procedures: MRI  Antimicrobials: Omnicef  Subjective:  Patient seen and examined the bedside this morning.  Looks much better today.  More alert and oriented.  Communicating.  Off oxygen.  Objective: Vitals:   03/06/18 1100 03/06/18 1105 03/06/18 1110 03/06/18 1212  BP:  (!) 146/69  (!) 150/81  Pulse:  64 64 70  Resp:  (!) 23 (!) 22 18  Temp:      TempSrc:      SpO2: 94% 93% 93% 96%  Weight:      Height:        Intake/Output Summary (Last 24 hours) at 03/06/2018 1259 Last data filed at 03/06/2018 1051 Gross per 24 hour  Intake 498.37 ml  Output 3700 ml  Net -3201.63 ml   Filed Weights   03/02/18 2135 03/03/18 0500  Weight: 85.5 kg 85.6 kg    Examination:  General exam: Not in distress,obese HEENT:PERRL,Oral mucosa moist, Ear/Nose normal on gross exam Respiratory system: Decreased air entry in the bases but mostly clear Cardiovascular system: S1 & S2 heard, RRR. No JVD, murmurs, rubs, gallops or clicks. No pedal edema. Gastrointestinal system: Abdomen is nondistended, soft and nontender. No organomegaly or masses felt. Normal bowel sounds heard. Central nervous system: Alert and oriented. Power 4/5 on the right upper and lower extremity, 3 /5 on left upper and lower extremities.   Extremities: No edema, no clubbing ,no cyanosis, distal peripheral pulses palpable. Skin: No rashes, lesions or ulcers,no icterus ,no pallor GU: Foley.     Data Reviewed: I have personally reviewed following labs and imaging studies  CBC: Recent Labs  Lab 03/02/18 2302 03/03/18 0154 03/04/18 0725 03/05/18 0403 03/06/18 0441  WBC 22.6* 19.3* 16.3* 16.0* 12.0*  NEUTROABS 20.1*  --   --   --   --   HGB 13.9 13.2 11.6* 11.0* 11.4*  HCT 41.5 39.2 34.0* 32.9* 33.8*  MCV 90.8 90.3 91.4 90.9 90.4  PLT 352 327 261 325 025   Basic Metabolic Panel: Recent Labs  Lab 03/03/18 0154 03/03/18 1154 03/04/18 0725 03/05/18 0403 03/06/18 0441  NA 134* 135 139 134* 139  K  3.1* 3.1* 3.3* 3.1* 3.2*  CL 96* 101 105 100 103  CO2 21* 22 20* 20* 23  GLUCOSE 210* 190* 144* 152* 137*  BUN 23 24* 21 24* 24*  CREATININE 1.79* 1.56* 1.37* 1.44* 1.62*  CALCIUM 8.4* 8.1* 8.2* 8.2* 8.4*   GFR: Estimated Creatinine Clearance: 35.4 mL/min (A) (by C-G formula based on SCr of 1.62 mg/dL (H)). Liver Function Tests: Recent Labs  Lab 03/02/18 2302  AST 32  ALT 19  ALKPHOS 41  BILITOT 0.9  PROT 6.9  ALBUMIN 3.3*   No results for input(s): LIPASE, AMYLASE in the last 168 hours. No results for input(s): AMMONIA in the last 168 hours. Coagulation Profile: No results for input(s): INR, PROTIME in the last 168 hours. Cardiac Enzymes: No results for input(s): CKTOTAL, CKMB, CKMBINDEX, TROPONINI in the last 168 hours. BNP (last 3 results) No results for input(s): PROBNP in the last 8760 hours. HbA1C: No results for input(s): HGBA1C in the last 72 hours. CBG: Recent Labs  Lab 03/05/18 1636 03/05/18 2038 03/06/18 0058 03/06/18 0431 03/06/18 0736  GLUCAP 167* 157* 122* 128* 153*   Lipid Profile: No  results for input(s): CHOL, HDL, LDLCALC, TRIG, CHOLHDL, LDLDIRECT in the last 72 hours. Thyroid Function Tests: No results for input(s): TSH, T4TOTAL, FREET4, T3FREE, THYROIDAB in the last 72 hours. Anemia Panel: No results for input(s): VITAMINB12, FOLATE, FERRITIN, TIBC, IRON, RETICCTPCT in the last 72 hours. Sepsis Labs: Recent Labs  Lab 03/03/18 0154 03/03/18 1154 03/03/18 1347 03/04/18 0725  LATICACIDVEN 4.2* 1.8 2.0* 1.1    Recent Results (from the past 240 hour(s))  MRSA PCR Screening     Status: None   Collection Time: 03/02/18  9:53 PM  Result Value Ref Range Status   MRSA by PCR NEGATIVE NEGATIVE Final    Comment:        The GeneXpert MRSA Assay (FDA approved for NASAL specimens only), is one component of a comprehensive MRSA colonization surveillance program. It is not intended to diagnose MRSA infection nor to guide or monitor treatment  for MRSA infections. Performed at [facility], [place] [address] [place], [place] [address]   Culture, blood (Routine X 2) w Reflex to ID Panel     Status: None (Preliminary result)   Collection Time: 03/03/18 10:46 AM  Result Value Ref Range Status   Specimen Description BLOOD LEFT HAND  Final   Special Requests   Final    BOTTLES DRAWN AEROBIC ONLY Blood Culture adequate volume   Culture   Final    NO GROWTH 3 DAYS Performed at [facility], [place] [address] [place], [place] [address]    Report Status PENDING  Incomplete  Culture, blood (Routine X 2) w Reflex to ID Panel     Status: None (Preliminary result)   Collection Time: 03/03/18 10:46 AM  Result Value Ref Range Status   Specimen Description BLOOD RIGHT HAND  Final   Special Requests   Final    BOTTLES DRAWN AEROBIC ONLY Blood Culture adequate volume   Culture   Final    NO GROWTH 3 DAYS Performed at [facility], [place] [address], [place], [place] [address]    Report Status PENDING  Incomplete  Culture, Urine     Status: None   Collection Time: 03/05/18 11:55 AM  Result Value Ref Range Status   Specimen Description URINE, CATHETERIZED  Final   Special Requests NONE  Final   Culture   Final    NO GROWTH Performed at [facility], [address], [place], [place] [address]    Report Status 03/06/2018 FINAL  Final         Radiology Studies: Dg Chest 1 View  Result Date: 03/05/2018 CLINICAL DATA:  Shortness of Breath EXAM: CHEST  1 VIEW COMPARISON:  03/02/2018 FINDINGS: Cardiac shadow is stable. Increased vascular congestion is noted with patchy bibasilar infiltrates and mild interstitial edema. No sizable effusion is seen. No bony abnormality is noted. IMPRESSION: Changes of mild CHF with bibasilar infiltrates identified. Electronically Signed   By: Inez Catalina M.D.   On: 03/05/2018 14:38        Scheduled Meds: . amLODipine  10 mg Oral Daily  . aspirin EC  81 mg Oral Daily  .  atorvastatin  40 mg Oral Daily  . carvedilol  12.5 mg Oral BID WC  . chlorhexidine  15 mL Mouth Rinse BID  . citalopram  10 mg Oral Daily  . clopidogrel  75 mg Oral Daily  . insulin aspart  0-15 Units Subcutaneous Q4H  . lactose free nutrition  237 mL Oral TID WC  .  levothyroxine  175 mcg Oral QAC breakfast  . lisinopril  5 mg Oral Daily  . multivitamin with minerals  1 tablet Oral Daily  . pantoprazole  40 mg Oral Daily  . potassium chloride  40 mEq Oral Daily  . potassium chloride  40 mEq Oral Once   Continuous Infusions: . sodium chloride Stopped (03/03/18 1712)     LOS: 4 days    Time spent: 25 mins.More than 50% of that time was spent in counseling and/or coordination of care.      Shelly Coss, MD [facility] Pager [phone]  If 7PM-7AM, please contact night-coverage [email or web address] Password [email or web address] 03/06/2018, 12:59 PM

## 2018-03-06 NOTE — Anesthesia Procedure Notes (Signed)
Procedure Name: MAC Date/Time: 03/06/2018 10:29 AM Performed by: Leonor Liv, CRNA Oxygen Delivery Method: Nasal cannula Placement Confirmation: positive ETCO2

## 2018-03-06 NOTE — Interval H&P Note (Signed)
History and Physical Interval Note:  03/06/2018 8:14 AM  Theresa Ray  has presented today for surgery, with the diagnosis of STROKE  The various methods of treatment have been discussed with the patient and family. After consideration of risks, benefits and other options for treatment, the patient has consented to  Procedure(s) with comments: TRANSESOPHAGEAL ECHOCARDIOGRAM (TEE) (N/A) - loop as a surgical intervention .  The patient's history has been reviewed, patient examined, no change in status, stable for surgery.  I have reviewed the patient's chart and labs.  Questions were answered to the patient's satisfaction.     Ena Dawley

## 2018-03-06 NOTE — Discharge Instructions (Signed)
Implant site care °Keep incision clean and dry for 3 days.  °You can remove outer dressing tomorrow. °Leave steri-strips (little pieces of tape) on until seen in the office for wound check appointment. °Call the office ([phone]) for redness, drainage, swelling, or fever. ° °

## 2018-03-06 NOTE — Transfer of Care (Signed)
Immediate Anesthesia Transfer of Care Note  Patient: Theresa Ray  Procedure(s) Performed: TRANSESOPHAGEAL ECHOCARDIOGRAM (TEE) (N/A ) BUBBLE STUDY  Patient Location: Endoscopy Unit  Anesthesia Type:MAC  Level of Consciousness: sedated  Airway & Oxygen Therapy: Patient Spontanous Breathing and Patient connected to nasal cannula oxygen  Post-op Assessment: Report given to RN and Post -op Vital signs reviewed and stable  Post vital signs: Reviewed and stable  Last Vitals:  Vitals Value Taken Time  BP 140/63 03/06/2018 10:56 AM  Temp    Pulse 61 03/06/2018 10:57 AM  Resp 24 03/06/2018 10:57 AM  SpO2 91 % 03/06/2018 10:57 AM  Vitals shown include unvalidated device data.  Last Pain:  Vitals:   03/06/18 0949  TempSrc: Oral  PainSc: 0-No pain         Complications: No apparent anesthesia complications. IV site appears infiltrated in recovery-RN will remove 22g and replace with new site.

## 2018-03-07 LAB — GLUCOSE, CAPILLARY
GLUCOSE-CAPILLARY: 207 mg/dL — AB (ref 70–99)
Glucose-Capillary: 114 mg/dL — ABNORMAL HIGH (ref 70–99)
Glucose-Capillary: 118 mg/dL — ABNORMAL HIGH (ref 70–99)
Glucose-Capillary: 131 mg/dL — ABNORMAL HIGH (ref 70–99)
Glucose-Capillary: 148 mg/dL — ABNORMAL HIGH (ref 70–99)
Glucose-Capillary: 197 mg/dL — ABNORMAL HIGH (ref 70–99)
Glucose-Capillary: 251 mg/dL — ABNORMAL HIGH (ref 70–99)

## 2018-03-07 LAB — CBC
HCT: 33.5 % — ABNORMAL LOW (ref 36.0–46.0)
Hemoglobin: 11.6 g/dL — ABNORMAL LOW (ref 12.0–15.0)
MCH: 30.9 pg (ref 26.0–34.0)
MCHC: 34.6 g/dL (ref 30.0–36.0)
MCV: 89.1 fL (ref 80.0–100.0)
Platelets: 344 10*3/uL (ref 150–400)
RBC: 3.76 MIL/uL — ABNORMAL LOW (ref 3.87–5.11)
RDW: 12.1 % (ref 11.5–15.5)
WBC: 12.3 10*3/uL — ABNORMAL HIGH (ref 4.0–10.5)
nRBC: 0 % (ref 0.0–0.2)

## 2018-03-07 LAB — BASIC METABOLIC PANEL
Anion gap: 14 (ref 5–15)
BUN: 23 mg/dL (ref 8–23)
CO2: 21 mmol/L — ABNORMAL LOW (ref 22–32)
Calcium: 8.2 mg/dL — ABNORMAL LOW (ref 8.9–10.3)
Chloride: 101 mmol/L (ref 98–111)
Creatinine, Ser: 1.46 mg/dL — ABNORMAL HIGH (ref 0.44–1.00)
GFR calc Af Amer: 42 mL/min — ABNORMAL LOW (ref >60)
GFR calc non Af Amer: 36 mL/min — ABNORMAL LOW (ref >60)
Glucose, Bld: 114 mg/dL — ABNORMAL HIGH (ref 70–99)
Potassium: 3.7 mmol/L (ref 3.5–5.1)
Sodium: 136 mmol/L (ref 135–145)

## 2018-03-07 NOTE — Progress Notes (Signed)
PROGRESS NOTE    Theresa Ray  BUL:[id number] DOB: [birth year] DOA: 03/02/2018 PCP: Ronita Hipps, MD   Brief Narrative: Patient is a 72 year old female with past medical history of chronic diastolic CHF, TIAs, diabetes, hypertension, CAD status post CABG x3, postoperative A. fib who was transferred from [facility].  She initially presented to [facility] with altered mental status, left-sided weakness, severe abdominal pain.  Further work-up revealed left ureteral obstruction due to calculus.  Urology was consulted and she underwent double-J stent placement there.  She was transferred to here under ICU service for altered mental status.  MRI showed small multifocal infarcts concerning for embolic stroke.  Neurology, cardiology following.  Underwent TEE and loop recorder placement. Patient transferred to hospitalist service on 03/05/18. She is waiting for bed in CIR.  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   Blood cultures negative so far. Currently she is hemodynamically stable.No fever.Urine culture did not show any growth.  Changed antibiotics to oral.  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  She was found to have 6 mm left upper ureteral stone with hydronephrosis and underwent stent placement.  Acute CVA: MRI showed multifocal infarcts.  Suggestive of embolic process.  Plan is to rule  out endocarditis versus atrial fibrillation.  TTE showed mild reduction in EF to 50 to 55%, hypokinesis of the inferior lateral and inferior myocardium, mild to moderate MR.  TEE did not show any intracardiac source of emboli, negative bubble study, no PFO for ASD.   Neurology following.  She underwent  loop recorder placement to assess for occult A. Fib on 03/06/18 by EP. Currently she is on aspirin, Plavix and statin. She has  weakness on the left side. EEG did not show any seizure activity. Imagings:MRI: Acute right MCA and punctate left MCA infarcts, chronic bilateral BG infarcts. CT head and neck: no LVO, moderate stenosis right V4, left A2, bilateral M2/M3 and left P2  Occult Afib?:She also has history of postop A. fib after bypass surgery November 2018.  She was also on amiodarone for a while.  30-day event monitoring on October 2019 did not show any atrial fibrillation.  No A. fib noted on telemetry so far. Underwent  loop recorder placement .  She follows with Dr. Angelena Form as outpatient  History of chronic diastolic CHF: Echocardiogram done here showed mildy  reduced ejection fraction.  Chest x-ray yesterday showed possible pulmonary edema.  Ordered a dose of Lasix IV 60 mg on 03/05/18  which much improvement in the respiratory status today.  Off oxygen.  Hypertension: Systolic hypertension.  Started on amlodipine.  Also on lisinopril and carvedilol.  History of coronary disease: Status post CABG in November 2018.  Continue aspirin, Plavix, statin and beta-blocker.  AKI: Creatinine at 1.46  today.  We will continue to monitor BMP.  Diabetes type 2: Hemoglobin A1c of 6.  Continue sliding-scale insulin  Hyperlipidemia: LDL of 99.  Continue statin 40 mg daily.  Hypokalemia: Supplemented with potassium.  Deconditioning/debility/disposition: CIR following.  Discharge planning to CIR as soon as bed is available.    Nutrition Problem: Inadequate oral intake Etiology: nausea, vomiting  DVT prophylaxis: SCD Code Status: Full Family Communication: Husband present at the bedside Disposition Plan: CIR as soon as bed is available   Consultants: Cardiology, neurology, PCCM  Procedures: MRI  Antimicrobials: Omnicef  Subjective:  Patient  seen and examined the bedside this morning.  Looks much better today.  More alert and oriented.  Communicating.  Off oxygen.  Objective: Vitals:   03/06/18 1655 03/06/18 2218 03/07/18 0500 03/07/18 0817  BP: (!) 144/71 (!) 145/72  (!) 187/71  Pulse: 81   77  Resp:  20  15  Temp: 98.8 F (37.1 C) 98.6 F (37 C)    TempSrc: Oral Axillary    SpO2: 93% 95%  98%  Weight:   89.3 kg   Height:        Intake/Output Summary (Last 24 hours) at 03/07/2018 1056 Last data filed at 03/07/2018 0800 Gross per 24 hour  Intake 360 ml  Output 1725 ml  Net -1365 ml   Filed Weights   03/02/18 2135 03/03/18 0500 03/07/18 0500  Weight: 85.5 kg 85.6 kg 89.3 kg    Examination:  General exam: Not in distress,obese HEENT:PERRL,Oral mucosa moist, Ear/Nose normal on gross exam Respiratory system: Decreased air entry in the bases but mostly clear Cardiovascular system: S1 & S2 heard, RRR. No JVD, murmurs, rubs, gallops or clicks. No pedal edema. Gastrointestinal system: Abdomen is nondistended, soft and nontender. No organomegaly or masses felt. Normal bowel sounds heard. Central nervous system: Alert and oriented. Power 4/5 on the right upper and lower extremity, 3 /5 on left upper and lower extremities.  Slow pseech Extremities: No edema, no clubbing ,no cyanosis, distal peripheral pulses palpable. Skin: No rashes, lesions or ulcers,no icterus ,no pallor GU: Foley.     Data Reviewed: I have personally reviewed following labs and imaging studies  CBC: Recent Labs  Lab 03/02/18 2302 03/03/18 0154 03/04/18 0725 03/05/18 0403 03/06/18 0441 03/07/18 0610  WBC 22.6* 19.3* 16.3* 16.0* 12.0* 12.3*  NEUTROABS 20.1*  --   --   --   --   --   HGB 13.9 13.2 11.6* 11.0* 11.4* 11.6*  HCT 41.5 39.2 34.0* 32.9* 33.8* 33.5*  MCV 90.8 90.3 91.4 90.9 90.4 89.1  PLT 352 327 261 325 345 076   Basic Metabolic Panel: Recent Labs  Lab 03/03/18 1154 03/04/18 0725 03/05/18 0403 03/06/18 0441 03/07/18 0519   NA 135 139 134* 139 136  K 3.1* 3.3* 3.1* 3.2* 3.7  CL 101 105 100 103 101  CO2 22 20* 20* 23 21*  GLUCOSE 190* 144* 152* 137* 114*  BUN 24* 21 24* 24* 23  CREATININE 1.56* 1.37* 1.44* 1.62* 1.46*  CALCIUM 8.1* 8.2* 8.2* 8.4* 8.2*   GFR: Estimated Creatinine Clearance: 40.1 mL/min (A) (by C-G formula based on SCr of 1.46 mg/dL (H)). Liver Function Tests: Recent Labs  Lab 03/02/18 2302  AST 32  ALT 19  ALKPHOS 41  BILITOT 0.9  PROT 6.9  ALBUMIN 3.3*   No results for input(s): LIPASE, AMYLASE in the last 168 hours. No results for input(s): AMMONIA in the last 168 hours. Coagulation Profile: No results for input(s): INR, PROTIME in the last 168 hours. Cardiac Enzymes: No results for input(s): CKTOTAL, CKMB, CKMBINDEX, TROPONINI in the last 168 hours. BNP (last 3 results) No results for input(s): PROBNP in the last 8760 hours. HbA1C: No results for input(s): HGBA1C in the last 72 hours. CBG: Recent Labs  Lab 03/06/18 1213 03/06/18 2039 03/07/18 0026 03/07/18 0428 03/07/18 2263  GLUCAP 162* 202* 114* 118* 148*   Lipid Profile: No results for input(s): CHOL, HDL, LDLCALC, TRIG, CHOLHDL, LDLDIRECT in the last 72 hours. Thyroid Function Tests: No results for input(s): TSH, T4TOTAL, FREET4, T3FREE, THYROIDAB in the last 72 hours. Anemia Panel: No results for input(s): VITAMINB12, FOLATE, FERRITIN, TIBC, IRON, RETICCTPCT in the last 72 hours. Sepsis Labs: Recent Labs  Lab 03/03/18 0154 03/03/18 1154 03/03/18 1347 03/04/18 0725  LATICACIDVEN 4.2* 1.8 2.0* 1.1    Recent Results (from the past 240 hour(s))  MRSA PCR Screening     Status: None   Collection Time: 03/02/18  9:53 PM  Result Value Ref Range Status   MRSA by PCR NEGATIVE NEGATIVE Final    Comment:        The GeneXpert MRSA Assay (FDA approved for NASAL specimens only), is one component of a comprehensive MRSA colonization surveillance program. It is not intended to diagnose MRSA infection nor to  guide or monitor treatment for MRSA infections. Performed at [facility], [place] [address] [place], [place] [address]   Culture, blood (Routine X 2) w Reflex to ID Panel     Status: None (Preliminary result)   Collection Time: 03/03/18 10:46 AM  Result Value Ref Range Status   Specimen Description BLOOD LEFT HAND  Final   Special Requests   Final    BOTTLES DRAWN AEROBIC ONLY Blood Culture adequate volume   Culture   Final    NO GROWTH 3 DAYS Performed at [facility], [place] [address], [place], [place] [address]    Report Status PENDING  Incomplete  Culture, blood (Routine X 2) w Reflex to ID Panel     Status: None (Preliminary result)   Collection Time: 03/03/18 10:46 AM  Result Value Ref Range Status   Specimen Description BLOOD RIGHT HAND  Final   Special Requests   Final    BOTTLES DRAWN AEROBIC ONLY Blood Culture adequate volume   Culture   Final    NO GROWTH 3 DAYS Performed at [facility], [place] [address], [place], [place] [address]    Report Status PENDING  Incomplete  Culture, Urine     Status: None   Collection Time: 03/05/18 11:55 AM  Result Value Ref Range Status   Specimen Description URINE, CATHETERIZED  Final   Special Requests NONE  Final   Culture   Final    NO GROWTH Performed at [facility], [address] [place], [place] [address]    Report Status 03/06/2018 FINAL  Final         Radiology Studies: Dg Chest 1 View  Result Date: 03/05/2018 CLINICAL DATA:  Shortness of Breath EXAM: CHEST  1 VIEW COMPARISON:  03/02/2018 FINDINGS: Cardiac shadow is stable. Increased vascular congestion is noted with patchy bibasilar infiltrates and mild interstitial edema. No sizable effusion is seen. No bony abnormality is noted. IMPRESSION: Changes of mild CHF with bibasilar infiltrates identified. Electronically Signed   By: Inez Catalina M.D.   On: 03/05/2018 14:38        Scheduled Meds: . amLODipine  10 mg Oral Daily  . aspirin EC  81  mg Oral Daily  . atorvastatin  40 mg Oral Daily  . carvedilol  12.5 mg Oral BID WC  . cefdinir  300 mg Oral Q12H  . chlorhexidine  15 mL Mouth Rinse BID  . citalopram  10 mg Oral Daily  . clopidogrel  75 mg Oral Daily  .  insulin aspart  0-15 Units Subcutaneous Q4H  . lactose free nutrition  237 mL Oral TID WC  . levothyroxine  175 mcg Oral QAC breakfast  . lisinopril  5 mg Oral Daily  . multivitamin with minerals  1 tablet Oral Daily  . pantoprazole  40 mg Oral Daily  . potassium chloride  40 mEq Oral Daily  . potassium chloride  40 mEq Oral Once   Continuous Infusions:    LOS: 5 days    Time spent: 25 mins.More than 50% of that time was spent in counseling and/or coordination of care.      Shelly Coss, MD [facility] Pager [phone]  If 7PM-7AM, please contact night-coverage [email or web address] Password [email or web address] 03/07/2018, 10:56 AM

## 2018-03-07 NOTE — Plan of Care (Signed)
  Problem: Spiritual Needs Goal: Ability to function at adequate level Outcome: Completed/Met   Problem: Education: Goal: Knowledge of General Education information will improve Description Including pain rating scale, medication(s)/side effects and non-pharmacologic comfort measures Outcome: Completed/Met   Problem: Health Behavior/Discharge Planning: Goal: Ability to manage health-related needs will improve Outcome: Completed/Met   Problem: Clinical Measurements: Goal: Ability to maintain clinical measurements within normal limits will improve Outcome: Completed/Met Goal: Will remain free from infection Outcome: Completed/Met Goal: Diagnostic test results will improve Outcome: Completed/Met Goal: Respiratory complications will improve Outcome: Completed/Met Goal: Cardiovascular complication will be avoided Outcome: Completed/Met   Problem: Activity: Goal: Risk for activity intolerance will decrease Outcome: Completed/Met   Problem: Elimination: Goal: Will not experience complications related to bowel motility Outcome: Completed/Met Goal: Will not experience complications related to urinary retention Outcome: Completed/Met   Problem: Pain Managment: Goal: General experience of comfort will improve Outcome: Completed/Met   Problem: Safety: Goal: Ability to remain free from injury will improve Outcome: Completed/Met   Problem: Skin Integrity: Goal: Risk for impaired skin integrity will decrease Outcome: Completed/Met   Problem: Education: Goal: Knowledge of disease or condition will improve Outcome: Completed/Met Goal: Knowledge of secondary prevention will improve Outcome: Completed/Met Goal: Knowledge of patient specific risk factors addressed and post discharge goals established will improve Outcome: Completed/Met Goal: Individualized Educational Video(s) Outcome: Completed/Met   Problem: Coping: Goal: Will verbalize positive feelings about self Outcome:  Completed/Met Goal: Will identify appropriate support needs Outcome: Completed/Met   Problem: Health Behavior/Discharge Planning: Goal: Ability to manage health-related needs will improve Outcome: Completed/Met   Problem: Self-Care: Goal: Ability to participate in self-care as condition permits will improve Outcome: Completed/Met Goal: Verbalization of feelings and concerns over difficulty with self-care will improve Outcome: Completed/Met Goal: Ability to communicate needs accurately will improve Outcome: Completed/Met   Problem: Nutrition: Goal: Risk of aspiration will decrease Outcome: Completed/Met Goal: Dietary intake will improve Outcome: Completed/Met   Problem: Ischemic Stroke/TIA Tissue Perfusion: Goal: Complications of ischemic stroke/TIA will be minimized Outcome: Completed/Met   Problem: Fluid Volume: Goal: Hemodynamic stability will improve Outcome: Completed/Met   Problem: Clinical Measurements: Goal: Diagnostic test results will improve Outcome: Completed/Met Goal: Signs and symptoms of infection will decrease Outcome: Completed/Met   Problem: Fluid Volume: Goal: Hemodynamic stability will improve Outcome: Completed/Met   Problem: Clinical Measurements: Goal: Diagnostic test results will improve Outcome: Completed/Met Goal: Signs and symptoms of infection will decrease Outcome: Completed/Met   Problem: Respiratory: Goal: Ability to maintain adequate ventilation will improve Outcome: Completed/Met

## 2018-03-08 ENCOUNTER — Inpatient Hospital Stay (HOSPITAL_COMMUNITY): Payer: Medicare HMO

## 2018-03-08 ENCOUNTER — Inpatient Hospital Stay (HOSPITAL_COMMUNITY): Payer: Medicare HMO | Admitting: Occupational Therapy

## 2018-03-08 LAB — CULTURE, BLOOD (ROUTINE X 2)
CULTURE: NO GROWTH
Culture: NO GROWTH
Special Requests: ADEQUATE
Special Requests: ADEQUATE

## 2018-03-08 LAB — GLUCOSE, CAPILLARY
Glucose-Capillary: 174 mg/dL — ABNORMAL HIGH (ref 70–99)
Glucose-Capillary: 139 mg/dL — ABNORMAL HIGH (ref 70–99)
Glucose-Capillary: 164 mg/dL — ABNORMAL HIGH (ref 70–99)
Glucose-Capillary: 267 mg/dL — ABNORMAL HIGH (ref 70–99)
Glucose-Capillary: 174 mg/dL — ABNORMAL HIGH (ref 70–99)

## 2018-03-08 LAB — BASIC METABOLIC PANEL
Anion gap: 12 (ref 5–15)
BUN: 21 mg/dL (ref 8–23)
CO2: 23 mmol/L (ref 22–32)
Calcium: 8.3 mg/dL — ABNORMAL LOW (ref 8.9–10.3)
Chloride: 98 mmol/L (ref 98–111)
Creatinine, Ser: 1.23 mg/dL — ABNORMAL HIGH (ref 0.44–1.00)
GFR calc Af Amer: 52 mL/min — ABNORMAL LOW (ref >60)
GFR calc non Af Amer: 45 mL/min — ABNORMAL LOW (ref >60)
Glucose, Bld: 171 mg/dL — ABNORMAL HIGH (ref 70–99)
Potassium: 3.2 mmol/L — ABNORMAL LOW (ref 3.5–5.1)
Sodium: 133 mmol/L — ABNORMAL LOW (ref 135–145)

## 2018-03-08 MED ORDER — INSULIN ASPART 100 UNIT/ML ~~LOC~~ SOLN
0.0000 [IU] | Freq: Three times a day (TID) | SUBCUTANEOUS | Status: DC
  Administered 2018-03-08: 5 [IU] via SUBCUTANEOUS
  Administered 2018-03-08: 1 [IU] via SUBCUTANEOUS
  Administered 2018-03-08 – 2018-03-09 (×2): 2 [IU] via SUBCUTANEOUS
  Administered 2018-03-09: 1 [IU] via SUBCUTANEOUS
  Administered 2018-03-09: 2 [IU] via SUBCUTANEOUS

## 2018-03-08 MED ORDER — ACETAMINOPHEN 500 MG PO TABS
500.0000 mg | ORAL_TABLET | Freq: Four times a day (QID) | ORAL | Status: DC | PRN
  Administered 2018-03-08 – 2018-03-09 (×3): 500 mg via ORAL
  Filled 2018-03-08 (×3): qty 1

## 2018-03-08 MED ORDER — INSULIN ASPART 100 UNIT/ML ~~LOC~~ SOLN: 0.0000 [IU] | Freq: Every day | SUBCUTANEOUS | Status: DC

## 2018-03-08 NOTE — Progress Notes (Signed)
PROGRESS NOTE    Theresa Ray  WHQ:[id number] DOB: Sep 25, 1945 DOA: 03/02/2018 PCP: Ronita Hipps, MD   Brief Narrative: Patient is a 72 year old female with past medical history of chronic diastolic CHF, TIAs, diabetes, hypertension, CAD status post CABG x3, postoperative A. fib who was transferred from [facility].  She initially presented to [facility] with altered mental status, left-sided weakness, severe abdominal pain.  Further work-up revealed left ureteral obstruction due to calculus.  Urology was consulted and she underwent double-J stent placement there.  She was transferred to here under ICU service for altered mental status.  MRI showed small multifocal infarcts concerning for embolic stroke.  Neurology, cardiology following.  Underwent TEE and loop recorder placement. Patient transferred to hospitalist service on 03/05/18. She is waiting for bed in CIR.  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   Blood cultures negative so far. Currently she is hemodynamically stable.No fever.Urine culture did not show any growth.  Changed antibiotics to oral.  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  She was found to have 6 mm left upper ureteral stone with hydronephrosis and underwent stent placement.  Acute CVA: MRI showed multifocal infarcts.  Suggestive of embolic process.  Plan is to rule  out endocarditis versus atrial fibrillation.  TTE showed mild reduction in EF to 50 to 55%, hypokinesis of the inferior lateral and inferior myocardium, mild to moderate MR.  TEE did not show any intracardiac source of emboli, negative bubble study, no PFO for ASD.   Neurology following.  She underwent  loop recorder placement to assess for occult A. Fib on 03/06/18 by EP. Currently she is on aspirin, Plavix and statin. She has  weakness on the left side. EEG did not show any seizure activity. Imagings:MRI: Acute right MCA and punctate left MCA infarcts, chronic bilateral BG infarcts. CT head and neck: no LVO, moderate stenosis right V4, left A2, bilateral M2/M3 and left P2  Occult Afib?:She also has history of postop A. fib after bypass surgery November 2018.  She was also on amiodarone for a while.  30-day event monitoring on October 2019 did not show any atrial fibrillation.  No A. fib noted on telemetry so far. Underwent  loop recorder placement .  She follows with Dr. Angelena Form as outpatient.  History of chronic diastolic CHF: Echocardiogram done here showed mildy  reduced ejection fraction.  Chest x-ray yesterday showed possible pulmonary edema.  Ordered a dose of Lasix IV 60 mg on 03/05/18  which much improvement in the respiratory status .  Off oxygen.  Hypertension: Systolic hypertension.  Started on amlodipine.  Also on lisinopril and carvedilol.  History of coronary disease: Status post CABG in November 2018.  Continue aspirin, Plavix, statin and beta-blocker.  AKI: Creatinine at 1.46  today.  We will continue to monitor BMP.  Diabetes type 2: Hemoglobin A1c of 6.  Continue sliding-scale insulin  Hyperlipidemia: LDL of 99.  Continue statin 40 mg daily.  Hypokalemia: Supplemented with potassium.  Deconditioning/debility/disposition: CIR following.  Discharge planning to CIR as soon as bed is available.    Nutrition Problem: Inadequate oral intake Etiology: nausea, vomiting  DVT prophylaxis: SCD Code Status: Full Family Communication: Husband present at the bedside Disposition Plan: CIR as soon as bed is available   Consultants: Cardiology, neurology, PCCM  Procedures: MRI  Antimicrobials: Omnicef  Subjective:  Patient  seen and examined the bedside this morning.  Looks much better today.  More alert and oriented.  Communicating.  Off oxygen.No changes from yesterday. Waiting for bed at CIR  Objective: Vitals:   03/07/18 1057 03/07/18 1941 03/07/18 2340 03/08/18 0410  BP: (!) 152/79 139/82 (!) 155/79 (!) 159/81  Pulse:  64 68 66  Resp:  (!) 22 20 18   Temp: 98.2 F (36.8 C) 98.2 F (36.8 C) 98.9 F (37.2 C) 98.9 F (37.2 C)  TempSrc: Oral Oral Oral Oral  SpO2: 98% 93% 97% 97%  Weight:    89.1 kg  Height:        Intake/Output Summary (Last 24 hours) at 03/08/2018 1112 Last data filed at 03/08/2018 0400 Gross per 24 hour  Intake 960 ml  Output 1225 ml  Net -265 ml   Filed Weights   03/03/18 0500 03/07/18 0500 03/08/18 0410  Weight: 85.6 kg 89.3 kg 89.1 kg    Examination:  General exam: Not in distress,obese HEENT:PERRL,Oral mucosa moist, Ear/Nose normal on gross exam Respiratory system: Decreased air entry in the bases but mostly clear Cardiovascular system: S1 & S2 heard, RRR. No JVD, murmurs, rubs, gallops or clicks. No pedal edema. Gastrointestinal system: Abdomen is nondistended, soft and nontender. No organomegaly or masses felt. Normal bowel sounds heard. Central nervous system: Alert and oriented. Power 4/5 on the right upper and lower extremity, 3 /5 on left upper and lower extremities.  Slow pseech Extremities: No edema, no clubbing ,no cyanosis, distal peripheral pulses palpable. Skin: No rashes, lesions or ulcers,no icterus ,no pallor GU: Foley.     Data Reviewed: I have personally reviewed following labs and imaging studies  CBC: Recent Labs  Lab 03/02/18 2302 03/03/18 0154 03/04/18 0725 03/05/18 0403 03/06/18 0441 03/07/18 0610  WBC 22.6* 19.3* 16.3* 16.0* 12.0* 12.3*  NEUTROABS 20.1*  --   --   --   --   --   HGB 13.9 13.2 11.6* 11.0* 11.4* 11.6*  HCT 41.5 39.2 34.0* 32.9* 33.8* 33.5*  MCV 90.8 90.3 91.4 90.9 90.4 89.1  PLT 352 327 261 325 345 287   Basic  Metabolic Panel: Recent Labs  Lab 03/04/18 0725 03/05/18 0403 03/06/18 0441 03/07/18 0519 03/08/18 0544  NA 139 134* 139 136 133*  K 3.3* 3.1* 3.2* 3.7 3.2*  CL 105 100 103 101 98  CO2 20* 20* 23 21* 23  GLUCOSE 144* 152* 137* 114* 171*  BUN 21 24* 24* 23 21  CREATININE 1.37* 1.44* 1.62* 1.46* 1.23*  CALCIUM 8.2* 8.2* 8.4* 8.2* 8.3*   GFR: Estimated Creatinine Clearance: 47.6 mL/min (A) (by C-G formula based on SCr of 1.23 mg/dL (H)). Liver Function Tests: Recent Labs  Lab 03/02/18 2302  AST 32  ALT 19  ALKPHOS 41  BILITOT 0.9  PROT 6.9  ALBUMIN 3.3*   No results for input(s): LIPASE, AMYLASE in the last 168 hours. No results for input(s): AMMONIA in the last 168 hours. Coagulation Profile: No results for input(s): INR, PROTIME in the last 168 hours. Cardiac Enzymes: No results for input(s): CKTOTAL, CKMB, CKMBINDEX, TROPONINI in the last 168 hours. BNP (last 3 results) No results for input(s): PROBNP in the last 8760 hours. HbA1C: No results for input(s): HGBA1C in the last 72 hours.  CBG: Recent Labs  Lab 03/07/18 1646 03/07/18 1942 03/07/18 2341 03/08/18 0424 03/08/18 0755  GLUCAP 131* 207* 251* 174* 139*   Lipid Profile: No results for input(s): CHOL, HDL, LDLCALC, TRIG, CHOLHDL, LDLDIRECT in the last 72 hours. Thyroid Function Tests: No results for input(s): TSH, T4TOTAL, FREET4, T3FREE, THYROIDAB in the last 72 hours. Anemia Panel: No results for input(s): VITAMINB12, FOLATE, FERRITIN, TIBC, IRON, RETICCTPCT in the last 72 hours. Sepsis Labs: Recent Labs  Lab 03/03/18 0154 03/03/18 1154 03/03/18 1347 03/04/18 0725  LATICACIDVEN 4.2* 1.8 2.0* 1.1    Recent Results (from the past 240 hour(s))  MRSA PCR Screening     Status: None   Collection Time: 03/02/18  9:53 PM  Result Value Ref Range Status   MRSA by PCR NEGATIVE NEGATIVE Final    Comment:        The GeneXpert MRSA Assay (FDA approved for NASAL specimens only), is one component of  a comprehensive MRSA colonization surveillance program. It is not intended to diagnose MRSA infection nor to guide or monitor treatment for MRSA infections. Performed at [facility], [place] [address], [place], [place] [address]   Culture, blood (Routine X 2) w Reflex to ID Panel     Status: None (Preliminary result)   Collection Time: 03/03/18 10:46 AM  Result Value Ref Range Status   Specimen Description BLOOD LEFT HAND  Final   Special Requests   Final    BOTTLES DRAWN AEROBIC ONLY Blood Culture adequate volume   Culture   Final    NO GROWTH 4 DAYS Performed at [facility], [place] [address], [place], [place] [address]    Report Status PENDING  Incomplete  Culture, blood (Routine X 2) w Reflex to ID Panel     Status: None (Preliminary result)   Collection Time: 03/03/18 10:46 AM  Result Value Ref Range Status   Specimen Description BLOOD RIGHT HAND  Final   Special Requests   Final    BOTTLES DRAWN AEROBIC ONLY Blood Culture adequate volume   Culture   Final    NO GROWTH 4 DAYS Performed at [facility], [place] [address] [place], [place] [address]    Report Status PENDING  Incomplete  Culture, Urine     Status: None   Collection Time: 03/05/18 11:55 AM  Result Value Ref Range Status   Specimen Description URINE, CATHETERIZED  Final   Special Requests NONE  Final   Culture   Final    NO GROWTH Performed at [facility], [address], [place], [place] [address]    Report Status 03/06/2018 FINAL  Final         Radiology Studies: No results found.      Scheduled Meds: . amLODipine  10 mg Oral Daily  . aspirin EC  81 mg Oral Daily  . atorvastatin  40 mg Oral Daily  . carvedilol  12.5 mg Oral BID WC  . cefdinir  300 mg Oral Q12H  . chlorhexidine  15 mL Mouth Rinse BID  . citalopram  10 mg Oral Daily  . clopidogrel  75 mg Oral Daily  . insulin aspart  0-5 Units Subcutaneous QHS  . insulin aspart  0-9 Units Subcutaneous TID WC  .  lactose free nutrition  237 mL Oral TID WC  . levothyroxine  175 mcg Oral QAC breakfast  . lisinopril  5 mg Oral Daily  . multivitamin with minerals  1 tablet Oral Daily  .  pantoprazole  40 mg Oral Daily  . potassium chloride  40 mEq Oral Daily  . potassium chloride  40 mEq Oral Once   Continuous Infusions:    LOS: 6 days    Time spent: 25 mins.More than 50% of that time was spent in counseling and/or coordination of care.      Shelly Coss, MD [facility] Pager [phone]  If 7PM-7AM, please contact night-coverage [email or web address] Password [email or web address] 03/08/2018, 11:12 AM

## 2018-03-09 ENCOUNTER — Inpatient Hospital Stay (HOSPITAL_COMMUNITY): Payer: Medicare HMO | Admitting: Occupational Therapy

## 2018-03-09 ENCOUNTER — Inpatient Hospital Stay (HOSPITAL_COMMUNITY): Payer: Medicare HMO | Admitting: Physical Therapy

## 2018-03-09 ENCOUNTER — Inpatient Hospital Stay (HOSPITAL_COMMUNITY): Payer: Medicare HMO

## 2018-03-09 ENCOUNTER — Inpatient Hospital Stay (HOSPITAL_COMMUNITY)
Admission: RE | Admit: 2018-03-09 | Discharge: 2018-03-24 | DRG: 057 | Disposition: A | Discharge status: Home Health | Payer: Medicare HMO | Source: Intra-hospital | Attending: Physical Medicine & Rehabilitation | Admitting: Physical Medicine & Rehabilitation

## 2018-03-09 ENCOUNTER — Other Ambulatory Visit: Payer: Self-pay

## 2018-03-09 ENCOUNTER — Encounter (HOSPITAL_COMMUNITY): Payer: Self-pay | Admitting: Cardiology

## 2018-03-09 DIAGNOSIS — Y838 Other surgical procedures as the cause of abnormal reaction of the patient, or of later complication, without mention of misadventure at the time of the procedure: Secondary | ICD-10-CM | POA: Diagnosis not present

## 2018-03-09 DIAGNOSIS — Z9884 Bariatric surgery status: Secondary | ICD-10-CM

## 2018-03-09 DIAGNOSIS — I69354 Hemiplegia and hemiparesis following cerebral infarction affecting left non-dominant side: Secondary | ICD-10-CM | POA: Diagnosis not present

## 2018-03-09 DIAGNOSIS — D72829 Elevated white blood cell count, unspecified: Secondary | ICD-10-CM

## 2018-03-09 DIAGNOSIS — N132 Hydronephrosis with renal and ureteral calculous obstruction: Secondary | ICD-10-CM

## 2018-03-09 DIAGNOSIS — I10 Essential (primary) hypertension: Secondary | ICD-10-CM

## 2018-03-09 DIAGNOSIS — I110 Hypertensive heart disease with heart failure: Secondary | ICD-10-CM | POA: Diagnosis present

## 2018-03-09 DIAGNOSIS — E039 Hypothyroidism, unspecified: Secondary | ICD-10-CM | POA: Diagnosis present

## 2018-03-09 DIAGNOSIS — N202 Calculus of kidney with calculus of ureter: Secondary | ICD-10-CM | POA: Diagnosis not present

## 2018-03-09 DIAGNOSIS — A419 Sepsis, unspecified organism: Secondary | ICD-10-CM | POA: Diagnosis not present

## 2018-03-09 DIAGNOSIS — R7309 Other abnormal glucose: Secondary | ICD-10-CM | POA: Diagnosis not present

## 2018-03-09 DIAGNOSIS — Z91013 Allergy to seafood: Secondary | ICD-10-CM

## 2018-03-09 DIAGNOSIS — Z7984 Long term (current) use of oral hypoglycemic drugs: Secondary | ICD-10-CM

## 2018-03-09 DIAGNOSIS — R69 Illness, unspecified: Secondary | ICD-10-CM | POA: Diagnosis not present

## 2018-03-09 DIAGNOSIS — I5032 Chronic diastolic (congestive) heart failure: Secondary | ICD-10-CM | POA: Diagnosis present

## 2018-03-09 DIAGNOSIS — N200 Calculus of kidney: Secondary | ICD-10-CM | POA: Diagnosis not present

## 2018-03-09 DIAGNOSIS — D62 Acute posthemorrhagic anemia: Secondary | ICD-10-CM

## 2018-03-09 DIAGNOSIS — Z7982 Long term (current) use of aspirin: Secondary | ICD-10-CM

## 2018-03-09 DIAGNOSIS — E1140 Type 2 diabetes mellitus with diabetic neuropathy, unspecified: Secondary | ICD-10-CM | POA: Diagnosis present

## 2018-03-09 DIAGNOSIS — Z87442 Personal history of urinary calculi: Secondary | ICD-10-CM

## 2018-03-09 DIAGNOSIS — F329 Major depressive disorder, single episode, unspecified: Secondary | ICD-10-CM | POA: Diagnosis not present

## 2018-03-09 DIAGNOSIS — N179 Acute kidney failure, unspecified: Secondary | ICD-10-CM | POA: Diagnosis not present

## 2018-03-09 DIAGNOSIS — R6520 Severe sepsis without septic shock: Secondary | ICD-10-CM | POA: Diagnosis not present

## 2018-03-09 DIAGNOSIS — R0989 Other specified symptoms and signs involving the circulatory and respiratory systems: Secondary | ICD-10-CM

## 2018-03-09 DIAGNOSIS — Z960 Presence of urogenital implants: Secondary | ICD-10-CM | POA: Diagnosis present

## 2018-03-09 DIAGNOSIS — B009 Herpesviral infection, unspecified: Secondary | ICD-10-CM | POA: Diagnosis not present

## 2018-03-09 DIAGNOSIS — E785 Hyperlipidemia, unspecified: Secondary | ICD-10-CM

## 2018-03-09 DIAGNOSIS — Z79899 Other long term (current) drug therapy: Secondary | ICD-10-CM

## 2018-03-09 DIAGNOSIS — Z823 Family history of stroke: Secondary | ICD-10-CM

## 2018-03-09 DIAGNOSIS — Z951 Presence of aortocoronary bypass graft: Secondary | ICD-10-CM | POA: Diagnosis not present

## 2018-03-09 DIAGNOSIS — N201 Calculus of ureter: Secondary | ICD-10-CM

## 2018-03-09 DIAGNOSIS — Z882 Allergy status to sulfonamides status: Secondary | ICD-10-CM

## 2018-03-09 DIAGNOSIS — N12 Tubulo-interstitial nephritis, not specified as acute or chronic: Secondary | ICD-10-CM | POA: Diagnosis not present

## 2018-03-09 DIAGNOSIS — G460 Middle cerebral artery syndrome: Secondary | ICD-10-CM | POA: Diagnosis not present

## 2018-03-09 DIAGNOSIS — Z87891 Personal history of nicotine dependence: Secondary | ICD-10-CM

## 2018-03-09 DIAGNOSIS — E119 Type 2 diabetes mellitus without complications: Secondary | ICD-10-CM | POA: Diagnosis not present

## 2018-03-09 DIAGNOSIS — E1169 Type 2 diabetes mellitus with other specified complication: Secondary | ICD-10-CM | POA: Diagnosis not present

## 2018-03-09 DIAGNOSIS — I6340 Cerebral infarction due to embolism of unspecified cerebral artery: Secondary | ICD-10-CM | POA: Diagnosis not present

## 2018-03-09 DIAGNOSIS — T83123A Displacement of other urinary stents, initial encounter: Secondary | ICD-10-CM | POA: Diagnosis not present

## 2018-03-09 DIAGNOSIS — I951 Orthostatic hypotension: Secondary | ICD-10-CM | POA: Diagnosis not present

## 2018-03-09 DIAGNOSIS — Z8249 Family history of ischemic heart disease and other diseases of the circulatory system: Secondary | ICD-10-CM | POA: Diagnosis not present

## 2018-03-09 DIAGNOSIS — I25810 Atherosclerosis of coronary artery bypass graft(s) without angina pectoris: Secondary | ICD-10-CM | POA: Diagnosis not present

## 2018-03-09 DIAGNOSIS — R4189 Other symptoms and signs involving cognitive functions and awareness: Secondary | ICD-10-CM | POA: Diagnosis present

## 2018-03-09 DIAGNOSIS — E669 Obesity, unspecified: Secondary | ICD-10-CM | POA: Diagnosis not present

## 2018-03-09 DIAGNOSIS — I639 Cerebral infarction, unspecified: Secondary | ICD-10-CM | POA: Diagnosis not present

## 2018-03-09 DIAGNOSIS — R509 Fever, unspecified: Secondary | ICD-10-CM | POA: Diagnosis not present

## 2018-03-09 DIAGNOSIS — I2510 Atherosclerotic heart disease of native coronary artery without angina pectoris: Secondary | ICD-10-CM | POA: Diagnosis not present

## 2018-03-09 DIAGNOSIS — Z79891 Long term (current) use of opiate analgesic: Secondary | ICD-10-CM

## 2018-03-09 DIAGNOSIS — Z90710 Acquired absence of both cervix and uterus: Secondary | ICD-10-CM

## 2018-03-09 DIAGNOSIS — Z7902 Long term (current) use of antithrombotics/antiplatelets: Secondary | ICD-10-CM

## 2018-03-09 DIAGNOSIS — I252 Old myocardial infarction: Secondary | ICD-10-CM

## 2018-03-09 DIAGNOSIS — Z79890 Hormone replacement therapy (postmenopausal): Secondary | ICD-10-CM

## 2018-03-09 DIAGNOSIS — Z881 Allergy status to other antibiotic agents status: Secondary | ICD-10-CM

## 2018-03-09 LAB — BASIC METABOLIC PANEL
Anion gap: 11 (ref 5–15)
BUN: 18 mg/dL (ref 8–23)
CO2: 24 mmol/L (ref 22–32)
Calcium: 8.5 mg/dL — ABNORMAL LOW (ref 8.9–10.3)
Chloride: 101 mmol/L (ref 98–111)
Creatinine, Ser: 1.28 mg/dL — ABNORMAL HIGH (ref 0.44–1.00)
GFR calc Af Amer: 49 mL/min — ABNORMAL LOW (ref >60)
GFR calc non Af Amer: 43 mL/min — ABNORMAL LOW (ref >60)
Glucose, Bld: 159 mg/dL — ABNORMAL HIGH (ref 70–99)
Potassium: 3.4 mmol/L — ABNORMAL LOW (ref 3.5–5.1)
Sodium: 136 mmol/L (ref 135–145)

## 2018-03-09 LAB — GLUCOSE, CAPILLARY
Glucose-Capillary: 219 mg/dL — ABNORMAL HIGH (ref 70–99)
Glucose-Capillary: 153 mg/dL — ABNORMAL HIGH (ref 70–99)
Glucose-Capillary: 157 mg/dL — ABNORMAL HIGH (ref 70–99)
Glucose-Capillary: 138 mg/dL — ABNORMAL HIGH (ref 70–99)

## 2018-03-09 LAB — MAGNESIUM: Magnesium: 1.5 mg/dL — ABNORMAL LOW (ref 1.7–2.4)

## 2018-03-09 MED ORDER — SORBITOL 70 % SOLN
30.0000 mL | Freq: Every day | Status: DC | PRN
  Administered 2018-03-10: 30 mL via ORAL
  Filled 2018-03-09: qty 30

## 2018-03-09 MED ORDER — AMLODIPINE BESYLATE 10 MG PO TABS
10.0000 mg | ORAL_TABLET | Freq: Every day | ORAL | Status: DC
  Administered 2018-03-10 – 2018-03-24 (×14): 10 mg via ORAL
  Filled 2018-03-09 (×15): qty 1

## 2018-03-09 MED ORDER — CEFDINIR 300 MG PO CAPS
300.0000 mg | ORAL_CAPSULE | Freq: Two times a day (BID) | ORAL | Status: AC
  Administered 2018-03-09 – 2018-03-10 (×3): 300 mg via ORAL
  Filled 2018-03-09 (×4): qty 1

## 2018-03-09 MED ORDER — ACETAMINOPHEN 500 MG PO TABS
500.0000 mg | ORAL_TABLET | Freq: Four times a day (QID) | ORAL | Status: DC | PRN
  Administered 2018-03-09 – 2018-03-23 (×26): 500 mg via ORAL
  Filled 2018-03-09 (×27): qty 1

## 2018-03-09 MED ORDER — CEFDINIR 300 MG PO CAPS: 300.0000 mg | ORAL_CAPSULE | Freq: Two times a day (BID) | ORAL | 0 refills | Status: DC

## 2018-03-09 MED ORDER — CITALOPRAM HYDROBROMIDE 10 MG PO TABS
10.0000 mg | ORAL_TABLET | Freq: Every day | ORAL | Status: DC
  Administered 2018-03-10 – 2018-03-24 (×15): 10 mg via ORAL
  Filled 2018-03-09 (×15): qty 1

## 2018-03-09 MED ORDER — LEVOTHYROXINE SODIUM 75 MCG PO TABS
175.0000 ug | ORAL_TABLET | Freq: Every day | ORAL | Status: DC
  Administered 2018-03-10 – 2018-03-24 (×15): 175 ug via ORAL
  Filled 2018-03-09 (×15): qty 1

## 2018-03-09 MED ORDER — PANTOPRAZOLE SODIUM 40 MG PO TBEC
40.0000 mg | DELAYED_RELEASE_TABLET | Freq: Every day | ORAL | Status: DC
  Administered 2018-03-10 – 2018-03-24 (×15): 40 mg via ORAL
  Filled 2018-03-09 (×15): qty 1

## 2018-03-09 MED ORDER — MAGNESIUM SULFATE 2 GM/50ML IV SOLN
2.0000 g | Freq: Once | INTRAVENOUS | Status: DC
  Filled 2018-03-09: qty 50

## 2018-03-09 MED ORDER — CLOPIDOGREL BISULFATE 75 MG PO TABS
75.0000 mg | ORAL_TABLET | Freq: Every day | ORAL | Status: DC
  Administered 2018-03-10 – 2018-03-24 (×15): 75 mg via ORAL
  Filled 2018-03-09 (×15): qty 1

## 2018-03-09 MED ORDER — POTASSIUM CHLORIDE CRYS ER 20 MEQ PO TBCR
40.0000 meq | EXTENDED_RELEASE_TABLET | Freq: Every day | ORAL | Status: DC
  Administered 2018-03-10 – 2018-03-24 (×15): 40 meq via ORAL
  Filled 2018-03-09 (×10): qty 2
  Filled 2018-03-09: qty 4
  Filled 2018-03-09 (×6): qty 2
  Filled 2018-03-09: qty 4

## 2018-03-09 MED ORDER — ADULT MULTIVITAMIN W/MINERALS CH
1.0000 | ORAL_TABLET | Freq: Every day | ORAL | Status: DC
  Administered 2018-03-10 – 2018-03-24 (×15): 1 via ORAL
  Filled 2018-03-09 (×15): qty 1

## 2018-03-09 MED ORDER — LISINOPRIL 5 MG PO TABS
5.0000 mg | ORAL_TABLET | Freq: Every day | ORAL | Status: DC
  Administered 2018-03-10 – 2018-03-24 (×15): 5 mg via ORAL
  Filled 2018-03-09 (×15): qty 1

## 2018-03-09 MED ORDER — INSULIN ASPART 100 UNIT/ML ~~LOC~~ SOLN
0.0000 [IU] | Freq: Three times a day (TID) | SUBCUTANEOUS | Status: DC
  Administered 2018-03-10: 1 [IU] via SUBCUTANEOUS
  Administered 2018-03-10 (×2): 5 [IU] via SUBCUTANEOUS
  Administered 2018-03-11: 7 [IU] via SUBCUTANEOUS
  Administered 2018-03-11: 3 [IU] via SUBCUTANEOUS
  Administered 2018-03-11: 2 [IU] via SUBCUTANEOUS
  Administered 2018-03-12: 3 [IU] via SUBCUTANEOUS
  Administered 2018-03-12: 2 [IU] via SUBCUTANEOUS
  Administered 2018-03-12 – 2018-03-13 (×2): 1 [IU] via SUBCUTANEOUS
  Administered 2018-03-13: 3 [IU] via SUBCUTANEOUS
  Administered 2018-03-13 – 2018-03-14 (×2): 2 [IU] via SUBCUTANEOUS
  Administered 2018-03-14: 1 [IU] via SUBCUTANEOUS
  Administered 2018-03-14: 2 [IU] via SUBCUTANEOUS
  Administered 2018-03-15 (×3): 1 [IU] via SUBCUTANEOUS
  Administered 2018-03-16: 2 [IU] via SUBCUTANEOUS
  Administered 2018-03-16 – 2018-03-17 (×2): 1 [IU] via SUBCUTANEOUS
  Administered 2018-03-17: 2 [IU] via SUBCUTANEOUS
  Administered 2018-03-17: 1 [IU] via SUBCUTANEOUS
  Administered 2018-03-18: 2 [IU] via SUBCUTANEOUS
  Administered 2018-03-18: 1 [IU] via SUBCUTANEOUS
  Administered 2018-03-19 (×2): 3 [IU] via SUBCUTANEOUS
  Administered 2018-03-19 – 2018-03-20 (×3): 1 [IU] via SUBCUTANEOUS
  Administered 2018-03-20 – 2018-03-21 (×2): 2 [IU] via SUBCUTANEOUS
  Administered 2018-03-21: 1 [IU] via SUBCUTANEOUS
  Administered 2018-03-22 – 2018-03-23 (×2): 2 [IU] via SUBCUTANEOUS

## 2018-03-09 MED ORDER — ASPIRIN EC 81 MG PO TBEC
81.0000 mg | DELAYED_RELEASE_TABLET | Freq: Every day | ORAL | Status: DC
  Administered 2018-03-10 – 2018-03-24 (×15): 81 mg via ORAL
  Filled 2018-03-09 (×16): qty 1

## 2018-03-09 MED ORDER — PANTOPRAZOLE SODIUM 40 MG PO TBEC: 40.0000 mg | DELAYED_RELEASE_TABLET | Freq: Every day | ORAL | Status: DC

## 2018-03-09 MED ORDER — BOOST PLUS PO LIQD
237.0000 mL | Freq: Three times a day (TID) | ORAL | Status: DC
  Administered 2018-03-10 – 2018-03-24 (×37): 237 mL via ORAL
  Filled 2018-03-09 (×46): qty 237

## 2018-03-09 MED ORDER — ATORVASTATIN CALCIUM 40 MG PO TABS
40.0000 mg | ORAL_TABLET | Freq: Every day | ORAL | Status: DC
  Administered 2018-03-10 – 2018-03-24 (×15): 40 mg via ORAL
  Filled 2018-03-09 (×15): qty 1

## 2018-03-09 MED ORDER — AMLODIPINE BESYLATE 10 MG PO TABS: 10.0000 mg | ORAL_TABLET | Freq: Every day | ORAL | Status: DC

## 2018-03-09 MED ORDER — MAGNESIUM OXIDE 400 (241.3 MG) MG PO TABS
400.0000 mg | ORAL_TABLET | Freq: Two times a day (BID) | ORAL | Status: DC
  Filled 2018-03-09: qty 1

## 2018-03-09 MED ORDER — MAGNESIUM OXIDE 400 (241.3 MG) MG PO TABS
800.0000 mg | ORAL_TABLET | Freq: Once | ORAL | Status: AC
  Administered 2018-03-09: 800 mg via ORAL

## 2018-03-09 MED ORDER — CARVEDILOL 12.5 MG PO TABS
12.5000 mg | ORAL_TABLET | Freq: Two times a day (BID) | ORAL | Status: DC
  Administered 2018-03-10 – 2018-03-24 (×26): 12.5 mg via ORAL
  Filled 2018-03-09 (×28): qty 1

## 2018-03-09 MED ORDER — MAGNESIUM OXIDE 400 (241.3 MG) MG PO TABS: 400.0000 mg | ORAL_TABLET | Freq: Two times a day (BID) | ORAL | Status: DC

## 2018-03-09 NOTE — Progress Notes (Signed)
Occupational Therapy Treatment Patient Details Name: Theresa Ray MRN: [id number] DOB: 06/24/1945 Today's Date: 03/09/2018    History of present illness Pt is a 71 year old lady with history of CAD and TIAs transferred from [facility] due to altered mental status and left-sided weakness was found to have ureteral obstruction status post stent. MRI revealed multifocal acute bilat small infaccts t/p brain compatible with embolic phenomena. 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   OT comments  Pt progressing towards established OT goals. Pt performing toileting with Min-Mod A +2 and performed functional mobility with Min A +2 to [facility] over toilet and back to bed. Pt requiring Max A for donning/doffing socks. Pt continues to present with poor cognition and inattention to left visual field and requires increased cues throughout. Continue to recommend dc to CIR and will continue to follow acutely as admitted.    Follow Up Recommendations  CIR    Equipment Recommendations  Other (comment)(TBD at next venue of care)    Recommendations for Other Services Rehab consult    Precautions / Restrictions Precautions Precautions: Fall Precaution Comments: L sided inattention Restrictions Weight Bearing Restrictions: No       Mobility Bed Mobility Overal bed mobility: Needs Assistance Bed Mobility: Sit to Supine       Sit to supine: Mod assist   General bed mobility comments: Mod A for BLEs management  Transfers Overall transfer level: Needs assistance Equipment used: Rolling walker (2 wheeled) Transfers: Sit to/from Stand Sit to Stand: Min assist;+2 physical assistance;+2 safety/equipment         General transfer comment: Min A +2 for power up into standing. Requiring increased time to stabilize. Cues throughout for RW management and hand placement    Balance Overall balance assessment: Needs assistance Sitting-balance support: Single extremity supported;Feet supported Sitting  balance-Leahy Scale: Fair Sitting balance - Comments: Pt able to complete period of sitting balance with no UE support, but unable to accept challenge. Pt demonstrates posterior lean once fatigued, but shows ability to correct with VC.  Postural control: Posterior lean Standing balance support: Bilateral upper extremity supported;During functional activity Standing balance-Leahy Scale: Poor Standing balance comment: Pt relies on BUE support during ambulation. Pt able to tolerate standing balance with LUE support and min A.                             ADL either performed or assessed with clinical judgement   ADL Overall ADL's : Needs assistance/impaired     Grooming: Wash/dry face;Sitting;Min guard;Wash/dry Nurse, mental health Details (indicate cue type and reason): Pt washing her face while seated at toilet with Min Guard A. Pt performing hand hygiene while seated with Min guard A             Lower Body Dressing: Maximal assistance;Sit to/from stand Lower Body Dressing Details (indicate cue type and reason): Max A to don/doff socks Toilet Transfer: Minimal assistance;+2 for safety/equipment;Ambulation;RW;BSC(BSC over toilet) Toilet Transfer Details (indicate cue type and reason): Min A to power up into standing and for safe descent.  Toileting- Clothing Manipulation and Hygiene: Moderate assistance;Sit to/from stand Toileting - Clothing Manipulation Details (indicate cue type and reason): Mod A for peri care     Functional mobility during ADLs: Total assistance;+2 for physical assistance General ADL Comments: patient significantly limited by cognition, L inattention/neglect and decreased activity tolerance     Vision   Additional Comments: Continues to present with left inattention and requiring increased  cues   Perception     Praxis      Cognition Arousal/Alertness: Awake/alert Behavior During Therapy: Flat affect Overall Cognitive Status: Impaired/Different from  baseline Area of Impairment: Safety/judgement;Awareness;Problem solving;Attention;Memory;Following commands                   Current Attention Level: Sustained Memory: Decreased recall of precautions;Decreased short-term memory Following Commands: Follows one step commands with increased time;Follows one step commands inconsistently Safety/Judgement: Decreased awareness of safety;Decreased awareness of deficits Awareness: Emergent Problem Solving: Decreased initiation;Slow processing;Difficulty sequencing;Requires verbal cues;Requires tactile cues General Comments: Pt presenting with poor attention and processing. Performing ADLs with significant amounts of time and requires cues to compelete a task. I.e. during toileting, provdiing pt with wash cloth and cues for peri care. pt began washing her face and was unable to recall that she was going to perform peri care        Exercises     Shoulder Instructions       General Comments      Pertinent Vitals/ Pain       Pain Assessment: No/denies pain  Home Living                                          Prior Functioning/Environment              Frequency  Min 2X/week        Progress Toward Goals  OT Goals(current goals can now be found in the care plan section)  Progress towards OT goals: Progressing toward goals  Acute Rehab OT Goals Patient Stated Goal: to go home Time For Goal Achievement: 03/17/18 Potential to Achieve Goals: Good ADL Goals Pt Will Perform Grooming: with set-up;sitting Pt Will Perform Upper Body Bathing: with set-up;with supervision;sitting Pt Will Perform Lower Body Dressing: sit to/from stand;with min assist Pt Will Transfer to Toilet: with min assist;ambulating Additional ADL Goal #1: Pt will follow 1 step command with 75% accuary and scan to locate items on L side of tray table with minimal cueing.  Plan Discharge plan remains appropriate    Co-evaluation                  AM-PAC OT "6 Clicks" Daily Activity     Outcome Measure   Help from another person eating meals?: Total Help from another person taking care of personal grooming?: A Lot Help from another person toileting, which includes using toliet, bedpan, or urinal?: A Lot Help from another person bathing (including washing, rinsing, drying)?: A Lot Help from another person to put on and taking off regular upper body clothing?: A Lot Help from another person to put on and taking off regular lower body clothing?: A Lot 6 Click Score: 11    End of Session Equipment Utilized During Treatment: Gait belt;Rolling walker  OT Visit Diagnosis: Other abnormalities of gait and mobility (R26.89);Muscle weakness (generalized) (M62.81);Other symptoms and signs involving cognitive function;Hemiplegia and hemiparesis;Cognitive communication deficit (R41.841) Symptoms and signs involving cognitive functions: Cerebral infarction Hemiplegia - Right/Left: Left Hemiplegia - dominant/non-dominant: Non-Dominant Hemiplegia - caused by: Cerebral infarction   Activity Tolerance Patient tolerated treatment well;Patient limited by lethargy   Patient Left in bed;with call bell/phone within reach;with bed alarm set   Nurse Communication Mobility status        Time: 0272-5366 OT Time Calculation (min): 31 min  Charges: OT General Charges $OT  Visit: 1 Visit OT Treatments $Self Care/Home Management : 23-37 mins  Joy, OTR/L Acute Rehab Pager: [phone] Office: [place] 03/09/2018, 4:46 PM

## 2018-03-09 NOTE — Progress Notes (Signed)
Inpatient Rehabilitation-Admissions Coordinator   [facility] has been notified of initial denial for pt's request for CIR. AC has notified MD of initial denial with option for peer to peer. Dr. Tawanna Solo has agreed to do peer to peer. Will update once final decision has been made.   Please call if questions.   Jhonnie Garner, OTR/L  Rehab Admissions Coordinator  [phone] 03/09/2018 12:27 PM

## 2018-03-09 NOTE — Progress Notes (Signed)
PROGRESS NOTE    Theresa Ray  JOI:[id number] DOB: 11-13-1945 DOA: 03/02/2018 PCP: Ronita Hipps, MD   Brief Narrative: Patient is a 72 year old female with past medical history of chronic diastolic CHF, TIAs, diabetes, hypertension, CAD status post CABG x3, postoperative A. fib who was transferred from [facility].  She initially presented to [facility] with altered mental status, left-sided weakness, severe abdominal pain.  Further work-up revealed left ureteral obstruction due to calculus.  Urology was consulted and she underwent double-J stent placement there.  She was transferred to here under ICU service for altered mental status.  MRI showed small multifocal infarcts concerning for embolic stroke.  Neurology, cardiology following.  Underwent TEE and loop recorder placement. Patient transferred to hospitalist service on 03/05/18. She is waiting for bed in CIR.She is medically stable for discharge.  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   Blood cultures negative so far. Currently she is hemodynamically stable.No fever.Urine culture did not show any growth.  Changed antibiotics to oral.  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  She was found to have 6 mm left upper ureteral stone with hydronephrosis and underwent stent placement.  Acute CVA: MRI showed multifocal infarcts.  Suggestive of embolic process.  Plan is to rule  out endocarditis versus atrial fibrillation.  TTE showed mild reduction in EF to 50 to 55%, hypokinesis of the inferior lateral and inferior myocardium, mild to moderate MR.  TEE did not show any intracardiac source of emboli, negative  bubble study, no PFO for ASD.  Neurology was following.  She underwent  loop recorder placement to assess for occult A. Fib on 03/06/18 by EP. Currently she is on aspirin, Plavix and statin. She has  weakness on the left side. EEG did not show any seizure activity. Imagings:MRI: Acute right MCA and punctate left MCA infarcts, chronic bilateral BG infarcts. CT head and neck: no LVO, moderate stenosis right V4, left A2, bilateral M2/M3 and left P2  Occult Afib?:She also has history of postop A. fib after bypass surgery November 2018.  She was also on amiodarone for a while.  30-day event monitoring on October 2019 did not show any atrial fibrillation.  No A. fib noted on telemetry so far. Underwent  loop recorder placement .  She follows with Dr. Angelena Form as outpatient.  History of chronic diastolic CHF: Echocardiogram done here showed mildy  reduced ejection fraction.  Chest x-ray yesterday showed possible pulmonary edema.  Ordered a dose of Lasix IV 60 mg on 03/05/18  which much improvement in the respiratory status .  Off oxygen.  Hypertension: Systolic hypertension.  Started on amlodipine.  Also on lisinopril and carvedilol.  History of coronary disease: Status post CABG in November 2018.  Continue aspirin, Plavix, statin and beta-blocker.  AKI: Creatinine at 1.28  Today.This might be her new baseline.  We will continue to monitor BMP.  Diabetes type 2: Hemoglobin A1c of 6.  Continue sliding-scale insulin  Hyperlipidemia: LDL of 99.  Continue statin 40 mg daily.  Hypokalemia: Supplemented with potassium.  Deconditioning/debility/disposition: CIR following.  Discharge planning to CIR as soon as bed is available.    Nutrition Problem:  Inadequate oral intake Etiology: nausea, vomiting      DVT prophylaxis: SCD Code Status: Full Family Communication: Husband present at the bedside Disposition Plan: CIR as soon as bed is available   Consultants: Cardiology, neurology,  PCCM  Procedures: MRI  Antimicrobials: Omnicef  Subjective:  Patient seen and examined the bedside this morning.  Looks much better today.  More alert and oriented.  Communicating.  Off oxygen.No changes from yesterday. Waiting for bed at CIR  Objective: Vitals:   03/08/18 2350 03/09/18 0400 03/09/18 0727 03/09/18 1152  BP: (!) 161/71 (!) 158/67 (!) 156/71 140/62  Pulse: 66 60 69 62  Resp: 17 20 16 16   Temp: 98.1 F (36.7 C) 98 F (36.7 C) 98.9 F (37.2 C) 98.9 F (37.2 C)  TempSrc: Oral Oral Oral Oral  SpO2: 97% 93% 100% 94%  Weight:  88.6 kg    Height:        Intake/Output Summary (Last 24 hours) at 03/09/2018 1436 Last data filed at 03/09/2018 1426 Gross per 24 hour  Intake -  Output 2350 ml  Net -2350 ml   Filed Weights   03/07/18 0500 03/08/18 0410 03/09/18 0400  Weight: 89.3 kg 89.1 kg 88.6 kg    Examination:  General exam: Not in distress,obese HEENT:PERRL,Oral mucosa moist, Ear/Nose normal on gross exam Respiratory system: Decreased air entry in the bases but mostly clear Cardiovascular system: S1 & S2 heard, RRR. No JVD, murmurs, rubs, gallops or clicks. No pedal edema. Gastrointestinal system: Abdomen is nondistended, soft and nontender. No organomegaly or masses felt. Normal bowel sounds heard. Central nervous system: Alert and oriented. Power 4/5 on the right upper and lower extremity, 3 /5 on left upper and lower extremities.  Slow pseech Extremities: No edema, no clubbing ,no cyanosis, distal peripheral pulses palpable. Skin: No rashes, lesions or ulcers,no icterus ,no pallor     Data Reviewed: I have personally reviewed following labs and imaging studies  CBC: Recent Labs  Lab 03/02/18 2302 03/03/18 0154 03/04/18 0725 03/05/18 0403 03/06/18 0441 03/07/18 0610  WBC 22.6* 19.3* 16.3* 16.0* 12.0* 12.3*  NEUTROABS 20.1*  --   --   --   --   --   HGB 13.9 13.2 11.6* 11.0* 11.4* 11.6*  HCT 41.5 39.2 34.0* 32.9* 33.8* 33.5*  MCV 90.8 90.3 91.4  90.9 90.4 89.1  PLT 352 327 261 325 345 756   Basic Metabolic Panel: Recent Labs  Lab 03/05/18 0403 03/06/18 0441 03/07/18 0519 03/08/18 0544 03/09/18 0532  NA 134* 139 136 133* 136  K 3.1* 3.2* 3.7 3.2* 3.4*  CL 100 103 101 98 101  CO2 20* 23 21* 23 24  GLUCOSE 152* 137* 114* 171* 159*  BUN 24* 24* 23 21 18   CREATININE 1.44* 1.62* 1.46* 1.23* 1.28*  CALCIUM 8.2* 8.4* 8.2* 8.3* 8.5*  MG  --   --   --   --  1.5*   GFR: Estimated Creatinine Clearance: 45.6 mL/min (A) (by C-G formula based on SCr of 1.28 mg/dL (H)). Liver Function Tests: Recent Labs  Lab 03/02/18 2302  AST 32  ALT 19  ALKPHOS 41  BILITOT 0.9  PROT 6.9  ALBUMIN 3.3*   No results for input(s): LIPASE, AMYLASE in the last 168 hours. No results for input(s): AMMONIA in the last 168 hours. Coagulation Profile: No results for input(s): INR, PROTIME in the last 168 hours. Cardiac Enzymes: No results for input(s): CKTOTAL, CKMB, CKMBINDEX, TROPONINI in the last 168 hours. BNP (last 3  results) No results for input(s): PROBNP in the last 8760 hours. HbA1C: No results for input(s): HGBA1C in the last 72 hours. CBG: Recent Labs  Lab 03/08/18 1145 03/08/18 1707 03/08/18 2139 03/09/18 0621 03/09/18 1238  GLUCAP 164* 267* 174* 153* 157*   Lipid Profile: No results for input(s): CHOL, HDL, LDLCALC, TRIG, CHOLHDL, LDLDIRECT in the last 72 hours. Thyroid Function Tests: No results for input(s): TSH, T4TOTAL, FREET4, T3FREE, THYROIDAB in the last 72 hours. Anemia Panel: No results for input(s): VITAMINB12, FOLATE, FERRITIN, TIBC, IRON, RETICCTPCT in the last 72 hours. Sepsis Labs: Recent Labs  Lab 03/03/18 0154 03/03/18 1154 03/03/18 1347 03/04/18 0725  LATICACIDVEN 4.2* 1.8 2.0* 1.1    Recent Results (from the past 240 hour(s))  MRSA PCR Screening     Status: None   Collection Time: 03/02/18  9:53 PM  Result Value Ref Range Status   MRSA by PCR NEGATIVE NEGATIVE Final    Comment:        The  GeneXpert MRSA Assay (FDA approved for NASAL specimens only), is one component of a comprehensive MRSA colonization surveillance program. It is not intended to diagnose MRSA infection nor to guide or monitor treatment for MRSA infections. Performed at [facility], [place] [address] [place], [place] [address]   Culture, blood (Routine X 2) w Reflex to ID Panel     Status: None   Collection Time: 03/03/18 10:46 AM  Result Value Ref Range Status   Specimen Description BLOOD LEFT HAND  Final   Special Requests   Final    BOTTLES DRAWN AEROBIC ONLY Blood Culture adequate volume   Culture   Final    NO GROWTH 5 DAYS Performed at [facility], [place] [address] [place], [place] [address]    Report Status 03/08/2018 FINAL  Final  Culture, blood (Routine X 2) w Reflex to ID Panel     Status: None   Collection Time: 03/03/18 10:46 AM  Result Value Ref Range Status   Specimen Description BLOOD RIGHT HAND  Final   Special Requests   Final    BOTTLES DRAWN AEROBIC ONLY Blood Culture adequate volume   Culture   Final    NO GROWTH 5 DAYS Performed at [facility], [place] [address] [place], [place] [address]    Report Status 03/08/2018 FINAL  Final  Culture, Urine     Status: None   Collection Time: 03/05/18 11:55 AM  Result Value Ref Range Status   Specimen Description URINE, CATHETERIZED  Final   Special Requests NONE  Final   Culture   Final    NO GROWTH Performed at [facility], [place] [address] [place], [place] [address]    Report Status 03/06/2018 FINAL  Final         Radiology Studies: No results found.      Scheduled Meds: . amLODipine  10 mg Oral Daily  . aspirin EC  81 mg Oral Daily  . atorvastatin  40 mg Oral Daily  . carvedilol  12.5 mg Oral BID WC  . cefdinir  300 mg Oral Q12H  . chlorhexidine  15 mL Mouth Rinse BID  . citalopram  10 mg Oral Daily  . clopidogrel  75 mg Oral Daily  . insulin aspart  0-5 Units Subcutaneous QHS  .  insulin aspart  0-9 Units Subcutaneous TID WC  . lactose free nutrition  237 mL Oral TID WC  . levothyroxine  175 mcg Oral  QAC breakfast  . lisinopril  5 mg Oral Daily  . multivitamin with minerals  1 tablet Oral Daily  . pantoprazole  40 mg Oral Daily  . potassium chloride  40 mEq Oral Daily  . potassium chloride  40 mEq Oral Once   Continuous Infusions:    LOS: 7 days    Time spent: 25 mins.More than 50% of that time was spent in counseling and/or coordination of care.      Shelly Coss, MD [facility] Pager [phone]  If 7PM-7AM, please contact night-coverage [email or web address] Password [email or web address] 03/09/2018, 2:36 PM

## 2018-03-09 NOTE — Consult Note (Signed)
            [facility] CM Primary Care Navigator  03/09/2018  Theresa Ray 04/28/1945 [id number]   Seen patient atthe bedsideto identify possible discharge needs.  Patientwas transferred and [facility] with altered mental status, left-sided weakness, severe abdominal pain and with further work-up that revealed left ureteral obstruction due to calculus. She was transferred under ICU service for altered mental status with MRI showing small multifocal infarcts concerning for embolic stroke. (severe sepsis, acute CVA- cerebrovascular accident, pyelonephritis)  Patient confirmsDr.Lynley Helene Kelp with [facility] primary care provider.   [facility] [place] [facility].  Patientverbalized that she has beenmanaging her own medications at home with use of "pill box" system filled once a week.  Patientreports that husband Theresa Ray) has been transporting her to doctors' appointments.  Patient lives with husband who will serve as her primary caregiver at home.  Anticipated plan for discharge [place] per therapy recommendation.  Patientvoiced understandingto callprimary careprovider's office when she returns backhomefor a post discharge follow-upvisit within1- 2 weeksor sooner if needs arise.Patient letter (with PCP's contact number) was provided as areminder.   Explained topatientabout [facility] CM services available for health management andresourcesat homeand she seemed interested with it.  Patient states understanding todiscuss with primary care provider onher next visit, aboutfurther needsand assistance inmanaging hercurrent health conditions once shegetsback home. Patientexpressedunderstandingto seekreferral from primary care provider to [facility] care management asdeemed necessary and appropriatefor anyservicesin the near future- when she  returns tohome.   [facility] care management information was provided for future needs that she may have.  Primary care provider's office is listed as providing transition of care (TOC) follow-up.  Noted EMMI stroke calls already in place to follow-up patient's recovery.    For additional questions please contact:  Edwena Felty A. Izaha Shughart, BSN, RN-BC [facility] PRIMARY CARE Navigator Cell: [phone]

## 2018-03-09 NOTE — Progress Notes (Signed)
Inpatient Rehabilitation-Admissions Coordinator   Pt's initial denial has been overturned by peer to peer with Dr. Tawanna Solo. Pt has been approved for admit to CIR and will admit today. Pt, her husband, CM, SW, and RN aware of plan. Please call if questions.   Jhonnie Garner, OTR/L  Rehab Admissions Coordinator  [phone] 03/09/2018 4:20 PM

## 2018-03-09 NOTE — Progress Notes (Signed)
Patient ID: Theresa Ray, female   DOB: 07/04/1945, 72 y.o.   MRN: [id number] Patient and belongings arrived from [phone] with RN. Patient oriented to rehab schedule, rehab process, fall prevention plan, rehab safety plan, and nurse call system. Patient Loop Recorded set up beside bed. No complaints of pain. Patient resting comfortably in bed with bed alarm on and call bell by side.

## 2018-03-09 NOTE — H&P (Signed)
Physical Medicine and Rehabilitation Admission H&P     HPI: Theresa Ray is a 72 year old right-handed female with history of CAD/CABG 2018 maintained on aspirin and Plavix, chronic diastolic congestive heart failure, diabetes mellitus, orthostatic hypotension. Per chart review, patient and husband, patient lives with spouse. Independent prior to admission. One level home with 6-9 to entry. Husband works during the day. Presented 03/02/2018 with altered mental status and left side weakness to [place] showed no large vessel occlusion. Patient did not receive TPA. Noted leukocytosis as well as creatinine 1.87 with CT of abdomen being completed showing left urteric obstruction secondary to kidney stone was taken to the OR for a double-J stent. She was then transferred to [facility] for further evaluation of left-sided weakness. MRI reviewed, showing bilateral infarcts. Per report, CT/MRI follow-up while at [facility] showed multifocal acute bilateral small infarct spanning multiple vascular territories. 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 Echocardiogram with ejection fraction of 55%. Systolic function was normal. TEE with no intracardiac source of emboli, negative bubble study without PFO and loop recorder placement 03/06/2018 . Follow-up urology services after recent placement of stent for kidney stone Dr.Dahlstedt and continue plan of care with follow-up urology services at [facility] did spike a low-grade fever blood cultures no growth to date, lactic acid 4.2 down to 2.0 and placed on Rocephin empirically changed Omnicef x four days..Leukocytosis improving from 22,600-12,000. Presently remains on aspirin and Plavix for CVA prophylaxis.Therapy evaluations completed with recommendations of physical medicine rehabilitation consult. Patient was admitted for a comprehensive rehabilitation  program.  Review of Systems  Constitutional: Negative for chills and fever.  HENT: Negative for hearing loss.   Eyes: Positive for blurred vision.  Respiratory: Negative for cough and shortness of breath.   Cardiovascular: Positive for leg swelling. Negative for chest pain and palpitations.  Gastrointestinal: Positive for constipation. Negative for nausea.  Genitourinary: Negative for flank pain and hematuria.  Musculoskeletal: Positive for joint pain and myalgias.  Skin: Negative for rash.  Neurological: Positive for focal weakness.  All other systems reviewed and are negative.  Past Medical History:  Diagnosis Date  . CAD in native artery    a. s/p CABGx3 in 01/2017.  Marland Kitchen Chronic diastolic CHF (congestive heart failure) ([place])   . Diabetes mellitus ([place])   . Diabetic neuropathy ([place])   . History of kidney stones    history of stones  . Hypertension   . Hypothyroidism   . NSTEMI (non-ST elevated myocardial infarction) ([place])   . Orthostatic hypotension   . Postoperative atrial fibrillation ([place])    a. after CABG 01/2017.  Marland Kitchen TIA (transient ischemic attack)    Past Surgical History:  Procedure Laterality Date  . ABDOMINAL HYSTERECTOMY    . CORONARY ARTERY BYPASS GRAFT N/A 02/24/2017   Procedure: CORONARY ARTERY BYPASS GRAFTING (CABG) USING LEFT INTERNAL MAMMARY ARTERY TO LAD AND ENDOSCOPICALLY HARVESTED GREATER SAPHENOUS VEIN TO PDA AND TO OM1.;  Surgeon: Grace Isaac, MD;  Location: [place];  Service: Open Heart Surgery;  Laterality: N/A;  . ENDOVEIN HARVEST OF GREATER SAPHENOUS VEIN Right 02/24/2017   Procedure: ENDOVEIN HARVEST OF GREATER SAPHENOUS VEIN;  Surgeon: Grace Isaac, MD;  Location: [place];  Service: Open Heart Surgery;  Laterality: Right;  . LAPAROSCOPIC GASTRIC BANDING    . LEFT HEART CATH AND CORONARY ANGIOGRAPHY N/A 02/18/2017   Procedure: LEFT HEART CATH AND CORONARY ANGIOGRAPHY;  Surgeon:  Leonie Man, MD;  Location: [place] [facility];  Service:  Cardiovascular;  Laterality: N/A;  . LOOP RECORDER INSERTION N/A 03/06/2018   Procedure: LOOP RECORDER INSERTION;  Surgeon: Constance Haw, MD;  Location: [place] [facility];  Service: Cardiovascular;  Laterality: N/A;  . TEE WITHOUT CARDIOVERSION N/A 02/24/2017   Procedure: TRANSESOPHAGEAL ECHOCARDIOGRAM (TEE);  Surgeon: Grace Isaac, MD;  Location: [place];  Service: Open Heart Surgery;  Laterality: N/A;  . TEE WITHOUT CARDIOVERSION N/A 03/06/2018   Procedure: TRANSESOPHAGEAL ECHOCARDIOGRAM (TEE);  Surgeon: Dorothy Spark, MD;  Location: [facility] END[facility];  Service: Cardiovascular;  Laterality: N/A;  loop   Family History  Problem Relation Age of Onset  . Heart disease Mother 11       CABG  . CVA Father    Social History:  reports that she quit smoking about 44 years ago. Her smoking use included cigarettes. She started smoking about 52 years ago. She has never used smokeless tobacco. She reports that she does not drink alcohol or use drugs. Allergies:  Allergies  Allergen Reactions  . Sulfa Antibiotics Nausea And Vomiting  . Shellfish Allergy Other (See Comments)    UNKNOWN REACTION.  Allergy found on ALLERGY TEST.  . Aggrenox [Aspirin-Dipyridamole Er] Other (See Comments)    Redness in eyes, takes aspirin 325 at home at baseline   Medications Prior to Admission  Medication Sig Dispense Refill  . acetaminophen (TYLENOL) 325 MG tablet Take 2 tablets (650 mg total) by mouth every 6 (six) hours as needed for mild pain.    Marland Kitchen aspirin EC 81 MG tablet Take 1 tablet (81 mg total) by mouth daily. 90 tablet 3  . atorvastatin (LIPITOR) 40 MG tablet Take 1 tablet (40 mg total) by mouth daily. 90 tablet 3  . Carboxymethylcellul-Glycerin (CLEAR EYES FOR DRY EYES) 1-0.25 % SOLN Place 1 drop into both eyes as needed.    . carvedilol (COREG) 12.5 MG tablet Take 1 tablet (12.5 mg total) by mouth 2 (two) times daily. 60 tablet 11  . cholecalciferol (VITAMIN D) 1000 units tablet Take 3,000  Units 2 (two) times daily by mouth.    . clopidogrel (PLAVIX) 75 MG tablet Take 1 tablet (75 mg total) by mouth daily. 90 tablet 3  . co-enzyme Q-10 30 MG capsule Take 30 mg daily by mouth.    Marland Kitchen glipiZIDE (GLUCOTROL) 10 MG tablet Take 10 mg 2 (two) times daily by mouth.    . Glucosamine-Chondroit-Vit C-Mn (GLUCOSAMINE CHONDR 500 COMPLEX PO) Take 1 tablet 2 (two) times daily by mouth.    Javier Docker Oil 1000 MG CAPS Take 1,000 mg daily by mouth.    . levothyroxine (SYNTHROID, LEVOTHROID) 175 MCG tablet Take 175 mcg daily before breakfast by mouth.    Marland Kitchen lisinopril (PRINIVIL,ZESTRIL) 5 MG tablet Take 1 tablet (5 mg total) by mouth daily. 30 tablet 3  . MELATONIN PO Take 10 mg at bedtime by mouth.    . metFORMIN (GLUCOPHAGE) 1000 MG tablet Take 1,000 mg 2 (two) times daily by mouth.    . Multiple Vitamin (MULTIVITAMIN WITH MINERALS) TABS tablet Take 1 tablet daily by mouth.    . traMADol (ULTRAM) 50 MG tablet Take 50 mg by mouth 2 (two) times daily. For restless leg  1    Drug Regimen Review Drug regimen was reviewed and remains appropriate with no significant issues identified  Home: Home Living Family/patient expects to be discharged to:: Private residence Living Arrangements: Spouse/significant other Available Help  at Discharge: Family, Available 24 hours/day Type of Home: House Home Access: Stairs to enter [employer] of Steps: 6-9 Entrance Stairs-Rails: Can reach both Home Layout: One level Bathroom Shower/Tub: Chiropodist: Standard Home Equipment: Environmental consultant - 2 wheels, Bedside commode, Hand held shower head   Functional History: Prior Function Level of Independence: Independent Comments: independent with mobility and ADLs, no IADLs (some driving)   Functional Status:  Mobility: Bed Mobility Overal bed mobility: Needs Assistance Bed Mobility: Supine to Sit Rolling: Total assist, +2 for physical assistance Sidelying to sit: Total assist, +2 for  physical assistance Supine to sit: Mod assist Sit to supine: Max assist, +2 for physical assistance Sit to sidelying: Total assist, +2 for physical assistance General bed mobility comments: Pt requires VC throughout to complete supine to sit. Pt requires mod A to bring LLE EOB, and for initiation trunk stability. Pt aids using RUE on the bed rail. Pt moves at decreased speed, and increased time is needed.  Transfers Overall transfer level: Needs assistance Equipment used: None, Rolling walker (2 wheeled) Transfers: Sit to/from Stand, [employer] Transfers Sit to Stand: Min assist, Mod assist Stand pivot transfers: Min assist, +2 safety/equipment General transfer comment: Pt able to complete sit to stand 2 times. Pt initially requires mod A for power initiation, and stability without the use of the walker. Pt then completed a sit to stand with min A and the use of a RW, with verbal cueing given for BUE placement. Throughout, pt was given VC for upright posture, and maintaining vertical gaze stabilization. Min A x2 for safety was given during stand pivot without the use of a RW.   Ambulation/Gait Ambulation/Gait assistance: Min guard, +2 safety/equipment(chair follow) Gait Distance (Feet): 40 Feet(20x2) Assistive device: Rolling walker (2 wheeled) Gait Pattern/deviations: Step-through pattern, Decreased stride length, Decreased dorsiflexion - left, Decreased weight shift to left General Gait Details: Pt requires VC throughout to maintain body positioning inside the RW, and for erect standing posture. Pt required rest break after stating the need to sit once entering into the hallway. Pt was then able to ambulate back into the room. 2 person needed for chair follow and safety Gait velocity: slow Gait velocity interpretation: <1.8 ft/sec, indicate of risk for recurrent falls    ADL: ADL Overall ADL's : Needs assistance/impaired Grooming: Wash/dry face, Minimal assistance, Bed level Upper Body  Bathing: Maximal assistance, Bed level Lower Body Bathing: Total assistance, +2 for safety/equipment, +2 for physical assistance, Sit to/from stand Upper Body Dressing : Maximal assistance, Sitting Lower Body Dressing: Total assistance, +2 for physical assistance, Sit to/from stand Toilet Transfer Details (indicate cue type and reason): deferred Functional mobility during ADLs: Total assistance, +2 for physical assistance General ADL Comments: patient significantly limited by cognition, L inattention/neglect and decreased activity tolerance  Cognition: Cognition Overall Cognitive Status: Impaired/Different from baseline Arousal/Alertness: Awake/alert Orientation Level: Oriented to person, Oriented to place, Disoriented to time Attention: Focused Focused Attention: Impaired Focused Attention Impairment: Verbal basic, Functional basic Memory: Impaired Memory Impairment: Storage deficit Awareness: Impaired Awareness Impairment: Intellectual impairment Problem Solving: Impaired Problem Solving Impairment: Functional basic Executive Function: (all impaired due to lower level deficits) Safety/Judgment: Impaired Comments: left inattention  Cognition Arousal/Alertness: Awake/alert Behavior During Therapy: Flat affect Overall Cognitive Status: Impaired/Different from baseline Area of Impairment: Safety/judgement, Awareness, Problem solving, Attention, Memory, Following commands Orientation Level: Disoriented to, Place, Situation Current Attention Level: Sustained Memory: Decreased recall of precautions, Decreased short-term memory Following Commands: Follows one step commands with increased  time, Follows one step commands inconsistently Safety/Judgement: Decreased awareness of safety, Decreased awareness of deficits Awareness: Emergent Problem Solving: Decreased initiation, Slow processing, Difficulty sequencing, Requires verbal cues, Requires tactile cues General Comments: multimodal  cueing needed throughout. Pt demonstrated decreased attention to L side  Physical Exam: Blood pressure 140/62, pulse 62, temperature 98.9 F (37.2 C), temperature source Oral, resp. rate 16, height 5\' 5"  (1.651 m), weight 88.6 kg, SpO2 94 %. Physical Exam  Vitals reviewed. Constitutional: She appears well-developed.  Obese  HENT:  Head: Normocephalic and atraumatic.  Eyes: EOM are normal. Right eye exhibits no discharge. Left eye exhibits no discharge.  Pupils reactive to light  Neck: Normal range of motion. Neck supple. No thyromegaly present.  Cardiovascular: Normal rate and regular rhythm.  Respiratory: Effort normal and breath sounds normal. No respiratory distress.  GI: Soft. Bowel sounds are normal. She exhibits no distension.  Musculoskeletal:  No edema or tenderness in extremities  Neurological: She is alert.  Patient is a bit lethargic but arousable.  Easily distracted and needs some redirection.  Follows simple commands Motor: LUE: 4-4+/5 proximal to distal LLE: HF, KE 3/5, ADF 4/5 RUE: 4/5 proximal to distal  RLE: HF, KE 3/5, ADF 0/5  Skin: Skin is warm and dry.  Psychiatric: Her affect is blunt. Her speech is delayed. She is slowed.    Results for orders placed or performed during the hospital encounter of 03/02/18 (from the past 48 hour(s))  Glucose, capillary     Status: Abnormal   Collection Time: 03/07/18  4:46 PM  Result Value Ref Range   Glucose-Capillary 131 (H) 70 - 99 mg/dL  Glucose, capillary     Status: Abnormal   Collection Time: 03/07/18  7:42 PM  Result Value Ref Range   Glucose-Capillary 207 (H) 70 - 99 mg/dL   Comment 1 Notify RN    Comment 2 Document in Chart   Glucose, capillary     Status: Abnormal   Collection Time: 03/07/18 11:41 PM  Result Value Ref Range   Glucose-Capillary 251 (H) 70 - 99 mg/dL   Comment 1 Notify RN    Comment 2 Document in Chart   Glucose, capillary     Status: Abnormal   Collection Time: 03/08/18  4:24 AM  Result  Value Ref Range   Glucose-Capillary 174 (H) 70 - 99 mg/dL   Comment 1 Notify RN    Comment 2 Document in Chart   Basic metabolic panel     Status: Abnormal   Collection Time: 03/08/18  5:44 AM  Result Value Ref Range   Sodium 133 (L) 135 - 145 mmol/L   Potassium 3.2 (L) 3.5 - 5.1 mmol/L   Chloride 98 98 - 111 mmol/L   CO2 23 22 - 32 mmol/L   Glucose, Bld 171 (H) 70 - 99 mg/dL   BUN 21 8 - 23 mg/dL   Creatinine, Ser 1.23 (H) 0.44 - 1.00 mg/dL   Calcium 8.3 (L) 8.9 - 10.3 mg/dL   GFR calc non Af Amer 45 (L) >60 mL/min   GFR calc Af Amer 52 (L) >60 mL/min   Anion gap 12 5 - 15    Comment: Performed at [facility], [address], [place], [place] [address]  Glucose, capillary     Status: Abnormal   Collection Time: 03/08/18  7:55 AM  Result Value Ref Range   Glucose-Capillary 139 (H) 70 - 99 mg/dL   Comment 1 Notify RN    Comment  2 Document in Chart   Glucose, capillary     Status: Abnormal   Collection Time: 03/08/18 11:45 AM  Result Value Ref Range   Glucose-Capillary 164 (H) 70 - 99 mg/dL   Comment 1 Notify RN    Comment 2 Document in Chart   Glucose, capillary     Status: Abnormal   Collection Time: 03/08/18  5:07 PM  Result Value Ref Range   Glucose-Capillary 267 (H) 70 - 99 mg/dL   Comment 1 Notify RN    Comment 2 Document in Chart   Glucose, capillary     Status: Abnormal   Collection Time: 03/08/18  9:39 PM  Result Value Ref Range   Glucose-Capillary 174 (H) 70 - 99 mg/dL   Comment 1 Notify RN    Comment 2 Document in Chart   Basic metabolic panel     Status: Abnormal   Collection Time: 03/09/18  5:32 AM  Result Value Ref Range   Sodium 136 135 - 145 mmol/L   Potassium 3.4 (L) 3.5 - 5.1 mmol/L   Chloride 101 98 - 111 mmol/L   CO2 24 22 - 32 mmol/L   Glucose, Bld 159 (H) 70 - 99 mg/dL   BUN 18 8 - 23 mg/dL   Creatinine, Ser 1.28 (H) 0.44 - 1.00 mg/dL   Calcium 8.5 (L) 8.9 - 10.3 mg/dL   GFR calc non Af Amer 43 (L) >60 mL/min   GFR calc Af Amer 49 (L)  >60 mL/min   Anion gap 11 5 - 15    Comment: Performed at [facility] [address] [place], [place] [address]  Magnesium     Status: Abnormal   Collection Time: 03/09/18  5:32 AM  Result Value Ref Range   Magnesium 1.5 (L) 1.7 - 2.4 mg/dL    Comment: Performed at [place] [facility] [address] [place], [place] [address]  Glucose, capillary     Status: Abnormal   Collection Time: 03/09/18  6:21 AM  Result Value Ref Range   Glucose-Capillary 153 (H) 70 - 99 mg/dL   Comment 1 Notify RN    Comment 2 Document in Chart   Glucose, capillary     Status: Abnormal   Collection Time: 03/09/18 12:38 PM  Result Value Ref Range   Glucose-Capillary 157 (H) 70 - 99 mg/dL   No results found.     Medical Problem List and Plan: 1.  Left side weakness secondary to acute right MCA, MCA/PCA and left punctate MCA infarctions 2.  DVT Prophylaxis/Anticoagulation: SCDs. Monitor for any signs of DVT 3. Pain Management:  Tylenol as needed 4. Mood:  Celexa 10 mg daily. Provide emotional support 5. Neuropsych: This patient is capable of making decisions on her own behalf. 6. Skin/Wound Care:  Routine skin checks 7. Fluids/Electrolytes/Nutrition:  Routine in and out's with follow-up chemistries 8.  Left ureteral stone with hydronephrosis. Status post stenting. Follow-up urology services [facility] 9. CAD/CABG. Continue aspirin and Plavix 10. Hypertension. Lisinopril 5 mg daily, Coreg 12.5 mg twice a day, Norvasc 10 mg daily. Monitor with increased mobility 11. Diabetes mellitus.Hemoglobin A1c 6.0.SSI. Check blood sugars before meals and at bedtime.patient on Glucotrol 10 mg twice a day and Glucophage 1000 mg twice a day prior to admission. Resume as needed 12. AKI. Follow-up chemistries 13. Fever with leukocytosis. Blood cultures no growth to date. Complete course of OMNICEF. 14. Hyperlipidemia. Lipitor 15.  Hypothyroidism. Synthroid  Post Admission Physician  Evaluation: 1. Preadmission  assessment reviewed and changes made below. 2. Functional deficits secondary  to acute right MCA, MCA/PCA and left punctate MCA infarctions. 3. Patient is admitted to receive collaborative, interdisciplinary care between the physiatrist, rehab nursing staff, and therapy team. 4. Patient's level of medical complexity and substantial therapy needs in context of that medical necessity cannot be provided at a lesser intensity of care such as a SNF. 5. Patient has experienced substantial functional loss from his/her baseline which was documented above under the "Functional History" and "Functional Status" headings.  Judging by the patient's diagnosis, physical exam, and functional history, the patient has potential for functional progress which will result in measurable gains while on inpatient rehab.  These gains will be of substantial and practical use upon discharge  in facilitating mobility and self-care at the household level. 4. Physiatrist will provide 24 hour management of medical needs as well as oversight of the therapy plan/treatment and provide guidance as appropriate regarding the interaction of the two. 7. 24 hour rehab nursing will assist with bladder management, safety, disease management, medication administration and patient education  and help integrate therapy concepts, techniques,education, etc. 8. PT will assess and treat for/with: Lower extremity strength, range of motion, stamina, balance, functional mobility, safety, adaptive techniques and equipment, wound care, coping skills, pain control, education. Goals are: Supervision. 9. OT will assess and treat for/with: ADL's, functional mobility, safety, upper extremity strength, adaptive techniques and equipment, wound mgt, ego support, and community reintegration.   Goals are: Supervision. Therapy may proceed with showering this patient. 10. SLP will assess and treat for/with: cognition.  Goals are:  Supervision. 11. Case Management and Social Worker will assess and treat for psychological issues and discharge planning. 12. Team conference will be held weekly to assess progress toward goals and to determine barriers to discharge. 13. Patient will receive at least 3 hours of therapy per day at least 5 days per week. 14. ELOS: 12-14 days.       15. Prognosis:  good  I have personally performed a face to face diagnostic evaluation, including, but not limited to relevant history and physical exam findings, of this patient and developed relevant assessment and plan.  Additionally, I have reviewed and concur with the physician assistant's documentation above.  The patient's status has not changed. The original post admission physician evaluation remains appropriate, and any changes from the pre-admission screening or documentation from the acute chart are noted above.    Delice Lesch, MD, ABPMR Lavon Paganini Angiulli, PA-C 03/09/2018

## 2018-03-09 NOTE — Discharge Summary (Signed)
Physician Discharge Summary  Theresa Ray QPY:[id number] DOB: [birth year] DOA: 03/02/2018  PCP: Ronita Hipps, MD  Admit date: 03/02/2018 Discharge date: 03/09/2018  Admitted From: Home Disposition:  CIR  Discharge Condition:Stable CODE STATUS:FULL Diet recommendation: Heart Healthy  Brief/Interim Summary:  Patient is a 72 year old female with past medical history of chronic diastolic CHF, TIAs, diabetes, hypertension, CAD status post CABG x3, postoperative A. fib who was transferred from [facility].  She initially presented to [facility] with altered mental status, left-sided weakness, severe abdominal pain.  Further work-up revealed left ureteral obstruction due to calculus.  Urology was consulted and she underwent double-J stent placement there.  She was transferred to here under ICU service for altered mental status.  MRI showed small multifocal infarcts concerning for embolic stroke.  Neurology, cardiology following.  Underwent TEE and loop recorder placement. Patient transferred to hospitalist service on 03/05/18. She is waiting for bed in CIR.She is medically stable for discharge.  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   Blood cultures negative so far. Currently she is hemodynamically stable.No fever.Urine culture did not show any growth.  Changed antibiotics to oral.  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  She was found to have 6 mm left upper ureteral stone with hydronephrosis and underwent stent placement.  Acute CVA: MRI showed multifocal infarcts.  Suggestive of embolic process.  Plan is to rule  out endocarditis versus atrial fibrillation.  TTE showed mild reduction in EF to 50 to 55%, hypokinesis of the inferior lateral and inferior myocardium, mild to moderate MR.  TEE did not show any intracardiac source of emboli, negative bubble  study, no PFO for ASD.  Neurology was following.  She underwent  loop recorder placement to assess for occult A. Fib on 03/06/18 by EP. Currently she is on aspirin, Plavix and statin. She has  weakness on the left side. EEG did not show any seizure activity. Imagings:MRI: Acute right MCA and punctate left MCA infarcts, chronic bilateral BG infarcts. CT head and neck: no LVO, moderate stenosis right V4, left A2, bilateral M2/M3 and left P2  Occult Afib?:She also has history of postop A. fib after bypass surgery November 2018.  She was also on amiodarone for a while.  30-day event monitoring on October 2019 did not show any atrial fibrillation.  No A. fib noted on telemetry so far. Underwent  loop recorder placement .  She follows with Dr. Angelena Form as outpatient.  History of chronic diastolic CHF: Echocardiogram done here showed mildy  reduced ejection fraction.  Chest x-ray yesterday showed possible pulmonary edema.  Ordered a dose of Lasix IV 60 mg on 03/05/18  which much improvement in the respiratory status .  Off oxygen.  Hypertension: Systolic hypertension.  Started on amlodipine.  Also on lisinopril and carvedilol.  History of coronary disease: Status post CABG in November 2018.  Continue aspirin, Plavix, statin and beta-blocker.  AKI: Creatinine at 1.28  Today.This might be her new baseline.  Check BMP in a week.  Diabetes type 2: Hemoglobin A1c of 6.  Continue home regimen on discharge.  Hyperlipidemia: LDL of 99.  Continue statin 40 mg daily.  Hypokalemia: Supplemented with potassium.Continue potassium and magnesium.  Deconditioning/debility/disposition: CIR following.  Discharge planning to CIR as soon as bed is available.   Discharge Diagnoses:  Active Problems:   Severe sepsis ([place])   Acute CVA (cerebrovascular accident) ([place])  Pyelonephritis   Chronic diastolic congestive heart failure ([place])   Diabetes mellitus type 2 in obese ([place])   Orthostasis   Tachypnea    Hypokalemia    Discharge Instructions  Discharge Instructions    Ambulatory referral to Neurology   Complete by:  As directed    Follow up with stroke clinic NP (Jessica Vanschaick or Cecille Rubin, if both not available, consider Zachery Dauer, or Ahern) at [facility] in about 4 weeks. Thanks.   Diet - low sodium heart healthy   Complete by:  As directed    Discharge instructions   Complete by:  As directed    1) Follow up with inpatient rehab. 2)Follow up with cardiology , neurology and urology as an outpatient. 3)Take prescribed medications as instructed. 4)Do CBC and BMP tests in 5 days.   Increase activity slowly   Complete by:  As directed      Allergies as of 03/09/2018      Reactions   Sulfa Antibiotics Nausea And Vomiting   Shellfish Allergy Other (See Comments)   UNKNOWN REACTION.  Allergy found on ALLERGY TEST.   Aggrenox [aspirin-dipyridamole Er] Other (See Comments)   Redness in eyes, takes aspirin 325 at home at baseline      Medication List    TAKE these medications   acetaminophen 325 MG tablet Commonly known as:  TYLENOL Take 2 tablets (650 mg total) by mouth every 6 (six) hours as needed for mild pain.   amLODipine 10 MG tablet Commonly known as:  NORVASC Take 1 tablet (10 mg total) by mouth daily. Start taking on:  03/10/2018   aspirin EC 81 MG tablet Take 1 tablet (81 mg total) by mouth daily.   atorvastatin 40 MG tablet Commonly known as:  LIPITOR Take 1 tablet (40 mg total) by mouth daily.   carvedilol 12.5 MG tablet Commonly known as:  COREG Take 1 tablet (12.5 mg total) by mouth 2 (two) times daily.   cefdinir 300 MG capsule Commonly known as:  OMNICEF Take 1 capsule (300 mg total) by mouth every 12 (twelve) hours. Last day of antibiotics is tomorrow   cholecalciferol 1000 units tablet Commonly known as:  VITAMIN D Take 3,000 Units 2 (two) times daily by mouth.   CLEAR EYES FOR DRY EYES 1-0.25 % Soln Generic drug:   Carboxymethylcellul-Glycerin Place 1 drop into both eyes as needed.   clopidogrel 75 MG tablet Commonly known as:  PLAVIX Take 1 tablet (75 mg total) by mouth daily.   co-enzyme Q-10 30 MG capsule Take 30 mg daily by mouth.   glipiZIDE 10 MG tablet Commonly known as:  GLUCOTROL Take 10 mg 2 (two) times daily by mouth.   GLUCOSAMINE CHONDR 500 COMPLEX PO Take 1 tablet 2 (two) times daily by mouth.   Krill Oil 1000 MG Caps Take 1,000 mg daily by mouth.   levothyroxine 175 MCG tablet Commonly known as:  SYNTHROID, LEVOTHROID Take 175 mcg daily before breakfast by mouth.   lisinopril 5 MG tablet Commonly known as:  PRINIVIL,ZESTRIL Take 1 tablet (5 mg total) by mouth daily.   magnesium oxide 400 (241.3 Mg) MG tablet Commonly known as:  MAG-OX Take 1 tablet (400 mg total) by mouth 2 (two) times daily. Start taking on:  03/10/2018   MELATONIN PO Take 10 mg at bedtime by mouth.   metFORMIN 1000 MG tablet Commonly known as:  GLUCOPHAGE Take 1,000 mg 2 (two) times daily by mouth.   multivitamin with minerals Tabs tablet  Take 1 tablet daily by mouth.   pantoprazole 40 MG tablet Commonly known as:  PROTONIX Take 1 tablet (40 mg total) by mouth daily. Start taking on:  03/10/2018   traMADol 50 MG tablet Commonly known as:  ULTRAM Take 50 mg by mouth 2 (two) times daily. For restless leg      Follow-up Information    [facility]. Schedule an appointment as soon as possible for a visit in 4 week(s).   Specialty:  Neurology Contact information: [place] [phone]       [place] [facility] Follow up.   Specialty:  Cardiology Why:  you will be called by Dr. Macky Lower scheduler to make a wound check appointment for your monitor implant site.  This should be done in the next 10 days Contact information: [address], [place] [address] [phone]          Allergies  Allergen Reactions  . Sulfa Antibiotics Nausea And Vomiting  . Shellfish Allergy Other (See Comments)    UNKNOWN REACTION.  Allergy found on ALLERGY TEST.  . Aggrenox [Aspirin-Dipyridamole Er] Other (See Comments)    Redness in eyes, takes aspirin 325 at home at baseline    Consultations: Neurology, cardiology, PCCM  Procedures/Studies: Dg Chest 1 View  Result Date: 03/05/2018 CLINICAL DATA:  Shortness of Breath EXAM: CHEST  1 VIEW COMPARISON:  03/02/2018 FINDINGS: Cardiac shadow is stable. Increased vascular congestion is noted with patchy bibasilar infiltrates and mild interstitial edema. No sizable effusion is seen. No bony abnormality is noted. IMPRESSION: Changes of mild CHF with bibasilar infiltrates identified. Electronically Signed   By: Inez Catalina M.D.   On: 03/05/2018 14:38   Ct Head Wo Contrast  Result Date: 03/02/2018 CLINICAL DATA:  72 year old female with altered mental status. EXAM: CT HEAD WITHOUT CONTRAST TECHNIQUE: Contiguous axial images were obtained from the base of the skull through the vertex without intravenous contrast. COMPARISON:  Head CT dated 03/02/2018 FINDINGS: Brain: There is mild age-related atrophy. Moderate to advanced periventricular and deep white matter hypodensities most consistent with chronic microvascular ischemic changes and similar to prior CT. Left basal ganglia old lacunar infarct. There is no acute intracranial hemorrhage. No mass effect or midline shift. No extra-axial fluid collection. Vascular: No hyperdense vessel or unexpected calcification. Skull: Normal. Negative for fracture or focal lesion. Sinuses/Orbits: Mild mucoperiosteal thickening of paranasal sinuses. No air-fluid levels. The mastoid air cells are clear. Other: None IMPRESSION: 1. No acute intracranial hemorrhage. 2. Age-related atrophy and chronic microvascular ischemic changes. Electronically Signed   By: Anner Crete M.D.   On: 03/02/2018 23:09   Mr  Brain Wo Contrast  Result Date: 03/03/2018 CLINICAL DATA:  Altered mental status.  LEFT-sided weakness. EXAM: MRI HEAD WITHOUT CONTRAST TECHNIQUE: Multiplanar, multiecho pulse sequences of the brain and surrounding structures were obtained without intravenous contrast. COMPARISON:  CT HEAD March 02, 2018 and MRI head May 14, 2012. FINDINGS: INTRACRANIAL CONTENTS: Subcentimeter foci reduced diffusion LEFT frontal, RIGHT parietal and bilateral temporal lobes with low ADC values. Small areas of confluent reduced diffusion RIGHT parietal and occipital lobes with corresponding low ADC values. Susceptibility artifact associated with bilateral old basal ganglia infarcts. Old small RIGHT inferior cerebellar infarct. No susceptibility artifact to suggest interval hemorrhage. Confluent supratentorial white matter FLAIR T2 hyperintensities. Mild ex vacuo dilatation bilateral lateral ventricles. No hydrocephalus. No abnormal extra-axial fluid collections. VASCULAR: Normal major intracranial vascular flow voids present at  skull base. SKULL AND UPPER CERVICAL SPINE: No abnormal sellar expansion. No suspicious calvarial bone marrow signal. Craniocervical junction maintained. SINUSES/ORBITS: The mastoid air-cells and included paranasal sinuses are well-aerated.The included ocular globes and orbital contents are non-suspicious. OTHER: None. IMPRESSION: 1. Multifocal acute bilateral small infarcts spanning multiple vascular territories most compatible with embolic phenomena. 2. Moderate to severe chronic small vessel ischemic changes. 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 Critical Value/emergent results text paged to [place] via AMION secure system on 03/03/2018 at 5:57 am, including interpreting physician's phone number. Electronically Signed   By: Elon Alas M.D.   On: 03/03/2018 05:58      Subjective: Patient seen and examined the bedside this morning.  Remains hemodynamically stable.   Stable for discharge to CIR today.  Discharge Exam: Vitals:   03/09/18 0727 03/09/18 1152  BP: (!) 156/71 140/62  Pulse: 69 62  Resp: 16 16  Temp: 98.9 F (37.2 C) 98.9 F (37.2 C)  SpO2: 100% 94%   Vitals:   03/08/18 2350 03/09/18 0400 03/09/18 0727 03/09/18 1152  BP: (!) 161/71 (!) 158/67 (!) 156/71 140/62  Pulse: 66 60 69 62  Resp: 17 20 16 16   Temp: 98.1 F (36.7 C) 98 F (36.7 C) 98.9 F (37.2 C) 98.9 F (37.2 C)  TempSrc: Oral Oral Oral Oral  SpO2: 97% 93% 100% 94%  Weight:  88.6 kg    Height:        General: Pt is alert, awake, not in acute distress Cardiovascular: RRR, S1/S2 +, no rubs, no gallops Respiratory: CTA bilaterally, no wheezing, no rhonchi Abdominal: Soft, NT, ND, bowel sounds + Extremities: no edema, no cyanosis    The results of significant diagnostics from this hospitalization (including imaging, microbiology, ancillary and laboratory) are listed below for reference.     Microbiology: Recent Results (from the past 240 hour(s))  MRSA PCR Screening     Status: None   Collection Time: 03/02/18  9:53 PM  Result Value Ref Range Status   MRSA by PCR NEGATIVE NEGATIVE Final    Comment:        The GeneXpert MRSA Assay (FDA approved for NASAL specimens only), is one component of a comprehensive MRSA colonization surveillance program. It is not intended to diagnose MRSA infection nor to guide or monitor treatment for MRSA infections. Performed at [facility], [place] [address], [place], [place] [address]   Culture, blood (Routine X 2) w Reflex to ID Panel     Status: None   Collection Time: 03/03/18 10:46 AM  Result Value Ref Range Status   Specimen Description BLOOD LEFT HAND  Final   Special Requests   Final    BOTTLES DRAWN AEROBIC ONLY Blood Culture adequate volume   Culture   Final    NO GROWTH 5 DAYS Performed at [facility], [place] [address], [place], [place] [address]    Report Status 03/08/2018 FINAL  Final  Culture,  blood (Routine X 2) w Reflex to ID Panel     Status: None   Collection Time: 03/03/18 10:46 AM  Result Value Ref Range Status   Specimen Description BLOOD RIGHT HAND  Final   Special Requests   Final    BOTTLES DRAWN AEROBIC ONLY Blood Culture adequate volume   Culture   Final    NO GROWTH 5 DAYS Performed at [facility], [place] [address], [place], [place] [address]    Report Status 03/08/2018 FINAL  Final  Culture, Urine     Status: None   Collection Time: 03/05/18 11:55 AM  Result Value Ref Range Status   Specimen Description URINE, CATHETERIZED  Final   Special Requests NONE  Final   Culture   Final    NO GROWTH Performed at [facility], [address], [place], [place] [address]    Report Status 03/06/2018 FINAL  Final     Labs: BNP (last 3 results) No results for input(s): BNP in the last 8760 hours. Basic Metabolic Panel: Recent Labs  Lab 03/05/18 0403 03/06/18 0441 03/07/18 0519 03/08/18 0544 03/09/18 0532  NA 134* 139 136 133* 136  K 3.1* 3.2* 3.7 3.2* 3.4*  CL 100 103 101 98 101  CO2 20* 23 21* 23 24  GLUCOSE 152* 137* 114* 171* 159*  BUN 24* 24* 23 21 18   CREATININE 1.44* 1.62* 1.46* 1.23* 1.28*  CALCIUM 8.2* 8.4* 8.2* 8.3* 8.5*  MG  --   --   --   --  1.5*   Liver Function Tests: Recent Labs  Lab 03/02/18 2302  AST 32  ALT 19  ALKPHOS 41  BILITOT 0.9  PROT 6.9  ALBUMIN 3.3*   No results for input(s): LIPASE, AMYLASE in the last 168 hours. No results for input(s): AMMONIA in the last 168 hours. CBC: Recent Labs  Lab 03/02/18 2302 03/03/18 0154 03/04/18 0725 03/05/18 0403 03/06/18 0441 03/07/18 0610  WBC 22.6* 19.3* 16.3* 16.0* 12.0* 12.3*  NEUTROABS 20.1*  --   --   --   --   --   HGB 13.9 13.2 11.6* 11.0* 11.4* 11.6*  HCT 41.5 39.2 34.0* 32.9* 33.8* 33.5*  MCV 90.8 90.3 91.4 90.9 90.4 89.1  PLT 352 327 261 325 345 344   Cardiac Enzymes: No results for input(s): CKTOTAL, CKMB, CKMBINDEX, TROPONINI in the last 168  hours. BNP: Invalid input(s): POCBNP CBG: Recent Labs  Lab 03/08/18 1145 03/08/18 1707 03/08/18 2139 03/09/18 0621 03/09/18 1238  GLUCAP 164* 267* 174* 153* 157*   D-Dimer No results for input(s): DDIMER in the last 72 hours. Hgb A1c No results for input(s): HGBA1C in the last 72 hours. Lipid Profile No results for input(s): CHOL, HDL, LDLCALC, TRIG, CHOLHDL, LDLDIRECT in the last 72 hours. Thyroid function studies No results for input(s): TSH, T4TOTAL, T3FREE, THYROIDAB in the last 72 hours.  Invalid input(s): FREET3 Anemia work up No results for input(s): VITAMINB12, FOLATE, FERRITIN, TIBC, IRON, RETICCTPCT in the last 72 hours. Urinalysis    Component Value Date/Time   COLORURINE STRAW (A) 02/19/2017 2231   APPEARANCEUR CLEAR 02/19/2017 2231   LABSPEC 1.010 02/19/2017 2231   PHURINE 7.0 02/19/2017 2231   GLUCOSEU 150 (A) 02/19/2017 2231   HGBUR NEGATIVE 02/19/2017 2231   BILIRUBINUR NEGATIVE 02/19/2017 2231   KETONESUR NEGATIVE 02/19/2017 2231   PROTEINUR NEGATIVE 02/19/2017 2231   NITRITE NEGATIVE 02/19/2017 2231   LEUKOCYTESUR NEGATIVE 02/19/2017 2231   Sepsis Labs Invalid input(s): PROCALCITONIN,  WBC,  LACTICIDVEN Microbiology Recent Results (from the past 240 hour(s))  MRSA PCR Screening     Status: None   Collection Time: 03/02/18  9:53 PM  Result Value Ref Range Status   MRSA by PCR NEGATIVE NEGATIVE Final    Comment:        The GeneXpert MRSA Assay (FDA approved for NASAL specimens only), is one component of a comprehensive MRSA colonization surveillance program. It is not intended to diagnose MRSA infection nor to guide or monitor treatment for MRSA  infections. Performed at [facility], [place] [address], [place], [place] [address]   Culture, blood (Routine X 2) w Reflex to ID Panel     Status: None   Collection Time: 03/03/18 10:46 AM  Result Value Ref Range Status   Specimen Description BLOOD LEFT HAND  Final   Special Requests   Final     BOTTLES DRAWN AEROBIC ONLY Blood Culture adequate volume   Culture   Final    NO GROWTH 5 DAYS Performed at [facility], [place] [address], [place], [place] [address]    Report Status 03/08/2018 FINAL  Final  Culture, blood (Routine X 2) w Reflex to ID Panel     Status: None   Collection Time: 03/03/18 10:46 AM  Result Value Ref Range Status   Specimen Description BLOOD RIGHT HAND  Final   Special Requests   Final    BOTTLES DRAWN AEROBIC ONLY Blood Culture adequate volume   Culture   Final    NO GROWTH 5 DAYS Performed at [facility], [place] [address] [place], [place] [address]    Report Status 03/08/2018 FINAL  Final  Culture, Urine     Status: None   Collection Time: 03/05/18 11:55 AM  Result Value Ref Range Status   Specimen Description URINE, CATHETERIZED  Final   Special Requests NONE  Final   Culture   Final    NO GROWTH Performed at [facility], [place] [address] [place], [place] [address]    Report Status 03/06/2018 FINAL  Final    Please note: You were cared for by a hospitalist during your hospital stay. Once you are discharged, your primary care physician will handle any further medical issues. Please note that NO REFILLS for any discharge medications will be authorized once you are discharged, as it is imperative that you return to your primary care physician (or establish a relationship with a primary care physician if you do not have one) for your post hospital discharge needs so that they can reassess your need for medications and monitor your lab values.    Time coordinating discharge: 40 minutes  SIGNED:   Shelly Coss, MD  [facility] 03/09/2018, 4:02 PM Pager [phone]  If 7PM-7AM, please contact night-coverage [email or web address] Password [email or web address]

## 2018-03-09 NOTE — Progress Notes (Signed)
Physical Medicine and Rehabilitation Consult Reason for Consult: Left side weakness Referring Physician: triad   HPI: Theresa Ray is a 72 y.o.right-handed female with history of CAD/CABG 2018 maintained on aspirin and Plavix, chronic diastolic congestive heart failure, diabetes mellitus,  orthostatic hypotension. Per chart review, patient, and husband, patient lives with spouse. Independent prior to admission. One level home with 6-9 steps to entry. Husband still works. Presented 03/02/2018 with altered mental status of left side weakness to [facility] showed no large vessel occlusion.Patient did not receive TPA. Noted leukocytosis as well as creatinine 1.87 with CT of abdomen being completed showing left urteric obstruction secondary to kidney stone was taken to the OR for a double-J stent. Patient then transferred to [facility] for further evaluation of left side weakness. MRI reviewed, showing bilateral infarcts.  Per report, CT/MRI completed showing multifocal acute bilateral small infarct spanning multiple vascular territories. 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 Echocardiogram with ejection fraction of 55%. Systolic function was normal. TEE is pending. Neurology follow-up  Patient currently remains on aspirin and Plavix for CVA prophylaxis. Plans discussed with Cards. Therapy evaluations completed with recommendations of physical medicine rehabilitation consult.   Review of Systems  Constitutional: Negative for chills and fever.  HENT: Negative for hearing loss.   Eyes: Positive for blurred vision. Negative for double vision.  Respiratory: Negative for cough and shortness of breath.   Cardiovascular: Positive for leg swelling. Negative for chest pain and palpitations.  Gastrointestinal: Positive for constipation. Negative for nausea and vomiting.  Genitourinary: Negative for dysuria, flank pain and  hematuria.  Musculoskeletal: Positive for joint pain and myalgias.  Skin: Negative for rash.  Neurological: Positive for focal weakness. Negative for sensory change and speech change.  All other systems reviewed and are negative.      Past Medical History:  Diagnosis Date  . CAD in native artery    a. s/p CABGx3 in 01/2017.  Marland Kitchen Chronic diastolic CHF (congestive heart failure) ([place])   . Diabetes mellitus ([place])   . Diabetic neuropathy ([place])   . History of kidney stones    history of stones  . Hypertension   . Hypothyroidism   . NSTEMI (non-ST elevated myocardial infarction) ([place])   . Orthostatic hypotension   . Postoperative atrial fibrillation ([place])    a. after CABG 01/2017.  Marland Kitchen TIA (transient ischemic attack)         Past Surgical History:  Procedure Laterality Date  . ABDOMINAL HYSTERECTOMY    . CORONARY ARTERY BYPASS GRAFT N/A 02/24/2017   Procedure: CORONARY ARTERY BYPASS GRAFTING (CABG) USING LEFT INTERNAL MAMMARY ARTERY TO LAD AND ENDOSCOPICALLY HARVESTED GREATER SAPHENOUS VEIN TO PDA AND TO OM1.;  Surgeon: Grace Isaac, MD;  Location: [place];  Service: Open Heart Surgery;  Laterality: N/A;  . ENDOVEIN HARVEST OF GREATER SAPHENOUS VEIN Right 02/24/2017   Procedure: ENDOVEIN HARVEST OF GREATER SAPHENOUS VEIN;  Surgeon: Grace Isaac, MD;  Location: [place];  Service: Open Heart Surgery;  Laterality: Right;  . LAPAROSCOPIC GASTRIC BANDING    . LEFT HEART CATH AND CORONARY ANGIOGRAPHY N/A 02/18/2017   Procedure: LEFT HEART CATH AND CORONARY ANGIOGRAPHY;  Surgeon: Leonie Man, MD;  Location: [place] [facility];  Service: Cardiovascular;  Laterality: N/A;  . TEE WITHOUT CARDIOVERSION N/A 02/24/2017   Procedure: TRANSESOPHAGEAL ECHOCARDIOGRAM (TEE);  Surgeon: Grace Isaac, MD;  Location: [place];  Service: Open Heart Surgery;  Laterality:  N/A;        Family History  Problem Relation Age of Onset  . Heart disease Mother 68       CABG    . CVA Father    Social History:  reports that she quit smoking about 44 years ago. Her smoking use included cigarettes. She started smoking about 52 years ago. She has never used smokeless tobacco. She reports that she does not drink alcohol or use drugs. Allergies:       Allergies  Allergen Reactions  . Sulfa Antibiotics Nausea And Vomiting  . Shellfish Allergy Other (See Comments)    UNKNOWN REACTION.  Allergy found on ALLERGY TEST.  . Aggrenox [Aspirin-Dipyridamole Er] Other (See Comments)    Redness in eyes, takes aspirin 325 at home at baseline         Medications Prior to Admission  Medication Sig Dispense Refill  . acetaminophen (TYLENOL) 325 MG tablet Take 2 tablets (650 mg total) by mouth every 6 (six) hours as needed for mild pain.    Marland Kitchen aspirin EC 81 MG tablet Take 1 tablet (81 mg total) by mouth daily. 90 tablet 3  . atorvastatin (LIPITOR) 40 MG tablet Take 1 tablet (40 mg total) by mouth daily. 90 tablet 3  . Carboxymethylcellul-Glycerin (CLEAR EYES FOR DRY EYES) 1-0.25 % SOLN Place 1 drop into both eyes as needed.    . carvedilol (COREG) 12.5 MG tablet Take 1 tablet (12.5 mg total) by mouth 2 (two) times daily. 60 tablet 11  . cholecalciferol (VITAMIN D) 1000 units tablet Take 3,000 Units 2 (two) times daily by mouth.    . clopidogrel (PLAVIX) 75 MG tablet Take 1 tablet (75 mg total) by mouth daily. 90 tablet 3  . co-enzyme Q-10 30 MG capsule Take 30 mg daily by mouth.    Marland Kitchen glipiZIDE (GLUCOTROL) 10 MG tablet Take 10 mg 2 (two) times daily by mouth.    . Glucosamine-Chondroit-Vit C-Mn (GLUCOSAMINE CHONDR 500 COMPLEX PO) Take 1 tablet 2 (two) times daily by mouth.    Javier Docker Oil 1000 MG CAPS Take 1,000 mg daily by mouth.    . levothyroxine (SYNTHROID, LEVOTHROID) 175 MCG tablet Take 175 mcg daily before breakfast by mouth.    Marland Kitchen lisinopril (PRINIVIL,ZESTRIL) 5 MG tablet Take 1 tablet (5 mg total) by mouth daily. 30 tablet 3  . MELATONIN PO Take 10 mg  at bedtime by mouth.    . metFORMIN (GLUCOPHAGE) 1000 MG tablet Take 1,000 mg 2 (two) times daily by mouth.    . Multiple Vitamin (MULTIVITAMIN WITH MINERALS) TABS tablet Take 1 tablet daily by mouth.    . traMADol (ULTRAM) 50 MG tablet Take 50 mg by mouth 2 (two) times daily. For restless leg  1    Home: Home Living Family/patient expects to be discharged to:: Private residence Living Arrangements: Spouse/significant other Available Help at Discharge: Family, Available 24 hours/day Type of Home: House Home Access: Stairs to enter [employer] of Steps: 6-9 Entrance Stairs-Rails: Can reach both Home Layout: One level Bathroom Shower/Tub: Chiropodist: Standard Home Equipment: Environmental consultant - 2 wheels, Bedside commode, Hand held shower head  Functional History: Prior Function Level of Independence: Independent Comments: independent with mobility and ADLs, no IADLs (some driving)  Functional Status:  Mobility: Bed Mobility Overal bed mobility: Needs Assistance Bed Mobility: Supine to Sit Rolling: Total assist, +2 for physical assistance Sidelying to sit: Total assist, +2 for physical assistance Supine to sit: Mod assist Sit to  sidelying: Total assist, +2 for physical assistance General bed mobility comments: max directional verbal cues to complete task step by step, pt initiated moving LEs off EOB, modA for trunk elevation and to bring hips to EOB Transfers Overall transfer level: Needs assistance Equipment used: 2 person hand held assist Transfers: Sit to/from Stand Sit to Stand: Mod assist, +2 physical assistance General transfer comment: pt assisted to floor due to high surface height, max tactile and verbal cues to achieve trunk extension and full upright posture Ambulation/Gait Ambulation/Gait assistance: Mod assist, +2 safety/equipment Gait Distance (Feet): 150 Feet Assistive device: Rolling walker (2 wheeled) Gait Pattern/deviations:  Step-through pattern, Decreased stride length, Decreased dorsiflexion - left General Gait Details: max directional verbal cues, pt with R sided gaze preferance requiring max directional verbal cues to turn to the left and look out for obstacles on the L. modA for walker management, modA for forward progression Gait velocity: slow Gait velocity interpretation: <1.8 ft/sec, indicate of risk for recurrent falls  ADL: ADL Overall ADL's : Needs assistance/impaired Grooming: Wash/dry face, Minimal assistance, Bed level Upper Body Bathing: Maximal assistance, Bed level Lower Body Bathing: Total assistance, +2 for safety/equipment, +2 for physical assistance, Sit to/from stand Upper Body Dressing : Maximal assistance, Sitting Lower Body Dressing: Total assistance, +2 for physical assistance, Sit to/from stand Toilet Transfer Details (indicate cue type and reason): deferred Functional mobility during ADLs: Total assistance, +2 for physical assistance General ADL Comments: patient significantly limited by cognition, L inattention/neglect and decreased activity tolerance  Cognition: Cognition Overall Cognitive Status: Impaired/Different from baseline Arousal/Alertness: Awake/alert Orientation Level: Oriented to person, Disoriented to place, Disoriented to time, Disoriented to situation Attention: Focused Focused Attention: Impaired Focused Attention Impairment: Verbal basic, Functional basic Memory: Impaired Memory Impairment: Storage deficit Awareness: Impaired Awareness Impairment: Intellectual impairment Problem Solving: Impaired Problem Solving Impairment: Functional basic Executive Function: (all impaired due to lower level deficits) Safety/Judgment: Impaired Comments: left inattention  Cognition Arousal/Alertness: Awake/alert Behavior During Therapy: Flat affect Overall Cognitive Status: Impaired/Different from baseline Area of Impairment: Orientation, Attention, Memory, Following  commands, Safety/judgement, Awareness, Problem solving Orientation Level: (pt oriented to hospital and asked when she had the stroke) Current Attention Level: Sustained Memory: Decreased short-term memory Following Commands: Follows one step commands with increased time Safety/Judgement: Decreased awareness of safety, Decreased awareness of deficits Awareness: Intellectual Problem Solving: Slow processing, Decreased initiation, Difficulty sequencing, Requires verbal cues, Requires tactile cues General Comments: inconsistently following commands   Blood pressure (!) 145/75, pulse 64, temperature 98.5 F (36.9 C), temperature source Oral, resp. rate 19, height 5\' 5"  (1.651 m), weight 85.6 kg, SpO2 95 %. Physical Exam  Vitals reviewed. Constitutional: She is oriented to person, place, and time. She appears well-developed.  Obese  HENT:  Head: Normocephalic and atraumatic.  Eyes: EOM are normal. Right eye exhibits no discharge. Left eye exhibits no discharge.  Pupils reactive to light  Neck: Normal range of motion. Neck supple. No thyromegaly present.  Cardiovascular: Normal rate and regular rhythm.  Respiratory: Effort normal and breath sounds normal. No respiratory distress.  GI: Soft. Bowel sounds are normal. She exhibits no distension.  Musculoskeletal:  No edema or tenderness in extremities  Neurological: She is alert and oriented to person, place, and time.  Follows simple commands. Appears to be easily distracted needs to be redirected. She does provide her name and age. Motor: LUE/LLE: 4+/5 proximal to distal RUE/RLE: 4/5 proximal to distal  Skin: Skin is warm and dry.  Psychiatric: Her affect is blunt. Her  speech is delayed. She is slowed.    LabResultsLast24Hours       Results for orders placed or performed during the hospital encounter of 03/02/18 (from the past 24 hour(s))  Lactic acid, plasma     Status: Abnormal   Collection Time: 03/03/18  1:47 PM  Result  Value Ref Range   Lactic Acid, Venous 2.0 (HH) 0.5 - 1.9 mmol/L  Glucose, capillary     Status: Abnormal   Collection Time: 03/03/18  3:42 PM  Result Value Ref Range   Glucose-Capillary 155 (H) 70 - 99 mg/dL   Comment 1 Notify RN    Comment 2 Document in Chart   Glucose, capillary     Status: Abnormal   Collection Time: 03/03/18  7:34 PM  Result Value Ref Range   Glucose-Capillary 108 (H) 70 - 99 mg/dL  Glucose, capillary     Status: Abnormal   Collection Time: 03/03/18 11:15 PM  Result Value Ref Range   Glucose-Capillary 132 (H) 70 - 99 mg/dL  Glucose, capillary     Status: Abnormal   Collection Time: 03/04/18  3:31 AM  Result Value Ref Range   Glucose-Capillary 118 (H) 70 - 99 mg/dL  CBC     Status: Abnormal   Collection Time: 03/04/18  7:25 AM  Result Value Ref Range   WBC 16.3 (H) 4.0 - 10.5 K/uL   RBC 3.72 (L) 3.87 - 5.11 MIL/uL   Hemoglobin 11.6 (L) 12.0 - 15.0 g/dL   HCT 34.0 (L) 36.0 - 46.0 %   MCV 91.4 80.0 - 100.0 fL   MCH 31.2 26.0 - 34.0 pg   MCHC 34.1 30.0 - 36.0 g/dL   RDW 12.6 11.5 - 15.5 %   Platelets 261 150 - 400 K/uL   nRBC 0.0 0.0 - 0.2 %  Basic metabolic panel     Status: Abnormal   Collection Time: 03/04/18  7:25 AM  Result Value Ref Range   Sodium 139 135 - 145 mmol/L   Potassium 3.3 (L) 3.5 - 5.1 mmol/L   Chloride 105 98 - 111 mmol/L   CO2 20 (L) 22 - 32 mmol/L   Glucose, Bld 144 (H) 70 - 99 mg/dL   BUN 21 8 - 23 mg/dL   Creatinine, Ser 1.37 (H) 0.44 - 1.00 mg/dL   Calcium 8.2 (L) 8.9 - 10.3 mg/dL   GFR calc non Af Amer 39 (L) >60 mL/min   GFR calc Af Amer 45 (L) >60 mL/min   Anion gap 14 5 - 15  Lactic acid, plasma     Status: None   Collection Time: 03/04/18  7:25 AM  Result Value Ref Range   Lactic Acid, Venous 1.1 0.5 - 1.9 mmol/L  Glucose, capillary     Status: Abnormal   Collection Time: 03/04/18  7:42 AM  Result Value Ref Range   Glucose-Capillary 140 (H) 70 - 99 mg/dL   Comment 1 Notify RN     Comment 2 Document in Chart   Glucose, capillary     Status: Abnormal   Collection Time: 03/04/18 11:14 AM  Result Value Ref Range   Glucose-Capillary 187 (H) 70 - 99 mg/dL   Comment 1 Notify RN    Comment 2 Document in Chart       ImagingResults(Last48hours)  Ct Head Wo Contrast  Result Date: 03/02/2018 CLINICAL DATA:  72 year old female with altered mental status. EXAM: CT HEAD WITHOUT CONTRAST TECHNIQUE: Contiguous axial images were obtained from the base of the skull  through the vertex without intravenous contrast. COMPARISON:  Head CT dated 03/02/2018 FINDINGS: Brain: There is mild age-related atrophy. Moderate to advanced periventricular and deep white matter hypodensities most consistent with chronic microvascular ischemic changes and similar to prior CT. Left basal ganglia old lacunar infarct. There is no acute intracranial hemorrhage. No mass effect or midline shift. No extra-axial fluid collection. Vascular: No hyperdense vessel or unexpected calcification. Skull: Normal. Negative for fracture or focal lesion. Sinuses/Orbits: Mild mucoperiosteal thickening of paranasal sinuses. No air-fluid levels. The mastoid air cells are clear. Other: None IMPRESSION: 1. No acute intracranial hemorrhage. 2. Age-related atrophy and chronic microvascular ischemic changes. Electronically Signed   By: Anner Crete M.D.   On: 03/02/2018 23:09   Mr Brain Wo Contrast  Result Date: 03/03/2018 CLINICAL DATA:  Altered mental status.  LEFT-sided weakness. EXAM: MRI HEAD WITHOUT CONTRAST TECHNIQUE: Multiplanar, multiecho pulse sequences of the brain and surrounding structures were obtained without intravenous contrast. COMPARISON:  CT HEAD March 02, 2018 and MRI head May 14, 2012. FINDINGS: INTRACRANIAL CONTENTS: Subcentimeter foci reduced diffusion LEFT frontal, RIGHT parietal and bilateral temporal lobes with low ADC values. Small areas of confluent reduced diffusion RIGHT parietal  and occipital lobes with corresponding low ADC values. Susceptibility artifact associated with bilateral old basal ganglia infarcts. Old small RIGHT inferior cerebellar infarct. No susceptibility artifact to suggest interval hemorrhage. Confluent supratentorial white matter FLAIR T2 hyperintensities. Mild ex vacuo dilatation bilateral lateral ventricles. No hydrocephalus. No abnormal extra-axial fluid collections. VASCULAR: Normal major intracranial vascular flow voids present at skull base. SKULL AND UPPER CERVICAL SPINE: No abnormal sellar expansion. No suspicious calvarial bone marrow signal. Craniocervical junction maintained. SINUSES/ORBITS: The mastoid air-cells and included paranasal sinuses are well-aerated.The included ocular globes and orbital contents are non-suspicious. OTHER: None. IMPRESSION: 1. Multifocal acute bilateral small infarcts spanning multiple vascular territories most compatible with embolic phenomena. 2. Moderate to severe chronic small vessel ischemic changes. 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 Critical Value/emergent results text paged to [place] via [employer] secure system on 03/03/2018 at 5:57 am, including interpreting physician's phone number. Electronically Signed   By: Elon Alas M.D.   On: 03/03/2018 05:58     Assessment/Plan: Diagnosis: Bilateral infarcts Labs and images (see above) independently reviewed.  Records reviewed and summated above. Stroke: Continue secondary stroke prophylaxis and Risk Factor Modification listed below:   Antiplatelet therapy:   Blood Pressure Management:  Continue current medication with prn's with permisive HTN per primary team Statin Agent:   Diabetes management:   Right sided hemiparesis: fit for orthosis to prevent contractures  Motor recovery: Fluoxetine  1. Does the need for close, 24 hr/day medical supervision in concert with the patient's rehab needs make it unreasonable for this patient to be served  in a less intensive setting? Yes  2. Co-Morbidities requiring supervision/potential complications: CAD/CABG 3825 (cont meds), chronic diastolic congestive heart failure (monitor signs/symptoms of fluid overload), DM (Monitor in accordance with exercise and adjust meds as necessary), orthostatic hypotension, tachypnea (monitor RR and O2 Sats with increased physical exertion), hypokalemia (continue to monitor and replete as necessary) 3. Due to safety, disease management and patient education, does the patient require 24 hr/day rehab nursing? Yes 4. Does the patient require coordinated care of a physician, rehab nurse, PT (1-2 hrs/day, 5 days/week), OT (1-2 hrs/day, 5 days/week) and SLP (1-2 hrs/day, 5 days/week) to address physical and functional deficits in the context of the above medical diagnosis(es)? Yes Addressing deficits in the following areas: balance, endurance,  locomotion, strength, transferring, bathing, dressing, toileting, cognition, speech, swallowing and psychosocial support 5. Can the patient actively participate in an intensive therapy program of at least 3 hrs of therapy per day at least 5 days per week? Potentially 6. The potential for patient to make measurable gains while on inpatient rehab is excellent 7. Anticipated functional outcomes upon discharge from inpatient rehab are supervision  with PT, supervision with OT, modified independent and supervision with SLP. 8. Estimated rehab length of stay to reach the above functional goals is: 15-18 days. 9. Anticipated D/C setting: Home 10. Anticipated post D/C treatments: HH therapy and Home excercise program 11. Overall Rehab/Functional Prognosis: good  RECOMMENDATIONS: This patient's condition is appropriate for continued rehabilitative care in the following setting: CIR when medically stable after completion of workup. Patient has agreed to participate in recommended program. Potentially Note that insurance prior authorization  may be required for reimbursement for recommended care.  Comment: Rehab Admissions Coordinator to follow up.   I have personally performed a face to face diagnostic evaluation, including, but not limited to relevant history and physical exam findings, of this patient and developed relevant assessment and plan.  Additionally, I have reviewed and concur with the physician assistant's documentation above.   Delice Lesch, MD, ABPMR Lavon Paganini Angiulli, PA-C 03/04/2018        Revision History                        Routing History

## 2018-03-09 NOTE — Plan of Care (Signed)
  Problem: Progression Outcomes Goal: Stroke/TIA educ information given/reviewed w/pt/family Outcome: Progressing   Problem: Progression Outcomes Goal: Complications of Ischemic Stroke will be minimized. Description Choose ONE based on patient diagnosis   Outcome: Progressing

## 2018-03-09 NOTE — Progress Notes (Signed)
PMR Admission Coordinator Pre-Admission Assessment  Patient: Theresa Ray is an 72 y.o., female MRN: [id number] DOB: February 29, 1946 Height: 5\' 5"  (165.1 cm) Weight: 88.6 kg                                                                                                                                                  Insurance Information HMO:     PPO: Yes     PCP:      IPA:      80/20:      OTHER:  PRIMARY: [employer] Medicare      Policy#: [id number]      Subscriber: patient CM Name: Beckie Busing      Phone#: [phone]     Fax#: [phone] Pre-Cert#: [id number]      Employer:  Josem Kaufmann [phone] by Beckie Busing on 03/09/18 for admit to CIR 03/09/18. Pt is approved 12/9-12/15, with next review date 12/16. Updates are due to [facility] via fax.  Benefits:  Phone #: NA     Name: Nav[employer] online portal Eff. Date: 04/01/17-03-31-18   Deduct: $0      Out of Pocket Max: $4,200 ($956.50)      Life Max:  CIR: $250/day, up to $1,500/admission (6 days)      SNF: $0/day for days 1-20, $164/day, for days 21-100 (100 day limit) Outpatient: per necessity     Co-Pay: $40/visit Home Health: 100%, per necessity      Co-Pay:  DME: 80%     Co-Pay: 20% Providers:  SECONDARY:      Policy#:       Subscriber:  CM Name:       Phone#:      Fax#:  Pre-Cert#:       Employer:  Benefits:  Phone #:      Name:  Eff. Date:      Deduct:       Out of Pocket Max:       Life Max:  CIR:       SNF:  Outpatient:      Co-Pay:  Home Health:       Co-Pay:  DME:      Co-Pay:   Medicaid Application Date:       Case Manager:  Disability Application Date:       Case Worker:   Emergency Contact Information         Contact Information    Name Relation Home Work [place]  [phone]       Current Medical History  Patient Admitting Diagnosis: Bilateral infarcts History of Present Illness: Theresa Ray is a 72 year old right-handed female with history of CAD/CABG 2018 maintained on aspirin and Plavix,  chronic diastolic congestive heart failure, diabetes mellitus, orthostatic hypotension. Per chart review, patient and husband, patient lives with spouse. Independent prior to admission. One level  home with 6-9 to entry. Husband works during the day. Presented 03/02/2018 with altered mental status and left side weakness to [place] showed no large vessel occlusion. Patient did not receive TPA. Noted leukocytosis as well as creatinine 1.87 with CT of abdomen being completed showing left urteric obstruction secondary to kidney stone was taken to the OR for a double-J stent. She was then transferred to [facility] for further evaluation of left-sided weakness. MRI reviewed, showing bilateral infarcts. Per report, CT/MRI follow-up while at [facility] showed multifocal acute bilateral small infarct spanning multiple vascular territories. 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 Echocardiogram with ejection fraction of 55%. Systolic function was normal. TEEwith no intracardiac source of emboli, negative bubble study without PFOand loop recorder placement 03/06/2018. Follow-up urology services after recent placement of stent for kidney stone Dr.Dahlstedt and continue plan of care with follow-up urology services at [facility] did spike a low-grade fever blood cultures no growth to date, lactic acid 4.2 down to 2.0 and placed on Rocephin empirically changed Omnicef x four days..Leukocytosis improving from 22,600-12,000. Presently remains on aspirin and Plavix for CVA prophylaxis.Therapy evaluations completed with recommendations of physical medicine rehabilitation consult. Patient is to be admitted for a comprehensive rehabilitation program on 03/09/18.    :Complete NIHSS TOTAL: 5  Past Medical History  Past Medical History:  Diagnosis Date  . CAD in native artery    a. s/p CABGx3 in 01/2017.  Marland Kitchen  Chronic diastolic CHF (congestive heart failure) ([place])   . Diabetes mellitus ([place])   . Diabetic neuropathy ([place])   . History of kidney stones    history of stones  . Hypertension   . Hypothyroidism   . NSTEMI (non-ST elevated myocardial infarction) ([place])   . Orthostatic hypotension   . Postoperative atrial fibrillation ([place])    a. after CABG 01/2017.  Marland Kitchen TIA (transient ischemic attack)     Family History  family history includes CVA in her father; Heart disease (age of onset: 68) in her mother.  Prior Rehab/Hospitalizations:  Has the patient had major surgery during 100 days prior to admission? No  Current Medications   Current Facility-Administered Medications:  .  acetaminophen (TYLENOL) tablet 500 mg, 500 mg, Oral, Q6H PRN, Blount, Xenia T, NP, 500 mg at 03/09/18 1542 .  amLODipine (NORVASC) tablet 10 mg, 10 mg, Oral, Daily, Dorothy Spark, MD, 10 mg at 03/09/18 1033 .  aspirin EC tablet 81 mg, 81 mg, Oral, Daily, Dorothy Spark, MD, 81 mg at 03/09/18 1033 .  atorvastatin (LIPITOR) tablet 40 mg, 40 mg, Oral, Daily, Dorothy Spark, MD, 40 mg at 03/09/18 1033 .  carvedilol (COREG) tablet 12.5 mg, 12.5 mg, Oral, BID WC, Dorothy Spark, MD, 12.5 mg at 03/09/18 1033 .  cefdinir (OMNICEF) capsule 300 mg, 300 mg, Oral, Q12H, Adhikari, Amrit, MD, 300 mg at 03/09/18 1034 .  chlorhexidine (PERIDEX) 0.12 % solution 15 mL, 15 mL, Mouth Rinse, BID, Dorothy Spark, MD, 15 mL at 03/09/18 1034 .  citalopram (CELEXA) tablet 10 mg, 10 mg, Oral, Daily, Dorothy Spark, MD, 10 mg at 03/09/18 1033 .  clopidogrel (PLAVIX) tablet 75 mg, 75 mg, Oral, Daily, Dorothy Spark, MD, 75 mg at 03/09/18 1033 .  insulin aspart (novoLOG) injection 0-5 Units, 0-5 Units, Subcutaneous, QHS, Adhikari, Amrit, MD .  insulin aspart (novoLOG) injection 0-9 Units, 0-9 Units, Subcutaneous, TID WC, Adhikari, Amrit, MD, 2 Units  at 03/09/18 1218 .  lactose free nutrition (BOOST PLUS)  liquid 237 mL, 237 mL, Oral, TID WC, Dorothy Spark, MD, 237 mL at 03/09/18 1219 .  levothyroxine (SYNTHROID, LEVOTHROID) tablet 175 mcg, 175 mcg, Oral, QAC breakfast, Dorothy Spark, MD, 175 mcg at 03/09/18 0601 .  lisinopril (PRINIVIL,ZESTRIL) tablet 5 mg, 5 mg, Oral, Daily, Dorothy Spark, MD, 5 mg at 03/09/18 1034 .  [START ON 03/10/2018] magnesium oxide (MAG-OX) tablet 400 mg, 400 mg, Oral, BID, Adhikari, Amrit, MD .  magnesium sulfate IVPB 2 g 50 mL, 2 g, Intravenous, Once, Adhikari, Amrit, MD .  multivitamin with minerals tablet 1 tablet, 1 tablet, Oral, Daily, Dorothy Spark, MD, 1 tablet at 03/09/18 1033 .  pantoprazole (PROTONIX) EC tablet 40 mg, 40 mg, Oral, Daily, Dorothy Spark, MD, 40 mg at 03/09/18 1034 .  potassium chloride SA (K-DUR,KLOR-CON) CR tablet 40 mEq, 40 mEq, Oral, Daily, Dorothy Spark, MD, 40 mEq at 03/09/18 1033 .  potassium chloride SA (K-DUR,KLOR-CON) CR tablet 40 mEq, 40 mEq, Oral, Once, Adhikari, Amrit, MD  Patients Current Diet:     Diet Order                  Diet - low sodium heart healthy         DIET DYS 3 Room service appropriate? 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 Was able to drive PTA  Home Assistive Devices / Equipment Home Assistive Devices/Equipment: Bedside commode/3-in-1, Environmental consultant (specify type) Home Equipment: Walker - 2 wheels, Bedside commode, Hand held shower head  Prior Device Use: Indicate devices/aids used by  the patient prior to current illness, exacerbation or injury? None of the above  Prior Functional Level Prior Function Level of Independence: Independent Comments: independent with mobility and ADLs, no IADLs (some driving)   Self Care: Did the patient need help bathing, dressing, using the toilet or eating?  Independent  Indoor Mobility: Did the patient need assistance with walking from room to room (with or without device)? Independent  Stairs: Did the patient need assistance with internal or external stairs (with or without device)? Independent  Functional Cognition: Did the patient need help planning regular tasks such as shopping or remembering to take medications? Independent  Current Functional Level Cognition  Arousal/Alertness: Awake/alert Overall Cognitive Status: Impaired/Different from baseline Current Attention Level: Sustained Orientation Level: Oriented to person, Oriented to place, Disoriented to time Following Commands: Follows one step commands with increased time, Follows one step commands inconsistently Safety/Judgement: Decreased awareness of safety, Decreased awareness of deficits General Comments: multimodal cueing needed throughout. Pt demonstrated decreased attention to L side Attention: Focused Focused Attention: Impaired Focused Attention Impairment: Verbal basic, Functional basic Memory: Impaired Memory Impairment: Storage deficit Awareness: Impaired Awareness Impairment: Intellectual impairment Problem Solving: Impaired Problem Solving Impairment: Functional basic Executive Function: (all impaired due to lower level deficits) Safety/Judgment: Impaired Comments: left inattention     Extremity Assessment (includes Sensation/Coordination)  Upper Extremity Assessment: Defer to OT  evaluation RUE Deficits / Details: grossly 4/5 MMT, able to use functionally given increased time to process   LUE Deficits / Details: grossly 3-5 MMT, impaired  coordination and functional use, L inattention   Lower Extremity Assessment: RLE deficits/detail, LLE deficits/detail RLE Deficits / Details: generalized weakness, delayed processing/sequencing RLE Coordination: decreased gross motor LLE Deficits / Details: less initiation due to L sided neglect LLE Coordination: decreased gross motor    ADLs  Overall ADL's : Needs assistance/impaired Grooming: Wash/dry face, Minimal assistance, Bed level Upper Body Bathing: Maximal assistance, Bed level Lower Body Bathing: Total assistance, +2 for safety/equipment, +2 for physical assistance, Sit to/from stand Upper Body Dressing : Maximal assistance, Sitting Lower Body Dressing: Total assistance, +2 for physical assistance, Sit to/from stand Toilet Transfer Details (indicate cue type and reason): deferred Functional mobility during ADLs: Total assistance, +2 for physical assistance General ADL Comments: patient significantly limited by cognition, L inattention/neglect and decreased activity tolerance    Mobility  Overal bed mobility: Needs Assistance Bed Mobility: Supine to Sit Rolling: Total assist, +2 for physical assistance Sidelying to sit: Total assist, +2 for physical assistance Supine to sit: Mod assist Sit to supine: Max assist, +2 for physical assistance Sit to sidelying: Total assist, +2 for physical assistance General bed mobility comments: Pt requires VC throughout to complete supine to sit. Pt requires mod A to bring LLE EOB, and for initiation trunk stability. Pt aids using RUE on the bed rail. Pt moves at decreased speed, and increased time is needed.     Transfers  Overall transfer level: Needs assistance Equipment used: None, Rolling walker (2 wheeled) Transfers: Sit to/from Stand, [employer] Transfers Sit to Stand: Min assist, Mod assist Stand pivot transfers: Min assist, +2 safety/equipment General transfer comment: Pt able to complete sit to stand 2 times. Pt initially  requires mod A for power initiation, and stability without the use of the walker. Pt then completed a sit to stand with min A and the use of a RW, with verbal cueing given for BUE placement. Throughout, pt was given VC for upright posture, and maintaining vertical gaze stabilization. Min A x2 for safety was given during stand pivot without the use of a RW.      Ambulation / Gait / Stairs / Wheelchair Mobility  Ambulation/Gait Ambulation/Gait assistance: Min guard, +2 safety/equipment(chair follow) Gait Distance (Feet): 40 Feet(20'x2) Assistive device: Rolling walker (2 wheeled) Gait Pattern/deviations: Step-through pattern, Decreased stride length, Decreased dorsiflexion - left, Decreased weight shift to left General Gait Details: Pt ambulated 40` and required a rest break halfway through. Pt requires VC throughout to maintain body positioning inside the RW, and for erect standing posture. Pt required rest break after stating the need to sit once entering into the hallway. Pt was then able to ambulate back into the room. 2 person needed for chair follow secondary to decreased ambulation endurance and safety with AD.  Gait velocity: slow Gait velocity interpretation: <1.8 ft/sec, indicate of risk for recurrent falls    Posture / Balance Dynamic Sitting Balance Sitting balance - Comments: Pt able to complete period of sitting balance with no UE support, but unable to accept challenge. Pt demonstrates posterior lean once fatigued, but shows ability to correct with VC.  Balance Overall balance assessment: Needs assistance Sitting-balance support: Single extremity supported, Feet supported Sitting balance-Leahy Scale: Fair Sitting balance - Comments: Pt able to complete period of sitting balance with no UE support, but unable to accept challenge. Pt demonstrates posterior lean  once fatigued, but shows ability to correct with VC.  Postural control: Posterior lean Standing balance support: Bilateral  upper extremity supported, During functional activity Standing balance-Leahy Scale: Poor Standing balance comment: Pt relies on BUE support during ambulation. Pt able to tolerate standing balance with LUE support and min A.      Special needs/care consideration BiPAP/CPAP: has history of use of CPAP, however according to husband, she was taken off CPAP a few years ago.  CPM: no Continuous Drip IV: Magnesium sulfate IVPB  Dialysis: no        Days: no Life Vest: no Oxygen: on RA Special Bed: no Trach Size: no Wound Vac (area): no      Location: no Skin: incision to vagina                           Bowel mgmt: continent, last BM 03/04/18 Bladder mgmt: catheter in place; incontinence.  Diabetic mgmt: yes     Previous Home Environment Living Arrangements: Spouse/significant other Available Help at Discharge: Family, Available 24 hours/day Type of Home: House Home Layout: One level Home Access: Stairs to enter Entrance Stairs-Rails: Can reach both Entrance Stairs-Number of Steps: 6-9 Bathroom Shower/Tub: Chiropodist: [place]: No  Discharge Living Setting Plans for Discharge Living Setting: Patient's home Type of Home at Discharge: House Discharge Home Layout: One level Discharge Home Access: Stairs to enter Entrance Stairs-Rails: Can reach both Entrance Stairs-Number of Steps: 8-9 steps Discharge Bathroom Shower/Tub: Tub/shower unit Discharge Bathroom Toilet: Standard(but has BSC at home that can elevate commode as needed) Discharge Bathroom Accessibility: Yes How Accessible: Accessible via walker Does the patient have any problems obtaining your medications?: No  Social/Family/Support Systems Patient Roles: Spouse Contact Information: husband Azucena Fallen): [phone] Anticipated Caregiver: husband; per husband, his sister and borhter can sit in with her and look in on her PRN if needed Anticipated Caregiver's Contact  Information: husband Mallie Mussel): [phone] Ability/Limitations of Caregiver: Min A Caregiver Availability: Other (Comment)(husband not working til 1st of year, will re-eval assist) Discharge Plan Discussed with Primary Caregiver: Yes Is Caregiver In Agreement with Plan?: Yes Does Caregiver/Family have Issues with Lodging/Transportation while Pt is in Rehab?: No   Goals/Additional Needs Patient/Family Goal for Rehab: PT/OT: Supervision; SLP: Supervision/Mod I Expected length of stay: 15-18 days Cultural Considerations: NA Dietary Needs: DYS 3, thin liquids.  Equipment Needs: TBD Special Service Needs: may benefit from referral to neuropsych Pt/Family Agrees to Admission and willing to participate: Yes Program Orientation Provided & Reviewed with Pt/Caregiver Including Roles  & Responsibilities: Yes(with pt and husband)  Barriers to Discharge: Inaccessible home environment, Home environment access/layout, Incontinence, New oxygen  Barriers to Discharge Comments: new to O2, unsure if will need at home; steps to enter; husband may need to return to work    Decrease burden of Care through IP rehab admission: NA   Possible need for SNF placement upon discharge: Not anticipated; pt has supportive husband and has good prognosis for further progress through CIR.    Patient Condition: This patient's medical and functional status has changed since the consult dated: 03/04/18 in which the Rehabilitation Physician determined and documented that the patient's condition is appropriate for intensive rehabilitative care in an inpatient rehabilitation facility. See "History of Present Illness" (above) for medical update. Functional changes are: improvement in sit to stand from Mod A x2 to Min/Mod A, improvement in gait from Mod Ax2 150 feet to [employer]  x2 40 feet. Patient's medical and functional status update has been discussed with the Rehabilitation physician and patient remains appropriate for  inpatient rehabilitation. Will admit to inpatient rehab today.  Preadmission Screen Completed By:  Jhonnie Garner, 03/09/2018 4:13 PM ______________________________________________________________________   Discussed status with Dr. Posey Pronto on 4:13Pm at 03/09/18 and received telephone approval for admission today.  Admission Coordinator:  Jhonnie Garner, time 4:14PM/Date 03/09/18.           Cosigned by: Jamse Arn, MD at 03/09/2018 4:23 PM  Revision History

## 2018-03-09 NOTE — Care Management Note (Signed)
Case Management Note  Patient Details  Name: Theresa Ray MRN: [id number] Date of Birth: November 07, 1945  Subjective/Objective:                    Action/Plan: Pt discharging to CIR today. CM signing off.   Expected Discharge Date:  03/09/18               Expected Discharge Plan:  [place]  In-House Referral:     Discharge planning Services  CM Consult  Post Acute Care Choice:    Choice offered to:     DME Arranged:    DME Agency:     HH Arranged:    HH Agency:     Status of Service:  Completed, signed off  If discussed at [employer], dates discussed:    Additional Comments:  Pollie Friar, RN 03/09/2018, 4:14 PM

## 2018-03-09 NOTE — Progress Notes (Signed)
Physical Therapy Treatment Patient Details Name: Theresa Ray MRN: [id number] DOB: 1945/09/19 Today's Date: 03/09/2018    History of Present Illness Pt is a 72 year old lady with history of CAD and TIAs transferred from [facility] due to altered mental status and left-sided weakness was found to have ureteral obstruction status post stent. MRI revealed multifocal acute bilat small infaccts t/p brain compatible with embolic phenomena. 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   PT Comments    Pt demonstrates progression towards functional goals. Pt currently requires the overall level of mod A for bed mobility, min-mod A for transfers, and min guard x2 for safety/chair follow during ambulation. Pt requires multimodal cueing during functional mobility, with decreased processing speed noted. Pt continues to show inattention to the L side, but with improvements made from prior session, including increased visual tracking. Pt denies lightheadedness or dizziness throughout. CIR recommendation remains appropriate. Pt would benefit from continue acute PT in order to increase strength, functional mobility and balance.    Follow Up Recommendations  CIR     Equipment Recommendations  None recommended by PT    Recommendations for Other Services Rehab consult     Precautions / Restrictions Precautions Precautions: Fall Precaution Comments: L sided inattention Restrictions Weight Bearing Restrictions: No    Mobility  Bed Mobility Overal bed mobility: Needs Assistance Bed Mobility: Supine to Sit     Supine to sit: Mod assist     General bed mobility comments: Pt requires VC throughout to complete supine to sit. Pt requires mod A to bring LLE EOB, and for initiation trunk stability. Pt aids using RUE on the bed rail. Pt moves at decreased speed, and increased time is needed.   Transfers Overall transfer level: Needs assistance Equipment used: None;Rolling walker (2 wheeled) Transfers: Sit  to/from [facility] Sit to Stand: Min assist;Mod assist Stand pivot transfers: Min assist;+2 safety/equipment       General transfer comment: Pt able to complete sit to stand 2 times. Pt initially requires mod A for power initiation, and stability without the use of the walker. Pt then completed a sit to stand with min A and the use of a RW, with verbal cueing given for BUE placement. Throughout, pt was given VC for upright posture, and maintaining vertical gaze stabilization. Min A x2 for safety was given during stand pivot without the use of a RW.    Ambulation/Gait Ambulation/Gait assistance: Min guard;+2 safety/equipment(chair follow) Gait Distance (Feet): 40 Feet(20'x2) Assistive device: Rolling walker (2 wheeled) Gait Pattern/deviations: Step-through pattern;Decreased stride length;Decreased dorsiflexion - left;Decreased weight shift to left   Gait velocity interpretation: <1.8 ft/sec, indicate of risk for recurrent falls General Gait Details: Pt ambulated 40` and required a rest break halfway through. Pt requires VC throughout to maintain body positioning inside the RW, and for erect standing posture. Pt required rest break after stating the need to sit once entering into the hallway. Pt was then able to ambulate back into the room. 2 person needed for chair follow secondary to decreased ambulation endurance and safety with AD.    Stairs             Wheelchair Mobility    Modified Rankin (Stroke Patients Only) Modified Rankin (Stroke Patients Only) Pre-Morbid Rankin Score: No symptoms Modified Rankin: Moderately severe disability     Balance Overall balance assessment: Needs assistance Sitting-balance support: Single extremity supported;Feet supported Sitting balance-Leahy Scale: Fair Sitting balance - Comments: Pt able to complete period of sitting balance  with no UE support, but unable to accept challenge. Pt demonstrates posterior lean once fatigued,  but shows ability to correct with VC.  Postural control: Posterior lean Standing balance support: Bilateral upper extremity supported;During functional activity Standing balance-Leahy Scale: Poor Standing balance comment: Pt relies on BUE support during ambulation. Pt able to tolerate standing balance with LUE support and min A.                              Cognition Arousal/Alertness: Awake/alert Behavior During Therapy: Flat affect Overall Cognitive Status: Impaired/Different from baseline Area of Impairment: Safety/judgement;Awareness;Problem solving;Attention;Memory;Following commands                   Current Attention Level: Sustained Memory: Decreased recall of precautions;Decreased short-term memory Following Commands: Follows one step commands with increased time;Follows one step commands inconsistently Safety/Judgement: Decreased awareness of safety;Decreased awareness of deficits Awareness: Emergent Problem Solving: Decreased initiation;Slow processing;Difficulty sequencing;Requires verbal cues;Requires tactile cues General Comments: multimodal cueing needed throughout. Pt demonstrated decreased attention to L side      Exercises      General Comments        Pertinent Vitals/Pain Pain Assessment: No/denies pain    Home Living                      Prior Function            PT Goals (current goals can now be found in the care plan section) Acute Rehab PT Goals Patient Stated Goal: to go home PT Goal Formulation: With patient Time For Goal Achievement: 03/17/18 Potential to Achieve Goals: Good Progress towards PT goals: Progressing toward goals    Frequency    Min 4X/week      PT Plan Current plan remains appropriate    Co-evaluation              AM-PAC PT "6 Clicks" Mobility   Outcome Measure  Help needed turning from your back to your side while in a flat bed without using bedrails?: A Little Help needed moving  from lying on your back to sitting on the side of a flat bed without using bedrails?: A Little Help needed moving to and from a bed to a chair (including a wheelchair)?: A Lot Help needed standing up from a chair using your arms (e.g., wheelchair or bedside chair)?: A Little Help needed to walk in hospital room?: A Little Help needed climbing 3-5 steps with a railing? : A Lot 6 Click Score: 16    End of Session Equipment Utilized During Treatment: Gait belt Activity Tolerance: Patient tolerated treatment well Patient left: in chair;with call bell/phone within reach;with chair alarm set Nurse Communication: Mobility status PT Visit Diagnosis: Unsteadiness on feet (R26.81);Muscle weakness (generalized) (M62.81);Difficulty in walking, not elsewhere classified (R26.2)     Time: 3546-5681 PT Time Calculation (min) (ACUTE ONLY): 36 min  Charges:  $Gait Training: 23-37 mins                     Wandra Feinstein, SPT Acute Rehab [phone] (pager) [phone] (office)    Elliana Bal 03/09/2018, 1:58 PM

## 2018-03-10 ENCOUNTER — Inpatient Hospital Stay (HOSPITAL_COMMUNITY): Payer: Medicare HMO | Admitting: Speech Pathology

## 2018-03-10 ENCOUNTER — Inpatient Hospital Stay (HOSPITAL_COMMUNITY): Payer: Medicare HMO | Admitting: Occupational Therapy

## 2018-03-10 ENCOUNTER — Inpatient Hospital Stay (HOSPITAL_COMMUNITY): Payer: Medicare HMO

## 2018-03-10 DIAGNOSIS — E1169 Type 2 diabetes mellitus with other specified complication: Secondary | ICD-10-CM

## 2018-03-10 DIAGNOSIS — E669 Obesity, unspecified: Secondary | ICD-10-CM

## 2018-03-10 DIAGNOSIS — G460 Middle cerebral artery syndrome: Secondary | ICD-10-CM

## 2018-03-10 DIAGNOSIS — I10 Essential (primary) hypertension: Secondary | ICD-10-CM

## 2018-03-10 LAB — CBC WITH DIFFERENTIAL/PLATELET
Abs Immature Granulocytes: 0.08 10*3/uL — ABNORMAL HIGH (ref 0.00–0.07)
Basophils Absolute: 0.1 10*3/uL (ref 0.0–0.1)
Basophils Relative: 1 %
Eosinophils Absolute: 0.2 10*3/uL (ref 0.0–0.5)
Eosinophils Relative: 2 %
HCT: 34.5 % — ABNORMAL LOW (ref 36.0–46.0)
Hemoglobin: 11.3 g/dL — ABNORMAL LOW (ref 12.0–15.0)
Immature Granulocytes: 1 %
Lymphocytes Relative: 19 %
Lymphs Abs: 2.3 10*3/uL (ref 0.7–4.0)
MCH: 30 pg (ref 26.0–34.0)
MCHC: 32.8 g/dL (ref 30.0–36.0)
MCV: 91.5 fL (ref 80.0–100.0)
MONO ABS: 0.8 10*3/uL (ref 0.1–1.0)
Monocytes Relative: 7 %
Neutro Abs: 8.5 10*3/uL — ABNORMAL HIGH (ref 1.7–7.7)
Neutrophils Relative %: 70 %
Platelets: 574 10*3/uL — ABNORMAL HIGH (ref 150–400)
RBC: 3.77 MIL/uL — AB (ref 3.87–5.11)
RDW: 12.3 % (ref 11.5–15.5)
WBC: 12 10*3/uL — AB (ref 4.0–10.5)
nRBC: 0 % (ref 0.0–0.2)

## 2018-03-10 LAB — COMPREHENSIVE METABOLIC PANEL
ALT: 27 U/L (ref 0–44)
AST: 20 U/L (ref 15–41)
Albumin: 2.4 g/dL — ABNORMAL LOW (ref 3.5–5.0)
Alkaline Phosphatase: 47 U/L (ref 38–126)
Anion gap: 12 (ref 5–15)
BUN: 18 mg/dL (ref 8–23)
CO2: 24 mmol/L (ref 22–32)
Calcium: 8.7 mg/dL — ABNORMAL LOW (ref 8.9–10.3)
Chloride: 102 mmol/L (ref 98–111)
Creatinine, Ser: 1.3 mg/dL — ABNORMAL HIGH (ref 0.44–1.00)
GFR calc non Af Amer: 42 mL/min — ABNORMAL LOW (ref >60)
GFR, EST AFRICAN AMERICAN: 48 mL/min — AB (ref >60)
Glucose, Bld: 145 mg/dL — ABNORMAL HIGH (ref 70–99)
Potassium: 3.7 mmol/L (ref 3.5–5.1)
Sodium: 138 mmol/L (ref 135–145)
Total Bilirubin: 0.5 mg/dL (ref 0.3–1.2)
Total Protein: 6.7 g/dL (ref 6.5–8.1)

## 2018-03-10 LAB — GLUCOSE, CAPILLARY
Glucose-Capillary: 140 mg/dL — ABNORMAL HIGH (ref 70–99)
Glucose-Capillary: 297 mg/dL — ABNORMAL HIGH (ref 70–99)
Glucose-Capillary: 293 mg/dL — ABNORMAL HIGH (ref 70–99)
Glucose-Capillary: 172 mg/dL — ABNORMAL HIGH (ref 70–99)

## 2018-03-10 NOTE — Progress Notes (Signed)
PHYSICAL MEDICINE & REHABILITATION PROGRESS NOTE   Subjective/Complaints: Pt slept fairly well. Denies pain. Demonstrated concerns over the type of hear monitor she has/had----perseverated on them.   ROS: Limited due to cognitive/behavioral    Objective:   No results found. Recent Labs    03/10/18 0638  WBC 12.0*  HGB 11.3*  HCT 34.5*  PLT 574*   Recent Labs    03/09/18 0532 03/10/18 0638  NA 136 138  K 3.4* 3.7  CL 101 102  CO2 24 24  GLUCOSE 159* 145*  BUN 18 18  CREATININE 1.28* 1.30*  CALCIUM 8.5* 8.7*    Intake/Output Summary (Last 24 hours) at 03/10/2018 1108 Last data filed at 03/10/2018 1000 Gross per 24 hour  Intake 240 ml  Output 17 ml  Net 223 ml     Physical Exam: Vital Signs Blood pressure (!) 173/79, pulse 68, temperature 97.8 F (36.6 C), temperature source Oral, resp. rate 16, height 5\' 3"  (1.6 m), weight 86.4 kg, SpO2 98 %.  Constitutional: No distress . Vital signs reviewed. obese HEENT: EOMI, oral membranes moist Neck: supple Cardiovascular: RRR without murmur. No JVD    Respiratory: CTA Bilaterally without wheezes or rales. Normal effort, loop recorder in chest wall.     GI: BS +, non-tender, non-distended  Musculoskeletal:  No edema or tenderness in extremities  Neurological: She is alert.  Alert but easily distracted, can perseverate.   Follows simple commands Motor: LUE: 4-4+/5 proximal to distal--stable LLE: HF, KE 3+/5, ADF 4/5 RUE: 4/5 proximal to distal--no change  RLE: HF, KE 3/5, ADF 1-2/5  Skin: Skin is warm and dry.  Psychiatric: Her affect is blunt. Her speech is delayed. She is slowed.     Assessment/Plan: 1. Functional deficits secondary to right MCA and left MCA infarcts which require 3+ hours per day of interdisciplinary therapy in a comprehensive inpatient rehab setting.  Physiatrist is providing close team supervision and 24 hour management of active medical problems listed below.  Physiatrist  and rehab team continue to assess barriers to discharge/monitor patient progress toward functional and medical goals  Care Tool:  Bathing              Bathing assist       Upper Body Dressing/Undressing Upper body dressing        Upper body assist      Lower Body Dressing/Undressing Lower body dressing            Lower body assist       Toileting Toileting    Toileting assist Assist for toileting: Total Assistance - Patient < 25%     Transfers Chair/bed transfer  Transfers assist     Chair/bed transfer assist level: Moderate Assistance - Patient 50 - 74%     Locomotion Ambulation   Ambulation assist      Assist level: Maximal Assistance - Patient 25 - 49% Assistive device: Hand held assist Max distance: 10'   Walk 10 feet activity   Assist     Assist level: Maximal Assistance - Patient 25 - 49%     Walk 50 feet activity   Assist Walk 50 feet with 2 turns activity did not occur: Safety/medical concerns         Walk 150 feet activity   Assist Walk 150 feet activity did not occur: Safety/medical concerns         Walk 10 feet on uneven surface  activity   Assist Walk 10 feet on  uneven surfaces activity did not occur: Safety/medical concerns         Wheelchair     Assist Will patient use wheelchair at discharge?: No      Wheelchair assist level: Dependent - Patient 0%(likely will be primary ambulator)      Wheelchair 50 feet with 2 turns activity    Assist        Assist Level: Dependent - Patient 0%   Wheelchair 150 feet activity     Assist     Assist Level: Dependent - Patient 0%    Medical Problem List and Plan: 1.  Left side weakness secondary to acute right MCA, MCA/PCA and left punctate MCA infarctions  -beginning therapies today  --Interdisciplinary Team Conference today    -loop recorder in place 2.  DVT Prophylaxis/Anticoagulation: SCDs. Monitor for any signs of DVT 3. Pain  Management:  Tylenol as needed 4. Mood:  Celexa 10 mg daily. Provide emotional support 5. Neuropsych: This patient is capable of making decisions on her own behalf. 6. Skin/Wound Care:  Routine skin checks 7. Fluids/Electrolytes/Nutrition:  encourage PO  -I personally reviewed the patient's labs today.   8.  Left ureteral stone with hydronephrosis. Status post stenting.  Follow-up urology services [facility]  -monitor urine output/Cr 9. CAD/CABG. Continue aspirin and Plavix 10. Hypertension. Lisinopril 5 mg daily, Coreg 12.5 mg twice a day, Norvasc 10 mg daily. Monitor with increased mobility 11. Diabetes mellitus.Hemoglobin A1c 6.0.SSI.  patient on Glucotrol 10 mg twice a day and Glucophage 1000 mg twice a day prior to admission.  Resume if needed  -check cbgs ac and hs 12. AKI. Follow-up chemistries reviwewed and demonstrate improvement 13. Fever with leukocytosis. Blood cultures no growth to date. Complete course of OMNICEF. 14. Hyperlipidemia. Lipitor 15.  Hypothyroidism. Synthroid    LOS: 1 days A FACE TO FACE EVALUATION WAS PERFORMED  Meredith Staggers 03/10/2018, 11:08 AM

## 2018-03-10 NOTE — Evaluation (Signed)
Occupational Therapy Assessment and Plan  Patient Details  Name: Theresa Ray MRN: [id number] Date of Birth: Aug 19, 1945  OT Diagnosis: abnormal posture, cognitive deficits, disturbance of vision and muscle weakness (generalized) Rehab Potential: Rehab Potential (ACUTE ONLY): Good ELOS: 2 weeks   Today's Date: 03/10/2018 OT Individual Time: 1300-1415 OT Individual Time Calculation (min): 75 min     Problem List:  Patient Active Problem List   Diagnosis Date Noted  . 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 Hypothyroidism   . Dyslipidemia   . Leukocytosis   . AKI (acute kidney injury) ([place])   . Benign essential HTN   . Hydronephrosis with urinary obstruction due to renal calculus   . Chronic diastolic congestive heart failure ([place])   . Diabetes mellitus type 2 in obese ([place])   . Orthostasis   . Tachypnea   . Hypokalemia   . Severe sepsis ([place]) 03/02/2018  . Acute CVA (cerebrovascular accident) ([place])   . Pyelonephritis   . CAD (coronary artery disease) 05/12/2017  . Postoperative atrial fibrillation ([place]) 05/12/2017  . Orthostatic hypotension 03/28/2017  . Syncope 03/28/2017  . Tobacco abuse 03/18/2017  . Dizziness 03/18/2017  . Pressure injury of skin 02/26/2017  . NSTEMI (non-ST elevated myocardial infarction) ([place]) 02/16/2017  . Type 2 diabetes mellitus with diabetic neuropathy, without long-term current use of insulin ([place])   . Essential hypertension   . Hyperlipidemia     Past Medical History:  Past Medical History:  Diagnosis Date  . CAD in native artery    a. s/p CABGx3 in 01/2017.  Marland Kitchen Chronic diastolic CHF (congestive heart failure) ([place])   . Diabetes mellitus ([place])   . Diabetic neuropathy ([place])   . History of kidney stones    history of stones  . Hypertension   . Hypothyroidism   . NSTEMI (non-ST elevated myocardial infarction) ([place])   . Orthostatic hypotension   . Postoperative atrial fibrillation ([place])    a. after CABG 01/2017.  Marland Kitchen TIA  (transient ischemic attack)    Past Surgical History:  Past Surgical History:  Procedure Laterality Date  . ABDOMINAL HYSTERECTOMY    . CORONARY ARTERY BYPASS GRAFT N/A 02/24/2017   Procedure: CORONARY ARTERY BYPASS GRAFTING (CABG) USING LEFT INTERNAL MAMMARY ARTERY TO LAD AND ENDOSCOPICALLY HARVESTED GREATER SAPHENOUS VEIN TO PDA AND TO OM1.;  Surgeon: Grace Isaac, MD;  Location: [facility];  Service: Open Heart Surgery;  Laterality: N/A;  . ENDOVEIN HARVEST OF GREATER SAPHENOUS VEIN Right 02/24/2017   Procedure: ENDOVEIN HARVEST OF GREATER SAPHENOUS VEIN;  Surgeon: Grace Isaac, MD;  Location: [facility];  Service: Open Heart Surgery;  Laterality: Right;  . LAPAROSCOPIC GASTRIC BANDING    . LEFT HEART CATH AND CORONARY ANGIOGRAPHY N/A 02/18/2017   Procedure: LEFT HEART CATH AND CORONARY ANGIOGRAPHY;  Surgeon: Leonie Man, MD;  Location: [facility];  Service: Cardiovascular;  Laterality: N/A;  . LOOP RECORDER INSERTION N/A 03/06/2018   Procedure: LOOP RECORDER INSERTION;  Surgeon: Constance Haw, MD;  Location: [facility];  Service: Cardiovascular;  Laterality: N/A;  . TEE WITHOUT CARDIOVERSION N/A 02/24/2017   Procedure: TRANSESOPHAGEAL ECHOCARDIOGRAM (TEE);  Surgeon: Grace Isaac, MD;  Location: [facility];  Service: Open Heart Surgery;  Laterality: N/A;  . TEE WITHOUT CARDIOVERSION N/A 03/06/2018   Procedure: TRANSESOPHAGEAL ECHOCARDIOGRAM (TEE);  Surgeon: Dorothy Spark, MD;  Location: [facility] ENDOSCOPY;  Service: Cardiovascular;  Laterality: N/A;  loop    Assessment & Plan Clinical Impression: Patient  is a 72 y.o. year old female with history of CAD/CABG 2018 maintained on aspirin and Plavix, chronic diastolic congestive heart failure, diabetes mellitus, orthostatic hypotension. Per chart review, patient and husband, patient lives with spouse. Independent prior to admission. One level home with 6-9 to entry. Husband works during the day. Presented 03/02/2018  with altered mental status and left side weakness to [place] showed no large vessel occlusion. Patient did not receive TPA. Noted leukocytosis as well as creatinine 1.87 with CT of abdomen being completed showing left urteric obstruction secondary to kidney stone was taken to the OR for a double-J stent. She was then transferred to [facility] for further evaluation of left-sided weakness. MRI reviewed, showing bilateral infarcts. Per report, CT/MRI follow-up while at [facility] showed multifocal acute bilateral small infarct spanning multiple vascular territories. 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 Echocardiogram with ejection fraction of 55%. Systolic function was normal. TEE with no intracardiac source of emboli, negative bubble study without PFO and loop recorder placement 03/06/2018 . Follow-up urology services after recent placement of stent for kidney stone Dr.Dahlstedt and continue plan of care with follow-up urology services at [facility] did spike a low-grade fever blood cultures no growth to date, lactic acid 4.2 down to 2.0 and placed on Rocephin empirically changed Omnicef x four days..Leukocytosis improving from 22,600-12,000. Presently remains on aspirin and Plavix for CVA prophylaxis.  Patient transferred to CIR on 03/09/2018 .    Patient currently requires max with basic self-care skills secondary to muscle weakness, impaired timing and sequencing and decreased coordination, decreased attention to left and decreased motor planning, decreased initiation, decreased attention, decreased problem solving, decreased safety awareness and delayed processing and decreased sitting balance and decreased standing balance.  Prior to hospitalization, patient could complete ADL and mobility with modified independent .  Patient will benefit from skilled intervention to decrease level of assist  with basic self-care skills, increase independence with basic self-care skills and increase level of independence with iADL prior to discharge home with care partner.  Anticipate patient will require 24 hour supervision and minimal physical assistance and follow up outpatient.  OT - End of Session Activity Tolerance: Tolerates 10 - 20 min activity with multiple rests Endurance Deficit: Yes Endurance Deficit Description: fatigue noted with light activity OT Assessment Rehab Potential (ACUTE ONLY): Good OT Patient demonstrates impairments in the following area(s): Balance;Cognition;Endurance;Motor;Perception;Safety;Vision OT Basic ADL's Functional Problem(s): Grooming;Bathing;Dressing;Toileting OT Transfers Functional Problem(s): Toilet;Tub/Shower OT Plan OT Intensity: Minimum of 1-2 x/day, 45 to 90 minutes OT Frequency: 5 out of 7 days OT Duration/Estimated Length of Stay: 2 weeks OT Treatment/Interventions: Balance/vestibular training;Community reintegration;Patient/family education;Self Care/advanced ADL retraining;Therapeutic Exercise;UE/LE Coordination activities;Cognitive remediation/compensation;Discharge planning;DME/adaptive equipment instruction;Functional mobility training;Therapeutic Activities;UE/LE Strength taining/ROM;Visual/perceptual remediation/compensation OT Basic Self-Care Anticipated Outcome(s): Supervision OT Toileting Anticipated Outcome(s): Supervision OT Bathroom Transfers Anticipated Outcome(s): Supervision OT Recommendation Patient destination: Home Follow Up Recommendations: Outpatient OT Equipment Recommended: Tub/shower bench   Skilled Therapeutic Intervention Patient in bed upon arrival and states that she is anxious to take a shower and wash her hair.  Nursing addressed her pain complaints with medication.  Husband present at start and close of session today. Patient able to complete functional transfers and ambulation with RW min A level with one LOB  requiring mod A to regain.  She presents with delayed responses, impaired initiation and problem solving.  She is pleasant and cooperative.  See below for details of ADL/shower and  assessment items. Reviewed role of OT, goals for therapy and plan with patient and husband.  Both demonstrate understanding and agree with therapy plan.  Patient remained in w/c as posture is significantly improved in this position vs bed.  Seat belt alarm set and items within reach.     OT Evaluation Precautions/Restrictions  Precautions Precautions: Fall Precaution Comments: h/o multiple falls, CHF and orthostatic hypotension General Chart Reviewed: Yes Vital Signs Therapy Vitals Temp: 98.4 F (36.9 C) Temp Source: Oral Pulse Rate: 62 Resp: 18 BP: 122/63 Patient Position (if appropriate): Sitting Oxygen Therapy SpO2: 98 % O2 Device: Room Air Pain Pain Assessment Pain Scale: 0-10 Pain Score: 3  Pain Type: Acute pain Pain Location: Neck Pain Orientation: Posterior Pain Descriptors / Indicators: Aching Pain Frequency: Occasional Pain Onset: Gradual Patients Stated Pain Goal: 0 Pain Intervention(s): Repositioned;RN made aware;Distraction;Environmental changes;[place] expects to be discharged to:: Private residence Living Arrangements: Spouse/significant other Available Help at Discharge: Family, Available 24 hours/day Type of Home: House Home Access: Stairs to enter [employer] of Steps: 6-9 Entrance Stairs-Rails: Can reach both Home Layout: One level Bathroom Shower/Tub: Chiropodist: Standard  Lives With: Spouse Prior Function Level of Independence: Independent with transfers, Independent with gait, Independent with basic ADLs  Able to Take Stairs?: Yes Comments: used RW occasionally on bad days per her report ADL ADL Eating: Supervision/safety Where Assessed-Eating: Chair Grooming: Moderate  assistance Where Assessed-Grooming: Chair Upper Body Bathing: Minimal assistance Where Assessed-Upper Body Bathing: Shower Lower Body Bathing: Dependent Where Assessed-Lower Body Bathing: Shower Upper Body Dressing: Maximal assistance Where Assessed-Upper Body Dressing: Wheelchair Lower Body Dressing: Dependent Where Assessed-Lower Body Dressing: Wheelchair Toileting: Maximal assistance Where Assessed-Toileting: Glass blower/designer: Psychiatric nurse Method: Counselling psychologist: Energy manager: Maximal cueing, Minimal assistance Social research officer, government Method: Heritage manager: Gaffer Baseline Vision/History: Wears glasses Wears Glasses: Reading only Patient Visual Report: Other (comment)(she notes that her acuity was worsening prior to the CVA but she has not been to the eye doctor) Vision Assessment?: Yes Eye Alignment: Within Functional Limits Ocular Range of Motion: Within Functional Limits Alignment/Gaze Preference: Gaze right;Head turned;Chin down Tracking/Visual Pursuits: Requires cues, head turns, or add eye shifts to track Saccades: Decreased speed of saccadic movement Convergence: Within functional limits Visual Fields: Other (comment)(inattention to left side, overall distracted by environment - needs ongoing assessment and monitoring) Perception  Perception: Impaired Spatial Orientation: cues to orient clothing Praxis   Cognition Overall Cognitive Status: Impaired/Different from baseline Arousal/Alertness: Awake/alert Orientation Level: Person;Place;Situation Person: Oriented Place: Oriented Situation: Oriented Year: 2019 Month: November Day of Week: Correct Memory: Impaired Memory Impairment: Decreased recall of new information;Decreased short term memory Decreased Short Term Memory: Functional basic Immediate Memory Recall: Blue;Bed Memory Recall:  Blue;Bed Memory Recall Blue: Without Cue Memory Recall Bed: Without Cue Attention: Sustained Focused Attention: Impaired Focused Attention Impairment: Verbal basic;Functional basic Sustained Attention: Impaired Sustained Attention Impairment: Verbal basic;Functional basic Awareness: Impaired Awareness Impairment: Intellectual impairment Problem Solving: Impaired Problem Solving Impairment: Functional basic Safety/Judgment: Impaired Comments: left inattention, poor initation and thoroughness Sensation Sensation Light Touch: Appears Intact Hot/Cold: Appears Intact Proprioception: Appears Intact Coordination Gross Motor Movements are Fluid and Coordinated: No Fine Motor Movements are Fluid and Coordinated: No Coordination and Movement Description: delayed Finger Nose Finger Test: accurate but slow rate Motor  Motor Motor - Skilled Clinical Observations: generalized weakness; slow response/reactions Mobility  Transfers Sit to Stand: Minimal Assistance -  Patient > 75%  Trunk/Postural Assessment     Balance Balance Balance Assessed: Yes Static Sitting Balance Static Sitting - Level of Assistance: 4: Min assist Dynamic Sitting Balance Dynamic Sitting - Level of Assistance: 3: Mod assist Sitting balance - Comments: lean to left, worsens with fatigue Static Standing Balance Static Standing - Level of Assistance: 3: Mod assist Dynamic Standing Balance Dynamic Standing - Level of Assistance: 3: Mod assist Extremity/Trunk Assessment RUE Assessment RUE Assessment: Within Functional Limits Active Range of Motion (AROM) Comments: WFL General Strength Comments: 4-/5 t/o LUE Assessment LUE Assessment: Within Functional Limits Active Range of Motion (AROM) Comments: WFLs General Strength Comments: 4-/5 t/o     Refer to Care Plan for Long Term Goals  Recommendations for other services: None    Discharge Criteria: Patient will be discharged from OT if patient refuses  treatment 3 consecutive times without medical reason, if treatment goals not met, if there is a change in medical status, if patient makes no progress towards goals or if patient is discharged from hospital.  The above assessment, treatment plan, treatment alternatives and goals were discussed and mutually agreed upon: by patient and by family  Carlos Levering 03/10/2018, 3:48 PM

## 2018-03-10 NOTE — Progress Notes (Addendum)
Inpatient Rehabilitation  Patient information reviewed and entered into eRehab system by Osmar Howton M. Marionna Gonia, M.A., CCC/SLP, PPS Coordinator.  Information including medical coding, functional ability and quality indicators will be reviewed and updated through discharge.    Per nursing patient was given "Data Collection Information Summary for Patients in Inpatient Rehabilitation Facilities with attached "Privacy Act Statement-Health Care Records" upon admission, present in education notebook.  

## 2018-03-10 NOTE — Evaluation (Signed)
Physical Therapy Assessment and Plan  Patient Details  Name: JAIRY ANGULO MRN: [id number] Date of Birth: 27-Sep-1945  PT Diagnosis: Cognitive deficits, Difficulty walking, Edema, Muscle weakness  Rehab Potential: Good ELOS: 12-14 days   Today's Date: 03/10/2018 PT Individual Time: 7858-8502 PT Individual Time Calculation (min): 65 min    Problem List:  Patient Active Problem List   Diagnosis Date Noted  . 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 Hypothyroidism   . Dyslipidemia   . Leukocytosis   . AKI (acute kidney injury) ([place])   . Benign essential HTN   . Hydronephrosis with urinary obstruction due to renal calculus   . Chronic diastolic congestive heart failure ([place])   . Diabetes mellitus type 2 in obese ([place])   . Orthostasis   . Tachypnea   . Hypokalemia   . Severe sepsis ([place]) 03/02/2018  . Acute CVA (cerebrovascular accident) ([place])   . Pyelonephritis   . CAD (coronary artery disease) 05/12/2017  . Postoperative atrial fibrillation ([place]) 05/12/2017  . Orthostatic hypotension 03/28/2017  . Syncope 03/28/2017  . Tobacco abuse 03/18/2017  . Dizziness 03/18/2017  . Pressure injury of skin 02/26/2017  . NSTEMI (non-ST elevated myocardial infarction) ([place]) 02/16/2017  . Type 2 diabetes mellitus with diabetic neuropathy, without long-term current use of insulin ([place])   . Essential hypertension   . Hyperlipidemia     Past Medical History:  Past Medical History:  Diagnosis Date  . CAD in native artery    a. s/p CABGx3 in 01/2017.  Marland Kitchen Chronic diastolic CHF (congestive heart failure) ([place])   . Diabetes mellitus ([place])   . Diabetic neuropathy ([place])   . History of kidney stones    history of stones  . Hypertension   . Hypothyroidism   . NSTEMI (non-ST elevated myocardial infarction) ([place])   . Orthostatic hypotension   . Postoperative atrial fibrillation ([place])    a. after CABG 01/2017.  Marland Kitchen TIA (transient ischemic attack)    Past Surgical History:  Past  Surgical History:  Procedure Laterality Date  . ABDOMINAL HYSTERECTOMY    . CORONARY ARTERY BYPASS GRAFT N/A 02/24/2017   Procedure: CORONARY ARTERY BYPASS GRAFTING (CABG) USING LEFT INTERNAL MAMMARY ARTERY TO LAD AND ENDOSCOPICALLY HARVESTED GREATER SAPHENOUS VEIN TO PDA AND TO OM1.;  Surgeon: Grace Isaac, MD;  Location: [place];  Service: Open Heart Surgery;  Laterality: N/A;  . ENDOVEIN HARVEST OF GREATER SAPHENOUS VEIN Right 02/24/2017   Procedure: ENDOVEIN HARVEST OF GREATER SAPHENOUS VEIN;  Surgeon: Grace Isaac, MD;  Location: [place];  Service: Open Heart Surgery;  Laterality: Right;  . LAPAROSCOPIC GASTRIC BANDING    . LEFT HEART CATH AND CORONARY ANGIOGRAPHY N/A 02/18/2017   Procedure: LEFT HEART CATH AND CORONARY ANGIOGRAPHY;  Surgeon: Leonie Man, MD;  Location: [place] [facility];  Service: Cardiovascular;  Laterality: N/A;  . LOOP RECORDER INSERTION N/A 03/06/2018   Procedure: LOOP RECORDER INSERTION;  Surgeon: Constance Haw, MD;  Location: [place] [facility];  Service: Cardiovascular;  Laterality: N/A;  . TEE WITHOUT CARDIOVERSION N/A 02/24/2017   Procedure: TRANSESOPHAGEAL ECHOCARDIOGRAM (TEE);  Surgeon: Grace Isaac, MD;  Location: [place];  Service: Open Heart Surgery;  Laterality: N/A;  . TEE WITHOUT CARDIOVERSION N/A 03/06/2018   Procedure: TRANSESOPHAGEAL ECHOCARDIOGRAM (TEE);  Surgeon: Dorothy Spark, MD;  Location: [facility] ENDOSCOPY;  Service: Cardiovascular;  Laterality: N/A;  loop    Assessment & Plan Clinical Impression: Patient is a 72 year old right-handed female with history of  CAD/CABG 2018 maintained on aspirin and Plavix, chronic diastolic congestive heart failure, diabetes mellitus, orthostatic hypotension. Per chart review, patient and husband, patient lives with spouse. Independent prior to admission. One level home with 6-9 to entry. Husband works during the day. Presented 03/02/2018 with altered mental status and left side weakness to  [place] showed no large vessel occlusion. Patient did not receive TPA. Noted leukocytosis as well as creatinine 1.87 with CT of abdomen being completed showing left urteric obstruction secondary to kidney stone was taken to the OR for a double-J stent. She was then transferred to [facility] for further evaluation of left-sided weakness. MRI reviewed, showing bilateral infarcts. Per report, CT/MRI follow-up while at [facility] showed multifocal acute bilateral small infarct spanning multiple vascular territories. 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 Echocardiogram with ejection fraction of 55%. Systolic function was normal. TEE with no intracardiac source of emboli, negative bubble study without PFO and loop recorder placement 03/06/2018 . Follow-up urology services after recent placement of stent for kidney stone Dr.Dahlstedt and continue plan of care with follow-up urology services at [facility] did spike a low-grade fever blood cultures no growth to date, lactic acid 4.2 down to 2.0 and placed on Rocephin empirically changed Omnicef x four days..Leukocytosis improving from 22,600-12,000. Presently remains on aspirin and Plavix for CVA prophylaxis..  Patient transferred to CIR on 03/09/2018 .   Patient currently requires mod with mobility secondary to muscle weakness, decreased cardiorespiratoy endurance, decreased attention to left, decreased initiation, decreased attention, decreased awareness, decreased problem solving, decreased safety awareness, decreased memory and delayed processing and decreased sitting balance, decreased standing balance, decreased postural control and decreased balance strategies.  Prior to hospitalization, patient was independent  with mobility and lived with Spouse in a House home.  Home access is 6-9Stairs to enter.  Patient will benefit from skilled PT intervention  to maximize safe functional mobility, minimize fall risk and decrease caregiver burden for planned discharge home with 24 hour supervision.  Anticipate patient will HHPT v OPPT at discharge.  PT - End of Session Activity Tolerance: Decreased this session;Tolerates 30+ min activity with multiple rests Endurance Deficit: Yes PT Assessment Rehab Potential (ACUTE/IP ONLY): Good PT Patient demonstrates impairments in the following area(s): Balance;Edema;Endurance;Motor;Pain;Perception;Safety;Sensory;Skin Integrity PT Transfers Functional Problem(s): Bed Mobility;Bed to Chair;Car;Furniture;Floor PT Locomotion Functional Problem(s): Ambulation;Stairs PT Plan PT Intensity: Minimum of 1-2 x/day ,45 to 90 minutes PT Frequency: 5 out of 7 days PT Duration Estimated Length of Stay: 12-14 days PT Treatment/Interventions: Ambulation/gait training;Balance/vestibular training;Cognitive remediation/compensation;Community reintegration;Discharge planning;Disease management/prevention;DME/adaptive equipment instruction;Functional mobility training;Neuromuscular re-education;Pain management;Patient/family education;Psychosocial support;Skin care/wound management;Splinting/orthotics;Stair training;Therapeutic Activities;Therapeutic Exercise;UE/LE Strength taining/ROM;UE/LE Coordination activities;Visual/perceptual remediation/compensation;Wheelchair propulsion/positioning PT Transfers Anticipated Outcome(s): supervision basic transfers; min assist floor PT Locomotion Anticipated Outcome(s): supervision gait and stairs PT Recommendation Follow Up Recommendations: Home health PT;24 hour supervision/assistance;Outpatient PT Patient destination: Home Equipment Recommended: To be determined Equipment Details: pt reports having access to RW  Skilled Therapeutic Intervention Evaluation completed (see details above and below) with education on PT POC and goals and individual treatment initiated with focus on functional  mobility including transfers, gait, bed mobility, and balance. Pt requires extra time for processing for all tasks and demonstrates impaired sustained attention to task requiring multimodal cues. Pt difficulty with problem solving during functional mobility more evident with car transfer and bed mobility despite multimodal cues. Pt requires min to mod assist for sit <> stands throughout session with cues for  technique and hand placement. Gait initiated without AD requiring mod to max assist for weightshift and pt wanting to keep holding onto the wall or something nearby. Would recommend trial with RW in next session for UE support and anticipate pt will do well physically with this. Stair negotiation with min to mod assist using rails to simulate home entry with self selected step to and alternating pattern with assist to control descent. During car transfer, pt requiring significant amount of time for problem solving, sequencing, and initiation. Pt also with decreased awareness and ability to correct body position (leaning to the L) requiring overall mod to max assist to complete full transfer.  PT Evaluation Precautions/Restrictions Precautions Precautions: Fall;Other (comment) Precaution Comments: h/o multiple falls, CHF and orthostatic hypotension Restrictions Weight Bearing Restrictions: No Pain Denies pain currently. Home Living/Prior Functioning Home Living Available Help at Discharge: Family;Available 24 hours/day Type of Home: House Home Access: Stairs to enter [employer] of Steps: 6-9 Entrance Stairs-Rails: Can reach both Home Layout: One level  Lives With: Spouse Prior Function Level of Independence: Independent with transfers;Independent with gait;Independent with basic ADLs  Able to Take Stairs?: Yes Comments: used RW occasionally on bad days per her report Vision/Perception  Perception Perception: Impaired Inattention/Neglect: (mild L inattention)   Cognition Overall Cognitive Status: Impaired/Different from baseline Arousal/Alertness: Awake/alert Orientation Level: Oriented X4 Attention: Sustained Focused Attention: Appears intact Sustained Attention: Impaired Sustained Attention Impairment: Verbal basic;Functional basic Memory: Impaired Memory Impairment: Decreased recall of new information;Decreased short term memory Decreased Short Term Memory: Functional basic Awareness: Impaired Awareness Impairment: Intellectual impairment Problem Solving: Impaired Problem Solving Impairment: Functional basic Safety/Judgment: Impaired Sensation Sensation Light Touch: Appears Intact Proprioception: Appears Intact Coordination Gross Motor Movements are Fluid and Coordinated: No Coordination and Movement Description: delayed Motor  Motor Motor: Abnormal postural alignment and control;Other (comment) Motor - Skilled Clinical Observations: generalized weakness; slow response/reactions  Mobility Bed Mobility Bed Mobility: Rolling Right;Supine to Sit Rolling Right: Minimal Assistance - Patient > 75% Supine to Sit: Moderate Assistance - Patient 50-74% Transfers Transfers: Sit to Stand;Stand Pivot Transfers Sit to Stand: Moderate Assistance - Patient 50-74% Stand Pivot Transfers: Moderate Assistance - Patient 50 - 74% Stand Pivot Transfer Details: Tactile cues for posture;Tactile cues for weight shifting;Verbal cues for precautions/safety;Verbal cues for sequencing;Verbal cues for technique;Visual cues/gestures for precautions/safety;Visual cues/gestures for sequencing Locomotion  Gait Gait Distance (Feet): 10 Feet Assistive device: 1 person hand held assist Gait Gait Pattern: Impaired Stairs / Additional Locomotion Stairs: Yes Stairs Assistance: Moderate Assistance - Patient 50 - 74% Stair Management Technique: Two rails;Step to pattern;Alternating pattern Number of Stairs: 4 Height of Stairs: 6 Wheelchair Mobility Wheelchair  Mobility: No  Trunk/Postural Assessment  Cervical Assessment Cervical Assessment: (forward head) Thoracic Assessment Thoracic Assessment: (flexed posture) Lumbar Assessment Lumbar Assessment: (posterior pelvic tilt) Postural Control Postural Control: Deficits on evaluation Righting Reactions: delayed Protective Responses: delayed  Balance Balance Balance Assessed: Yes Static Sitting Balance Static Sitting - Level of Assistance: 5: Stand by assistance;4: Min assist Dynamic Sitting Balance Dynamic Sitting - Level of Assistance: 4: Min assist Static Standing Balance Static Standing - Level of Assistance: 4: Min assist;3: Mod assist Dynamic Standing Balance Dynamic Standing - Level of Assistance: 3: Mod assist Extremity Assessment      RLE Assessment RLE Assessment: Exceptions to [facility] General Strength Comments: grossly 4/5 LLE Assessment LLE Assessment: Exceptions to [facility] General Strength Comments: grossly 4-/5    Refer to Care Plan for Long Term Goals  Recommendations for other services: Therapeutic Recreation  Pet therapy  Discharge Criteria: Patient will be discharged from PT if patient refuses treatment 3 consecutive times without medical reason, if treatment goals not met, if there is a change in medical status, if patient makes no progress towards goals or if patient is discharged from hospital.  The above assessment, treatment plan, treatment alternatives and goals were discussed and mutually agreed upon: by patient  Juanna Cao, PT, DPT, CBIS  03/10/2018, 11:26 AM

## 2018-03-10 NOTE — Evaluation (Signed)
Speech Language Pathology Assessment and Plan  Patient Details  Name: Theresa Ray MRN: [id number] Date of Birth: [birth year]  SLP Diagnosis: Cognitive Impairments;Dysphagia  Rehab Potential: Good ELOS: 12-14 days     Today's Date: 03/10/2018 SLP Individual Time: 7062-3762 Time Calculation: 55 mins   Problem List:  Patient Active Problem List   Diagnosis Date Noted  . 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 Hypothyroidism   . Dyslipidemia   . Leukocytosis   . AKI (acute kidney injury) ([place])   . Benign essential HTN   . Hydronephrosis with urinary obstruction due to renal calculus   . Chronic diastolic congestive heart failure ([place])   . Diabetes mellitus type 2 in obese ([place])   . Orthostasis   . Tachypnea   . Hypokalemia   . Severe sepsis ([place]) 03/02/2018  . Acute CVA (cerebrovascular accident) ([place])   . Pyelonephritis   . CAD (coronary artery disease) 05/12/2017  . Postoperative atrial fibrillation ([place]) 05/12/2017  . Orthostatic hypotension 03/28/2017  . Syncope 03/28/2017  . Tobacco abuse 03/18/2017  . Dizziness 03/18/2017  . Pressure injury of skin 02/26/2017  . NSTEMI (non-ST elevated myocardial infarction) ([place]) 02/16/2017  . Type 2 diabetes mellitus with diabetic neuropathy, without long-term current use of insulin ([place])   . Essential hypertension   . Hyperlipidemia    Past Medical History:  Past Medical History:  Diagnosis Date  . CAD in native artery    a. s/p CABGx3 in 01/2017.  Marland Kitchen Chronic diastolic CHF (congestive heart failure) ([place])   . Diabetes mellitus ([place])   . Diabetic neuropathy ([place])   . History of kidney stones    history of stones  . Hypertension   . Hypothyroidism   . NSTEMI (non-ST elevated myocardial infarction) ([place])   . Orthostatic hypotension   . Postoperative atrial fibrillation ([place])    a. after CABG 01/2017.  Marland Kitchen TIA (transient ischemic attack)    Past Surgical History:  Past Surgical History:  Procedure Laterality Date   . ABDOMINAL HYSTERECTOMY    . CORONARY ARTERY BYPASS GRAFT N/A 02/24/2017   Procedure: CORONARY ARTERY BYPASS GRAFTING (CABG) USING LEFT INTERNAL MAMMARY ARTERY TO LAD AND ENDOSCOPICALLY HARVESTED GREATER SAPHENOUS VEIN TO PDA AND TO OM1.;  Surgeon: Grace Isaac, MD;  Location: [place];  Service: Open Heart Surgery;  Laterality: N/A;  . ENDOVEIN HARVEST OF GREATER SAPHENOUS VEIN Right 02/24/2017   Procedure: ENDOVEIN HARVEST OF GREATER SAPHENOUS VEIN;  Surgeon: Grace Isaac, MD;  Location: [place];  Service: Open Heart Surgery;  Laterality: Right;  . LAPAROSCOPIC GASTRIC BANDING    . LEFT HEART CATH AND CORONARY ANGIOGRAPHY N/A 02/18/2017   Procedure: LEFT HEART CATH AND CORONARY ANGIOGRAPHY;  Surgeon: Leonie Man, MD;  Location: [place] [facility];  Service: Cardiovascular;  Laterality: N/A;  . LOOP RECORDER INSERTION N/A 03/06/2018   Procedure: LOOP RECORDER INSERTION;  Surgeon: Constance Haw, MD;  Location: [place] [facility];  Service: Cardiovascular;  Laterality: N/A;  . TEE WITHOUT CARDIOVERSION N/A 02/24/2017   Procedure: TRANSESOPHAGEAL ECHOCARDIOGRAM (TEE);  Surgeon: Grace Isaac, MD;  Location: [place];  Service: Open Heart Surgery;  Laterality: N/A;  . TEE WITHOUT CARDIOVERSION N/A 03/06/2018   Procedure: TRANSESOPHAGEAL ECHOCARDIOGRAM (TEE);  Surgeon: Dorothy Spark, MD;  Location: [facility] ENDOSCOPY;  Service: Cardiovascular;  Laterality: N/A;  loop    Assessment / Plan / Recommendation Clinical Impression Patient is a 72 year old right-handed female with history of CAD/CABG 2018 maintained on aspirin  and Plavix, chronic diastolic congestive heart failure, diabetes mellitus, orthostatic hypotension. Per chart review, patient and husband, patient lives with spouse. Independent prior to admission. One level home with 6-9 to entry. Husband works during the day. Presented 03/02/2018 with altered mental status and left side weakness to [place] showed no  large vessel occlusion. Patient did not receive TPA. Noted leukocytosis as well as creatinine 1.87 with CT of abdomen being completed showing left urteric obstruction secondary to kidney stone was taken to the OR for a double-J stent. She was then transferred to [facility] for further evaluation of left-sided weakness. MRI reviewed, showing bilateral infarcts. Per report, CT/MRI follow-up while at [facility] showed multifocal acute bilateral small infarct spanning multiple vascular territories. 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 Echocardiogram with ejection fraction of 55%. Systolic function was normal. TEE with no intracardiac source of emboli, negative bubble study without PFO and loop recorder placement 03/06/2018 . Follow-up urology services after recent placement of stent for kidney stone Dr.Dahlstedt and continue plan of care with follow-up urology services at [facility] did spike a low-grade fever blood cultures no growth to date, lactic acid 4.2 down to 2.0 and placed on Rocephin empirically changed Omnicef x four days..Leukocytosis improving from 22,600-12,000. Presently remains on aspirin and Plavix for CVA prophylaxis.Therapy evaluations completed with recommendations of physical medicine rehabilitation consult. Patient was admitted for a comprehensive rehabilitation program 03/09/18.  Patient demonstrates moderate-severe cognitive impairments impacting sustained attention, initiation, processing speed, attention to left field of environment, recall, awareness and problem solving which impacts her safety with functional and familiar tasks.  Patient also consumed thin liquids via straw without overt s/s of aspiration and demonstrated efficient mastication and complete oral clearance with trials of regular textures. Therefore, recommend patient upgrade to regular textures. Patient would benefit from  skilled SLP intervention to maximize her cognitive and swallowing function prior to discharge home.     Skilled Therapeutic Interventions          Administered a cognitive-linguistic evaluation and BSE, please see above for details.   SLP Assessment  Patient will need skilled Speech Lanaguage Pathology Services during CIR admission    Recommendations  SLP Diet Recommendations: Age appropriate regular solids;Thin Liquid Administration via: Straw;Cup Medication Administration: Whole meds with liquid Supervision: Patient able to self feed;Intermittent supervision to cue for compensatory strategies Compensations: Minimize environmental distractions;Slow rate;Small sips/bites Postural Changes and/or Swallow Maneuvers: Seated upright 90 degrees Oral Care Recommendations: Oral care BID Recommendations for Other Services: Neuropsych consult Patient destination: Home Follow up Recommendations: Home Health SLP;24 hour supervision/assistance;Outpatient SLP Equipment Recommended: None recommended by SLP    SLP Frequency 3 to 5 out of 7 days   SLP Duration  SLP Intensity  SLP Treatment/Interventions 12-14 days   Minumum of 1-2 x/day, 30 to 90 minutes  Cognitive remediation/compensation;Dysphagia/aspiration precaution training;Cueing hierarchy;Environmental controls;Functional tasks;Patient/family education;Therapeutic Activities;Internal/external aids    Pain No/Denies Pain   Prior Functioning Type of Home: House  Lives With: Spouse Available Help at Discharge: Family;Available 24 hours/day  Short Term Goals: Week 1: SLP Short Term Goal 1 (Week 1): Patient will consume current diet with minimal overt s/s of aspiration with Mod I for use of swallowing strategies.  SLP Short Term Goal 2 (Week 1): Patient will demonstrate sustained attention to functional tasks for 10 minutes with Mod A verbal cues for redirection.  SLP Short Term Goal 3 (Week 1): Patient will initiate functional  tasks with  Min A verbal cues. SLP Short Term Goal 4 (Week 1): Patient will demonstrate functional problem solving for basic and familiar tasks with Mod A verbal cues.   Refer to Care Plan for Long Term Goals  Recommendations for other services: Neuropsych  Discharge Criteria: Patient will be discharged from SLP if patient refuses treatment 3 consecutive times without medical reason, if treatment goals not met, if there is a change in medical status, if patient makes no progress towards goals or if patient is discharged from hospital.  The above assessment, treatment plan, treatment alternatives and goals were discussed and mutually agreed upon: by patient  Nikos Anglemyer 03/10/2018, 12:02 PM

## 2018-03-11 ENCOUNTER — Inpatient Hospital Stay (HOSPITAL_COMMUNITY): Payer: Medicare HMO

## 2018-03-11 ENCOUNTER — Inpatient Hospital Stay (HOSPITAL_COMMUNITY): Payer: Medicare HMO | Admitting: Speech Pathology

## 2018-03-11 ENCOUNTER — Encounter (HOSPITAL_COMMUNITY): Payer: Medicare HMO | Admitting: Psychology

## 2018-03-11 ENCOUNTER — Inpatient Hospital Stay (HOSPITAL_COMMUNITY): Payer: Medicare HMO | Admitting: Occupational Therapy

## 2018-03-11 ENCOUNTER — Inpatient Hospital Stay (HOSPITAL_COMMUNITY): Payer: Medicare HMO | Admitting: Physical Therapy

## 2018-03-11 DIAGNOSIS — F329 Major depressive disorder, single episode, unspecified: Secondary | ICD-10-CM

## 2018-03-11 LAB — BASIC METABOLIC PANEL
Anion gap: 11 (ref 5–15)
BUN: 19 mg/dL (ref 8–23)
CO2: 22 mmol/L (ref 22–32)
Calcium: 8.6 mg/dL — ABNORMAL LOW (ref 8.9–10.3)
Chloride: 101 mmol/L (ref 98–111)
Creatinine, Ser: 1.31 mg/dL — ABNORMAL HIGH (ref 0.44–1.00)
GFR calc Af Amer: 48 mL/min — ABNORMAL LOW (ref >60)
GFR calc non Af Amer: 41 mL/min — ABNORMAL LOW (ref >60)
Glucose, Bld: 194 mg/dL — ABNORMAL HIGH (ref 70–99)
Potassium: 3.9 mmol/L (ref 3.5–5.1)
Sodium: 134 mmol/L — ABNORMAL LOW (ref 135–145)

## 2018-03-11 LAB — GLUCOSE, CAPILLARY
GLUCOSE-CAPILLARY: 156 mg/dL — AB (ref 70–99)
Glucose-Capillary: 182 mg/dL — ABNORMAL HIGH (ref 70–99)

## 2018-03-11 MED ORDER — GLIPIZIDE 5 MG PO TABS
5.0000 mg | ORAL_TABLET | Freq: Two times a day (BID) | ORAL | Status: DC
  Administered 2018-03-11 – 2018-03-24 (×26): 5 mg via ORAL
  Filled 2018-03-11 (×26): qty 1

## 2018-03-11 NOTE — Progress Notes (Signed)
Occupational Therapy Session Note  Patient Details  Name: Theresa Ray MRN: [id number] Date of Birth: October 31, 1945  Today's Date: 03/11/2018 OT Individual Time: 1445-1520 OT Individual Time Calculation (min): 35 min    Short Term Goals: Week 1:  OT Short Term Goal 1 (Week 1): Patient will complete bathing and dressing tasks with min A seated with support using appropriate ADs as needed OT Short Term Goal 2 (Week 1): Patient will complete funcitonal transfers with LRAD CG, no LOB and good awareness of left side OT Short Term Goal 3 (Week 1): Patient will identify safe strategies for adl tasks and recall 90% of newly learned techniques OT Short Term Goal 4 (Week 1): Patient will tolerate standing for 10 minutes of activity with fair + standing balance, and unsupported sitting with good balance  Skilled Therapeutic Interventions/Progress Updates:    Patient transitioned from prior therapy session to session with me today.  She states that she is feeling okay but getting tired.  Completed SPT with HHA to/from w/c, mat and recliner this session CG/min A.  Tolerated unsupported sitting for 15 minutes with CS, maintained midline, no LOB or lean today.  Fatigue and lower back discomfort limited sitting today.    Completed matching/visual scanning task - trial one with moderate cues to maintain attention to task, noted to perseverate.  Trial 2 with instructions to move quickly as she was being timed for the task - only able to complete 10/18 in 7 minutes, ongoing cues to attend. Patient returned to room, seated in recliner with seat belt alarm set and items within reach.  Therapy Documentation Precautions:  Precautions Precautions: Fall Precaution Comments: h/o multiple falls, CHF and orthostatic hypotension Restrictions Weight Bearing Restrictions: No General:   Vital Signs:   Pain: Pain Assessment Pain Scale: 0-10 Pain Score: 0-No pain Pain Type: Acute pain Pain Location: Neck Pain  Orientation: Posterior Pain Descriptors / Indicators: Aching;Sore Pain Onset: On-going Patients Stated Pain Goal: 0 Pain Intervention(s): Medication (See eMAR);RN made aware   Therapy/Group: Individual Therapy  Carlos Levering 03/11/2018, 3:22 PM

## 2018-03-11 NOTE — Progress Notes (Signed)
Occupational Therapy Session Note  Patient Details  Name: SHEMIA BEVEL MRN: [id number] Date of Birth: [birth year]  Today's Date: 03/11/2018 OT Individual Time: 1400-1445 OT Individual Time Calculation (min): 45 min    Short Term Goals: Week 1:  OT Short Term Goal 1 (Week 1): Patient will complete bathing and dressing tasks with min A seated with support using appropriate ADs as needed OT Short Term Goal 2 (Week 1): Patient will complete funcitonal transfers with LRAD CG, no LOB and good awareness of left side OT Short Term Goal 3 (Week 1): Patient will identify safe strategies for adl tasks and recall 90% of newly learned techniques OT Short Term Goal 4 (Week 1): Patient will tolerate standing for 10 minutes of activity with fair + standing balance, and unsupported sitting with good balance  Skilled Therapeutic Interventions/Progress Updates:    Session focused on toileting tasks and sit <> stand transfers/stnading tolerance. Pt received on toilet with NT present. Pt stood with min A and required min A balance support as she performed peri hygiene. Pt transferred to w/c with min A. Pt set up at sink to perform oral care and grooming tasks. Slow processing speed observed this session, with pt taking several seconds to follow directions. Pt was then brought down to therapy gym where she completed 5x sit to stand at elevated table, attempting to play Jenga. Pt required mod cueing to carryover game instructions, frequently making same mistakes with each turn. Pt unable to remain standing for longer than ~15 seconds. Pt was passed off to other OT in hallway. No pain reported throughout session.   Therapy Documentation Precautions:  Precautions Precautions: Fall Precaution Comments: h/o multiple falls, CHF and orthostatic hypotension Restrictions Weight Bearing Restrictions: No Vital Signs: Therapy Vitals Temp: 98 F (36.7 C) Temp Source: Oral Pulse Rate: (!) 57 Resp: 18 BP:  131/64 Patient Position (if appropriate): Lying Oxygen Therapy SpO2: 99 % O2 Device: Room Air Pain: Pain Assessment Pain Scale: 0-10 Pain Score: 0-No pain Pain Type: Acute pain Pain Location: Neck Pain Orientation: Posterior Pain Descriptors / Indicators: Aching;Sore Pain Onset: On-going Patients Stated Pain Goal: 0 Pain Intervention(s): Medication (See eMAR);RN made aware   Therapy/Group: Individual Therapy  Curtis Sites 03/11/2018, 3:50 PM

## 2018-03-11 NOTE — Consult Note (Signed)
Neuropsychological Consultation   Patient:   Theresa Ray   DOB:   September 03, 1945  MR Number:  [id number]  Location:  [facility] [place] [id number] [place] [address] Dept: [place]: [phone]           Date of Service:   03/11/2018  Start Time:   9 AM End Time:   10 AM  Provider/Observer:  Ilean Skill, Psy.D.       Clinical Neuropsychologist       Billing Code/Service: [phone] 4 Units  Chief Complaint:    Theresa Ray is a 72 year old right-handed female with a history of CAD/CABG in 2018.  The patient also has chronic diastolic congestive heart failure, diabetes, orthostatic hypotension.  The patient presented on 03/02/2018 with altered mental status and left-sided weakness.  CTA showed no large vessel occlusion and no TPA was administered.  The patient was transferred to [facility] and follow-up MRI showed multifocal acute bilateral small infarcts spanning multiple vascular territories.  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  The patient is now been admitted for comprehensive rehabilitation program.  The patient does have a past history of depression and have been doing very little physical activity or other engagement life.  There is been an increasing isolation with the patient over the past year.  The patient's husband reports that he has been concerned about her spending too much time sitting and watching TV and not engaging in life activities.  Reason for Service:  HPI: Theresa Ray is a 37 year old right-handed female with history of CAD/CABG 2018 maintained on aspirin and Plavix, chronic diastolic congestive heart failure, diabetes mellitus, orthostatic hypotension. Per chart review, patient and husband, patient lives with spouse. Independent prior to admission. One level home with 6-9 to entry.  Husband works during the day. Presented 03/02/2018 with altered mental status and left side weakness to [place] showed no large vessel occlusion. Patient did not receive TPA. Noted leukocytosis as well as creatinine 1.87 with CT of abdomen being completed showing left urteric obstruction secondary to kidney stone was taken to the OR for a double-J stent. She was then transferred to [facility] for further evaluation of left-sided weakness. MRI reviewed, showing bilateral infarcts. Per report, CT/MRI follow-up while at [facility] showed multifocal acute bilateral small infarct spanning multiple vascular territories. 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 Echocardiogram with ejection fraction of 55%. Systolic function was normal. TEE with no intracardiac source of emboli, negative bubble study without PFO and loop recorder placement 03/06/2018 . Follow-up urology services after recent placement of stent for kidney stone Dr.Dahlstedt and continue plan of care with follow-up urology services at [facility] did spike a low-grade fever blood cultures no growth to date, lactic acid 4.2 down to 2.0 and placed on Rocephin empirically changed Omnicef x four days..Leukocytosis improving from 22,600-12,000. Presently remains on aspirin and Plavix for CVA prophylaxis.Therapy evaluations completed with recommendations of physical medicine rehabilitation consult. Patient was admitted for a comprehensive rehabilitation program.  Current Status:  The patient is continuing to show significant cognitive deficits.  The patient's attention and focus abilities are severely impaired.  The patient's mental status is impaired for memory, executive functioning, attention concentration, and reasoning/problem-solving.  There is a significant reduction in mental processing speed.  The patient had  significant difficulty staying  organized with her thoughts.  She is learning some information but tended to perseverate on some of these issues.  Behavioral Observation: Theresa Ray  presents as a 72 y.o.-year-old Right Caucasian Female who appeared her stated age. her dress was Appropriate and she was Well Groomed and her manners were Appropriate to the situation.  her participation was indicative of Appropriate, Inattentive and Redirectable behaviors.  There were any physical disabilities noted.  she displayed an appropriate level of cooperation and motivation.     Interactions:    Active Appropriate, Inattentive and Redirectable  Attention:   abnormal and attention span appeared shorter than expected for age  Memory:   abnormal; global memory impairment noted  Visuo-spatial:  not examined  Speech (Volume):  low  Speech:   normal; slowed  Thought Process:  Coherent and Tangential  Though Content:  WNL; not suicidal and not homicidal  Orientation:   person and place  Judgment:   Poor  Planning:   Poor  Affect:    Flat  Mood:    Dysphoric  Insight:   Shallow  Intelligence:   normal  Medical History:   Past Medical History:  Diagnosis Date  . CAD in native artery    a. s/p CABGx3 in 01/2017.  Marland Kitchen Chronic diastolic CHF (congestive heart failure) ([place])   . Diabetes mellitus ([place])   . Diabetic neuropathy ([place])   . History of kidney stones    history of stones  . Hypertension   . Hypothyroidism   . NSTEMI (non-ST elevated myocardial infarction) ([place])   . Orthostatic hypotension   . Postoperative atrial fibrillation ([place])    a. after CABG 01/2017.  Marland Kitchen TIA (transient ischemic attack)    Psychiatric History:  Patient's husband reports that there have been issues and concerns about depression  Family Med/Psych History:  Family History  Problem Relation Age of Onset  . Heart disease Mother 53       CABG  . CVA Father     Risk of Suicide/Violence: low patient denies any suicidal or homicidal  ideation.  Impression/DX:  Theresa Ray is a 72 year old right-handed female with a history of CAD/CABG in 2018.  The patient also has chronic diastolic congestive heart failure, diabetes, orthostatic hypotension.  The patient presented on 03/02/2018 with altered mental status and left-sided weakness.  CTA showed no large vessel occlusion and no TPA was administered.  The patient was transferred to [facility] and follow-up MRI showed multifocal acute bilateral small infarcts spanning multiple vascular territories.  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  The patient is now been admitted for comprehensive rehabilitation program.  The patient does have a past history of depression and have been doing very little physical activity or other engagement life.  There is been an increasing isolation with the patient over the past year.  The patient's husband reports that he has been concerned about her spending too much time sitting and watching TV and not engaging in life activities.  The patient is continuing to show significant cognitive deficits.  The patient's attention and focus abilities are severely impaired.  The patient's mental status is impaired for memory, executive functioning, attention concentration, and reasoning/problem-solving.  There is a significant reduction in mental processing speed.  The patient had significant difficulty staying organized with her thoughts.  She is learning some information but tended to perseverate on some of these issues.  Disposition/Plan:  Today we worked on adjustment issues around issues of her extended hospital care and significant medical issues.  The patient was generally flat in her interactions but did appear to understand and be aware enough to learn over time.          Electronically Signed   _______________________ Ilean Skill, Psy.D.

## 2018-03-11 NOTE — Patient Care Conference (Signed)
Inpatient RehabilitationTeam Conference and Plan of Care Update Date: 03/10/2018   Time: 2:55 PM    Patient Name: Theresa Ray      Medical Record Number: [id number]  Date of Birth: 31-Aug-1945 Sex: Female         Room/Bed: 4W21C/4W21C-01 Payor Info: Payor: [employer] MEDICARE / Plan: [place] MEDICARE HMO/PPO / Product Type: *No Product type* /    Admitting Diagnosis: bilateral infarcts  Admit Date/Time:  03/09/2018  6:29 PM Admission Comments: No comment available   Primary Diagnosis:  <principal problem not specified> Principal Problem: <principal problem not specified>  Patient Active Problem List   Diagnosis Date Noted  . 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 Hypothyroidism   . Dyslipidemia   . Leukocytosis   . AKI (acute kidney injury) ([place])   . Benign essential HTN   . Hydronephrosis with urinary obstruction due to renal calculus   . Chronic diastolic congestive heart failure ([place])   . Diabetes mellitus type 2 in obese ([place])   . Orthostasis   . Tachypnea   . Hypokalemia   . Severe sepsis ([place]) 03/02/2018  . Acute CVA (cerebrovascular accident) ([place])   . Pyelonephritis   . CAD (coronary artery disease) 05/12/2017  . Postoperative atrial fibrillation ([place]) 05/12/2017  . Orthostatic hypotension 03/28/2017  . Syncope 03/28/2017  . Tobacco abuse 03/18/2017  . Dizziness 03/18/2017  . Pressure injury of skin 02/26/2017  . NSTEMI (non-ST elevated myocardial infarction) ([place]) 02/16/2017  . Type 2 diabetes mellitus with diabetic neuropathy, without long-term current use of insulin ([place])   . Essential hypertension   . Hyperlipidemia     Expected Discharge Date: Expected Discharge Date: 03/24/18  Team Members Present: Physician leading conference: Dr. Alger Simons Social Worker Present: Lennart Pall, LCSW Nurse Present: Dorien Chihuahua, RN PT Present: Michaelene Song, PT OT Present: Cherylynn Ridges, OT SLP Present: Weston Anna, SLP PPS Coordinator present : Daiva Nakayama, RN, CRRN;Melissa Gertie Fey     Current Status/Progress Goal Weekly Team Focus  Medical   bilateral MCA infarcts, ?embolic, AKI resolving, HTN treated, diabetes 2  improve cognitive-perceptual awareness  bp control, treatment of ID considerations, nutrition, hydration   Bowel/Bladder   incontinent of bowel and bladder LBM 03-09-18  able to manage bowel and bladder with mod assistance, maintain regular bowel pattern  timed toileting q2-3, assess bowel pattern q shift   Swallow/Nutrition/ Hydration   Regular textures with thin liquids, Intermittent supervision  Mod I  tolerance of diet upgrade   ADL's   Eval Pending         Mobility   min/mod assist for functional transfers, bed mobility, stairs, and mod/max assist gait without AD  supervision bed mobility, transfers, balance, and gait; CGA for stairs, and min assist floor transfer  NMR for balance and postural control retraining, functional strengthening and endurance, gait, stair negotiation, safety, cognition   Communication             Safety/Cognition/ Behavioral Observations  Mod-Max A  Min A  attention, problem solving, awareness    Pain   pain managed with prn tylenol   pain <4  Assess pain q shift and prn   Skin   sutures to vagina   no skin issues  Assess skin q shift and prn    Rehab Goals Patient on target to meet rehab goals: Yes *See Care Plan and progress notes for long and short-term goals.     Barriers to Discharge  Current Status/Progress Possible Resolutions  Date Resolved   Physician    Medical stability        see medical progress notes      Nursing  Decreased caregiver support;Lack of/limited family support               PT                    OT                  SLP                SW                Discharge Planning/Teaching Needs:  Pt to d/c home with with spouse who will not be returning to work until the 1st of the year.  Spouse to provide 24/7 support and pt's siblings able to assist as  well.  Teaching needs TBD closer to d/c.   Team Discussion:  New eval;  bil CVA + new loop recorder.  Good motor fxn overall.  ST cleared pt for reg diet.  Poor attention, delayed and with severe cognitive impairments.  Min - mod assist on PT eval.  OT eval pending.  Revisions to Treatment Plan:  NA    Continued Need for Acute Rehabilitation Level of Care: The patient requires daily medical management by a physician with specialized training in physical medicine and rehabilitation for the following conditions: Daily direction of a multidisciplinary physical rehabilitation program to ensure safe treatment while eliciting the highest outcome that is of practical value to the patient.: Yes Daily medical management of patient stability for increased activity during participation in an intensive rehabilitation regime.: Yes Daily analysis of laboratory values and/or radiology reports with any subsequent need for medication adjustment of medical intervention for : Post surgical problems;Blood pressure problems;Neurological problems;Diabetes problems   I attest that I was present, lead the team conference, and concur with the assessment and plan of the team.   Joffre Lucks 03/11/2018, 9:35 AM

## 2018-03-11 NOTE — Progress Notes (Signed)
Speech Language Pathology Daily Session Note  Patient Details  Name: KELSI BENHAM MRN: [id number] Date of Birth: Sep 06, 1945  Today's Date: 03/11/2018 SLP Individual Time: 1130-1200 SLP Individual Time Calculation (min): 30 min  Short Term Goals: Week 1: SLP Short Term Goal 1 (Week 1): Patient will consume current diet with minimal overt s/s of aspiration with Mod I for use of swallowing strategies.  SLP Short Term Goal 2 (Week 1): Patient will demonstrate sustained attention to functional tasks for 10 minutes with Mod A verbal cues for redirection.  SLP Short Term Goal 3 (Week 1): Patient will initiate functional tasks with Min A verbal cues. SLP Short Term Goal 4 (Week 1): Patient will demonstrate functional problem solving for basic and familiar tasks with Mod A verbal cues.   Skilled Therapeutic Interventions: Skilled treatment session focused on cognition goals. SLP facilitated session by providing Max A cues to match cards (Blink) in simplest form. Despite this, pt was not able to perform d/t decreased attention to tasks. Pt was also fixated on finding her husband (he had gone back to room). Pt unable to recall how many years she had been married. Husband able to facilitate recall. Pt left upright in wheelchair in room with husband present.      Pain Pain Assessment Pain Scale: 0-10 Pain Score: 0-No pain Pain Type: Acute pain Pain Location: Neck Pain Orientation: Posterior Pain Descriptors / Indicators: Aching;Sore Pain Onset: On-going Patients Stated Pain Goal: 0 Pain Intervention(s): Medication (See eMAR);RN made aware  Therapy/Group: Individual Therapy  Keiron Iodice 03/11/2018, 12:42 PM

## 2018-03-11 NOTE — Progress Notes (Signed)
Speech Language Pathology Daily Session Note  Patient Details  Name: Theresa Ray MRN: [id number] Date of Birth: November 15, 1945  Today's Date: 03/11/2018 SLP Individual Time: 1015-1100 SLP Individual Time Calculation (min): 45 min  Short Term Goals: Week 1: SLP Short Term Goal 1 (Week 1): Patient will consume current diet with minimal overt s/s of aspiration with Mod I for use of swallowing strategies.  SLP Short Term Goal 2 (Week 1): Patient will demonstrate sustained attention to functional tasks for 10 minutes with Mod A verbal cues for redirection.  SLP Short Term Goal 3 (Week 1): Patient will initiate functional tasks with Min A verbal cues. SLP Short Term Goal 4 (Week 1): Patient will demonstrate functional problem solving for basic and familiar tasks with Mod A verbal cues.   Skilled Therapeutic Interventions: Skilled treatment session focused on cognitive goals. SLP facilitated session by providing Max A verbal cues for sustained attention and Mod A verbal and visual cues for functional problem solving during a basic money management task. Patient's function impacted by fatigue and decreased mental flexibility. Patient left upright in wheelchair with alarm on and husband present. Continue with current plan of care.      Pain Pain Assessment Pain Scale: 0-10 Pain Score: 0-No pain Pain Type: Acute pain Pain Location: Neck Pain Orientation: Posterior Pain Descriptors / Indicators: Aching;Sore Pain Onset: On-going Patients Stated Pain Goal: 0 Pain Intervention(s): Medication (See eMAR);RN made aware  Therapy/Group: Individual Therapy  Demira Gwynne 03/11/2018, 2:59 PM

## 2018-03-11 NOTE — Progress Notes (Signed)
Physical Therapy Session Note  Patient Details  Name: Theresa Ray MRN: [id number] Date of Birth: 11-09-1945  Today's Date: 03/11/2018 PT Individual Time: 0800-0900 PT Individual Time Calculation (min): 60 min   Short Term Goals: Week 1:  PT Short Term Goal 1 (Week 1): Pt will perform bed mobility with min assist for supine <> sit PT Short Term Goal 2 (Week 1): Pt will be able to demonstrate functional dynamic standing balance with min assist PT Short Term Goal 3 (Week 1): Pt will be able to perform functional transfers with min assist PT Short Term Goal 4 (Week 1): Pt will be able to gait x 50' with min assist and LRAD PT Short Term Goal 5 (Week 1): Pt will be able to perform 8 stairs with rails with min assist  Skilled Therapeutic Interventions/Progress Updates:    Patient received in bed, very pleasant and willing to participate with therapy but requiring extended time for performance of all mobility and tasks this morning, limiting interventions performed this session. She required ModA for bed mobility, able to don R sock with S but required totalA to don L sock and to thread feet through pants; MinA for balance while doffing nightgown and donning sweatshirt. ModA to come to full stand with RW and VC for safety, able to pull pants up with MinA for balance and no UE support, and MinA to transfer into WC. She then requested to use bathroom and was able to perform toilet transfer with ModA, able to toilet with extended time and  S but required MinA for pericare. She then performed toilet transfer with RW and ModA and was left up in her [place] with all needs met and seat belt alarm active this morning.   Therapy Documentation Precautions:  Precautions Precautions: Fall Precaution Comments: h/o multiple falls, CHF and orthostatic hypotension Restrictions Weight Bearing Restrictions: No General:   Vital Signs:   Pain: Pain Assessment Pain Scale: 0-10 Pain Score: 8  Pain Type: Acute  pain Pain Location: Neck Pain Orientation: Posterior Pain Descriptors / Indicators: Aching;Sore Pain Onset: On-going Patients Stated Pain Goal: 0 Pain Intervention(s): Medication (See eMAR);RN made aware    Therapy/Group: Individual Therapy  Deniece Ree PT, DPT, CBIS  Supplemental Physical Therapist [facility]    Pager [phone] Acute Rehab Office [phone]   03/11/2018, 12:21 PM

## 2018-03-11 NOTE — Progress Notes (Signed)
[place] PHYSICAL MEDICINE & REHABILITATION PROGRESS NOTE   Subjective/Complaints: No problems overnight. Stated she wanted to go home. Perseverated on that slightly this morning. Husband at bedside  ROS: Limited due to cognitive/behavioral     Objective:   No results found. Recent Labs    03/10/18 0638  WBC 12.0*  HGB 11.3*  HCT 34.5*  PLT 574*   Recent Labs    03/10/18 0638 03/11/18 0707  NA 138 134*  K 3.7 3.9  CL 102 101  CO2 24 22  GLUCOSE 145* 194*  BUN 18 19  CREATININE 1.30* 1.31*  CALCIUM 8.7* 8.6*    Intake/Output Summary (Last 24 hours) at 03/11/2018 1244 Last data filed at 03/11/2018 0900 Gross per 24 hour  Intake 1080 ml  Output -  Net 1080 ml     Physical Exam: Vital Signs Blood pressure 138/69, pulse 64, temperature 98.9 F (37.2 C), temperature source Oral, resp. rate 18, height 5\' 3"  (1.6 m), weight 86.3 kg, SpO2 91 %.  Constitutional: No distress . Vital signs reviewed. HEENT: EOMI, oral membranes moist Neck: supple Cardiovascular: RRR without murmur. No JVD    Respiratory: CTA Bilaterally without wheezes or rales. Normal effort    GI: BS +, non-tender, non-distended  Musculoskeletal:  No edema or tenderness in extremities  Neurological: Alert with decreased insight and awareness. Delayed processing   Follows simple commands Motor: LUE: 4-4+/5 proximal to distal--stable LLE: HF, KE 3+/5, ADF 4/5 RUE: 4/5 proximal to distal--no change  RLE: HF, KE 3/5, ADF 3/5 Skin: Skin is warm and dry.  Psychiatric: alert, cooperative, sl anxious.     Assessment/Plan: 1. Functional deficits secondary to right MCA and left MCA infarcts which require 3+ hours per day of interdisciplinary therapy in a comprehensive inpatient rehab setting.  Physiatrist is providing close team supervision and 24 hour management of active medical problems listed below.  Physiatrist and rehab team continue to assess barriers to discharge/monitor patient progress  toward functional and medical goals  Care Tool:  Bathing    Body parts bathed by patient: Right arm, Left arm, Chest, Abdomen, Face   Body parts bathed by helper: Front perineal area, Buttocks, Right upper leg, Left upper leg, Right lower leg, Left lower leg     Bathing assist Assist Level: Maximal Assistance - Patient 24 - 49%     Upper Body Dressing/Undressing Upper body dressing   What is the patient wearing?: Dress    Upper body assist Assist Level: Maximal Assistance - Patient 25 - 49%    Lower Body Dressing/Undressing Lower body dressing      What is the patient wearing?: Incontinence brief     Lower body assist Assist for lower body dressing: Total Assistance - Patient < 25%     Toileting Toileting    Toileting assist Assist for toileting: Total Assistance - Patient < 25%     Transfers Chair/bed transfer  Transfers assist     Chair/bed transfer assist level: Minimal Assistance - Patient > 75%     Locomotion Ambulation   Ambulation assist      Assist level: Maximal Assistance - Patient 25 - 49% Assistive device: Hand held assist Max distance: 10'   Walk 10 feet activity   Assist     Assist level: Maximal Assistance - Patient 25 - 49%     Walk 50 feet activity   Assist Walk 50 feet with 2 turns activity did not occur: Safety/medical concerns  Walk 150 feet activity   Assist Walk 150 feet activity did not occur: Safety/medical concerns         Walk 10 feet on uneven surface  activity   Assist Walk 10 feet on uneven surfaces activity did not occur: Safety/medical concerns         Wheelchair     Assist Will patient use wheelchair at discharge?: No      Wheelchair assist level: Dependent - Patient 0%(likely will be primary ambulator)      Wheelchair 50 feet with 2 turns activity    Assist        Assist Level: Dependent - Patient 0%   Wheelchair 150 feet activity     Assist     Assist  Level: Dependent - Patient 0%    Medical Problem List and Plan: 1.  Left side weakness secondary to acute right MCA, MCA/PCA and left punctate MCA infarctions  -Continue CIR therapies including PT, OT   -loop recorder in place 2.  DVT Prophylaxis/Anticoagulation: SCDs. Monitor for any signs of DVT 3. Pain Management:  Tylenol as needed 4. Mood:  Celexa 10 mg daily. Provide emotional support 5. Neuropsych: This patient is capable of making decisions on her own behalf. 6. Skin/Wound Care:  Routine skin checks 7. Fluids/Electrolytes/Nutrition:  encourage PO  Fair intake   8.  Left ureteral stone with hydronephrosis. Status post stenting.  Follow-up urology services [facility]  -monitor urine output/Cr 9. CAD/CABG. Continue aspirin and Plavix 10. Hypertension. Lisinopril 5 mg daily, Coreg 12.5 mg twice a day, Norvasc 10 mg daily.    -fair control 12/11 11. Diabetes mellitus.Hemoglobin A1c 6.0.SSI.  patient on Glucotrol 10 mg twice a day and Glucophage 1000 mg twice a day prior to admission.     -checking cbgs ac and hs  -resume glucotrol 5mg  bid 12. AKI. Follow-up chemistries reviwewed and demonstrate improvement  I personally reviewed the patient's labs today.   13. Fever with leukocytosis. Blood cultures no growth to date. Complete course of OMNICEF. 14. Hyperlipidemia. Lipitor 15.  Hypothyroidism. Synthroid    LOS: 2 days A FACE TO FACE EVALUATION WAS PERFORMED  Meredith Staggers 03/11/2018, 12:44 PM

## 2018-03-12 ENCOUNTER — Inpatient Hospital Stay (HOSPITAL_COMMUNITY): Payer: Medicare HMO | Admitting: Physical Therapy

## 2018-03-12 ENCOUNTER — Inpatient Hospital Stay (HOSPITAL_COMMUNITY): Payer: Medicare HMO | Admitting: Speech Pathology

## 2018-03-12 ENCOUNTER — Inpatient Hospital Stay (HOSPITAL_COMMUNITY): Payer: Medicare HMO

## 2018-03-12 ENCOUNTER — Inpatient Hospital Stay (HOSPITAL_COMMUNITY): Payer: Medicare HMO | Admitting: Occupational Therapy

## 2018-03-12 LAB — BASIC METABOLIC PANEL
Anion gap: 11 (ref 5–15)
BUN: 19 mg/dL (ref 8–23)
CO2: 22 mmol/L (ref 22–32)
Calcium: 8.5 mg/dL — ABNORMAL LOW (ref 8.9–10.3)
Chloride: 102 mmol/L (ref 98–111)
Creatinine, Ser: 1.33 mg/dL — ABNORMAL HIGH (ref 0.44–1.00)
GFR calc Af Amer: 47 mL/min — ABNORMAL LOW (ref >60)
GFR calc non Af Amer: 41 mL/min — ABNORMAL LOW (ref >60)
Glucose, Bld: 135 mg/dL — ABNORMAL HIGH (ref 70–99)
Potassium: 4.1 mmol/L (ref 3.5–5.1)
Sodium: 135 mmol/L (ref 135–145)

## 2018-03-12 LAB — GLUCOSE, CAPILLARY
Glucose-Capillary: 134 mg/dL — ABNORMAL HIGH (ref 70–99)
Glucose-Capillary: 202 mg/dL — ABNORMAL HIGH (ref 70–99)
Glucose-Capillary: 201 mg/dL — ABNORMAL HIGH (ref 70–99)

## 2018-03-12 NOTE — Progress Notes (Signed)
Physical Therapy Session Note  Patient Details  Name: Theresa Ray MRN: [id number] Date of Birth: [birth year]  Today's Date: 03/12/2018 PT Individual Time: 1010-1103 PT Individual Time Calculation (min): 53 min   Short Term Goals: Week 1:  PT Short Term Goal 1 (Week 1): Pt will perform bed mobility with min assist for supine <> sit PT Short Term Goal 2 (Week 1): Pt will be able to demonstrate functional dynamic standing balance with min assist PT Short Term Goal 3 (Week 1): Pt will be able to perform functional transfers with min assist PT Short Term Goal 4 (Week 1): Pt will be able to gait x 50' with min assist and LRAD PT Short Term Goal 5 (Week 1): Pt will be able to perform 8 stairs with rails with min assist  Skilled Therapeutic Interventions/Progress Updates:     Patient received up in chair, very pleasant and willing to participate in therapy. Transported her totalA to hallway, where she was able to gait train 103f with min guard and RW, attempted second bout of gait however noted that patient had episode of incontinence and had her sit back down in WC/[facility] transfer, able to toilet with S and extended time, and MinA for pericare. Needed to change out brief and paper scrub pants so used this to work on functional strength and mobility with repeated sit to stands for changing out clothing, able to perform with Min guard and Mod cues for safety but fatigued after multiple transfers. She was then able to transfer back into her WC with min guard and RW, and used stand-pivot technique with MinA from therapist to return to bed. She was left in bed with all needs met and bed alarm activated this morning.   Therapy Documentation Precautions:  Precautions Precautions: Fall Precaution Comments: h/o multiple falls, CHF and orthostatic hypotension Restrictions Weight Bearing Restrictions: No General: PT Amount of Missed Time (min): 7 Minutes PT Missed  Treatment Reason: Other (Comment)(therapist running late from previous session ) Vital Signs:  Pain: Pain Assessment Pain Scale: 0-10 Pain Score: 8  Pain Type: Acute pain Pain Location: Neck Pain Orientation: Posterior Pain Descriptors / Indicators: Aching Pain Frequency: Intermittent Pain Onset: On-going Patients Stated Pain Goal: 0 Pain Intervention(s): Medication (See eMAR);Repositioned;Ambulation/increased activity Multiple Pain Sites: No    Therapy/Group: Individual Therapy   KDeniece ReePT, DPT, CBIS  Supplemental Physical Therapist [facility]   Pager [phone] Rehab Office [phone]  03/12/2018, 12:05 PM

## 2018-03-12 NOTE — Progress Notes (Signed)
Occupational Therapy Session Note  Patient Details  Name: Theresa Ray MRN: [id number] Date of Birth: Jan 26, 1946  Today's Date: 03/12/2018 OT Individual Time: 7628-3151 OT Individual Time Calculation (min): 29 min    Short Term Goals: Week 1:  OT Short Term Goal 1 (Week 1): Patient will complete bathing and dressing tasks with min A seated with support using appropriate ADs as needed OT Short Term Goal 2 (Week 1): Patient will complete funcitonal transfers with LRAD CG, no LOB and good awareness of left side OT Short Term Goal 3 (Week 1): Patient will identify safe strategies for adl tasks and recall 90% of newly learned techniques OT Short Term Goal 4 (Week 1): Patient will tolerate standing for 10 minutes of activity with fair + standing balance, and unsupported sitting with good balance  Skilled Therapeutic Interventions/Progress Updates:    Brief session focused on dressing sit <> stand. Pt received supine in bed agreeable to therapy with no c/o pain. Pt transitioned to EOB with heavy use of bed rails but no physical A. Pt maintained sitting balance EOB as she donned pants, min A for threading LLE through pants. Urinary incontinence present in brief. Set up/CGA for donning shirt. CGA for SPT to w/c. Pt left sitting up in w/c with all needs met, chair alarm belt donned.   Therapy Documentation Precautions:  Precautions Precautions: Fall Precaution Comments: h/o multiple falls, CHF and orthostatic hypotension Restrictions Weight Bearing Restrictions: No   Pain: Pain Assessment Pain Scale: 0-10 Pain Score: Asleep Pain Type: Chronic pain Pain Location: Generalized Pain Descriptors / Indicators: Aching Pain Frequency: Intermittent Pain Onset: On-going Patients Stated Pain Goal: 0 Pain Intervention(s): Medication (See eMAR);Repositioned   Therapy/Group: Individual Therapy  Curtis Sites 03/12/2018, 8:49 AM

## 2018-03-12 NOTE — Progress Notes (Signed)
Pt taken to toilet for BM and stent was dislodged and fell into the toilet. Alcide Goodness PAC, notified of event. No adverse response to removal noted. Margarito Liner

## 2018-03-12 NOTE — Progress Notes (Signed)
Social Work Patient ID: Theresa Ray, female   DOB: 07-19-1945, 72 y.o.   MRN: [id number]  Pt and spouse aware and agreeable with targeted d/c date of 12/24 and supervision/ CGA goals overall.  Spouse is able to provide 24/7 support.  Continue to follow.  Mariselda Badalamenti, LCSW

## 2018-03-12 NOTE — IPOC Note (Signed)
Overall Plan of Care [facility]) Patient Details Name: Theresa Ray MRN: [id number] DOB: 1946-03-23  Admitting Diagnosis: <principal problem not specified>bilateral MCA infarcts  Hospital Problems: Active Problems:   Middle cerebral artery syndrome   Major depression, chronic     Functional Problem List: Nursing Bladder, Bowel, Perception, Edema, Endurance, Safety, Motor  PT Balance, Edema, Endurance, Motor, Pain, Perception, Safety, Sensory, Skin Integrity  OT Balance, Cognition, Endurance, Motor, Perception, Safety, Vision  SLP Cognition, Nutrition  TR         Basic ADL's: OT Grooming, Bathing, Dressing, Toileting     Advanced  ADL's: OT       Transfers: PT Bed Mobility, Bed to Chair, Car, Furniture, Futures trader, Metallurgist: PT Ambulation, Stairs     Additional Impairments: OT    SLP Swallowing, Social Cognition   Problem Solving, Memory, Attention, Awareness  TR      Anticipated Outcomes Item Anticipated Outcome  Self Feeding    Swallowing  Mod I    Basic self-care  Supervision  Toileting  Supervision   Bathroom Transfers Supervision  Bowel/Bladder  Mod assist up to BSC/toilet  Transfers  supervision basic transfers; min assist floor  Locomotion  supervision gait and stairs  Communication     Cognition  Supervision  Pain  <3 on a 1-10 pain scale, assess q 4 hr  Safety/Judgment  mod assist x2 for transfers, call for assist   Therapy Plan: PT Intensity: Minimum of 1-2 x/day ,45 to 90 minutes PT Frequency: 5 out of 7 days PT Duration Estimated Length of Stay: 12-14 days OT Intensity: Minimum of 1-2 x/day, 45 to 90 minutes OT Frequency: 5 out of 7 days OT Duration/Estimated Length of Stay: 2 weeks SLP Intensity: Minumum of 1-2 x/day, 30 to 90 minutes SLP Frequency: 3 to 5 out of 7 days SLP Duration/Estimated Length of Stay: 12-14 days     Team Interventions: Nursing Interventions Patient/Family Education, Disease  Management/Prevention, Discharge Planning, Bladder Management, Psychosocial Support, Medication Management, Bowel Management  PT interventions Ambulation/gait training, Balance/vestibular training, Cognitive remediation/compensation, Community reintegration, Discharge planning, Disease management/prevention, DME/adaptive equipment instruction, Functional mobility training, Neuromuscular re-education, Pain management, Patient/family education, Psychosocial support, Skin care/wound management, Splinting/orthotics, Stair training, Therapeutic Activities, Therapeutic Exercise, UE/LE Strength taining/ROM, UE/LE Coordination activities, Visual/perceptual remediation/compensation, Wheelchair propulsion/positioning  OT Interventions Training and development officer, Academic librarian, Barrister's clerk education, Self Care/advanced ADL retraining, Therapeutic Exercise, UE/LE Coordination activities, Cognitive remediation/compensation, Discharge planning, DME/adaptive equipment instruction, Functional mobility training, Therapeutic Activities, UE/LE Strength taining/ROM, Visual/perceptual remediation/compensation  SLP Interventions Cognitive remediation/compensation, Dysphagia/aspiration precaution training, English as a second language teacher, Environmental controls, Functional tasks, Patient/family education, Therapeutic Activities, Internal/external aids  TR Interventions    SW/CM Interventions Discharge Planning, Psychosocial Support, Patient/Family Education   Barriers to Discharge MD  Medical stability  Nursing Decreased caregiver support, Lack of/limited family support    PT      OT      SLP      SW       Team Discharge Planning: Destination: PT-Home ,OT- Home , SLP-Home Projected Follow-up: PT-Home health PT, 24 hour supervision/assistance, Outpatient PT, OT-  Outpatient OT, SLP-Home Health SLP, 24 hour supervision/assistance, Outpatient SLP Projected Equipment Needs: PT-To be determined, OT- Tub/shower bench,  SLP-None recommended by SLP Equipment Details: PT-pt reports having access to RW, OT-  Patient/family involved in discharge planning: PT- Patient,  OT-Family member/caregiver, Patient, SLP-Patient  MD ELOS: 12-14 days Medical Rehab Prognosis:  Excellent Assessment: The patient has been admitted for CIR  therapies with the diagnosis of bilateral MCA infarcts. The team will be addressing functional mobility, strength, stamina, balance, safety, adaptive techniques and equipment, self-care, bowel and bladder mgt, patient and caregiver education, NMR, cognitve perceptual rx, pain control family e. Goals have been set at supervision for self-care, cognition, and mobility.    Meredith Staggers, MD, FAAPMR      See Team Conference Notes for weekly updates to the plan of care

## 2018-03-12 NOTE — Progress Notes (Signed)
[place] PHYSICAL MEDICINE & REHABILITATION PROGRESS NOTE   Subjective/Complaints: Taking meds. Working with SLP. Denies pain  ROS: Patient denies fever, rash, sore throat, blurred vision, nausea, vomiting, diarrhea, cough, shortness of breath or chest pain, joint or back pain, headache, or mood change.   Objective:   No results found. Recent Labs    03/10/18 0638  WBC 12.0*  HGB 11.3*  HCT 34.5*  PLT 574*   Recent Labs    03/11/18 0707 03/12/18 0513  NA 134* 135  K 3.9 4.1  CL 101 102  CO2 22 22  GLUCOSE 194* 135*  BUN 19 19  CREATININE 1.31* 1.33*  CALCIUM 8.6* 8.5*    Intake/Output Summary (Last 24 hours) at 03/12/2018 1311 Last data filed at 03/12/2018 0906 Gross per 24 hour  Intake 1200 ml  Output -  Net 1200 ml     Physical Exam: Vital Signs Blood pressure (!) 158/63, pulse 75, temperature 98.6 F (37 C), temperature source Oral, resp. rate 18, height 5\' 3"  (1.6 m), weight 86.2 kg, SpO2 97 %.  Constitutional: No distress . Vital signs reviewed. HEENT: EOMI, oral membranes moist Neck: supple Cardiovascular: RRR without murmur. No JVD    Respiratory: CTA Bilaterally without wheezes or rales. Normal effort    GI: BS +, non-tender, non-distended  Musculoskeletal:  No edema or tenderness in extremities  Neurological: Alert with decreased insight and awareness. Delayed processing   Follows simple commands. perseverates Motor: LUE: 4-4+/5 proximal to distal--no new issues LLE: HF, KE 3+/5, ADF 4/5 RUE: 4/5 proximal to distal--no change  RLE: HF, KE 3/5, ADF 3/5 Skin: Skin is warm and dry.  Psychiatric: restless     Assessment/Plan: 1. Functional deficits secondary to right MCA and left MCA infarcts which require 3+ hours per day of interdisciplinary therapy in a comprehensive inpatient rehab setting.  Physiatrist is providing close team supervision and 24 hour management of active medical problems listed below.  Physiatrist and rehab team  continue to assess barriers to discharge/monitor patient progress toward functional and medical goals  Care Tool:  Bathing    Body parts bathed by patient: Right arm, Left arm, Chest, Abdomen, Face   Body parts bathed by helper: Front perineal area, Buttocks, Right upper leg, Left upper leg, Right lower leg, Left lower leg     Bathing assist Assist Level: Maximal Assistance - Patient 24 - 49%     Upper Body Dressing/Undressing Upper body dressing   What is the patient wearing?: Pull over shirt    Upper body assist Assist Level: Contact Guard/Touching assist    Lower Body Dressing/Undressing Lower body dressing      What is the patient wearing?: Underwear/pull up, Pants     Lower body assist Assist for lower body dressing: Minimal Assistance - Patient > 75%     Toileting Toileting    Toileting assist Assist for toileting: Total Assistance - Patient < 25%     Transfers Chair/bed transfer  Transfers assist     Chair/bed transfer assist level: Contact Guard/Touching assist     Locomotion Ambulation   Ambulation assist      Assist level: Contact Guard/Touching assist Assistive device: Walker-rolling Max distance: 5ft   Walk 10 feet activity   Assist     Assist level: Contact Guard/Touching assist Assistive device: Walker-rolling   Walk 50 feet activity   Assist Walk 50 feet with 2 turns activity did not occur: Safety/medical concerns         Walk  150 feet activity   Assist Walk 150 feet activity did not occur: Safety/medical concerns         Walk 10 feet on uneven surface  activity   Assist Walk 10 feet on uneven surfaces activity did not occur: Safety/medical concerns         Wheelchair     Assist Will patient use wheelchair at discharge?: No      Wheelchair assist level: Dependent - Patient 0%(likely will be primary ambulator)      Wheelchair 50 feet with 2 turns activity    Assist        Assist Level:  Dependent - Patient 0%   Wheelchair 150 feet activity     Assist     Assist Level: Dependent - Patient 0%    Medical Problem List and Plan: 1.  Left side weakness secondary to acute right MCA, MCA/PCA and left punctate MCA infarctions  -Continue CIR therapies including PT, OT   -loop recorder in place 2.  DVT Prophylaxis/Anticoagulation: SCDs. Monitor for any signs of DVT 3. Pain Management:  Tylenol as needed 4. Mood:  Celexa 10 mg daily. Provide emotional support 5. Neuropsych: This patient is capable of making decisions on her own behalf. 6. Skin/Wound Care:  Routine skin checks 7. Fluids/Electrolytes/Nutrition:  encourage PO  Fair intake   8.  Left ureteral stone with hydronephrosis. Status post stenting.  Follow-up urology services [facility]  -monitor urine output/Cr 9. CAD/CABG. Continue aspirin and Plavix 10. Hypertension. Lisinopril 5 mg daily, Coreg 12.5 mg twice a day, Norvasc 10 mg daily.    -fair control 12/12 11. Diabetes mellitus.Hemoglobin A1c 6.0.SSI.  patient on Glucotrol 10 mg twice a day and Glucophage 1000 mg twice a day prior to admission.     -checking cbgs ac and hs  -resumed glucotrol 5mg  bid, borderline control 12. AKI. Follow-up chemistries reviwewed and demonstrate improvement  I personally reviewed the patient's labs today again. Push po 13. Fever with leukocytosis. Blood cultures no growth to date. Complete course of OMNICEF. 14. Hyperlipidemia. Lipitor 15.  Hypothyroidism. Synthroid    LOS: 3 days A FACE TO FACE EVALUATION WAS PERFORMED  Meredith Staggers 03/12/2018, 1:11 PM

## 2018-03-12 NOTE — Progress Notes (Signed)
Speech Language Pathology Daily Session Note  Patient Details  Name: Theresa Ray MRN: [id number] Date of Birth: 29-Jan-1946  Today's Date: 03/12/2018 SLP Individual Time: 0720-0805 SLP Individual Time Calculation (min): 45 min  Short Term Goals: Week 1: SLP Short Term Goal 1 (Week 1): Patient will consume current diet with minimal overt s/s of aspiration with Mod I for use of swallowing strategies.  SLP Short Term Goal 2 (Week 1): Patient will demonstrate sustained attention to functional tasks for 10 minutes with Mod A verbal cues for redirection.  SLP Short Term Goal 3 (Week 1): Patient will initiate functional tasks with Min A verbal cues. SLP Short Term Goal 4 (Week 1): Patient will demonstrate functional problem solving for basic and familiar tasks with Mod A verbal cues.   Skilled Therapeutic Interventions: Skilled treatment session focused on dysphagia and cognitive goals. SLP facilitated session by providing skilled observation with breakfast meal of regular textures with thin liquids. Patient consumed meal without overt s/s of aspiration and was Mod I for set-up and swallowing safety. Recommend patient continue current diet. SLP also facilitated session by providing Mod A verbal and visual cues for selective attention during tray set-up and self-feeding in a moderately distracting environment. Patient left upright in bed with alarm on and all needs within reach. Continue with current plan of care.      Pain No/Denies Pain  Therapy/Group: Individual Therapy  Kiran Lapine 03/12/2018, 3:34 PM

## 2018-03-12 NOTE — Progress Notes (Signed)
Patient's double-J stent dislodged while in the bathroom using the toilet. Patient in no pain or discomfort. Spoke with urology services at this time stent will remain out plan follow-up imaging per urology services. Patient will follow-up urology services at [facility] upon discharge

## 2018-03-12 NOTE — Progress Notes (Signed)
Social Work Social Work Assessment and Plan  Patient Details  Name: Theresa Ray MRN: [id number] Date of Birth: 1945-07-24  Today's Date: 03/12/2018  Problem List:  Patient Active Problem List   Diagnosis Date Noted  . Major depression, chronic   . 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 Hypothyroidism   . Dyslipidemia   . Leukocytosis   . AKI (acute kidney injury) ([place])   . Benign essential HTN   . Hydronephrosis with urinary obstruction due to renal calculus   . Chronic diastolic congestive heart failure ([place])   . Diabetes mellitus type 2 in obese ([place])   . Orthostasis   . Tachypnea   . Hypokalemia   . Severe sepsis ([place]) 03/02/2018  . Acute CVA (cerebrovascular accident) ([place])   . Pyelonephritis   . CAD (coronary artery disease) 05/12/2017  . Postoperative atrial fibrillation ([place]) 05/12/2017  . Orthostatic hypotension 03/28/2017  . Syncope 03/28/2017  . Tobacco abuse 03/18/2017  . Dizziness 03/18/2017  . Pressure injury of skin 02/26/2017  . NSTEMI (non-ST elevated myocardial infarction) ([place]) 02/16/2017  . Type 2 diabetes mellitus with diabetic neuropathy, without long-term current use of insulin ([place])   . Essential hypertension   . Hyperlipidemia    Past Medical History:  Past Medical History:  Diagnosis Date  . CAD in native artery    a. s/p CABGx3 in 01/2017.  Marland Kitchen Chronic diastolic CHF (congestive heart failure) ([place])   . Diabetes mellitus ([place])   . Diabetic neuropathy ([place])   . History of kidney stones    history of stones  . Hypertension   . Hypothyroidism   . NSTEMI (non-ST elevated myocardial infarction) ([place])   . Orthostatic hypotension   . Postoperative atrial fibrillation ([place])    a. after CABG 01/2017.  Marland Kitchen TIA (transient ischemic attack)    Past Surgical History:  Past Surgical History:  Procedure Laterality Date  . ABDOMINAL HYSTERECTOMY    . CORONARY ARTERY BYPASS GRAFT N/A 02/24/2017   Procedure: CORONARY ARTERY BYPASS GRAFTING  (CABG) USING LEFT INTERNAL MAMMARY ARTERY TO LAD AND ENDOSCOPICALLY HARVESTED GREATER SAPHENOUS VEIN TO PDA AND TO OM1.;  Surgeon: Grace Isaac, MD;  Location: [facility];  Service: Open Heart Surgery;  Laterality: N/A;  . ENDOVEIN HARVEST OF GREATER SAPHENOUS VEIN Right 02/24/2017   Procedure: ENDOVEIN HARVEST OF GREATER SAPHENOUS VEIN;  Surgeon: Grace Isaac, MD;  Location: [facility];  Service: Open Heart Surgery;  Laterality: Right;  . LAPAROSCOPIC GASTRIC BANDING    . LEFT HEART CATH AND CORONARY ANGIOGRAPHY N/A 02/18/2017   Procedure: LEFT HEART CATH AND CORONARY ANGIOGRAPHY;  Surgeon: Leonie Man, MD;  Location: [facility];  Service: Cardiovascular;  Laterality: N/A;  . LOOP RECORDER INSERTION N/A 03/06/2018   Procedure: LOOP RECORDER INSERTION;  Surgeon: Constance Haw, MD;  Location: [facility];  Service: Cardiovascular;  Laterality: N/A;  . TEE WITHOUT CARDIOVERSION N/A 02/24/2017   Procedure: TRANSESOPHAGEAL ECHOCARDIOGRAM (TEE);  Surgeon: Grace Isaac, MD;  Location: [facility];  Service: Open Heart Surgery;  Laterality: N/A;  . TEE WITHOUT CARDIOVERSION N/A 03/06/2018   Procedure: TRANSESOPHAGEAL ECHOCARDIOGRAM (TEE);  Surgeon: Dorothy Spark, MD;  Location: [facility];  Service: Cardiovascular;  Laterality: N/A;  loop   Social History:  reports that she quit smoking about 44 years ago. Her smoking use included cigarettes. She started smoking about 52 years ago. She has never used smokeless tobacco. She reports that she does not drink alcohol or  use drugs.  Family / Support Systems Marital Status: Married Patient Roles: Spouse Spouse/Significant Other: spouse, Oswin Johal @ [phone] Children: none Anticipated Caregiver: husband; per husband, his sister and borhter can sit in with her and look in on her PRN if needed Ability/Limitations of Caregiver: Min A Caregiver Availability: Other (Comment)( (husband not working til 1st of year, will  re-eval assist)) Family Dynamics: Husband very supportive and encouraging to pt and notes "I'll make sure she is taken care of."  He also notes that pt is close with his sister as well.  Social History Preferred language: English Religion: [employer] Cultural Background: NA Read: Yes Write: Yes Employment Status: Retired Date Retired/Disabled/Unemployed: May 2019 she was "let go" from her job. Legal History/Current Legal Issues: None Guardian/Conservator: None - per MD, pt is capable of making decisions on her own behalf.   Abuse/Neglect Abuse/Neglect Assessment Can Be Completed: Yes Physical Abuse: Denies Verbal Abuse: Denies Sexual Abuse: Denies Exploitation of patient/patient's resources: Denies Self-Neglect: Denies  Emotional Status Pt's affect, behavior and adjustment status: Pt is pleasant and able to complete basic assessement interview process, however, can become tangential and require redirection at times.  She denies any significant emotional distress, however, was seen by neuropsychology yesterday for additional support.   Recent Psychosocial Issues: Husband reports that pt was "Let go" in the spring of this year and feels this has caused depression in pt and notes she has been very sedentary ever since. Psychiatric History: Husband suspects pt with depression since loss of job. Substance Abuse History: None  Patient / Family Perceptions, Expectations & Goals Pt/Family understanding of illness & functional limitations: Pt and spouse with good, general understanding of her medical issues, CVA and current functional limitations/ need for CIR. Premorbid pt/family roles/activities: Pt and spouse agree that she was very sedentary PTA which spouse feels was caused by depression.  Pt admits that she "sat a lot... didn't do much..." Anticipated changes in roles/activities/participation: Spouse will need to provide closer support at d/c and he is agreeable with  this. Pt/family expectations/goals: "I just want to get home for Christmas."  [employer]: None Premorbid Home Care/DME Agencies: None Transportation available at discharge: yes Resource referrals recommended: Neuropsychology, Support group (specify)  Discharge Planning Living Arrangements: Spouse/significant other Support Systems: Spouse/significant other, Other relatives, Friends/neighbors Type of Residence: Private residence Insurance Resources: Medicare([employer]na Medicare) Financial Resources: Radio broadcast assistant Screen Referred: No Living Expenses: Own Money Management: Spouse Does the patient have any problems obtaining your medications?: No Home Management: mostly spouse Patient/Family Preliminary Plans: Pt to d/c home with spouse providing any needed assistance. Social Work Anticipated Follow Up Needs: HH/OP Expected length of stay: 12-14 days  Clinical Impression Pleasant, talkative woman here following stent placement with CVA after.  Able to complete assessment interview, however, required some redirection at times.  Pt denies any significant emotional distress, however, spouse feels she is "been depressed" since she was "let go" from her job in the Spring.  Pt admits very sedentary PTA.  Will follow for support and d/c planning needs.  Coco Sharpnack 03/12/2018, 4:41 PM

## 2018-03-12 NOTE — Progress Notes (Signed)
Occupational Therapy Session Note  Patient Details  Name: Theresa Ray MRN: [id number] Date of Birth: Apr 17, 1945  Today's Date: 03/12/2018 OT Individual Time: 3291-9166 OT Individual Time Calculation (min): 57 min    Short Term Goals: Week 1:  OT Short Term Goal 1 (Week 1): Patient will complete bathing and dressing tasks with min A seated with support using appropriate ADs as needed OT Short Term Goal 2 (Week 1): Patient will complete funcitonal transfers with LRAD CG, no LOB and good awareness of left side OT Short Term Goal 3 (Week 1): Patient will identify safe strategies for adl tasks and recall 90% of newly learned techniques OT Short Term Goal 4 (Week 1): Patient will tolerate standing for 10 minutes of activity with fair + standing balance, and unsupported sitting with good balance  Skilled Therapeutic Interventions/Progress Updates:    Pt greeted semi-reclined in bed and agreeable to OT treatment session. Pt slow to initiate coming to sitting EOB with CGA. Pt with difficulty motor planning stand-pivot requiring verbal and tactile cues to initiate turning. Worked on standing balance and motor planning with wii bowling activity. Min/CGA for balance when swinging R UE. Pt needed verbal cues at each set to recall how to "roll" bowling ball using wii remote. Pt reported need for bathroom and was returned to room via wc for urgency. Stand-pivot to commode with min A and increased time. Verbal cues to remove pants prior to sitting. Pt with successful BM and voided bladder. Worked on pt completing peri-care with set-up A. Upon pt standing, ureter string noted to be in the commode after using the bathroom, nursing notified. Pt requests to returned to bed and completed stand-pivot back to bed with min A. Pt left semi-reclined in bed with call bell in reach, bed alarm on, and needs met.   Therapy Documentation Precautions:  Precautions Precautions: Fall Precaution Comments: h/o multiple falls,  CHF and orthostatic hypotension Restrictions Weight Bearing Restrictions: No Pain: Pain Assessment Pain Scale: 0-10 Pain Score: none/denies pain  Therapy/Group: Individual Therapy  Valma Cava 03/12/2018, 1:56 PM

## 2018-03-12 NOTE — Consult Note (Signed)
I was consult on this woman last week.  She had urgent placement of a stent at  [facility] prior to her transfer here  For management of a CVA.  She had a left upper ureteral stone.  She is on rehab now, perhaps discharge next week.  She had her stent fall out during a bowel movement today.  She is currently in no pain.    I would recommend performing a KUB to check on stone location tomorrow.  If she develops pain, I would consider replacing the stent, and please contact us.    I will be putting the order in for the KUB and will check on that tomorrow.

## 2018-03-12 NOTE — Care Management (Signed)
Inpatient [facility] Individual Statement of Services  Patient Name:  Theresa Ray  Date:  03/12/2018  Welcome to the [facility].  Our goal is to provide you with an individualized program based on your diagnosis and situation, designed to meet your specific needs.  With this comprehensive rehabilitation program, you will be expected to participate in at least 3 hours of rehabilitation therapies Monday-Friday, with modified therapy programming on the weekends.  Your rehabilitation program will include the following services:  Physical Therapy (PT), Occupational Therapy (OT), Speech Therapy (ST), 24 hour per day rehabilitation nursing, Therapeutic Recreaction (TR), Neuropsychology, Case Management (Social Worker), Rehabilitation Medicine, Nutrition Services and Pharmacy Services  Weekly team conferences will be held on Tuesdays to discuss your progress.  Your Social Worker will talk with you frequently to get your input and to update you on team discussions.  Team conferences with you and your family in attendance may also be held.  Expected length of stay: 12-14 days   Overall anticipated outcome: supervision  Depending on your progress and recovery, your program may change. Your Social Worker will coordinate services and will keep you informed of any changes. Your Social Worker's name and contact numbers are listed  below.  The following services may also be recommended but are not provided by the [place] will be made to provide these services after discharge if needed.  Arrangements include referral to agencies that provide these services.  Your insurance has been verified to be:  [employer] Your primary doctor is:  Helene Kelp  Pertinent information will be shared with your doctor and your insurance company.  Social  Worker:  [place], [place] or ([phone]   Information discussed with and copy given to patient by: Lennart Pall, 03/12/2018, 4:45 PM

## 2018-03-13 ENCOUNTER — Inpatient Hospital Stay (HOSPITAL_COMMUNITY): Payer: Medicare HMO

## 2018-03-13 ENCOUNTER — Inpatient Hospital Stay (HOSPITAL_COMMUNITY): Payer: Medicare HMO | Admitting: Occupational Therapy

## 2018-03-13 ENCOUNTER — Inpatient Hospital Stay (HOSPITAL_COMMUNITY): Payer: Medicare HMO | Admitting: Physical Therapy

## 2018-03-13 ENCOUNTER — Inpatient Hospital Stay (HOSPITAL_COMMUNITY): Payer: Medicare HMO | Admitting: Speech Pathology

## 2018-03-13 LAB — GLUCOSE, CAPILLARY
GLUCOSE-CAPILLARY: 180 mg/dL — AB (ref 70–99)
GLUCOSE-CAPILLARY: 191 mg/dL — AB (ref 70–99)
Glucose-Capillary: 321 mg/dL — ABNORMAL HIGH (ref 70–99)
Glucose-Capillary: 160 mg/dL — ABNORMAL HIGH (ref 70–99)
Glucose-Capillary: 134 mg/dL — ABNORMAL HIGH (ref 70–99)
Glucose-Capillary: 218 mg/dL — ABNORMAL HIGH (ref 70–99)
Glucose-Capillary: 163 mg/dL — ABNORMAL HIGH (ref 70–99)

## 2018-03-13 NOTE — Progress Notes (Signed)
Speech Language Pathology Daily Session Note  Patient Details  Name: Theresa Ray MRN: [id number] Date of Birth: [birth year]  Today's Date: 03/13/2018 SLP Individual Time: 0900-1000 SLP Individual Time Calculation (min): 60 min  Short Term Goals: Week 1: SLP Short Term Goal 1 (Week 1): Patient will consume current diet with minimal overt s/s of aspiration with Mod I for use of swallowing strategies.  SLP Short Term Goal 2 (Week 1): Patient will demonstrate sustained attention to functional tasks for 10 minutes with Mod A verbal cues for redirection.  SLP Short Term Goal 3 (Week 1): Patient will initiate functional tasks with Min A verbal cues. SLP Short Term Goal 4 (Week 1): Patient will demonstrate functional problem solving for basic and familiar tasks with Mod A verbal cues.   Skilled Therapeutic Interventions: Skilled treatment session focused on cognitive goals. SLP facilitated session by transferring the patient from the bed to the commode with extra time and overall Min A verbal cues for problem solving and safety. Patient continent of both bowel and bladder. SLP also facilitated session with a medication management task. Patient independently recalled 80% of her medications and required overall Min A verbal cues for problem solving during a BID pill box organization task.  Task will be completed at next session. Patient left upright in wheelchair with alarm on and all needs within reach. Continue with current plan of care.      Pain No/Denies Pain   Therapy/Group: Individual Therapy  Terion Hedman 03/13/2018, 3:43 PM

## 2018-03-13 NOTE — Progress Notes (Signed)
[facility] PHYSICAL MEDICINE & REHABILITATION PROGRESS NOTE   Subjective/Complaints: Had fair night. Denies pain this morning. Emptying bladder without issue  ROS: Patient denies fever, rash, sore throat, blurred vision, nausea, vomiting, diarrhea, cough, shortness of breath or chest pain, joint or back pain, headache, or mood change.   Objective:   Dg Abd 1 View  Result Date: 03/13/2018 CLINICAL DATA:  Abdominal pain, ureteral calculus EXAM: ABDOMEN - 1 VIEW COMPARISON:  CT abdomen and pelvis 03/02/2018 FINDINGS: Reservoir and tubing from a laparoscopic gastric band project over upper abdomen. 2 adjacent calculi project over LEFT kidney, 8 x 5 mm and 3 x 3 mm. Atherosclerotic calcifications and small phleboliths in pelvis. No definite ureteral calcification visualized. Bowel gas pattern normal. Osseous demineralization with degenerative changes of the lumbar spine. IMPRESSION: 2 LEFT renal calculi as above. Electronically Signed   By: Lavonia Dana M.D.   On: 03/13/2018 08:32   No results for input(s): WBC, HGB, HCT, PLT in the last 72 hours. Recent Labs    03/11/18 0707 03/12/18 0513  NA 134* 135  K 3.9 4.1  CL 101 102  CO2 22 22  GLUCOSE 194* 135*  BUN 19 19  CREATININE 1.31* 1.33*  CALCIUM 8.6* 8.5*    Intake/Output Summary (Last 24 hours) at 03/13/2018 1335 Last data filed at 03/13/2018 1225 Gross per 24 hour  Intake 960 ml  Output -  Net 960 ml     Physical Exam: Vital Signs Blood pressure (!) 159/67, pulse (!) 55, temperature 98.1 F (36.7 C), temperature source Oral, resp. rate 18, height 5\' 3"  (1.6 m), weight 88.3 kg, SpO2 96 %.  Constitutional: No distress . Vital signs reviewed. HEENT: EOMI, oral membranes moist Neck: supple Cardiovascular: RRR without murmur. No JVD    Respiratory: CTA Bilaterally without wheezes or rales. Normal effort    GI: BS +, non-tender, non-distended  Musculoskeletal:  No edema or tenderness in extremities  Neurological: Alert  with decreased insight and awareness. Delayed processing   Follows simple commands. Perseverates easily Motor: LUE: 4-4+/5 proximal to distal--no new issues LLE: HF, KE 3+/5, ADF 4/5 RUE: 4/5 proximal to distal--stable RLE: HF, KE 3/5, ADF 3/5 Skin: Skin is warm and dry.  Psychiatric: pleasant    Assessment/Plan: 1. Functional deficits secondary to right MCA and left MCA infarcts which require 3+ hours per day of interdisciplinary therapy in a comprehensive inpatient rehab setting.  Physiatrist is providing close team supervision and 24 hour management of active medical problems listed below.  Physiatrist and rehab team continue to assess barriers to discharge/monitor patient progress toward functional and medical goals  Care Tool:  Bathing    Body parts bathed by patient: Right arm, Left arm, Chest, Abdomen, Face   Body parts bathed by helper: Front perineal area, Buttocks, Right upper leg, Left upper leg, Right lower leg, Left lower leg     Bathing assist Assist Level: Maximal Assistance - Patient 24 - 49%     Upper Body Dressing/Undressing Upper body dressing   What is the patient wearing?: Pull over shirt    Upper body assist Assist Level: Contact Guard/Touching assist    Lower Body Dressing/Undressing Lower body dressing      What is the patient wearing?: Underwear/pull up, Pants     Lower body assist Assist for lower body dressing: Minimal Assistance - Patient > 75%     Toileting Toileting    Toileting assist Assist for toileting: Total Assistance - Patient < 25%  Transfers Chair/bed transfer  Transfers assist     Chair/bed transfer assist level: Contact Guard/Touching assist     Locomotion Ambulation   Ambulation assist      Assist level: Contact Guard/Touching assist Assistive device: Walker-rolling Max distance: 45ft   Walk 10 feet activity   Assist     Assist level: Contact Guard/Touching assist Assistive device:  Walker-rolling   Walk 50 feet activity   Assist Walk 50 feet with 2 turns activity did not occur: Safety/medical concerns         Walk 150 feet activity   Assist Walk 150 feet activity did not occur: Safety/medical concerns         Walk 10 feet on uneven surface  activity   Assist Walk 10 feet on uneven surfaces activity did not occur: Safety/medical concerns         Wheelchair     Assist Will patient use wheelchair at discharge?: No      Wheelchair assist level: Dependent - Patient 0%(likely will be primary ambulator)      Wheelchair 50 feet with 2 turns activity    Assist        Assist Level: Dependent - Patient 0%   Wheelchair 150 feet activity     Assist     Assist Level: Dependent - Patient 0%    Medical Problem List and Plan: 1.  Left side weakness secondary to acute right MCA, MCA/PCA and left punctate MCA infarctions  -Continue CIR therapies including PT, OT   -loop recorder in place 2.  DVT Prophylaxis/Anticoagulation: SCDs. Monitor for any signs of DVT 3. Pain Management:  Tylenol as needed 4. Mood:  Celexa 10 mg daily. Provide emotional support 5. Neuropsych: This patient is capable of making decisions on her own behalf. 6. Skin/Wound Care:  Routine skin checks 7. Fluids/Electrolytes/Nutrition:  encourage PO  Fair intake   8.  Left ureteral stone with hydronephrosis. Status post stenting.     -stent fell out yesterday  -KUB today stable, pt appears comfortable, bladder emptying  -urology following 9. CAD/CABG. Continue aspirin and Plavix 10. Hypertension. Lisinopril 5 mg daily, Coreg 12.5 mg twice a day, Norvasc 10 mg daily.    -fair control 12/12 11. Diabetes mellitus.Hemoglobin A1c 6.0.SSI.  patient on Glucotrol 10 mg twice a day and Glucophage 1000 mg twice a day prior to admission.     -checking cbgs ac and hs  -resumed glucotrol 5mg  bid, borderline control at present 12. AKI. Follow-up chemistries reviwewed and  demonstrate improvement  Encourage PO 13. Fever with leukocytosis. Blood cultures no growth to date. Complete course of OMNICEF. 14. Hyperlipidemia. Lipitor 15.  Hypothyroidism. Synthroid    LOS: 4 days A FACE TO FACE EVALUATION WAS PERFORMED  Meredith Staggers 03/13/2018, 1:35 PM

## 2018-03-13 NOTE — Progress Notes (Signed)
Occupational Therapy Session Note  Patient Details  Name: Theresa Ray MRN: [id number] Date of Birth: 06/11/1945  Today's Date: 03/13/2018 OT Individual Time: 1102-1200 OT Individual Time Calculation (min): 58 min   Short Term Goals: Week 1:  OT Short Term Goal 1 (Week 1): Patient will complete bathing and dressing tasks with min A seated with support using appropriate ADs as needed OT Short Term Goal 2 (Week 1): Patient will complete funcitonal transfers with LRAD CG, no LOB and good awareness of left side OT Short Term Goal 3 (Week 1): Patient will identify safe strategies for adl tasks and recall 90% of newly learned techniques OT Short Term Goal 4 (Week 1): Patient will tolerate standing for 10 minutes of activity with fair + standing balance, and unsupported sitting with good balance  Skilled Therapeutic Interventions/Progress Updates:    Pt greeted sitting in wc and agreeable to OT treatment session focused on bathing/dressing tasks. Pt declined need to go to the bathroom and completed stand-pivot to shower bench with min A and verbal cues to initiate stand and pivot. Pt perseverative on washing face in shower requiring verbal cues to move on to next body part. All movement are slow. Dressing completed with min A to pull on socks and verbal cues to initiate. Standing balance/endurance with B UE coordination task of drying hair. Pt with hair dryer I nR hand and brush in the L. Min A for balance 2/2 posterior lean with fatigue. Pt tolerated standing for 1.5 mins before needing to sit to finish task. Pt left seated in wc at end of session with lunch tray set-up, alarm belt on, and needs met.   Therapy Documentation Precautions:  Precautions Precautions: Fall Precaution Comments: h/o multiple falls, CHF and orthostatic hypotension Restrictions Weight Bearing Restrictions: No Pain: Pain Assessment Pain Scale: 0-10 Pain Score: 0-No pain   Therapy/Group: Individual  Therapy  Elisabeth S Doe 03/13/2018, 11:40 AM 

## 2018-03-13 NOTE — Progress Notes (Signed)
Physical Therapy Session Note  Patient Details  Name: Theresa Ray MRN: [id number] Date of Birth: [birth year]  Today's Date: 03/13/2018 PT Individual Time: 1400-1500 PT Individual Time Calculation (min): 60 min   Short Term Goals: Week 1:  PT Short Term Goal 1 (Week 1): Pt will perform bed mobility with min assist for supine <> sit PT Short Term Goal 2 (Week 1): Pt will be able to demonstrate functional dynamic standing balance with min assist PT Short Term Goal 3 (Week 1): Pt will be able to perform functional transfers with min assist PT Short Term Goal 4 (Week 1): Pt will be able to gait x 50' with min assist and LRAD PT Short Term Goal 5 (Week 1): Pt will be able to perform 8 stairs with rails with min assist  Skilled Therapeutic Interventions/Progress Updates:    Pt received seated in WC and agreeable to PT.   Gait training throughout session x43f, x25f x1020fith RW, min assist. Distance for gait limited by pt report of fatigue. Verbal cues for increasing step length and manual facilitation for L weight shift. Stand pivot transfer with RW throughout session x3 with min assist. Pt is CGA-min A for sit to stand but requires cues for hand placement and LE/AD sequencing.  Nustep [id number] verbal cues required for pt attention to task. Verbal cues for increasing speed of exercise. Stand pivot to RW with min assist.   Horseshoe toss x7 with short rest break after 3 tosses due to fatigue. Pt utilized RUE on RW and LUE tossing, CGA for exercise. Verbal cues to avoid leaning on RW when fatigued.  Self propelled WC x30ft 63f min assist for direction of WC and max verbal cues for attention to task.  Ended session with pt seated in WC, call bell within reach, and all needs met.   Therapy Documentation Precautions:  Precautions Precautions: Fall Precaution Comments: h/o multiple falls, CHF and orthostatic hypotension Restrictions Weight Bearing Restrictions: No Vital  Signs: Therapy Vitals Temp: 97.9 F (36.6 C) Pulse Rate: 68 Resp: 18 BP: (!) 144/62 Patient Position (if appropriate): Sitting Oxygen Therapy SpO2: 100 % O2 Device: Room Air Pain:  0/10   Therapy/Group: Individual Therapy  AmandaAmador Cunas/2019, 5:20 PM

## 2018-03-14 ENCOUNTER — Inpatient Hospital Stay (HOSPITAL_COMMUNITY): Payer: Medicare HMO | Admitting: Occupational Therapy

## 2018-03-14 ENCOUNTER — Inpatient Hospital Stay (HOSPITAL_COMMUNITY): Payer: Medicare HMO | Admitting: Speech Pathology

## 2018-03-14 ENCOUNTER — Inpatient Hospital Stay (HOSPITAL_COMMUNITY): Payer: Medicare HMO | Admitting: Physical Therapy

## 2018-03-14 LAB — URINALYSIS, ROUTINE W REFLEX MICROSCOPIC
Bilirubin Urine: NEGATIVE
Glucose, UA: NEGATIVE mg/dL
Ketones, ur: NEGATIVE mg/dL
Leukocytes, UA: NEGATIVE
Nitrite: NEGATIVE
Protein, ur: NEGATIVE mg/dL
SPECIFIC GRAVITY, URINE: 1.005 (ref 1.005–1.030)
pH: 7 (ref 5.0–8.0)

## 2018-03-14 LAB — GLUCOSE, CAPILLARY
Glucose-Capillary: 138 mg/dL — ABNORMAL HIGH (ref 70–99)
Glucose-Capillary: 191 mg/dL — ABNORMAL HIGH (ref 70–99)
Glucose-Capillary: 178 mg/dL — ABNORMAL HIGH (ref 70–99)
Glucose-Capillary: 122 mg/dL — ABNORMAL HIGH (ref 70–99)

## 2018-03-14 MED ORDER — VALACYCLOVIR HCL 500 MG PO TABS
1000.0000 mg | ORAL_TABLET | Freq: Every day | ORAL | Status: AC
  Administered 2018-03-14 – 2018-03-20 (×7): 1000 mg via ORAL
  Filled 2018-03-14 (×7): qty 2

## 2018-03-14 NOTE — Progress Notes (Signed)
Occupational Therapy Session Note  Patient Details  Name: Theresa Ray MRN: [id number] Date of Birth: 1945/07/05  Today's Date: 03/14/2018 OT Individual Time: 0900-1015 OT Individual Time Calculation (min): 75 min    Short Term Goals: Week 1:  OT Short Term Goal 1 (Week 1): Patient will complete bathing and dressing tasks with min A seated with support using appropriate ADs as needed OT Short Term Goal 2 (Week 1): Patient will complete funcitonal transfers with LRAD CG, no LOB and good awareness of left side OT Short Term Goal 3 (Week 1): Patient will identify safe strategies for adl tasks and recall 90% of newly learned techniques OT Short Term Goal 4 (Week 1): Patient will tolerate standing for 10 minutes of activity with fair + standing balance, and unsupported sitting with good balance  Skilled Therapeutic Interventions/Progress Updates:    Treatment session with focus on ADL retraining with functional transfers and safe strategies during self-care tasks of bathing and dressing.  Pt received upright in w/c agreeable to bathing at shower level.  Pt ambulated to room shower with RW with CGA and min cues for sequencing of transfer to tub bench. Therapist providing cues throughout session for increased safety awareness and to utilize shower bench for energy conservation and safety as pt reports she has "passed out" in shower before.  Educated on managing room and shower temperature and use of seat in shower upon d/c - pt will benefit from tub transfer bench.  Pt required increased time for initiation and sequencing throughout bathing, requiring mod-max cues from a time management stand point.  Pt completed dressing with setup assist and therapist assisting with donning socks for time management. Pt completed grooming tasks in sitting with use of BUE.  Pt reports need to toilet, therefore completed stand pivot transfer w/c <> toilet  with CGA and assistance to manage incontinence brief up and down  over hips.  Pt left seated in w/c with seat belt alarm on and all needs in reach.  Therapy Documentation Precautions:  Precautions Precautions: Fall Precaution Comments: h/o multiple falls, CHF and orthostatic hypotension Restrictions Weight Bearing Restrictions: No Pain: Pain Assessment Pain Scale: 0-10 Pain Score: 0-No pain   Therapy/Group: Individual Therapy  Simonne Come 03/14/2018, 12:23 PM

## 2018-03-14 NOTE — Progress Notes (Signed)
Physical Therapy Session Note  Patient Details  Name: Theresa Ray MRN: [id number] Date of Birth: 1945-06-15  Today's Date: 03/14/2018 PT Individual Time: 1500-1600 PT Individual Time Calculation (min): 60 min   Short Term Goals: Week 1:  PT Short Term Goal 1 (Week 1): Pt will perform bed mobility with min assist for supine <> sit PT Short Term Goal 2 (Week 1): Pt will be able to demonstrate functional dynamic standing balance with min assist PT Short Term Goal 3 (Week 1): Pt will be able to perform functional transfers with min assist PT Short Term Goal 4 (Week 1): Pt will be able to gait x 50' with min assist and LRAD PT Short Term Goal 5 (Week 1): Pt will be able to perform 8 stairs with rails with min assist  Skilled Therapeutic Interventions/Progress Updates:  Pt received sitting in WC and agreeable to PT. Pt reports need for urination. Stand pivot transfer to [facility] over toilet with min assist and UE support on rial in bathroom. Pt able to void. Pt performed pericare with supervision assist while standing at toilet. WC mobility with min assist from PT x 40f with moderate cues for equal use of BUE and attention to task.   Gait training with RW  2X 529fwith min assist from PT. Min cues for AD management in turn to sit.  Sit<>supine with supervision assist and increased time for movement planning and initiation. Supine NMR: Heel slides, SAQ, hip abduction/adduction, SLR, x 10 BLE with cues for attention to task, full ROM, and improved speed to improve strengthening aspects of movement   [facility] returned to room and [facility] reach and all needs met.         Therapy Documentation Precautions:  Precautions Precautions: Fall Precaution Comments: h/o multiple falls, CHF and orthostatic hypotension Restrictions Weight Bearing Restrictions: No General:   Vital Signs: Therapy Vitals Temp: 98.2 F (36.8 C) Pulse Rate: 66 Resp: 20 BP: 116/66 Patient  Position (if appropriate): Sitting Oxygen Therapy SpO2: 98 % O2 Device: Room Air Pain:   Mobility:   Locomotion :    Trunk/Postural Assessment :    Balance:   Exercises:   Other Treatments:      Therapy/Group: Individual Therapy  AuLorie Phenix2/14/2019, 4:10 PM

## 2018-03-14 NOTE — Progress Notes (Signed)
[facility] PHYSICAL MEDICINE & REHABILITATION PROGRESS NOTE   Subjective/Complaints: Slept well. Denies pain. Reports no problems with voiding. Nurse reports rash on buttock  ROS: Patient denies fever, rash, sore throat, blurred vision, nausea, vomiting, diarrhea, cough, shortness of breath or chest pain, joint or back pain, headache, or mood change.    Objective:   Dg Abd 1 View  Result Date: 03/13/2018 CLINICAL DATA:  Abdominal pain, ureteral calculus EXAM: ABDOMEN - 1 VIEW COMPARISON:  CT abdomen and pelvis 03/02/2018 FINDINGS: Reservoir and tubing from a laparoscopic gastric band project over upper abdomen. 2 adjacent calculi project over LEFT kidney, 8 x 5 mm and 3 x 3 mm. Atherosclerotic calcifications and small phleboliths in pelvis. No definite ureteral calcification visualized. Bowel gas pattern normal. Osseous demineralization with degenerative changes of the lumbar spine. IMPRESSION: 2 LEFT renal calculi as above. Electronically Signed   By: Lavonia Dana M.D.   On: 03/13/2018 08:32   No results for input(s): WBC, HGB, HCT, PLT in the last 72 hours. Recent Labs    03/12/18 0513  NA 135  K 4.1  CL 102  CO2 22  GLUCOSE 135*  BUN 19  CREATININE 1.33*  CALCIUM 8.5*    Intake/Output Summary (Last 24 hours) at 03/14/2018 1225 Last data filed at 03/14/2018 0820 Gross per 24 hour  Intake 960 ml  Output -  Net 960 ml     Physical Exam: Vital Signs Blood pressure (!) 152/78, pulse 66, temperature 98.3 F (36.8 C), resp. rate 19, height 5\' 3"  (1.6 m), weight 90.2 kg, SpO2 98 %.  Constitutional: No distress . Vital signs reviewed. HEENT: EOMI, oral membranes moist Neck: supple Cardiovascular: RRR without murmur. No JVD    Respiratory: CTA Bilaterally without wheezes or rales. Normal effort    GI: BS +, non-tender, non-distended  Musculoskeletal:  No edema or tenderness in extremities  Neurological: Alert with decreased insight and awareness. Delayed processing    Follows simple commands. perseverates Motor: LUE: 4-4+/5 proximal to distal--no new issues LLE: HF, KE 3+/5, ADF 4/5 RUE: 4/5 proximal to distal--no change  RLE: HF, KE 3/5, ADF 3/5 Skin: Skin is warm and dry. Vesicular rash on right buttock Psychiatric: restless     Assessment/Plan: 1. Functional deficits secondary to right MCA and left MCA infarcts which require 3+ hours per day of interdisciplinary therapy in a comprehensive inpatient rehab setting.  Physiatrist is providing close team supervision and 24 hour management of active medical problems listed below.  Physiatrist and rehab team continue to assess barriers to discharge/monitor patient progress toward functional and medical goals  Care Tool:  Bathing    Body parts bathed by patient: Right arm, Left arm, Chest, Abdomen, Face, Front perineal area, Right upper leg, Left upper leg, Right lower leg, Left lower leg   Body parts bathed by helper: Buttocks     Bathing assist Assist Level: Minimal Assistance - Patient > 75%(max cues)     Upper Body Dressing/Undressing Upper body dressing   What is the patient wearing?: Pull over shirt    Upper body assist Assist Level: Contact Guard/Touching assist    Lower Body Dressing/Undressing Lower body dressing      What is the patient wearing?: Underwear/pull up, Pants     Lower body assist Assist for lower body dressing: Minimal Assistance - Patient > 75%     Toileting Toileting    Toileting assist Assist for toileting: Moderate Assistance - Patient 50 - 74%     Transfers  Chair/bed transfer  Transfers assist     Chair/bed transfer assist level: Minimal Assistance - Patient > 75%     Locomotion Ambulation   Ambulation assist      Assist level: Minimal Assistance - Patient > 75% Assistive device: Walker-rolling Max distance: 20   Walk 10 feet activity   Assist     Assist level: Minimal Assistance - Patient > 75% Assistive device:  Walker-rolling   Walk 50 feet activity   Assist Walk 50 feet with 2 turns activity did not occur: Safety/medical concerns         Walk 150 feet activity   Assist Walk 150 feet activity did not occur: Safety/medical concerns         Walk 10 feet on uneven surface  activity   Assist Walk 10 feet on uneven surfaces activity did not occur: Safety/medical concerns         Wheelchair     Assist Will patient use wheelchair at discharge?: No Type of Wheelchair: Manual    Wheelchair assist level: Minimal Assistance - Patient > 75% Max wheelchair distance: 30    Wheelchair 50 feet with 2 turns activity    Assist        Assist Level: Dependent - Patient 0%   Wheelchair 150 feet activity     Assist     Assist Level: Dependent - Patient 0%    Medical Problem List and Plan: 1.  Left side weakness secondary to acute right MCA, MCA/PCA and left punctate MCA infarctions  -Continue CIR therapies including PT, OT   -loop recorder in place 2.  DVT Prophylaxis/Anticoagulation: SCDs. Monitor for any signs of DVT 3. Pain Management:  Tylenol as needed 4. Mood:  Celexa 10 mg daily. Provide emotional support 5. Neuropsych: This patient is capable of making decisions on her own behalf. 6. Skin/Wound Care:  Routine skin checks 7. Fluids/Electrolytes/Nutrition:  encourage PO  Fair intake thus far  8.  Left ureteral stone with hydronephrosis. Status post stenting.  Follow-up urology services [facility]  -monitor urine output/Cr 9. CAD/CABG. Continue aspirin and Plavix 10. Hypertension. Lisinopril 5 mg daily, Coreg 12.5 mg twice a day, Norvasc 10 mg daily.    -fair control 12/14 11. Diabetes mellitus.Hemoglobin A1c 6.0.SSI.  patient on Glucotrol 10 mg twice a day and Glucophage 1000 mg twice a day prior to admission.     -checking cbgs ac and hs  -resumed glucotrol 5mg  bid, borderline control--increase to 10mg  bid 12. AKI. Push po 13. Fever with  leukocytosis. Blood cultures no growth to date. Complete course of OMNICEF. 14. Hyperlipidemia. Lipitor 15.  Hypothyroidism. 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

## 2018-03-14 NOTE — Progress Notes (Signed)
Speech Language Pathology Daily Session Note  Patient Details  Name: LIAM BOSSMAN MRN: [id number] Date of Birth: 12-11-1945  Today's Date: 03/14/2018 SLP Individual Time: 9030-0923 SLP Individual Time Calculation (min): 55 min  Short Term Goals: Week 1: SLP Short Term Goal 1 (Week 1): Patient will consume current diet with minimal overt s/s of aspiration with Mod I for use of swallowing strategies.  SLP Short Term Goal 2 (Week 1): Patient will demonstrate sustained attention to functional tasks for 10 minutes with Mod A verbal cues for redirection.  SLP Short Term Goal 3 (Week 1): Patient will initiate functional tasks with Min A verbal cues. SLP Short Term Goal 4 (Week 1): Patient will demonstrate functional problem solving for basic and familiar tasks with Mod A verbal cues.   Skilled Therapeutic Interventions: Skilled treatment session focused on dysphagia and cognitive goals. Upon arrival, patient independently requested a snack. Patient consumed a snack of regular textures (peanut butter crackers) and thin liquids via straw without overt s/s of aspiration. While consuming her snack, patient required Mod A verbal cues to alternate attention between self-feeding and conversation for ~15 minutes. SLP also facilitated session by resuming medication management ask from previous session. Patient independently recalled the functions of her medications and organized a BID pill box with Mod I. Patient left upright in wheelchair with alarm on and all needs within reach. Continue with current plan of care.      Pain No/Denies Pain   Therapy/Group: Individual Therapy  Davionne Dowty 03/14/2018, 3:07 PM

## 2018-03-15 LAB — GLUCOSE, CAPILLARY
Glucose-Capillary: 138 mg/dL — ABNORMAL HIGH (ref 70–99)
Glucose-Capillary: 124 mg/dL — ABNORMAL HIGH (ref 70–99)

## 2018-03-15 NOTE — Consult Note (Signed)
Urology Consult   Physician requesting consult: Naaman Plummer  Reason for consult: Kidney stone  History of Present Illness: Theresa Ray is a 72 y.o. female admitted recently for CVA.  Prior to her admission, she underwent emergent stenting of an obstructing, painful left upper ureteral stone in [place], [place] by Dr. Emi Holes.  This temporarily took care of her pain.  Several days ago, during a bowel movement the stent came out.  She has not had significant pain since that time.  KUB taken recently revealed that the left upper ureteral stone present on 2 December is now in her kidney.    Past Medical History:  Diagnosis Date  . CAD in native artery    a. s/p CABGx3 in 01/2017.  Marland Kitchen Chronic diastolic CHF (congestive heart failure) ([place])   . Diabetes mellitus ([place])   . Diabetic neuropathy ([place])   . History of kidney stones    history of stones  . Hypertension   . Hypothyroidism   . NSTEMI (non-ST elevated myocardial infarction) ([place])   . Orthostatic hypotension   . Postoperative atrial fibrillation ([place])    a. after CABG 01/2017.  Marland Kitchen TIA (transient ischemic attack)     Past Surgical History:  Procedure Laterality Date  . ABDOMINAL HYSTERECTOMY    . CORONARY ARTERY BYPASS GRAFT N/A 02/24/2017   Procedure: CORONARY ARTERY BYPASS GRAFTING (CABG) USING LEFT INTERNAL MAMMARY ARTERY TO LAD AND ENDOSCOPICALLY HARVESTED GREATER SAPHENOUS VEIN TO PDA AND TO OM1.;  Surgeon: Grace Isaac, MD;  Location: [place];  Service: Open Heart Surgery;  Laterality: N/A;  . ENDOVEIN HARVEST OF GREATER SAPHENOUS VEIN Right 02/24/2017   Procedure: ENDOVEIN HARVEST OF GREATER SAPHENOUS VEIN;  Surgeon: Grace Isaac, MD;  Location: [place];  Service: Open Heart Surgery;  Laterality: Right;  . LAPAROSCOPIC GASTRIC BANDING    . LEFT HEART CATH AND CORONARY ANGIOGRAPHY N/A 02/18/2017   Procedure: LEFT HEART CATH AND CORONARY ANGIOGRAPHY;  Surgeon: Leonie Man, MD;  Location: [place] CV LAB;   Service: Cardiovascular;  Laterality: N/A;  . LOOP RECORDER INSERTION N/A 03/06/2018   Procedure: LOOP RECORDER INSERTION;  Surgeon: Constance Haw, MD;  Location: [place] CV LAB;  Service: Cardiovascular;  Laterality: N/A;  . TEE WITHOUT CARDIOVERSION N/A 02/24/2017   Procedure: TRANSESOPHAGEAL ECHOCARDIOGRAM (TEE);  Surgeon: Grace Isaac, MD;  Location: [place];  Service: Open Heart Surgery;  Laterality: N/A;  . TEE WITHOUT CARDIOVERSION N/A 03/06/2018   Procedure: TRANSESOPHAGEAL ECHOCARDIOGRAM (TEE);  Surgeon: Dorothy Spark, MD;  Location: [facility] ENDOSCOPY;  Service: Cardiovascular;  Laterality: N/A;  loop     Current Hospital Medications: Scheduled Meds: . amLODipine  10 mg Oral Daily  . aspirin EC  81 mg Oral Daily  . atorvastatin  40 mg Oral Daily  . carvedilol  12.5 mg Oral BID WC  . citalopram  10 mg Oral Daily  . clopidogrel  75 mg Oral Daily  . glipiZIDE  5 mg Oral BID AC  . insulin aspart  0-9 Units Subcutaneous TID WC  . lactose free nutrition  237 mL Oral TID WC  . levothyroxine  175 mcg Oral QAC breakfast  . lisinopril  5 mg Oral Daily  . multivitamin with minerals  1 tablet Oral Daily  . pantoprazole  40 mg Oral Daily  . potassium chloride  40 mEq Oral Daily  . valACYclovir  1,000 mg Oral Daily   Continuous Infusions: PRN Meds:.acetaminophen, sorbitol  Allergies:  Allergies  Allergen  Reactions  . Sulfa Antibiotics Nausea And Vomiting  . Shellfish Allergy Other (See Comments)    UNKNOWN REACTION.  Allergy found on ALLERGY TEST.  . Aggrenox [Aspirin-Dipyridamole Er] Other (See Comments)    Redness in eyes, takes aspirin 325 at home at baseline    Family History  Problem Relation Age of Onset  . Heart disease Mother 22       CABG  . CVA Father     Social History:  reports that she quit smoking about 44 years ago. Her smoking use included cigarettes. She started smoking about 52 years ago. She has never used smokeless tobacco. She reports that  she does not drink alcohol or use drugs.  ROS: Patient not present for review of systems  Physical Exam:  Vital signs in last 24 hours: Temp:  [98.2 F (36.8 C)-99.1 F (37.3 C)] 98.6 F (37 C) (12/15 0412) Pulse Rate:  [63-69] 69 (12/15 0912) Resp:  [17-20] 17 (12/15 0412) BP: (116-146)/(66-90) 121/82 (12/15 0912) SpO2:  [95 %-98 %] 95 % (12/15 0412) Weight:  [91 kg] 91 kg (12/15 0412) Patient was not present for examination at the time of consultation--she was in rehabilitation. Interview was held with the patient's husband. Laboratory Data:  No results for input(s): WBC, HGB, HCT, PLT in the last 72 hours.  No results for input(s): NA, K, CL, GLUCOSE, BUN, CALCIUM, CREATININE in the last 72 hours.  Invalid input(s): CO3   Results for orders placed or performed during the hospital encounter of 03/09/18 (from the past 24 hour(s))  Glucose, capillary     Status: Abnormal   Collection Time: 03/14/18 11:51 AM  Result Value Ref Range   Glucose-Capillary 191 (H) 70 - 99 mg/dL  Glucose, capillary     Status: Abnormal   Collection Time: 03/14/18  4:25 PM  Result Value Ref Range   Glucose-Capillary 178 (H) 70 - 99 mg/dL  Glucose, capillary     Status: Abnormal   Collection Time: 03/14/18  9:43 PM  Result Value Ref Range   Glucose-Capillary 122 (H) 70 - 99 mg/dL  Glucose, capillary     Status: Abnormal   Collection Time: 03/15/18  6:33 AM  Result Value Ref Range   Glucose-Capillary 138 (H) 70 - 99 mg/dL   Recent Results (from the past 240 hour(s))  Culture, Urine     Status: None   Collection Time: 03/05/18 11:55 AM  Result Value Ref Range Status   Specimen Description URINE, CATHETERIZED  Final   Special Requests NONE  Final   Culture   Final    NO GROWTH Performed at [facility], [address], [place], [place] [address]    Report Status 03/06/2018 FINAL  Final    Renal Function: Recent Labs    03/09/18 0532 03/10/18 6195 03/11/18 0707 03/12/18 0513   CREATININE 1.28* 1.30* 1.31* 1.33*   Estimated Creatinine Clearance: 42.7 mL/min (A) (by C-G formula based on SCr of 1.33 mg/dL (H)).  Radiologic Imaging: KUB revealed presence of 2 stones in her interpolar left renal area.  Stent has been removed.  I independently reviewed the above imaging studies.  Impression/Assessment:  Left ureteral stone, status post stenting with stent removal (patient unfortunately had a long tether left on her stent which was placed urgently.  She probably just mistakenly pulled on this.  She is now doing well without pain, stone has been pushed up in the kidney  Plan:  At this point, she does not need  any urgent management.  I would suggest that she follow-up with Dr. Emi Holes in [place] following her discharge.  If she has recurrent pain during this hospitalization, please reconsult.

## 2018-03-15 NOTE — Progress Notes (Signed)
[place] PHYSICAL MEDICINE & REHABILITATION PROGRESS NOTE   Subjective/Complaints: No new issues. Denies pain. Reported today that she's had the buttock rash before  ROS: Patient denies fever, rash, sore throat, blurred vision, nausea, vomiting, diarrhea, cough, shortness of breath or chest pain, joint or back pain, headache, or mood change.    Objective:   No results found. No results for input(s): WBC, HGB, HCT, PLT in the last 72 hours. No results for input(s): NA, K, CL, CO2, GLUCOSE, BUN, CREATININE, CALCIUM in the last 72 hours.  Intake/Output Summary (Last 24 hours) at 03/15/2018 1041 Last data filed at 03/15/2018 0844 Gross per 24 hour  Intake 720 ml  Output -  Net 720 ml     Physical Exam: Vital Signs Blood pressure 121/82, pulse 69, temperature 98.6 F (37 C), temperature source Oral, resp. rate 17, height 5\' 3"  (1.6 m), weight 91 kg, SpO2 95 %.  Constitutional: No distress . Vital signs reviewed. HEENT: EOMI, oral membranes moist Neck: supple Cardiovascular: RRR without murmur. No JVD    Respiratory: CTA Bilaterally without wheezes or rales. Normal effort    GI: BS +, non-tender, non-distended  Musculoskeletal:  No edema or tenderness in extremities  Neurological: Alert with decreased insight and awareness. Delayed processing   Follows simple commands. perseverates Motor: LUE: 4-4+/5 proximal to distal--stable exam LLE: HF, KE 3+/5, ADF 4/5 RUE: 4/5 proximal to distal--no change  RLE: HF, KE 3/5, ADF 3/5 Skin: Skin is warm and dry. Vesicular rash on right buttock Psychiatric: restless     Assessment/Plan: 1. Functional deficits secondary to right MCA and left MCA infarcts which require 3+ hours per day of interdisciplinary therapy in a comprehensive inpatient rehab setting.  Physiatrist is providing close team supervision and 24 hour management of active medical problems listed below.  Physiatrist and rehab team continue to assess barriers to  discharge/monitor patient progress toward functional and medical goals  Care Tool:  Bathing    Body parts bathed by patient: Right arm, Left arm, Chest, Abdomen, Face, Front perineal area, Right upper leg, Left upper leg, Right lower leg, Left lower leg   Body parts bathed by helper: Buttocks     Bathing assist Assist Level: Minimal Assistance - Patient > 75%(max cues)     Upper Body Dressing/Undressing Upper body dressing   What is the patient wearing?: Pull over shirt    Upper body assist Assist Level: Contact Guard/Touching assist    Lower Body Dressing/Undressing Lower body dressing      What is the patient wearing?: Underwear/pull up     Lower body assist Assist for lower body dressing: Minimal Assistance - Patient > 75%     Toileting Toileting    Toileting assist Assist for toileting: Moderate Assistance - Patient 50 - 74%     Transfers Chair/bed transfer  Transfers assist     Chair/bed transfer assist level: Minimal Assistance - Patient > 75%     Locomotion Ambulation   Ambulation assist      Assist level: Minimal Assistance - Patient > 75% Assistive device: Walker-rolling Max distance: 20   Walk 10 feet activity   Assist     Assist level: Minimal Assistance - Patient > 75% Assistive device: Walker-rolling   Walk 50 feet activity   Assist Walk 50 feet with 2 turns activity did not occur: Safety/medical concerns         Walk 150 feet activity   Assist Walk 150 feet activity did not occur: Safety/medical concerns  Walk 10 feet on uneven surface  activity   Assist Walk 10 feet on uneven surfaces activity did not occur: Safety/medical concerns         Wheelchair     Assist Will patient use wheelchair at discharge?: No Type of Wheelchair: Manual    Wheelchair assist level: Minimal Assistance - Patient > 75% Max wheelchair distance: 30    Wheelchair 50 feet with 2 turns activity    Assist         Assist Level: Dependent - Patient 0%   Wheelchair 150 feet activity     Assist     Assist Level: Dependent - Patient 0%    Medical Problem List and Plan: 1.  Left side weakness secondary to acute right MCA, MCA/PCA and left punctate MCA infarctions  -Continue CIR therapies including PT, OT   -loop recorder in place 2.  DVT Prophylaxis/Anticoagulation: SCDs. Monitor for any signs of DVT 3. Pain Management:  Tylenol as needed 4. Mood:  Celexa 10 mg daily. Provide emotional support 5. Neuropsych: This patient is capable of making decisions on her own behalf. 6. Skin/Wound Care:  Routine skin checks 7. Fluids/Electrolytes/Nutrition:  encourage PO  Fair intake thus far  8.  Left ureteral stone with hydronephrosis. Status post stenting.  Follow-up urology services [facility]  -monitor urine output/Cr 9. CAD/CABG. Continue aspirin and Plavix 10. Hypertension. Lisinopril 5 mg daily, Coreg 12.5 mg twice a day, Norvasc 10 mg daily.    -fair control 12/15 11. Diabetes mellitus.Hemoglobin A1c 6.0.SSI.  patient on Glucotrol 10 mg twice a day and Glucophage 1000 mg twice a day prior to admission.     -checking cbgs ac and hs  -resumed glucotrol 5mg  bid,  -increased to 10mg  bid on 12/14---observe for response 12. AKI. Push po 13. Fever with leukocytosis. Blood cultures no growth to date. Complete course of OMNICEF. 14. Hyperlipidemia. Lipitor 15.  Hypothyroidism. 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

## 2018-03-16 ENCOUNTER — Inpatient Hospital Stay (HOSPITAL_COMMUNITY): Payer: Medicare HMO

## 2018-03-16 ENCOUNTER — Inpatient Hospital Stay (HOSPITAL_COMMUNITY): Payer: Medicare HMO | Admitting: Speech Pathology

## 2018-03-16 ENCOUNTER — Inpatient Hospital Stay (HOSPITAL_COMMUNITY): Payer: Medicare HMO | Admitting: Occupational Therapy

## 2018-03-16 LAB — GLUCOSE, CAPILLARY
Glucose-Capillary: 144 mg/dL — ABNORMAL HIGH (ref 70–99)
Glucose-Capillary: 159 mg/dL — ABNORMAL HIGH (ref 70–99)
Glucose-Capillary: 135 mg/dL — ABNORMAL HIGH (ref 70–99)
Glucose-Capillary: 170 mg/dL — ABNORMAL HIGH (ref 70–99)
Glucose-Capillary: 87 mg/dL (ref 70–99)
Glucose-Capillary: 111 mg/dL — ABNORMAL HIGH (ref 70–99)

## 2018-03-16 NOTE — Plan of Care (Signed)
Pt alert and oriented. Denies any pain or discomfort. Husband at bedside. No acute distress noted at this time

## 2018-03-16 NOTE — Progress Notes (Signed)
Occupational Therapy Session Note  Patient Details  Name: Theresa Ray MRN: [id number] Date of Birth: [birth year]  Today's Date: 03/16/2018 OT Individual Time: 7466-3556 OT Individual Time Calculation (min): 59 min    Short Term Goals: Week 1:  OT Short Term Goal 1 (Week 1): Patient will complete bathing and dressing tasks with min A seated with support using appropriate ADs as needed OT Short Term Goal 2 (Week 1): Patient will complete funcitonal transfers with LRAD CG, no LOB and good awareness of left side OT Short Term Goal 3 (Week 1): Patient will identify safe strategies for adl tasks and recall 90% of newly learned techniques OT Short Term Goal 4 (Week 1): Patient will tolerate standing for 10 minutes of activity with fair + standing balance, and unsupported sitting with good balance  Skilled Therapeutic Interventions/Progress Updates:    Pt greeted seated in wc eating breakfast. Finished self-feeding with OT working on visual scanning on plate. Dressing completed with min A to thread LLE into pants, sit<>stand from wc at the sink with increased time for processing and Min A for sit<>stand and CGA  for balance. Pt needed set-up A to orient shirt, then was able to sequence and pull shirt on correctly. Pt reported need for bathroom and ambulated to bathroom w/ RW and CGA. Pt voided bladder and completed peri-care with supervision and min A for clothing management. Educated pt on RW placement at the sink, pt washed hands and returned to wc. Pt left with safety belt on and needs met.   Therapy Documentation Precautions:  Precautions Precautions: Fall Precaution Comments: h/o multiple falls, CHF and orthostatic hypotension Restrictions Weight Bearing Restrictions: No General:   Vital Signs: Pain: Pain Assessment Pain Scale: 0-10 Pain Score: 0-No pain   Therapy/Group: Individual Therapy  Valma Cava 03/16/2018, 7:56 AM

## 2018-03-16 NOTE — Progress Notes (Signed)
Speech Language Pathology Daily Session Note  Patient Details  Name: Theresa Ray MRN: [id number] Date of Birth: 09-06-1945  Today's Date: 03/16/2018 SLP Individual Time: 1115-1200 SLP Individual Time Calculation (min): 45 min  Short Term Goals: Week 1: SLP Short Term Goal 1 (Week 1): Patient will consume current diet with minimal overt s/s of aspiration with Mod I for use of swallowing strategies.  SLP Short Term Goal 2 (Week 1): Patient will demonstrate sustained attention to functional tasks for 10 minutes with Mod A verbal cues for redirection.  SLP Short Term Goal 3 (Week 1): Patient will initiate functional tasks with Min A verbal cues. SLP Short Term Goal 4 (Week 1): Patient will demonstrate functional problem solving for basic and familiar tasks with Mod A verbal cues.   Skilled Therapeutic Interventions: Skilled treatment session focused on cognitive goals. SLP facilitated session by providing Max A verbal cues for organization and sustained attention during a mildly complex money management task. Suspect patient's difficulty was due to fatigue and lethargy. Patient left upright in wheelchair with husband present. Continue with current plan of care.      Pain Pain Assessment Pain Scale: 0-10 Pain Score: 0-No pain  Therapy/Group: Individual Therapy  Hulet Ehrmann 03/16/2018, 3:37 PM

## 2018-03-16 NOTE — Progress Notes (Signed)
Speech Language Pathology Daily Session Note  Patient Details  Name: SOPHIE TAMEZ MRN: [id number] Date of Birth: 1945-04-23  Today's Date: 03/16/2018 SLP Individual Time: 1030-1100 SLP Individual Time Calculation (min): 30 min  Short Term Goals: Week 1: SLP Short Term Goal 1 (Week 1): Patient will consume current diet with minimal overt s/s of aspiration with Mod I for use of swallowing strategies.  SLP Short Term Goal 2 (Week 1): Patient will demonstrate sustained attention to functional tasks for 10 minutes with Mod A verbal cues for redirection.  SLP Short Term Goal 3 (Week 1): Patient will initiate functional tasks with Min A verbal cues. SLP Short Term Goal 4 (Week 1): Patient will demonstrate functional problem solving for basic and familiar tasks with Mod A verbal cues.   Skilled Therapeutic Interventions:  Skilled treatment session focused on cognition goals. SLP facilitated session by providing Min A cues to initiate, maintain and complete simple problem solving tasks. Pt able to sustain attention for entire task to completion (task included placing specified pills in each slot). Pt required Max A to recall which color went into which slot (I.e., red pill in bedtime) - only given 1 pill at a time. Pt was left upright in wheelchair, lap alarm on and husband present. Continue per current plan of care.      Pain Pain Assessment Pain Scale: 0-10 Pain Score: 0-No pain Pain Type: Acute pain Pain Location: Neck  Therapy/Group: Individual Therapy  Newton Frutiger 03/16/2018, 12:09 PM

## 2018-03-16 NOTE — Progress Notes (Addendum)
Physical Therapy Session Note  Patient Details  Name: Theresa Ray MRN: [id number] Date of Birth: [birth year]  Today's Date: 03/16/2018 PT Individual Time: 0935-1030 and 1420- 1450 PT Individual Time Calculation (min): 55 min and 30 min  Short Term Goals: Week 1:  PT Short Term Goal 1 (Week 1): Pt will perform bed mobility with min assist for supine <> sit PT Short Term Goal 2 (Week 1): Pt will be able to demonstrate functional dynamic standing balance with min assist PT Short Term Goal 3 (Week 1): Pt will be able to perform functional transfers with min assist PT Short Term Goal 4 (Week 1): Pt will be able to gait x 50' with min assist and LRAD PT Short Term Goal 5 (Week 1): Pt will be able to perform 8 stairs with rails with min assist  Skilled Therapeutic Interventions/Progress Updates:   tx 1:  Pt seated in w/c.  Pt used pt's schedule to orient her and discuss her therapies of the day.  Pt was able to infer which therapy was next using clock and schedule.  neuromuscular re-education seated in wc, using [employer] , via forced use, multimodal cues for alternating reciprocal movement x bil LEs x 20 cycles x 2 targeting quadriceps muscles, 1 cycle x 1 targeting gluteal muscles, at level 40 cm/sec.  Dual task of counting aloud during movement, which slowed her down.  Endurance activity, standing x 3 minutes, x 1 minute, x 1 minute; also sit>< stand x 6 to compose puzzle with large pieces with max assist and extra time. Pt c/o back and neck pain when standing.  Gait training without AD, pt's bil hands on PT forearms, x 20' with min assist.  Cues to stand tall and look forward.  Pt left resting in w/c with needs at hand, seat belt alarm set and husband present.  tx 2:  Pt asleep in bed, but easily awakened.  Supine neuromuscular re-education via forced use, multimodal cues for 12 x 1 each R/L straight leg raises, bil lower trunk rotation, bil bridging with adductor squeezes.  Pt unable  to keep track of reps as she performed them.  Focus on eccentric control. Seated 12 x 1 bil heel raises.   Rolling R and sitting up with mod cues, supervision to come to sitting.  Stand pivot transfer, min assist,  without use of AD to sit in w/c.  Pt left resting in w/c with needs at hand and seat belt alarm set.     Therapy Documentation Precautions:  Precautions Precautions: Fall Precaution Comments: h/o multiple falls, CHF and orthostatic hypotension Restrictions Weight Bearing Restrictions: No   Pain: AM-denied initially, but developed neck and back pain in standing.  Pt asked for pain meds upon returning to room.  PT educated pt and husband about avoiding use of multiple pillows under head.  She used 1 pillow at home. PM- pt denies; neck pain was relieved by meds and lying down after lunch         Therapy/Group: Individual Therapy  Sherion Dooly 03/16/2018, 10:41 AM

## 2018-03-16 NOTE — Progress Notes (Signed)
[place] PHYSICAL MEDICINE & REHABILITATION PROGRESS NOTE   Subjective/Complaints: No new complaints today. Finishing breakfast.   ROS: Patient denies fever, rash, sore throat, blurred vision, nausea, vomiting, diarrhea, cough, shortness of breath or chest pain, joint or back pain, headache, or mood change.    Objective:   No results found. No results for input(s): WBC, HGB, HCT, PLT in the last 72 hours. No results for input(s): NA, K, CL, CO2, GLUCOSE, BUN, CREATININE, CALCIUM in the last 72 hours.  Intake/Output Summary (Last 24 hours) at 03/16/2018 1505 Last data filed at 03/16/2018 1230 Gross per 24 hour  Intake 580 ml  Output -  Net 580 ml     Physical Exam: Vital Signs Blood pressure (!) 112/59, pulse (!) 55, temperature 98 F (36.7 C), resp. rate 20, height 5\' 3"  (1.6 m), weight 87.5 kg, SpO2 98 %.  Constitutional: No distress . Vital signs reviewed. HEENT: EOMI, oral membranes moist Neck: supple Cardiovascular: RRR without murmur. No JVD    Respiratory: CTA Bilaterally without wheezes or rales. Normal effort    GI: BS +, non-tender, non-distended  Musculoskeletal:  No edema or tenderness in extremities  Neurological: Alert with decreased insight and awareness. Delayed processing   Follows simple commands. perseverates Motor: LUE: 4-4+/5 proximal to distal--stable exam LLE: HF, KE 3+/5, ADF 4/5 RUE: 4/5 proximal to distal--stable  RLE: HF, KE 3/5, ADF 3/5 Skin: Skin is warm and dry. Vesicular rash on right buttock stable Psychiatric: restless     Assessment/Plan: 1. Functional deficits secondary to right MCA and left MCA infarcts which require 3+ hours per day of interdisciplinary therapy in a comprehensive inpatient rehab setting.  Physiatrist is providing close team supervision and 24 hour management of active medical problems listed below.  Physiatrist and rehab team continue to assess barriers to discharge/monitor patient progress toward functional  and medical goals  Care Tool:  Bathing    Body parts bathed by patient: Right arm, Left arm, Chest, Abdomen, Face, Front perineal area, Right upper leg, Left upper leg, Right lower leg, Left lower leg   Body parts bathed by helper: Buttocks     Bathing assist Assist Level: Minimal Assistance - Patient > 75%(max cues)     Upper Body Dressing/Undressing Upper body dressing   What is the patient wearing?: Pull over shirt    Upper body assist Assist Level: Contact Guard/Touching assist    Lower Body Dressing/Undressing Lower body dressing      What is the patient wearing?: Underwear/pull up     Lower body assist Assist for lower body dressing: Minimal Assistance - Patient > 75%     Toileting Toileting    Toileting assist Assist for toileting: Moderate Assistance - Patient 50 - 74%     Transfers Chair/bed transfer  Transfers assist     Chair/bed transfer assist level: Contact Guard/Touching assist     Locomotion Ambulation   Ambulation assist      Assist level: Minimal Assistance - Patient > 75% Assistive device: Hand held assist Max distance: 20   Walk 10 feet activity   Assist     Assist level: Minimal Assistance - Patient > 75% Assistive device: Hand held assist   Walk 50 feet activity   Assist Walk 50 feet with 2 turns activity did not occur: Safety/medical concerns         Walk 150 feet activity   Assist Walk 150 feet activity did not occur: Safety/medical concerns  Walk 10 feet on uneven surface  activity   Assist Walk 10 feet on uneven surfaces activity did not occur: Safety/medical concerns         Wheelchair     Assist Will patient use wheelchair at discharge?: No Type of Wheelchair: Manual    Wheelchair assist level: Minimal Assistance - Patient > 75% Max wheelchair distance: 30    Wheelchair 50 feet with 2 turns activity    Assist        Assist Level: Dependent - Patient 0%   Wheelchair  150 feet activity     Assist     Assist Level: Dependent - Patient 0%    Medical Problem List and Plan: 1.  Left side weakness secondary to acute right MCA, MCA/PCA and left punctate MCA infarctions  -Continue CIR therapies including PT, OT   -loop recorder in place 2.  DVT Prophylaxis/Anticoagulation: SCDs. Monitor for any signs of DVT 3. Pain Management:  Tylenol as needed 4. Mood:  Celexa 10 mg daily. Provide emotional support 5. Neuropsych: This patient is capable of making decisions on her own behalf. 6. Skin/Wound Care:  Routine skin checks 7. Fluids/Electrolytes/Nutrition:  encourage PO  Fair intake thus far  8.  Left ureteral stone with hydronephrosis. Status post stenting.  Follow-up urology services [facility]  -monitor urine output/Cr  -recheck BMET tomorrow 9. CAD/CABG. Continue aspirin and Plavix 10. Hypertension. Lisinopril 5 mg daily, Coreg 12.5 mg twice a day, Norvasc 10 mg daily.    -fair control 12/16 11. Diabetes mellitus.Hemoglobin A1c 6.0.SSI.  patient on Glucotrol 10 mg twice a day and Glucophage 1000 mg twice a day prior to admission.     -checking cbgs ac and hs  -resumed glucotrol, increased to 10mg  bid on 12/14---improving control 12. AKI. Push po 13. Fever with leukocytosis. Blood cultures no growth to date. Completed course of OMNICEF. 14. Hyperlipidemia. Lipitor 15.  Hypothyroidism. 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

## 2018-03-16 NOTE — Anesthesia Postprocedure Evaluation (Signed)
Anesthesia Post Note  Patient: Theresa Ray  Procedure(s) Performed: TRANSESOPHAGEAL ECHOCARDIOGRAM (TEE) (N/A ) BUBBLE STUDY     Patient location during evaluation: Endoscopy Anesthesia Type: MAC Level of consciousness: awake Pain management: pain level controlled Vital Signs Assessment: post-procedure vital signs reviewed and stable Respiratory status: spontaneous breathing Cardiovascular status: stable Postop Assessment: no apparent nausea or vomiting Anesthetic complications: no    Last Vitals:  Vitals:   03/09/18 1152 03/09/18 1554  BP: 140/62 139/68  Pulse: 62   Resp: 16 19  Temp: 37.2 C 37.2 C  SpO2: 94% 99%    Last Pain:  Vitals:   03/09/18 1554  TempSrc: Axillary  PainSc:    Pain Goal: Patients Stated Pain Goal: 0 (03/08/18 0015)               Huston Foley

## 2018-03-17 ENCOUNTER — Inpatient Hospital Stay (HOSPITAL_COMMUNITY): Payer: Medicare HMO | Admitting: Occupational Therapy

## 2018-03-17 ENCOUNTER — Inpatient Hospital Stay (HOSPITAL_COMMUNITY): Payer: Medicare HMO | Admitting: Speech Pathology

## 2018-03-17 ENCOUNTER — Inpatient Hospital Stay (HOSPITAL_COMMUNITY): Payer: Medicare HMO | Admitting: Physical Therapy

## 2018-03-17 LAB — GLUCOSE, CAPILLARY
Glucose-Capillary: 132 mg/dL — ABNORMAL HIGH (ref 70–99)
Glucose-Capillary: 178 mg/dL — ABNORMAL HIGH (ref 70–99)
Glucose-Capillary: 150 mg/dL — ABNORMAL HIGH (ref 70–99)
Glucose-Capillary: 224 mg/dL — ABNORMAL HIGH (ref 70–99)

## 2018-03-17 LAB — BASIC METABOLIC PANEL
ANION GAP: 13 (ref 5–15)
BUN: 30 mg/dL — ABNORMAL HIGH (ref 8–23)
CALCIUM: 9 mg/dL (ref 8.9–10.3)
CO2: 22 mmol/L (ref 22–32)
Chloride: 97 mmol/L — ABNORMAL LOW (ref 98–111)
Creatinine, Ser: 1.53 mg/dL — ABNORMAL HIGH (ref 0.44–1.00)
GFR calc Af Amer: 40 mL/min — ABNORMAL LOW (ref >60)
GFR calc non Af Amer: 34 mL/min — ABNORMAL LOW (ref >60)
Glucose, Bld: 216 mg/dL — ABNORMAL HIGH (ref 70–99)
Potassium: 4.8 mmol/L (ref 3.5–5.1)
Sodium: 132 mmol/L — ABNORMAL LOW (ref 135–145)

## 2018-03-17 LAB — CBC WITH DIFFERENTIAL/PLATELET
Abs Immature Granulocytes: 0.05 10*3/uL (ref 0.00–0.07)
Basophils Absolute: 0.1 10*3/uL (ref 0.0–0.1)
Basophils Relative: 1 %
Eosinophils Absolute: 0.2 10*3/uL (ref 0.0–0.5)
Eosinophils Relative: 2 %
HCT: 32.2 % — ABNORMAL LOW (ref 36.0–46.0)
Hemoglobin: 10.5 g/dL — ABNORMAL LOW (ref 12.0–15.0)
IMMATURE GRANULOCYTES: 1 %
LYMPHS ABS: 2.6 10*3/uL (ref 0.7–4.0)
Lymphocytes Relative: 28 %
MCH: 30 pg (ref 26.0–34.0)
MCHC: 32.6 g/dL (ref 30.0–36.0)
MCV: 92 fL (ref 80.0–100.0)
MONOS PCT: 10 %
Monocytes Absolute: 0.9 10*3/uL (ref 0.1–1.0)
Neutro Abs: 5.5 10*3/uL (ref 1.7–7.7)
Neutrophils Relative %: 58 %
Platelets: 603 10*3/uL — ABNORMAL HIGH (ref 150–400)
RBC: 3.5 MIL/uL — ABNORMAL LOW (ref 3.87–5.11)
RDW: 12.1 % (ref 11.5–15.5)
WBC: 9.3 10*3/uL (ref 4.0–10.5)
nRBC: 0 % (ref 0.0–0.2)

## 2018-03-17 LAB — URINALYSIS, ROUTINE W REFLEX MICROSCOPIC
Bilirubin Urine: NEGATIVE
GLUCOSE, UA: NEGATIVE mg/dL
Hgb urine dipstick: NEGATIVE
KETONES UR: NEGATIVE mg/dL
Nitrite: NEGATIVE
PH: 5 (ref 5.0–8.0)
Protein, ur: NEGATIVE mg/dL
Specific Gravity, Urine: 1.014 (ref 1.005–1.030)

## 2018-03-17 NOTE — Progress Notes (Signed)
Urine for u/a and culture sent to lab per orders

## 2018-03-17 NOTE — Progress Notes (Addendum)
Occupational Therapy Weekly Progress Note  Patient Details  Name: Theresa Ray MRN: [id number] Date of Birth: Aug 15, 1945  Beginning of progress report period: March 10, 2018 End of progress report period: March 17, 2018  Today's Date: 03/17/2018  Session 1 OT Individual Time: 6063-0160 OT Individual Time Calculation (min): 60 min   Session 2 OT Individual Time: 1400-1430 OT Individual Time Calculation (min): 30 min    Patient has met 4 of 4 short term goals.  Pt has made steady progress towards OT goals this week. She has demonstrated improved sit<>stand, but continues to need CGA for any dynamic task within BADLs 2/2 occasional LOB. Pt continues to need increased time to process commands and problem solve within BADL tasks. Continue current plan.  Patient continues to demonstrate the following deficits: muscle weakness, decreased attention, decreased awareness, decreased problem solving, decreased safety awareness, decreased memory and delayed processing and decreased standing balance and decreased balance strategies and therefore will continue to benefit from skilled OT intervention to enhance overall performance with BADL.  Patient progressing toward long term goals..  Continue plan of care.  OT Short Term Goals Week 1:  OT Short Term Goal 1 (Week 1): Patient will complete bathing and dressing tasks with min A seated with support using appropriate ADs as needed OT Short Term Goal 1 - Progress (Week 1): Met OT Short Term Goal 2 (Week 1): Patient will complete funcitonal transfers with LRAD CG, no LOB and good awareness of left side OT Short Term Goal 2 - Progress (Week 1): Met OT Short Term Goal 3 (Week 1): Patient will identify safe strategies for adl tasks and recall 90% of newly learned techniques OT Short Term Goal 3 - Progress (Week 1): Met OT Short Term Goal 4 (Week 1): Patient will tolerate standing for 10 minutes of activity with fair + standing balance, and  unsupported sitting with good balance OT Short Term Goal 4 - Progress (Week 1): Met Week 2:  OT Short Term Goal 1 (Week 2): LTG=STG 2/2 ELOS  Skilled Therapeutic Interventions/Progress Updates:  Session 1   Pt greeted seated in wc and after finishing breakfast and agreeable to OT treatment session. Pt ambulated into bathroom with verbal cues for hand placement with sit<>stand and CGA to ambulate w/ RW. Bathing completed with min A for LB and CGA for balance when standing to wash buttocks. Pt perseverative on washing hair requiring verbal cues to move on to next body parts. Dressing completed with increased time for processing and problem solving, eventually needing verbal cues to achieve figure 4 position to thread pants and don socks. Provided pt with hair dryer and tried not to cue her on how to use it. After extended time, pt did not initiate use of hair dryer and required verbal cues to begin drying hair. Pt left seated in wc at end of session with needs met and safety belt on.   Session 2 Pt greeted hand off from SLP. Pt more confused this afternoon and required increased time and multimodal cues to make ornament. Fine motor coordination to place small pom poms in ornament. Tried to work on [employer] with wc propulsion, but pt unable to maintain attention to propel wc asking "what am I doing." Pt returned to bed at end of session with min A and increased time to initiate. Pt left semi-reclined in bed with needs met.   Therapy Documentation Precautions:  Precautions Precautions: Fall Precaution Comments: h/o multiple falls, CHF and orthostatic hypotension Restrictions  Weight Bearing Restrictions: No Pain: None/denies pain  Therapy/Group: Individual Therapy  Valma Cava 03/17/2018, 8:20 AM

## 2018-03-17 NOTE — Plan of Care (Signed)
  Problem: RH BLADDER ELIMINATION Goal: RH STG MANAGE BLADDER WITH ASSISTANCE Description STG Manage Bladder With supervision Assistance  Outcome: Progressing Goal: RH STG MANAGE BLADDER WITH MEDICATION WITH ASSISTANCE Description STG Manage Bladder With Medication With min Assistance.  Outcome: Progressing Goal: RH STG MANAGE BLADDER WITH EQUIPMENT WITH ASSISTANCE Description STG Manage Bladder With Equipment With Assistance mod  Outcome: Progressing   Problem: RH SKIN INTEGRITY Goal: RH STG SKIN FREE OF INFECTION/BREAKDOWN Description Skin free of breakdown/infection with min assist  Outcome: Progressing Goal: RH STG MAINTAIN SKIN INTEGRITY WITH ASSISTANCE Description STG Maintain Skin Integrity With min Assistance.  Outcome: Progressing   Problem: RH SAFETY Goal: RH STG ADHERE TO SAFETY PRECAUTIONS W/ASSISTANCE/DEVICE Description STG Adhere to Safety Precautions With  Assistance/Device. min  Outcome: Progressing Goal: RH STG DECREASED RISK OF FALL WITH ASSISTANCE Description STG Decreased Risk of Fall With Assistance. min  Outcome: Progressing   Problem: RH COGNITION-NURSING Goal: RH STG USES MEMORY AIDS/STRATEGIES W/ASSIST TO PROBLEM SOLVE Description STG Uses Memory Aids/Strategies With Assistance to Problem Solve. mod  Outcome: Progressing Goal: RH STG ANTICIPATES NEEDS/CALLS FOR ASSIST W/ASSIST/CUES Description STG Anticipates Needs/Calls for Assist With Assistance/Cues. mod  Outcome: Progressing   Problem: RH PAIN MANAGEMENT Goal: RH STG PAIN MANAGED AT OR BELOW PT'S PAIN GOAL Description Pain at or below 3 with min assistance  Outcome: Progressing   Problem: RH KNOWLEDGE DEFICIT Goal: RH STG INCREASE KNOWLEDGE OF DIABETES Description Patient/family able to verbalize signs and symptoms of hypo/hyperglecemia with min assistance   Outcome: Progressing Goal: RH STG INCREASE KNOWLEDGE OF HYPERTENSION Description Patient able to verbalize sign and symptoms  of hypo/hypertension with min assistance  Outcome: Progressing Goal: RH STG INCREASE KNOWLEDGE OF DYSPHAGIA/FLUID INTAKE Description Patient/family able to verbalize approved foods for  dysphagia 3 with min assistance  Outcome: Progressing Goal: RH STG INCREASE KNOWLEDGE OF STROKE PROPHYLAXIS Description Patient/family able to verbalize sign and symptom of a stroke as well as prevent strategies with min assistance  Outcome: Progressing   Problem: RH Vision Goal: RH LTG Vision (Specify) Outcome: Progressing

## 2018-03-17 NOTE — Progress Notes (Signed)
Physical Therapy Weekly Progress Note  Patient Details  Name: Theresa Ray MRN: [id number] Date of Birth: [birth year]  Beginning of progress report period: March 10, 2018 End of progress report period: March 17, 2018  Today's Date: 03/17/2018 PT Individual Time: 0900-1000 PT Individual Time Calculation (min): 60 min   Patient has met 5 of 5 short term goals and is progressing steadily with skilled PT services.   Patient continues to demonstrate the following deficits muscle weakness, decreased cardiorespiratoy endurance, decreased coordination, decreased initiation, decreased problem solving, decreased safety awareness, decreased memory and delayed processing and decreased standing balance and decreased balance strategies and therefore will continue to benefit from skilled PT intervention to increase functional independence with mobility.  Patient progressing toward long term goals..  Continue plan of care.  PT Short Term Goals Week 1:  PT Short Term Goal 1 (Week 1): Pt will perform bed mobility with min assist for supine <> sit PT Short Term Goal 1 - Progress (Week 1): Met PT Short Term Goal 2 (Week 1): Pt will be able to demonstrate functional dynamic standing balance with min assist PT Short Term Goal 2 - Progress (Week 1): Met PT Short Term Goal 3 (Week 1): Pt will be able to perform functional transfers with min assist PT Short Term Goal 3 - Progress (Week 1): Met PT Short Term Goal 4 (Week 1): Pt will be able to gait x 50' with min assist and LRAD PT Short Term Goal 4 - Progress (Week 1): Met PT Short Term Goal 5 (Week 1): Pt will be able to perform 8 stairs with rails with min assist PT Short Term Goal 5 - Progress (Week 1): Met Week 2:  PT Short Term Goal 1 (Week 2): Patient to be able to perform supine to sit with Min guard  PT Short Term Goal 2 (Week 2): Patient to be able to ascend/descend  8 steps with B railings and min guard  PT Short Term Goal 3 (Week 2): Patient  to be able to perform functional transfers with S PT Short Term Goal 4 (Week 2): Patient to be able to ambulate 124f with LRAD and S PT Short Term Goal 5 (Week 2): Patient to initiate floor to stand training   Skilled Therapeutic Interventions/Progress Updates:    [facility] and willing to participate in therapy today. Able to ambulate approximately 544fwith RW and min guard, limited by fatigue; also worked on stair training with B railings and min guard, Mod verbal cues for initiation and safety today. Able to perform functional transfers with min guard and RW, and performed exercises on mat table for hip and core strengthening. She continues to require MinA for supine to sit and was able to transfer back to the chair with min guard and RW. Attempted activity on [employer] for standing balance but patient with difficulty multitasking to follow PT cues and hit all lights at the same time. She was grossly limited by fatigue and required extended time/cues for initiation with all tasks today. She was left up in her WC with all needs met and seat belt alarm active.   Therapy Documentation Precautions:  Precautions Precautions: Fall Precaution Comments: h/o multiple falls, CHF and orthostatic hypotension Restrictions Weight Bearing Restrictions: No General:   Vital Signs: Therapy Vitals BP: 133/62 Pain: Pain Assessment Pain Scale: 0-10 Pain Score: 0-No pain   Therapy/Group: Individual Therapy  KrDeniece ReeT, DPT, CBIS  Supplemental Physical Therapist [facility]  Pager [phone] Acute Rehab Office [phone]

## 2018-03-17 NOTE — Progress Notes (Signed)
Speech Language Pathology Daily Session Note  Patient Details  Name: Theresa Ray MRN: [id number] Date of Birth: [birth year]  Today's Date: 03/17/2018 SLP Individual Time: 1015-1100 SLP Individual Time Calculation (min): 45 min  Short Term Goals: Week 1: SLP Short Term Goal 1 (Week 1): Patient will consume current diet with minimal overt s/s of aspiration with Mod I for use of swallowing strategies.  SLP Short Term Goal 2 (Week 1): Patient will demonstrate sustained attention to functional tasks for 10 minutes with Mod A verbal cues for redirection.  SLP Short Term Goal 3 (Week 1): Patient will initiate functional tasks with Min A verbal cues. SLP Short Term Goal 4 (Week 1): Patient will demonstrate functional problem solving for basic and familiar tasks with Mod A verbal cues.   Skilled Therapeutic Interventions: Skilled treatment session focused on cognitive goals. SLP facilitated session by providing Max-Total A verbal cues for problem solving during a mildly complex money management task. Patient was disorganized throughout task and required total A to self-monitor and correct errors.  Patient required more than a reasonable amount of time to initiate both verbal expression and functional tasks with intermittent language of confusion noted. RN and MD aware. Patient left upright in wheelchair with alarm on and all needs within reach. Continue with current plan of care.      Pain No/Denies Pain  Therapy/Group: Individual Therapy  Mavryk Pino 03/17/2018, 4:03 PM

## 2018-03-17 NOTE — Progress Notes (Signed)
[facility] PHYSICAL MEDICINE & REHABILITATION PROGRESS NOTE   Subjective/Complaints: No problems overnight. Denies pain. Sleeping well  ROS: Patient denies fever, rash, sore throat, blurred vision, nausea, vomiting, diarrhea, cough, shortness of breath or chest pain, joint or back pain, headache, or mood change.    Objective:   No results found. No results for input(s): WBC, HGB, HCT, PLT in the last 72 hours. No results for input(s): NA, K, CL, CO2, GLUCOSE, BUN, CREATININE, CALCIUM in the last 72 hours.  Intake/Output Summary (Last 24 hours) at 03/17/2018 1120 Last data filed at 03/17/2018 0800 Gross per 24 hour  Intake 580 ml  Output -  Net 580 ml     Physical Exam: Vital Signs Blood pressure 133/62, pulse 67, temperature 99 F (37.2 C), temperature source Oral, resp. rate 18, height 5\' 3"  (1.6 m), weight 87.7 kg, SpO2 94 %.  Constitutional: No distress . Vital signs reviewed. HEENT: EOMI, oral membranes moist Neck: supple Cardiovascular: RRR without murmur. No JVD    Respiratory: CTA Bilaterally without wheezes or rales. Normal effort    GI: BS +, non-tender, non-distended  Musculoskeletal:  No edema Neurological: Alert with decreased insight and awareness. Delayed processing   Follows simple commands. Perseverates less Motor: LUE: 4-4+/5 proximal to distal--  LLE: HF, KE 3+to 4/5, ADF 4/5 RUE: 4/5 proximal to distal--stable  RLE: HF, KE 3-3+/5, ADF 3+/5 Skin: Skin is warm and dry. Vesicular rash on right buttock stable Psychiatric: restless     Assessment/Plan: 1. Functional deficits secondary to right MCA and left MCA infarcts which require 3+ hours per day of interdisciplinary therapy in a comprehensive inpatient rehab setting.  Physiatrist is providing close team supervision and 24 hour management of active medical problems listed below.  Physiatrist and rehab team continue to assess barriers to discharge/monitor patient progress toward functional and  medical goals  Care Tool:  Bathing    Body parts bathed by patient: Right arm, Left arm, Chest, Abdomen, Face, Front perineal area, Right upper leg, Left upper leg, Right lower leg, Left lower leg   Body parts bathed by helper: Buttocks     Bathing assist Assist Level: Minimal Assistance - Patient > 75%(max cues)     Upper Body Dressing/Undressing Upper body dressing   What is the patient wearing?: Pull over shirt    Upper body assist Assist Level: Contact Guard/Touching assist    Lower Body Dressing/Undressing Lower body dressing      What is the patient wearing?: Underwear/pull up     Lower body assist Assist for lower body dressing: Minimal Assistance - Patient > 75%     Toileting Toileting    Toileting assist Assist for toileting: Moderate Assistance - Patient 50 - 74%     Transfers Chair/bed transfer  Transfers assist     Chair/bed transfer assist level: Contact Guard/Touching assist     Locomotion Ambulation   Ambulation assist      Assist level: Minimal Assistance - Patient > 75% Assistive device: Hand held assist Max distance: 20   Walk 10 feet activity   Assist     Assist level: Minimal Assistance - Patient > 75% Assistive device: Hand held assist   Walk 50 feet activity   Assist Walk 50 feet with 2 turns activity did not occur: Safety/medical concerns         Walk 150 feet activity   Assist Walk 150 feet activity did not occur: Safety/medical concerns         Walk  10 feet on uneven surface  activity   Assist Walk 10 feet on uneven surfaces activity did not occur: Safety/medical concerns         Wheelchair     Assist Will patient use wheelchair at discharge?: No Type of Wheelchair: Manual    Wheelchair assist level: Minimal Assistance - Patient > 75% Max wheelchair distance: 30    Wheelchair 50 feet with 2 turns activity    Assist        Assist Level: Dependent - Patient 0%   Wheelchair 150  feet activity     Assist     Assist Level: Dependent - Patient 0%    Medical Problem List and Plan: 1.  Left side weakness secondary to acute right MCA, MCA/PCA and left punctate MCA infarctions  -Interdisciplinary Team Conference today     -loop recorder in place 2.  DVT Prophylaxis/Anticoagulation: SCDs. Monitor for any signs of DVT 3. Pain Management:  Tylenol as needed 4. Mood:  Celexa 10 mg daily. Provide emotional support 5. Neuropsych: This patient is capable of making decisions on her own behalf. 6. Skin/Wound Care:  Routine skin checks 7. Fluids/Electrolytes/Nutrition:  encourage PO  Fair intake thus far  8.  Left ureteral stone with hydronephrosis. Status post stenting.  Follow-up urology services [facility]  -monitor urine output/Cr  -recheck BMET Wednesday 9. CAD/CABG. Continue aspirin and Plavix 10. Hypertension. Lisinopril 5 mg daily, Coreg 12.5 mg twice a day, Norvasc 10 mg daily.    -fair control 12/17 11. Diabetes mellitus.Hemoglobin A1c 6.0.SSI.  patient on Glucotrol 10 mg twice a day and Glucophage 1000 mg twice a day prior to admission.     -checking cbgs ac and hs  -resumed glucotrol, increased to 10mg  bid on 12/14---good control 12. AKI. Push po 13. Fever with leukocytosis. Blood cultures no growth to date. Completed course of OMNICEF. 14. Hyperlipidemia. Lipitor 15.  Hypothyroidism. 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

## 2018-03-17 NOTE — Progress Notes (Signed)
Notified by lab urine culture rejected need to recollected per lab

## 2018-03-17 NOTE — Progress Notes (Signed)
Patient noted to have some increased confusion throughout the afternoon. Vitals WNL. Patient alert/oriented x4 and follows commands. However, patient is demonstrating some language of confusion, more difficulty following through with tasks this evening. Patient has also been incontinent x2 today. The husband came in this afternoon and also voiced concern that patient seemed a little more confused. Reesa Chew PA notified. New lab orders placed. Will report off to oncoming RN. Continue to monitor.

## 2018-03-18 ENCOUNTER — Inpatient Hospital Stay (HOSPITAL_COMMUNITY): Payer: Medicare HMO | Admitting: Speech Pathology

## 2018-03-18 ENCOUNTER — Inpatient Hospital Stay (HOSPITAL_COMMUNITY): Payer: Medicare HMO

## 2018-03-18 ENCOUNTER — Inpatient Hospital Stay (HOSPITAL_COMMUNITY): Payer: Medicare HMO | Admitting: Physical Therapy

## 2018-03-18 LAB — GLUCOSE, CAPILLARY
Glucose-Capillary: 135 mg/dL — ABNORMAL HIGH (ref 70–99)
Glucose-Capillary: 156 mg/dL — ABNORMAL HIGH (ref 70–99)
Glucose-Capillary: 107 mg/dL — ABNORMAL HIGH (ref 70–99)
Glucose-Capillary: 212 mg/dL — ABNORMAL HIGH (ref 70–99)

## 2018-03-18 NOTE — Progress Notes (Signed)
Physical Therapy Session Note  Patient Details  Name: Theresa Ray MRN: [id number] Date of Birth: February 25, 1946  Today's Date: 03/18/2018 PT Individual Time: 1045-1130 PT Individual Time Calculation (min): 45 min   Short Term Goals:  Week 2:  PT Short Term Goal 1 (Week 2): Patient to be able to perform supine to sit with Min guard  PT Short Term Goal 2 (Week 2): Patient to be able to ascend/descend  8 steps with B railings and min guard  PT Short Term Goal 3 (Week 2): Patient to be able to perform functional transfers with S PT Short Term Goal 4 (Week 2): Patient to be able to ambulate 146ft with LRAD and S PT Short Term Goal 5 (Week 2): Patient to initiate floor to stand training   Skilled Therapeutic Interventions/Progress Updates:   Pt asleep in bed, and difficult to awaken.  She closed her eyes frequently during session, and seemed to have more difficulty processing/motor planning today vs 12/16, when this PT last treated pt.  neuromuscular re-education via multimodal cues for supine: bil bridging, adductor squeezes, lower trunk rotation.  Extra time needed to perform movements.  Pt unable to count reps aloud while performing movements.    Pt noted to have wet diaper and nightgown.  Rolling L><R with mod assist in flat bed to assist with peri care and changing diaper and gown.    Pt left resting in bed with alarm set and needs at hand.     Therapy Documentation Precautions:  Precautions Precautions: Fall Precaution Comments: h/o multiple falls, CHF and orthostatic hypotension Restrictions Weight Bearing Restrictions: No  Pain: Pain Assessment Pain Scale: 0-10 Pain Score: L neck, upper shoulder.  Pt unable to rate numerically; obviously in pain.  Eventually stated "it hurts a good bit".  Pt premedicated and had had hot pack on shoulder at start of session.    Therapy/Group: Individual Therapy  Bay Jarquin 03/18/2018, 12:25 PM

## 2018-03-18 NOTE — Progress Notes (Signed)
Occupational Therapy Session Note  Patient Details  Name: Theresa Ray MRN: [id number] Date of Birth: 06/14/1945  Today's Date: 03/18/2018 OT Individual Time: 1300-1405 OT Individual Time Calculation (min): 65 min    Short Term Goals: Week 2:  OT Short Term Goal 1 (Week 2): LTG=STG 2/2 ELOS  Skilled Therapeutic Interventions/Progress Updates:    Session focused on b/d tasks. Pt received supine with husband present initially. Pt completed functional mobility into bathroom and completed toileting with min A. She transferred into walk in shower with min A physically, but moderate cueing for sequencing. Pt very slow to process even simple commands this session. Pt required frequent guiding cues in shower to wash each body part but was able to do so with only min physical A. Pt donned shirt with min A for finding correct holes for arms/head. Max A to don pants d/t time constraints. Max A to blow dry hair d/t time constraints. Pt was returned to bed and left supine with all needs met, bed alarm set.   Therapy Documentation Precautions:  Precautions Precautions: Fall Precaution Comments: h/o multiple falls, CHF and orthostatic hypotension Restrictions Weight Bearing Restrictions: No    Vital Signs: Therapy Vitals Temp: 99.1 F (37.3 C) Temp Source: Oral Pulse Rate: 64 Resp: 17 BP: 140/65 Patient Position (if appropriate): Lying Oxygen Therapy SpO2: 99 % O2 Device: Room Air Pain:  No pain reported   Therapy/Group: Individual Therapy  Curtis Sites 03/18/2018, 3:41 PM

## 2018-03-18 NOTE — Progress Notes (Signed)
Physical Therapy Session Note  Patient Details  Name: Theresa Ray MRN: [id number] Date of Birth: 16-Feb-1946  Today's Date: 03/18/2018 PT Individual Time: 0803-0830 PT Individual Time Calculation (min): 27 min   Short Term Goals: Week 2:  PT Short Term Goal 1 (Week 2): Patient to be able to perform supine to sit with Min guard  PT Short Term Goal 2 (Week 2): Patient to be able to ascend/descend  8 steps with B railings and min guard  PT Short Term Goal 3 (Week 2): Patient to be able to perform functional transfers with S PT Short Term Goal 4 (Week 2): Patient to be able to ambulate 157ft with LRAD and S PT Short Term Goal 5 (Week 2): Patient to initiate floor to stand training   Skilled Therapeutic Interventions/Progress Updates: Pt presented in w/c agreeable to therapy. Pt stating pain in L shoulder/scap 9/10, nsg aware. Pt transported to rehab gym with PTA applying hot pack while performing seated therex with .75lb cuff. During therex nsg arrived to administer meds. Pt required tactile cues for bringing medicine cup completely to mouth initially and was able to complete after several repetitions. Pt completed in w/c LAQ, hip flexion 2 x 10 with .75cuffs and hamstring curls level 1 resistance band x 10. Pt ambulated back to room CGA and returned to bed minA for LE management. Pt repositioned for comfort and left with call bell within reach and husband present.      Therapy Documentation Precautions:  Precautions Precautions: Fall Precaution Comments: h/o multiple falls, CHF and orthostatic hypotension Restrictions Weight Bearing Restrictions: No General:   Vital Signs:  Pain: Pain Assessment Pain Scale: 0-10 Pain Score: 3    Therapy/Group: Individual Therapy  Samone Guhl  Jaymz Traywick, PTA  03/18/2018, 1:00 PM

## 2018-03-18 NOTE — Progress Notes (Signed)
Theresa Ray PHYSICAL MEDICINE & REHABILITATION PROGRESS NOTE   Subjective/Complaints: Appears to be back to baseline today.  I saw her when she was ambulating with therapy and patient demonstrated no problems and had no complaints.  ROS: Patient denies fever, rash, sore throat, blurred vision, nausea, vomiting, diarrhea, cough, shortness of breath or chest pain, joint or back pain, headache, or mood change.    Objective:   No results found. Recent Labs    03/17/18 2207  WBC 9.3  HGB 10.5*  HCT 32.2*  PLT 603*   Recent Labs    03/17/18 2207  NA 132*  K 4.8  CL 97*  CO2 22  GLUCOSE 216*  BUN 30*  CREATININE 1.53*  CALCIUM 9.0    Intake/Output Summary (Last 24 hours) at 03/18/2018 1702 Last data filed at 03/17/2018 1831 Gross per 24 hour  Intake 120 ml  Output -  Net 120 ml     Physical Exam: Vital Signs Blood pressure 140/65, pulse 64, temperature 99.1 F (37.3 C), temperature source Oral, resp. rate 17, height 5\' 3"  (1.6 m), weight 89.1 kg, SpO2 99 %.  Constitutional: No distress . Vital signs reviewed. HEENT: EOMI, oral membranes moist Neck: supple Cardiovascular: RRR without murmur. No JVD    Respiratory: CTA Bilaterally without wheezes or rales. Normal effort    GI: BS +, non-tender, non-distended  Musculoskeletal:  No edema Neurological: alert and attentive  Follows simple commands. Perseverates less Motor: LUE: 4-4+/5 proximal to distal--  LLE: HF, KE 3+to 4/5, ADF 4/5 RUE: 4/5 proximal to distal--stable  RLE: HF, KE 3-3+/5, ADF 3+/5 Skin: Skin is warm and dry. Vesicular rash on right buttock without change Psychiatric: restless     Assessment/Plan: 1. Functional deficits secondary to right MCA and left MCA infarcts which require 3+ hours per day of interdisciplinary therapy in a comprehensive inpatient rehab setting.  Physiatrist is providing close team supervision and 24 hour management of active medical problems listed below.  Physiatrist  and rehab team continue to assess barriers to discharge/monitor patient progress toward functional and medical goals  Care Tool:  Bathing    Body parts bathed by patient: Right arm, Left arm, Chest, Abdomen, Face, Front perineal area, Right upper leg, Left upper leg, Right lower leg, Left lower leg   Body parts bathed by helper: Buttocks     Bathing assist Assist Level: Minimal Assistance - Patient > 75%(max cueing)     Upper Body Dressing/Undressing Upper body dressing   What is the patient wearing?: Pull over shirt    Upper body assist Assist Level: Minimal Assistance - Patient > 75%    Lower Body Dressing/Undressing Lower body dressing      What is the patient wearing?: Underwear/pull up     Lower body assist Assist for lower body dressing: Maximal Assistance - Patient 25 - 49%(d/t time constraints)     Toileting Toileting    Toileting assist Assist for toileting: Minimal Assistance - Patient > 75%     Transfers Chair/bed transfer  Transfers assist     Chair/bed transfer assist level: Contact Guard/Touching assist     Locomotion Ambulation   Ambulation assist      Assist level: Contact Guard/Touching assist Assistive device: Walker-rolling Max distance: 73ft   Walk 10 feet activity   Assist     Assist level: Contact Guard/Touching assist Assistive device: Walker-rolling   Walk 50 feet activity   Assist Walk 50 feet with 2 turns activity did not occur: Safety/medical concerns  Assist level: Contact Guard/Touching assist Assistive device: Walker-rolling    Walk 150 feet activity   Assist Walk 150 feet activity did not occur: Safety/medical concerns         Walk 10 feet on uneven surface  activity   Assist Walk 10 feet on uneven surfaces activity did not occur: Safety/medical concerns         Wheelchair     Assist Will patient use wheelchair at discharge?: No Type of Wheelchair: Manual    Wheelchair assist level:  Minimal Assistance - Patient > 75% Max wheelchair distance: 30    Wheelchair 50 feet with 2 turns activity    Assist        Assist Level: Dependent - Patient 0%   Wheelchair 150 feet activity     Assist     Assist Level: Dependent - Patient 0%    Medical Problem List and Plan: 1.  Left side weakness secondary to acute right MCA, MCA/PCA and left punctate MCA infarctions  -Continue CIR therapies including PT, OT, and SLP      -loop recorder in place 2.  DVT Prophylaxis/Anticoagulation: SCDs. Monitor for any signs of DVT 3. Pain Management:  Tylenol as needed 4. Mood:  Celexa 10 mg daily. Provide emotional support 5. Neuropsych: This patient is capable of making decisions on her own behalf. 6. Skin/Wound Care:  Routine skin checks 7. Fluids/Electrolytes/Nutrition:  encourage PO  Fair intake thus far  8.  Left ureteral stone with hydronephrosis. Status post stenting.  Follow-up urology services [facility]  -monitor urine output/Cr  -BUN/Cr trending up---push fluids  -recheck bmet thrusday  9. CAD/CABG. Continue aspirin and Plavix 10. Hypertension. Lisinopril 5 mg daily, Coreg 12.5 mg twice a day, Norvasc 10 mg daily.    -fair control 12/18 11. Diabetes mellitus.Hemoglobin A1c 6.0.SSI.  patient on Glucotrol 10 mg twice a day and Glucophage 1000 mg twice a day prior to admission.     -checking cbgs ac and hs  -resumed glucotrol, increased to 10mg  bid on 12/14---reasonable control 12. AKI. Push po 13. Fever with leukocytosis.resolved.   l  14. Hyperlipidemia. Lipitor 15.  Hypothyroidism. Synthroid 16. 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

## 2018-03-18 NOTE — Progress Notes (Signed)
Speech Language Pathology Weekly Progress and Session Note  Patient Details  Name: Theresa Ray MRN: [id number] Date of Birth: May 05, 1945  Beginning of progress report period: March 10, 2018 End of progress report period: March 18, 2018  Today's Date: 03/18/2018 SLP Individual Time: 0700-0755 SLP Individual Time Calculation (min): 55 min  Short Term Goals: Week 1: SLP Short Term Goal 1 (Week 1): Patient will consume current diet with minimal overt s/s of aspiration with Mod I for use of swallowing strategies.  SLP Short Term Goal 1 - Progress (Week 1): Met SLP Short Term Goal 2 (Week 1): Patient will demonstrate sustained attention to functional tasks for 10 minutes with Mod A verbal cues for redirection.  SLP Short Term Goal 2 - Progress (Week 1): Met SLP Short Term Goal 3 (Week 1): Patient will initiate functional tasks with Min A verbal cues. SLP Short Term Goal 3 - Progress (Week 1): Met SLP Short Term Goal 4 (Week 1): Patient will demonstrate functional problem solving for basic and familiar tasks with Mod A verbal cues.  SLP Short Term Goal 4 - Progress (Week 1): Met    New Short Term Goals: Week 2: SLP Short Term Goal 1 (Week 2): Patient will demonstrate functional problem solving for mildly complex tasks with Min A verbal cues.  SLP Short Term Goal 2 (Week 2): Patient will demonstrate selective attention in a mildly distracting enviornment for ~45 minutes with Min A verbal cues for redirection.  SLP Short Term Goal 3 (Week 2): Patient will recall new, daily information with Min A verbal and visual cues. SLP Short Term Goal 4 (Week 2): Patient will self-monitor and correct errors during functional tasks with Min A verbal cues.   Weekly Progress Updates: Patient has made excellent gains and has met 4 of 4 STGs this reporting period. Currently, patient requires overall Mod A verbal cues for sustained attention and functional problem solving with increased ability to initiate  tasks. Patient is also consuming regular textures with thin liquids without overt s/s of aspiration and is Mod I for use of swallowing compensatory strategies. Patient and family education is ongoing. Patient would benefit from continued skilled SLP intervention in order to maximize her cognitive functioning and overall functional independence prior to discharge.      Intensity: Minumum of 1-2 x/day, 30 to 90 minutes Frequency: 3 to 5 out of 7 days Duration/Length of Stay: 03/24/18 Treatment/Interventions: Cognitive remediation/compensation;Cueing hierarchy;Environmental controls;Functional tasks;Patient/family education;Therapeutic Activities;Internal/external aids   Daily Session  Skilled Therapeutic Interventions: Skilled treatment session focused on cognitive goals. Upon arrival, patient was awake while upright in the bed. SLP facilitated session by providing extra time and overall Min A verbal cues for functional problem solving during transfers and for clothing management while on commode. Patient reported pain in left shoulder (RN aware) which was severely distracting to the the patient throughout the session and required Max encouragement for redirection to both self-feeding and a cognitive task. Patient repeatedly requesting to get back into bed and required encouragement to stay up for next session. Patient left upright in wheelchair with all needs within reach and husband present. Continue with current plan of care.   Pain Pain Assessment Pain Scale: 0-10 Pain Score: 7  Pain Type: Acute pain Pain Location: Shoulder Pain Orientation: Left Pain Descriptors / Indicators: Aching Pain Frequency: Intermittent Pain Onset: On-going Patients Stated Pain Goal: 0 Pain Intervention(s): Medication (See eMAR);Repositioned;Heat applied Multiple Pain Sites: No  Therapy/Group: Individual Therapy  Emelda Kohlbeck 03/18/2018, 11:05  AM

## 2018-03-19 ENCOUNTER — Inpatient Hospital Stay (HOSPITAL_COMMUNITY): Payer: Medicare HMO | Admitting: Speech Pathology

## 2018-03-19 ENCOUNTER — Inpatient Hospital Stay (HOSPITAL_COMMUNITY): Payer: Medicare HMO | Admitting: Occupational Therapy

## 2018-03-19 ENCOUNTER — Inpatient Hospital Stay (HOSPITAL_COMMUNITY): Payer: Medicare HMO | Admitting: Physical Therapy

## 2018-03-19 LAB — GLUCOSE, CAPILLARY
Glucose-Capillary: 150 mg/dL — ABNORMAL HIGH (ref 70–99)
Glucose-Capillary: 219 mg/dL — ABNORMAL HIGH (ref 70–99)
Glucose-Capillary: 231 mg/dL — ABNORMAL HIGH (ref 70–99)
Glucose-Capillary: 129 mg/dL — ABNORMAL HIGH (ref 70–99)

## 2018-03-19 LAB — BASIC METABOLIC PANEL
Anion gap: 13 (ref 5–15)
BUN: 18 mg/dL (ref 8–23)
CO2: 24 mmol/L (ref 22–32)
Calcium: 9.3 mg/dL (ref 8.9–10.3)
Chloride: 100 mmol/L (ref 98–111)
Creatinine, Ser: 1.22 mg/dL — ABNORMAL HIGH (ref 0.44–1.00)
GFR calc Af Amer: 52 mL/min — ABNORMAL LOW (ref >60)
GFR calc non Af Amer: 45 mL/min — ABNORMAL LOW (ref >60)
GLUCOSE: 161 mg/dL — AB (ref 70–99)
Potassium: 4.2 mmol/L (ref 3.5–5.1)
Sodium: 137 mmol/L (ref 135–145)

## 2018-03-19 MED ORDER — METFORMIN HCL 500 MG PO TABS
500.0000 mg | ORAL_TABLET | Freq: Two times a day (BID) | ORAL | Status: DC
  Administered 2018-03-19 – 2018-03-24 (×10): 500 mg via ORAL
  Filled 2018-03-19 (×10): qty 1

## 2018-03-19 NOTE — Patient Care Conference (Signed)
Inpatient RehabilitationTeam Conference and Plan of Care Update Date: 03/17/2018   Time: 2:45 PM    Patient Name: Theresa Ray      Medical Record Number: [id number]  Date of Birth: [birth year] Sex: Female         Room/Bed: 4W21C/4W21C-01 Payor Info: Payor: [employer] MEDICARE / Plan: [place] MEDICARE HMO/PPO / Product Type: *No Product type* /    Admitting Diagnosis: bilateral infarcts  Admit Date/Time:  03/09/2018  6:29 PM Admission Comments: No comment available   Primary Diagnosis:  <principal problem not specified> Principal Problem: <principal problem not specified>  Patient Active Problem List   Diagnosis Date Noted  . Major depression, chronic   . 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 Hypothyroidism   . Dyslipidemia   . Leukocytosis   . AKI (acute kidney injury) ([place])   . Benign essential HTN   . Hydronephrosis with urinary obstruction due to renal calculus   . Chronic diastolic congestive heart failure ([place])   . Diabetes mellitus type 2 in obese ([place])   . Orthostasis   . Tachypnea   . Hypokalemia   . Severe sepsis ([place]) 03/02/2018  . Acute CVA (cerebrovascular accident) ([place])   . Pyelonephritis   . CAD (coronary artery disease) 05/12/2017  . Postoperative atrial fibrillation ([place]) 05/12/2017  . Orthostatic hypotension 03/28/2017  . Syncope 03/28/2017  . Tobacco abuse 03/18/2017  . Dizziness 03/18/2017  . Pressure injury of skin 02/26/2017  . NSTEMI (non-ST elevated myocardial infarction) ([place]) 02/16/2017  . Type 2 diabetes mellitus with diabetic neuropathy, without long-term current use of insulin ([place])   . Essential hypertension   . Hyperlipidemia     Expected Discharge Date: Expected Discharge Date: 03/24/18  Team Members Present: Physician leading conference: Dr. Alger Simons Social Worker Present: Lennart Pall, LCSW PT Present: Roderic Ovens, PT OT Present: Darleen Crocker, OT SLP Present: Weston Anna, SLP PPS Coordinator present : Daiva Nakayama,  RN, CRRN;Melissa Gertie Fey     Current Status/Progress Goal Weekly Team Focus  Medical   Patient with improvement in awareness although ongoing memory and cognitive deficits.  Did develop an HSV-1 rash on her buttock.  Working on better diabetic control  See prior  CBG control, treatment of HSV 1, nutrition   Bowel/Bladder   Continent with occassional incontinence LBM 12-16  continent of B/B with min assist regular bowel pattern  timed toileting q 2-3 hrs laxatives prn   Swallow/Nutrition/ Hydration             ADL's   Min-CGA overall  Supervision  balance, functional transfers, activity tolerance, UB strengthening, initiation, awareness   Mobility   min A gait, transfers and stairs  supervision bed mobility, transfers, balance, and gait; CGA for stairs, and min assist floor transfer  NMR, cognition, family ed, gait   Communication             Safety/Cognition/ Behavioral Observations  Mod-Max A  Min A  attention, problem solving, awareness    Pain   pain managed with prn tylenol   pt will be free of pain  assess pain q shift and prn   Skin   sutures to vagina   no breakdown or infection  assess skin qshift and prn    Rehab Goals Patient on target to meet rehab goals: Yes *See Care Plan and progress notes for long and short-term goals.     Barriers to Discharge  Current Status/Progress Possible Resolutions Date Resolved   Physician  Medical stability        Seen medical problem list      Nursing                  PT                    OT                  SLP                SW                Discharge Planning/Teaching Needs:  Pt to d/c home with with spouse who will not be returning to work until the 1st of the year.  Spouse to provide 24/7 support and pt's siblings able to assist as well.  Teaching needs TBD closer to d/c.   Team Discussion:  Medically improved overall. Team reports some new confusion today - ?cause - MD aware.  Min-CGA with ADLs; min gait.   Supervision goals overall.  Revisions to Treatment Plan:  NA    Continued Need for Acute Rehabilitation Level of Care: The patient requires daily medical management by a physician with specialized training in physical medicine and rehabilitation for the following conditions: Daily direction of a multidisciplinary physical rehabilitation program to ensure safe treatment while eliciting the highest outcome that is of practical value to the patient.: Yes Daily medical management of patient stability for increased activity during participation in an intensive rehabilitation regime.: Yes Daily analysis of laboratory values and/or radiology reports with any subsequent need for medication adjustment of medical intervention for : Neurological problems;Blood pressure problems;Diabetes problems   I attest that I was present, lead the team conference, and concur with the assessment and plan of the team.   Sylena Lotter 03/19/2018, 3:27 PM

## 2018-03-19 NOTE — Progress Notes (Signed)
Occupational Therapy Session Note  Patient Details  Name: Theresa Ray MRN: [id number] Date of Birth: 1945/06/16  Today's Date: 03/19/2018  Session 1 OT Individual Time: 5462-7035 OT Individual Time Calculation (min): 57 min   Session 2 OT Individual Time: 0093-8182 OT Individual Time Calculation (min): 45 min   Short Term Goals: Week 2:  OT Short Term Goal 1 (Week 2): LTG=STG 2/2 ELOS  Skilled Therapeutic Interventions/Progress Updates:  Session 1   Pt greeted asleep in bed upon OT arrival, easy to wake and agreeable to OT treatment session. Pt reports need to urinate. Pt requires increased time and verbal cues to initiate transition OOB. Verbal cues for hand placement with sit<>stand and not pull up on RW. Pt ambulated to bathroom and transferred onto toilet with verbal cues to sequence pulling off pants prior to sitting. Pt voided bladder and complete peri-care without assistance. Standing balance/endurance with standing hand washing and brushing teeth. Verbal cues for RW positioning at the sink. Dressing completed seated EOB with min A and verbal cues for technique. Pt ambulated to wc with RW and CGA. Intermittent resting tremor noted in R hand. Pt reports she has noticed increased tremor as well. No tremor noted when eating breakfast. Pt left with safety alarm belt on and needs met.   Session 2 Pt greeted seated on toilet after successful BM. Worked on toileting strategies with pt able to complete peri-care with set-up A and OT assist for thoroughness. Verbal cues to initiate pulling pants up and not sitting back onto commode. Pt ambulated out of bathroom to wc with RW and CGA. Pt brought to dayroom in wc for time management. Pt unable to do simple 2 color peg board puzzle in horizontal line. Pt trying to place pegs on picture and not on board. Max multimodal cues and hand over hand A to place 2 pegs in a row. Pt perseverative on trying to look through pictures of patterns and needed max  multimodal cues to attend to the picture at hand. Pt unable to sequence pattern and once she understood to place pegs on the board, she would place them at random and not in line. Noted R hand resting tremor again this afternoon, tremor stopped when reaching to place pegs back in the bucket- per pt's spouse, tremor has been occurring for the past year.  Pt continues to be more confused with increased difficulty motor planning or solving simple problems. Pt returned to bed at end of session and left semi-reclined with needs met.  Therapy Documentation Precautions:  Precautions Precautions: Fall Precaution Comments: h/o multiple falls, CHF and orthostatic hypotension Restrictions Weight Bearing Restrictions: No Pain: Pain Assessment Pain Scale: 0-10 Pain Score: 2  Pain Type: Acute pain Pain Location: Neck Pain Orientation: Posterior;Mid Pain Descriptors / Indicators: Aching Pain Frequency: Intermittent Pain Onset: Gradual Patients Stated Pain Goal:  Pain Intervention(s): Repositioned Multiple Pain Sites: No   Therapy/Group: Individual Therapy  Valma Cava 03/19/2018, 3:40 PM

## 2018-03-19 NOTE — Progress Notes (Signed)
[facility] PHYSICAL MEDICINE & REHABILITATION PROGRESS NOTE   Subjective/Complaints: Up eating breakfast. Asked when she can go home.  ROS: Patient denies fever, rash, sore throat, blurred vision, nausea, vomiting, diarrhea, cough, shortness of breath or chest pain, joint or back pain, headache, or mood change.   Objective:   No results found. Recent Labs    03/17/18 2207  WBC 9.3  HGB 10.5*  HCT 32.2*  PLT 603*   Recent Labs    03/17/18 2207 03/19/18 0527  NA 132* 137  K 4.8 4.2  CL 97* 100  CO2 22 24  GLUCOSE 216* 161*  BUN 30* 18  CREATININE 1.53* 1.22*  CALCIUM 9.0 9.3    Intake/Output Summary (Last 24 hours) at 03/19/2018 1027 Last data filed at 03/18/2018 1800 Gross per 24 hour  Intake 280 ml  Output -  Net 280 ml     Physical Exam: Vital Signs Blood pressure (!) 142/63, pulse 73, temperature 99.1 F (37.3 C), temperature source Oral, resp. rate 18, height 5\' 3"  (1.6 m), weight 87.5 kg, SpO2 94 %.  Constitutional: No distress . Vital signs reviewed. HEENT: EOMI, oral membranes moist Neck: supple Cardiovascular: RRR without murmur. No JVD    Respiratory: CTA Bilaterally without wheezes or rales. Normal effort    GI: BS +, non-tender, non-distended  Musculoskeletal:  No edema Neurological: alert and attentive. Intentional tremor RUE Follows simple commands. Still can perseverate Motor: LUE: 4-4+/5 proximal to distal--  LLE: HF, KE 3+to 4/5, ADF 4/5 RUE: 4/5 proximal to distal--stable  RLE: HF, KE  3+/5, ADF 3+/5 Skin: Skin is warm and dry. Vesicular rash on right buttock healing Psychiatric: calm  Assessment/Plan: 1. Functional deficits secondary to right MCA and left MCA infarcts which require 3+ hours per day of interdisciplinary therapy in a comprehensive inpatient rehab setting.  Physiatrist is providing close team supervision and 24 hour management of active medical problems listed below.  Physiatrist and rehab team continue to assess  barriers to discharge/monitor patient progress toward functional and medical goals  Care Tool:  Bathing    Body parts bathed by patient: Right arm, Left arm, Chest, Abdomen, Face, Front perineal area, Right upper leg, Left upper leg, Right lower leg, Left lower leg   Body parts bathed by helper: Buttocks     Bathing assist Assist Level: Minimal Assistance - Patient > 75%(max cueing)     Upper Body Dressing/Undressing Upper body dressing   What is the patient wearing?: Pull over shirt    Upper body assist Assist Level: Minimal Assistance - Patient > 75%    Lower Body Dressing/Undressing Lower body dressing      What is the patient wearing?: Underwear/pull up     Lower body assist Assist for lower body dressing: Maximal Assistance - Patient 25 - 49%(d/t time constraints)     Toileting Toileting    Toileting assist Assist for toileting: Minimal Assistance - Patient > 75%     Transfers Chair/bed transfer  Transfers assist     Chair/bed transfer assist level: Contact Guard/Touching assist     Locomotion Ambulation   Ambulation assist      Assist level: Contact Guard/Touching assist Assistive device: Walker-rolling Max distance: 63ft   Walk 10 feet activity   Assist     Assist level: Contact Guard/Touching assist Assistive device: Walker-rolling   Walk 50 feet activity   Assist Walk 50 feet with 2 turns activity did not occur: Safety/medical concerns  Assist level: Contact Guard/Touching assist Assistive  device: Walker-rolling    Walk 150 feet activity   Assist Walk 150 feet activity did not occur: Safety/medical concerns         Walk 10 feet on uneven surface  activity   Assist Walk 10 feet on uneven surfaces activity did not occur: Safety/medical concerns         Wheelchair     Assist Will patient use wheelchair at discharge?: No Type of Wheelchair: Manual    Wheelchair assist level: Minimal Assistance - Patient >  75% Max wheelchair distance: 30    Wheelchair 50 feet with 2 turns activity    Assist        Assist Level: Dependent - Patient 0%   Wheelchair 150 feet activity     Assist     Assist Level: Dependent - Patient 0%    Medical Problem List and Plan: 1.  Left side weakness secondary to acute right MCA, MCA/PCA and left punctate MCA infarctions  -Continue CIR therapies including PT, OT, and SLP      -loop recorder in place 2.  DVT Prophylaxis/Anticoagulation: SCDs. Monitor for any signs of DVT 3. Pain Management:  Tylenol as needed 4. Mood:  Celexa 10 mg daily. Provide emotional support 5. Neuropsych: This patient is capable of making decisions on her own behalf. 6. Skin/Wound Care:  Routine skin checks 7. Fluids/Electrolytes/Nutrition:  encourage PO liquids  Fair intake thus far   -I personally reviewed the patient's labs today.   8.  Left ureteral stone with hydronephrosis. Status post stenting.  Follow-up urology services [facility]  -monitor urine output/Cr  -BUN/Cr improved today (18/1.22)   9. CAD/CABG. Continue aspirin and Plavix 10. Hypertension. Lisinopril 5 mg daily, Coreg 12.5 mg twice a day, Norvasc 10 mg daily.    -fair control 12/18 11. Diabetes mellitus.Hemoglobin A1c 6.0.SSI.  patient on Glucotrol 10 mg twice a day and Glucophage 1000 mg twice a day prior to admission.     -checking cbgs ac and hs  -resumed glucotrol, increased to 10mg  bid on 12/14---improved control  -resume glucophage at 500mg  bid 12. AKI. Push po 13. Fever with leukocytosis.resolved.   l  14. Hyperlipidemia. Lipitor 15.  Hypothyroidism. 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

## 2018-03-19 NOTE — Progress Notes (Signed)
Speech Language Pathology Daily Session Note  Patient Details  Name: Theresa Ray MRN: [id number] Date of Birth: [birth year]  Today's Date: 03/19/2018 SLP Individual Time: 9169-4503 SLP Individual Time Calculation (min): 60 min  Short Term Goals: Week 2: SLP Short Term Goal 1 (Week 2): Patient will demonstrate functional problem solving for mildly complex tasks with Min A verbal cues.  SLP Short Term Goal 2 (Week 2): Patient will demonstrate selective attention in a mildly distracting enviornment for ~45 minutes with Min A verbal cues for redirection.  SLP Short Term Goal 3 (Week 2): Patient will recall new, daily information with Min A verbal and visual cues. SLP Short Term Goal 4 (Week 2): Patient will self-monitor and correct errors during functional tasks with Min A verbal cues.   Skilled Therapeutic Interventions: Skilled treatment session focused on cognitive goals. SLP facilitated session by administering the MoCA-Version 7.2. Patient scored 14/30 points with a score of 26 or above considered normal. Patient demonstrated deficits in executive functioning, attention, recall, problem solving and abstract reasoning. Throughout session, patient demonstrated intermittent language of confusion and demonstrated increased difficulty with following directions and attention to tasks. RN aware. Patient left upright in wheelchair with alarm on and all needs within reach. Continue with current plan of care.      Pain No/Denies Pain   Therapy/Group: Individual Therapy  Lynett Brasil 03/19/2018, 4:03 PM

## 2018-03-19 NOTE — Progress Notes (Signed)
Physical Therapy Session Note  Patient Details  Name: KYTZIA GIENGER MRN: [id number] Date of Birth: 12/08/1945  Today's Date: 03/19/2018 PT Individual Time: 9407-6808 PT Individual Time Calculation (min): 47 min   Short Term Goals: Week 2:  PT Short Term Goal 1 (Week 2): Patient to be able to perform supine to sit with Min guard  PT Short Term Goal 2 (Week 2): Patient to be able to ascend/descend  8 steps with B railings and min guard  PT Short Term Goal 3 (Week 2): Patient to be able to perform functional transfers with S PT Short Term Goal 4 (Week 2): Patient to be able to ambulate 167ft with LRAD and S PT Short Term Goal 5 (Week 2): Patient to initiate floor to stand training   Skilled Therapeutic Interventions/Progress Updates:    pt c/o fatigue but is willing to participate.  Gait 100', 25' x 2 with RW and min A, mod/max cuing for RW safety especially with turning and avoiding obstacles.  Stair negotiation 2 x 4 steps with 1 handrail with min A.  Pt requires prolonged rest breaks due to fatigue.  nustep x 7 minutes with mod/max cuing to continue task, pt with difficulty keeping eyes open.  Pt performs stand pivot transfer to bed with min A, mod A for sit to supine due to fatigue. Pt left in bed with needs at hand, alarm set, hot pack applied to Lt shoulder and neck.  Missed 28 minutes skilled PT due to fatigue  Therapy Documentation Precautions:  Precautions Precautions: Fall Precaution Comments: h/o multiple falls, CHF and orthostatic hypotension Restrictions Weight Bearing Restrictions: No General: PT Amount of Missed Time (min): 28 Minutes PT Missed Treatment Reason: Patient fatigue Pain:  pt c/o neck pain, RN made aware, hot pack applied after session      Therapy/Group: Individual Therapy  Jantz Main 03/19/2018, 11:35 AM

## 2018-03-19 NOTE — Progress Notes (Signed)
Social Work Patient ID: Theresa Ray, female   DOB: 12-10-1945, 72 y.o.   MRN: [id number]  Have reviewed team conference with pt and spouse.  Pt lying in bed and still with tangential talk at times.  Spouse redirects or corrects as needed.  Spouse does feel that pt is "better than Tuesday" with cognition, however, not back to baseline.   Reviewed DME and HH follow up needs and will make referrals.  Both agreeable with continued d/c target of 03/24/18 and pt excited to be home for Xmas (and her birthday).  Coco Sharpnack, LCSW

## 2018-03-20 ENCOUNTER — Inpatient Hospital Stay (HOSPITAL_COMMUNITY): Payer: Medicare HMO

## 2018-03-20 ENCOUNTER — Inpatient Hospital Stay (HOSPITAL_COMMUNITY): Payer: Medicare HMO | Admitting: Physical Therapy

## 2018-03-20 ENCOUNTER — Ambulatory Visit: Payer: Medicare HMO

## 2018-03-20 ENCOUNTER — Inpatient Hospital Stay (HOSPITAL_COMMUNITY): Payer: Medicare HMO | Admitting: Speech Pathology

## 2018-03-20 DIAGNOSIS — R7309 Other abnormal glucose: Secondary | ICD-10-CM

## 2018-03-20 DIAGNOSIS — E119 Type 2 diabetes mellitus without complications: Secondary | ICD-10-CM

## 2018-03-20 DIAGNOSIS — N179 Acute kidney failure, unspecified: Secondary | ICD-10-CM

## 2018-03-20 DIAGNOSIS — R0989 Other specified symptoms and signs involving the circulatory and respiratory systems: Secondary | ICD-10-CM

## 2018-03-20 DIAGNOSIS — B009 Herpesviral infection, unspecified: Secondary | ICD-10-CM

## 2018-03-20 DIAGNOSIS — N201 Calculus of ureter: Secondary | ICD-10-CM

## 2018-03-20 LAB — URINE CULTURE

## 2018-03-20 LAB — GLUCOSE, CAPILLARY
GLUCOSE-CAPILLARY: 122 mg/dL — AB (ref 70–99)
GLUCOSE-CAPILLARY: 95 mg/dL (ref 70–99)
Glucose-Capillary: 140 mg/dL — ABNORMAL HIGH (ref 70–99)
Glucose-Capillary: 185 mg/dL — ABNORMAL HIGH (ref 70–99)

## 2018-03-20 NOTE — Discharge Instructions (Signed)
Inpatient Rehab Discharge Instructions  Theresa Ray Discharge date and time: No discharge date for patient encounter.   Activities/Precautions/ Functional Status: Activity: activity as tolerated Diet: diabetic diet Wound Care: none needed Functional status:  ___ No restrictions     ___ Walk up steps independently ___ 24/7 supervision/assistance   ___ Walk up steps with assistance ___ Intermittent supervision/assistance  ___ Bathe/dress independently ___ Walk with walker     _x__ Bathe/dress with assistance ___ Walk Independently    ___ Shower independently ___ Walk with assistance    ___ Shower with assistance ___ No alcohol     ___ Return to work/school ________    COMMUNITY REFERRALS UPON DISCHARGE:    Home Health:   PT     OT     ST                     Agency:  [facility] Phone: [phone]   Medical Equipment/Items Ordered: wheelchair, cushion, walker, tub bench                                                     Agency/Supplier:  [facility] @ [phone]   GENERAL COMMUNITY RESOURCES FOR PATIENT/FAMILY:  Support Groups:  Stroke Support Group (see flyer for info)       Special Instructions:  Follow-up with urology services at [facility] after recent stenting [place] Cigarette smoking nearly doubles your risk of having a stroke & is the single most alterable risk factor  If you smoke or have smoked in the last 12 months, you are advised to quit smoking for your health.  Most of the excess cardiovascular risk related to smoking disappears within a year of stopping.  Ask you doctor about anti-smoking medications  [place] Quit Line: 1-800-QUIT NOW  Free Smoking Cessation Classes [phone]  CHOLESTEROL Know your levels; limit fat & cholesterol in your diet  Lipid Panel     Component Value Date/Time   CHOL 167 03/03/2018 1046   TRIG 157 (H) 03/03/2018 1046   HDL 37 (L) 03/03/2018 1046   CHOLHDL 4.5  03/03/2018 1046   VLDL 31 03/03/2018 1046   LDLCALC 99 03/03/2018 1046      Many patients benefit from treatment even if their cholesterol is at goal.  Goal: Total Cholesterol (CHOL) less than 160  Goal:  Triglycerides (TRIG) less than 150  Goal:  HDL greater than 40  Goal:  LDL (LDLCALC) less than 100   BLOOD PRESSURE American Stroke Association blood pressure target is less that 120/80 mm/Hg  Your discharge blood pressure is:  BP: (!) 173/79  Monitor your blood pressure  Limit your salt and alcohol intake  Many individuals will require more than one medication for high blood pressure  DIABETES (A1c is a blood sugar average for last 3 months) Goal HGBA1c is under 7% (HBGA1c is blood sugar average for last 3 months)  Diabetes:   Lab Results  Component Value Date   HGBA1C 6.0 (H) 03/03/2018     Your HGBA1c can be lowered with medications, healthy diet, and exercise.  Check your blood sugar as directed by your physician  Call your physician if you experience unexplained or low blood sugars.  PHYSICAL ACTIVITY/REHABILITATION Goal is 30 minutes at least 4 days per week  Activity: Increase activity slowly, Therapies: Physical Therapy: Home Health Return to work:   Activity decreases your risk of heart attack and stroke and makes your heart stronger.  It helps control your weight and blood pressure; helps you relax and can improve your mood.  Participate in a regular exercise program.  Talk with your doctor about the best form of exercise for you (dancing, walking, swimming, cycling).  DIET/WEIGHT Goal is to maintain a healthy weight  Your discharge diet is:  Diet Order            DIET DYS 3 Room service appropriate? Yes; Fluid consistency: Thin  Diet effective now              liquids Your height is:  Height: 5\' 3"  (160 cm) Your current weight is: Weight: 86.4 kg Your Body Mass Index (BMI) is:  BMI (Calculated): 33.75  Following the type of diet specifically  designed for you will help prevent another stroke.  Your goal weight range is:    Your goal Body Mass Index (BMI) is 19-24.  Healthy food habits can help reduce 3 risk factors for stroke:  High cholesterol, hypertension, and excess weight.  RESOURCES Stroke/Support Group:  Call [phone]   STROKE EDUCATION PROVIDED/REVIEWED AND GIVEN TO PATIENT Stroke warning signs and symptoms How to activate emergency medical system (call 911). Medications prescribed at discharge. Need for follow-up after discharge. Personal risk factors for stroke. Pneumonia vaccine given:  Flu vaccine given:  My questions have been answered, the writing is legible, and I understand these instructions.  I will adhere to these goals & educational materials that have been provided to me after my discharge from the hospital.     My questions have been answered and I understand these instructions. I will adhere to these goals and the provided educational materials after my discharge from the hospital.  Patient/Caregiver Signature _______________________________ Date __________  Clinician Signature _______________________________________ Date __________  Please bring this form and your medication list with you to all your follow-up doctor's appointments.

## 2018-03-20 NOTE — Plan of Care (Signed)
  Problem: RH BLADDER ELIMINATION Goal: RH STG MANAGE BLADDER WITH ASSISTANCE Description STG Manage Bladder With supervision Assistance  Outcome: Progressing   Problem: RH SKIN INTEGRITY Goal: RH STG SKIN FREE OF INFECTION/BREAKDOWN Description Skin free of breakdown/infection with min assist  Outcome: Progressing Goal: RH STG MAINTAIN SKIN INTEGRITY WITH ASSISTANCE Description STG Maintain Skin Integrity With min Assistance.  Outcome: Not Progressing   Problem: RH SAFETY Goal: RH STG ADHERE TO SAFETY PRECAUTIONS W/ASSISTANCE/DEVICE Description STG Adhere to Safety Precautions With  Assistance/Device. min  Outcome: Progressing Goal: RH STG DECREASED RISK OF FALL WITH ASSISTANCE Description STG Decreased Risk of Fall With Assistance. min  Outcome: Progressing   Problem: RH PAIN MANAGEMENT Goal: RH STG PAIN MANAGED AT OR BELOW PT'S PAIN GOAL Description Pain at or below 3 with min assistance  Outcome: Progressing   Problem: RH KNOWLEDGE DEFICIT Goal: RH STG INCREASE KNOWLEDGE OF DYSPHAGIA/FLUID INTAKE Description Patient/family able to verbalize approved foods for  dysphagia 3 with min assistance  Outcome: Progressing

## 2018-03-20 NOTE — Progress Notes (Signed)
Physical Therapy Session Note  Patient Details  Name: Theresa Ray MRN: [id number] Date of Birth: 1946/03/01  Today's Date: 03/20/2018 PT Individual Time: 0700-0755 PT Individual Time Calculation (min): 55 min   Short Term Goals: Week 2:  PT Short Term Goal 1 (Week 2): Patient to be able to perform supine to sit with Min guard  PT Short Term Goal 2 (Week 2): Patient to be able to ascend/descend  8 steps with B railings and min guard  PT Short Term Goal 3 (Week 2): Patient to be able to perform functional transfers with S PT Short Term Goal 4 (Week 2): Patient to be able to ambulate 118ft with LRAD and S PT Short Term Goal 5 (Week 2): Patient to initiate floor to stand training   Skilled Therapeutic Interventions/Progress Updates:    pt rec'd in bed, requesting shower.  Pt perform supine to sit with increased time, mod cuing and min A.  Min A for sit to stand and gait with RW throughout room.  Min/mod A for safety with RW, Lt attention and working memory.  Toilet and shower transfers with min A, mod cuing.  Pt performs shower with supervision for sitting balance, mod cues for sequencing and working memory, assistance for washing back.  Pt performs dressing with min A, increased time and cuing.  Gait training with turns with RW with min A due to LOB with impaired balance reactions noted, continues to require mod/max cuing for safety with RW.  Pt performs grooming at sink with set up assist.  Pt left with breakfast set up, needs at hand, nursing present.  Therapy Documentation Precautions:  Precautions Precautions: Fall Precaution Comments: h/o multiple falls, CHF and orthostatic hypotension Restrictions Weight Bearing Restrictions: (P) No Pain:  no c/o pain   Therapy/Group: Individual Therapy  Sharmayne Jablon 03/20/2018, 7:56 AM

## 2018-03-20 NOTE — Progress Notes (Signed)
Speech Language Pathology Daily Session Note  Patient Details  Name: Theresa Ray MRN: [id number] Date of Birth: [birth year]  Today's Date: 03/20/2018 SLP Individual Time: 0806-0902 SLP Individual Time Calculation (min): 56 min  Short Term Goals: Week 2: SLP Short Term Goal 1 (Week 2): Patient will demonstrate functional problem solving for mildly complex tasks with Min A verbal cues.  SLP Short Term Goal 2 (Week 2): Patient will demonstrate selective attention in a mildly distracting enviornment for ~45 minutes with Min A verbal cues for redirection.  SLP Short Term Goal 3 (Week 2): Patient will recall new, daily information with Min A verbal and visual cues. SLP Short Term Goal 4 (Week 2): Patient will self-monitor and correct errors during functional tasks with Min A verbal cues.   Skilled Therapeutic Interventions:  Pt was seen for skilled ST targeting cognitive goals.  Pt was received sitting upright in wheelchair upon therapist's arrival, awake, alert, and agreeable to participating in [name].  Pt recalled assessment from yesterday's therapy session but seemed to have poor awareness of the purpose of cognitive evaluation or its results.  She simply stated that the assessment tested "arithmetic."  SLP facilitated the session with word problems from the ALFA standardized cognitive assessment to address goals for functional problem solving, error awareness, and selective attention.  Pt needed overall max assist for task organization and working memory when completing functional math calculations from word problems.  She was able to attend to task for ~1-2 minute intervals before requiring mod cues for redirection.  Pt also demonstrated lack of insight into the nature of her current deficits as evidenced by comments made throughout therapy session.  SLP reviewed and reinforced rationale for ST interventions and provided skilled education with pt's husband regarding ways to improve pt's awareness of  her deficits.   Pt was returned to room and left in bathroom on commode with nursing present.  Continue per current plan of care.    Pain Pain Assessment Pain Scale: 0-10 Pain Score: 0-No pain  Therapy/Group: Individual Therapy  Theresa Ray, Theresa Ray 03/20/2018, 9:03 AM

## 2018-03-20 NOTE — Progress Notes (Signed)
Occupational Therapy Session Note  Patient Details  Name: Theresa Ray MRN: [id number] Date of Birth: [birth year]  Today's Date: 03/20/2018 OT Individual Time: 1115-1200 OT Individual Time Calculation (min): 45 min   Short Term Goals: Week 2:  OT Short Term Goal 1 (Week 2): LTG=STG 2/2 ELOS  Skilled Therapeutic Interventions/Progress Updates:    Session focused on toileting tasks, standing tolerace, and BUE strengthening. Pt completed bed mobility with moderate cueing for task progression but CGA overall. Pt used RW to complete functional mobility into bathroom with CGA. Pt completed toileting tasks with CGA. Pt was then brought into therapy gym where she completed functional reaching in standing. RW management cueing provided. Pt required min-mod cueing for task progression and instruction. Pt frequently would forget what she was doing during the task, requiring redirection. Pt ended session with brief intervals on B UE ergometer to increase functional UE endurance. Pt returned to room and was left sitting up in w/c with all needs met, husband present, and chair alarm set.   Therapy Documentation Precautions:  Precautions Precautions: Fall Precaution Comments: h/o multiple falls, CHF and orthostatic hypotension Restrictions Weight Bearing Restrictions: No Vital Signs:  Pain: Pain Assessment Pain Scale: 0-10 Pain Score: 4  Pain Type: Acute pain Pain Location: Shoulder Pain Orientation: Left Pain Descriptors / Indicators: Aching Pain Frequency: Constant Pain Onset: Gradual Patients Stated Pain Goal: 2 Pain Intervention(s): Medication (See eMAR);Emotional support   Therapy/Group: Individual Therapy  Curtis Sites 03/20/2018, 12:09 PM

## 2018-03-20 NOTE — Progress Notes (Signed)
Occupational Therapy Session Note  Patient Details  Name: Theresa Ray MRN: [id number] Date of Birth: 06/04/1945  Today's Date: 03/20/2018 OT Individual Time: 1015-1058OT Individual Time Calculation (min): 43 min Short Term Goals: Week 2:  OT Short Term Goal 1 (Week 2): LTG=STG 2/2 ELOS  Skilled Therapeutic Interventions/Progress Updates:    1;1. Pt received in w/c with no c/o pain as just received tylenol from RN. Attempted to engage pt in egg carton puzzle arranging colored ping pong balls based on a pattern in a picture. Pt requires total fading to max VC to create half of puzzle and pt often attempting to put ping pong balls on 2D picture instead of in carton. Pt completes new sew blanket activity for attention, recall of novel task and FMC/BUE use. Pt requires max fading to mod question cues to recall to fasten 2 knots into blanket. Exited session with pt seated in bed, call light in reach and all needs met  Therapy Documentation Precautions:  Precautions Precautions: Fall Precaution Comments: h/o multiple falls, CHF and orthostatic hypotension Restrictions Weight Bearing Restrictions: No General:   Vital Signs:  Pain: Pain Assessment Pain Scale: 0-10 Pain Score: 4  Pain Type: Acute pain Pain Location: Shoulder Pain Orientation: Left Pain Descriptors / Indicators: Aching Pain Frequency: Constant Pain Onset: Gradual Patients Stated Pain Goal: 2 Pain Intervention(s): Medication (See eMAR);Emotional support ADL: ADL Eating: Supervision/safety Where Assessed-Eating: Chair Grooming: Moderate assistance Where Assessed-Grooming: Chair Upper Body Bathing: Minimal assistance Where Assessed-Upper Body Bathing: Shower Lower Body Bathing: Dependent Where Assessed-Lower Body Bathing: Shower Upper Body Dressing: Maximal assistance Where Assessed-Upper Body Dressing: Wheelchair Lower Body Dressing: Dependent Where Assessed-Lower Body Dressing: Wheelchair Toileting: Maximal  assistance Where Assessed-Toileting: Toilet Toilet Transfer: Minimal assistance Toilet Transfer Method: Ambulating Toilet Transfer Equipment: Grab bars Walk-In Shower Transfer: Maximal cueing, Minimal assistance Walk-In Shower Transfer Method: Ambulating Walk-In Shower Equipment: Transfer tub bench Vision   Perception    Praxis   Exercises:   Other Treatments:     Therapy/Group: Individual Therapy  Stephanie M Schlosser 03/20/2018, 11:00 AM 

## 2018-03-20 NOTE — Progress Notes (Signed)
[place] PHYSICAL MEDICINE & REHABILITATION PROGRESS NOTE   Subjective/Complaints: Patient seen sitting up in her chair working with therapies this morning.  She states she slept well overnight.  She notes frequent urination therapies, and family therapies.  ROS: +Urinary frequency.  Denies CP, SOB, N/V/D  Objective:   No results found. Recent Labs    03/17/18 2207  WBC 9.3  HGB 10.5*  HCT 32.2*  PLT 603*   Recent Labs    03/17/18 2207 03/19/18 0527  NA 132* 137  K 4.8 4.2  CL 97* 100  CO2 22 24  GLUCOSE 216* 161*  BUN 30* 18  CREATININE 1.53* 1.22*  CALCIUM 9.0 9.3    Intake/Output Summary (Last 24 hours) at 03/20/2018 0936 Last data filed at 03/20/2018 6213 Gross per 24 hour  Intake 265 ml  Output -  Net 265 ml     Physical Exam: Vital Signs Blood pressure (!) 150/67, pulse 78, temperature 98.3 F (36.8 C), temperature source Oral, resp. rate 16, height 5\' 3"  (1.6 m), weight 87.5 kg, SpO2 95 %.  Constitutional: No distress . Vital signs reviewed. HENT: Normocephalic.  Atraumatic. Eyes: EOMI. No discharge. Cardiovascular: RRR. No JVD. Respiratory: CTA Bilaterally. Normal effort. GI: BS +. Non-distended. Musc: No edema or tenderness in extremities. Neurological: alert and attentive. Intentional tremor RUE Follows simple commands.  Motor: LUE: 4-4+/5 proximal to distal LLE: HF, KE 4-4+/5, ADF 4+/5 RUE: 4/5 proximal to distal RLE: HF, KE  3+/5, ADF 3+/5 Skin: Skin is warm and dry.  Psychiatric: Flat.  Slowed.  Assessment/Plan: 1. Functional deficits secondary to right MCA and left MCA infarcts which require 3+ hours per day of interdisciplinary therapy in a comprehensive inpatient rehab setting.  Physiatrist is providing close team supervision and 24 hour management of active medical problems listed below.  Physiatrist and rehab team continue to assess barriers to discharge/monitor patient progress toward functional and medical goals  Care  Tool:  Bathing    Body parts bathed by patient: Right arm, Left arm, Chest, Abdomen, Face, Front perineal area, Right upper leg, Left upper leg, Right lower leg, Left lower leg   Body parts bathed by helper: Buttocks     Bathing assist Assist Level: Minimal Assistance - Patient > 75%(max cueing)     Upper Body Dressing/Undressing Upper body dressing   What is the patient wearing?: Pull over shirt    Upper body assist Assist Level: Minimal Assistance - Patient > 75%    Lower Body Dressing/Undressing Lower body dressing      What is the patient wearing?: Underwear/pull up     Lower body assist Assist for lower body dressing: Maximal Assistance - Patient 25 - 49%(d/t time constraints)     Toileting Toileting    Toileting assist Assist for toileting: Minimal Assistance - Patient > 75%     Transfers Chair/bed transfer  Transfers assist     Chair/bed transfer assist level: Minimal Assistance - Patient > 75%     Locomotion Ambulation   Ambulation assist      Assist level: Minimal Assistance - Patient > 75% Assistive device: Walker-rolling Max distance: 50   Walk 10 feet activity   Assist     Assist level: Minimal Assistance - Patient > 75% Assistive device: Walker-rolling   Walk 50 feet activity   Assist Walk 50 feet with 2 turns activity did not occur: Safety/medical concerns  Assist level: Minimal Assistance - Patient > 75% Assistive device: Walker-rolling    Walk 150  feet activity   Assist Walk 150 feet activity did not occur: Safety/medical concerns         Walk 10 feet on uneven surface  activity   Assist Walk 10 feet on uneven surfaces activity did not occur: Safety/medical concerns         Wheelchair     Assist Will patient use wheelchair at discharge?: No Type of Wheelchair: Manual    Wheelchair assist level: Minimal Assistance - Patient > 75% Max wheelchair distance: 30    Wheelchair 50 feet with 2 turns  activity    Assist        Assist Level: Dependent - Patient 0%   Wheelchair 150 feet activity     Assist     Assist Level: Dependent - Patient 0%    Medical Problem List and Plan: 1.  Left side weakness secondary to acute right MCA, MCA/PCA and left punctate MCA infarctions  Continue CIR  -loop recorder in place 2.  DVT Prophylaxis/Anticoagulation: SCDs. Monitor for any signs of DVT 3. Pain Management:  Tylenol as needed 4. Mood:  Celexa 10 mg daily. Provide emotional support 5. Neuropsych: This patient is capable of making decisions on her own behalf. 6. Skin/Wound Care:  Routine skin checks 7. Fluids/Electrolytes/Nutrition:  encourage PO liquids 8.  Left ureteral stone with hydronephrosis. Status post stenting.  [facility]  -monitor urine output/Cr  Continue to monitor  9. CAD/CABG. Continue aspirin and Plavix 10. Hypertension. Lisinopril 5 mg daily, Coreg 12.5 mg twice a day, Norvasc 10 mg daily.    Labile on 12/20 11. Diabetes mellitus.Hemoglobin A1c 6.0.SSI.  patient on Glucotrol 10 mg twice a day and Glucophage 1000 mg twice a day prior to admission.     -checking cbgs ac and hs  -resumed glucotrol, increased to 10mg  bid on 12/14  -resumed glucophage at 500mg  bid  Labile on 12/20 12. AKI. Push po  Creatinine 1.22 on 12/19  Continue to monitor 13. Fever with leukocytosis.resolved.     Labs ordered for Monday  14. Hyperlipidemia. Lipitor 15.  Hypothyroidism. 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

## 2018-03-21 ENCOUNTER — Inpatient Hospital Stay (HOSPITAL_COMMUNITY): Payer: Medicare HMO | Admitting: Occupational Therapy

## 2018-03-21 LAB — GLUCOSE, CAPILLARY
Glucose-Capillary: 146 mg/dL — ABNORMAL HIGH (ref 70–99)
Glucose-Capillary: 175 mg/dL — ABNORMAL HIGH (ref 70–99)
Glucose-Capillary: 94 mg/dL (ref 70–99)
Glucose-Capillary: 93 mg/dL (ref 70–99)

## 2018-03-21 NOTE — Plan of Care (Signed)
  Problem: RH SKIN INTEGRITY Goal: RH STG SKIN FREE OF INFECTION/BREAKDOWN Description Skin free of breakdown/infection with min assist  Outcome: Progressing Goal: RH STG MAINTAIN SKIN INTEGRITY WITH ASSISTANCE Description STG Maintain Skin Integrity With min Assistance.  Outcome: Progressing   Problem: RH SAFETY Goal: RH STG ADHERE TO SAFETY PRECAUTIONS W/ASSISTANCE/DEVICE Description STG Adhere to Safety Precautions With  Assistance/Device. min  Outcome: Progressing Goal: RH STG DECREASED RISK OF FALL WITH ASSISTANCE Description STG Decreased Risk of Fall With Assistance. min  Outcome: Progressing   Problem: RH PAIN MANAGEMENT Goal: RH STG PAIN MANAGED AT OR BELOW PT'S PAIN GOAL Description Pain at or below 3 with min assistance  Outcome: Progressing   Problem: RH BLADDER ELIMINATION Goal: RH STG MANAGE BLADDER WITH ASSISTANCE Description STG Manage Bladder With supervision Assistance  Outcome: Not Progressing Goal: RH STG MANAGE BLADDER WITH MEDICATION WITH ASSISTANCE Description STG Manage Bladder With Medication With min Assistance.  Outcome: Not Progressing Goal: RH STG MANAGE BLADDER WITH EQUIPMENT WITH ASSISTANCE Description STG Manage Bladder With Equipment With Assistance mod  Outcome: Not Progressing   Problem: RH COGNITION-NURSING Goal: RH STG USES MEMORY AIDS/STRATEGIES W/ASSIST TO PROBLEM SOLVE Description STG Uses Memory Aids/Strategies With Assistance to Problem Solve. mod  Outcome: Not Progressing Goal: RH STG ANTICIPATES NEEDS/CALLS FOR ASSIST W/ASSIST/CUES Description STG Anticipates Needs/Calls for Assist With Assistance/Cues. mod  Outcome: Not Progressing   Problem: RH KNOWLEDGE DEFICIT Goal: RH STG INCREASE KNOWLEDGE OF DIABETES Description Patient/family able to verbalize signs and symptoms of hypo/hyperglecemia with min assistance   Outcome: Not Progressing Goal: RH STG INCREASE KNOWLEDGE OF HYPERTENSION Description Patient able to  verbalize sign and symptoms of hypo/hypertension with min assistance  Outcome: Not Progressing Goal: RH STG INCREASE KNOWLEDGE OF DYSPHAGIA/FLUID INTAKE Description Patient/family able to verbalize approved foods for  dysphagia 3 with min assistance  Outcome: Not Progressing Goal: RH STG INCREASE KNOWLEDGE OF STROKE PROPHYLAXIS Description Patient/family able to verbalize sign and symptom of a stroke as well as prevent strategies with min assistance  Outcome: Not Progressing

## 2018-03-21 NOTE — Progress Notes (Signed)
Occupational Therapy Session Note  Patient Details  Name: Theresa Ray MRN: [id number] Date of Birth: 1945/11/20  Today's Date: 03/21/2018 OT Individual Time: 5277-8242 OT Individual Time Calculation (min): 28 min    Short Term Goals: Week 2:  OT Short Term Goal 1 (Week 2): LTG=STG 2/2 ELOS  Skilled Therapeutic Interventions/Progress Updates:    Upon entering the room, pt in bed with breakfast tray in front of her. Pt refusing to exit bed or sit EOB this session to eat meal. OT repositioned pt for safety with min A. OT reviewed calendar for orientation as well as schedule with pt needing max multimodal cuing to answer correctly. Pt needing min cuing to visually scan and locate items on L side of room. Pt returning to eating breakfast at end of session with bed alarm activated and call bell within reach.   Therapy Documentation Precautions:  Precautions Precautions: Fall Precaution Comments: h/o multiple falls, CHF and orthostatic hypotension Restrictions Weight Bearing Restrictions: No Vital Signs: Therapy Vitals Temp: 99.2 F (37.3 C) Pulse Rate: 72 Resp: 18 BP: (!) 149/71 Patient Position (if appropriate): Lying Oxygen Therapy SpO2: 97 % O2 Device: Room Air Pain: Pain Assessment Pain Score: Asleep ADL: ADL Eating: Supervision/safety Where Assessed-Eating: Chair Grooming: Moderate assistance Where Assessed-Grooming: Chair Upper Body Bathing: Minimal assistance Where Assessed-Upper Body Bathing: Shower Lower Body Bathing: Dependent Where Assessed-Lower Body Bathing: Shower Upper Body Dressing: Maximal assistance Where Assessed-Upper Body Dressing: Wheelchair Lower Body Dressing: Dependent Where Assessed-Lower Body Dressing: Wheelchair Toileting: Maximal assistance Where Assessed-Toileting: Glass blower/designer: Psychiatric nurse Method: Counselling psychologist: Energy manager: Maximal cueing, Minimal  assistance Social research officer, government Method: Heritage manager: Radio broadcast assistant   Therapy/Group: Individual Therapy  Gypsy Decant 03/21/2018, 8:09 AM

## 2018-03-21 NOTE — Progress Notes (Signed)
Theresa Ray is a 72 y.o. female 07-Sep-1945 [id number]  Subjective: No new complaints. No new problems. Slept well. Feeling OK.  Objective: Vital signs in last 24 hours: Temp:  [97.9 F (36.6 C)-99.2 F (37.3 C)] 97.9 F (36.6 C) (12/21 1252) Pulse Rate:  [67-72] 67 (12/21 1252) Resp:  [16-20] 20 (12/21 1252) BP: (133-149)/(59-71) 136/59 (12/21 1252) SpO2:  [95 %-98 %] 98 % (12/21 1252) Weight change:  Last BM Date: 03/19/18  Intake/Output from previous day: 12/20 0701 - 12/21 0700 In: 240 [P.O.:240] Out: -  Last cbgs: CBG (last 3)  Recent Labs    03/20/18 2159 03/21/18 0731 03/21/18 1130  GLUCAP 95 146* 175*     Physical Exam General: No apparent distress.  The patient is trying to work on her breakfast.  She is obese. HEENT: not dry Lungs: Normal effort. Lungs clear to auscultation, no crackles or wheezes. Cardiovascular: Regular rate and rhythm, no edema Abdomen: S/NT/ND; BS(+) Musculoskeletal:  unchanged Neurological: No new neurological deficits Wounds: N/A    Skin: clear  Aging changes Mental state: Alert, oriented, cooperative    Lab Results: BMET    Component Value Date/Time   NA 137 03/19/2018 0527   NA 139 03/28/2017 1645   K 4.2 03/19/2018 0527   CL 100 03/19/2018 0527   CO2 24 03/19/2018 0527   GLUCOSE 161 (H) 03/19/2018 0527   BUN 18 03/19/2018 0527   BUN 16 03/28/2017 1645   CREATININE 1.22 (H) 03/19/2018 0527   CALCIUM 9.3 03/19/2018 0527   GFRNONAA 45 (L) 03/19/2018 0527   GFRAA 52 (L) 03/19/2018 0527   CBC    Component Value Date/Time   WBC 9.3 03/17/2018 2207   RBC 3.50 (L) 03/17/2018 2207   HGB 10.5 (L) 03/17/2018 2207   HGB 13.1 03/28/2017 1645   HCT 32.2 (L) 03/17/2018 2207   HCT 39.6 03/28/2017 1645   PLT 603 (H) 03/17/2018 2207   PLT 486 (H) 03/28/2017 1645   MCV 92.0 03/17/2018 2207   MCV 90 03/28/2017 1645   MCH 30.0 03/17/2018 2207   MCHC 32.6 03/17/2018 2207   RDW 12.1 03/17/2018 2207   RDW 14.0 03/28/2017  1645   LYMPHSABS 2.6 03/17/2018 2207   MONOABS 0.9 03/17/2018 2207   EOSABS 0.2 03/17/2018 2207   BASOSABS 0.1 03/17/2018 2207    Studies/Results: No results found.  Medications: I have reviewed the patient's current medications.  Assessment/Plan:  1.  Acute right MCA, MCA/PCA and left punctuate MCA infarctions.  Left-sided weakness.  Continue with CIR. 2.  DVT prophylaxis with SCD. 3.  Pain management with PRN Tylenol 4.  Depression.  Continue with Celexa 10 mg daily.  Emotional support 5.  Left ureteral stone with hydronephrosis.  Status post stenting.  Monitor urinary output.  No pain. 6.  Coronary artery disease/bypass surgery - continue with aspirin and Plavix 7.  Hypertension.  On lisinopril, Coreg, Norvasc 8.  Type 2 diabetes.  Continue with Glucotrol and metformin.  Sliding scale insulin 9.  Acute renal insufficiency.  Monitor creatinine 10.  Fever with leukocytosis, resolved 11.  Dyslipidemia-  continue with Lipitor 12.  Hypothyroidism.  Continue with Synthroid    Length of stay, days: 12  Walker Kehr , MD 03/21/2018, 3:31 PM

## 2018-03-22 ENCOUNTER — Inpatient Hospital Stay (HOSPITAL_COMMUNITY): Payer: Medicare HMO | Admitting: Occupational Therapy

## 2018-03-22 DIAGNOSIS — I69354 Hemiplegia and hemiparesis following cerebral infarction affecting left non-dominant side: Secondary | ICD-10-CM | POA: Diagnosis not present

## 2018-03-22 DIAGNOSIS — I110 Hypertensive heart disease with heart failure: Secondary | ICD-10-CM | POA: Diagnosis not present

## 2018-03-22 LAB — GLUCOSE, CAPILLARY
Glucose-Capillary: 103 mg/dL — ABNORMAL HIGH (ref 70–99)
Glucose-Capillary: 176 mg/dL — ABNORMAL HIGH (ref 70–99)
Glucose-Capillary: 101 mg/dL — ABNORMAL HIGH (ref 70–99)
Glucose-Capillary: 159 mg/dL — ABNORMAL HIGH (ref 70–99)

## 2018-03-22 NOTE — Plan of Care (Signed)
  Problem: RH BLADDER ELIMINATION Goal: RH STG MANAGE BLADDER WITH ASSISTANCE Description STG Manage Bladder With supervision Assistance  Outcome: Progressing Goal: RH STG MANAGE BLADDER WITH MEDICATION WITH ASSISTANCE Description STG Manage Bladder With Medication With min Assistance.  Outcome: Progressing Goal: RH STG MANAGE BLADDER WITH EQUIPMENT WITH ASSISTANCE Description STG Manage Bladder With Equipment With Assistance mod  Outcome: Progressing   Problem: RH SKIN INTEGRITY Goal: RH STG SKIN FREE OF INFECTION/BREAKDOWN Description Skin free of breakdown/infection with min assist  Outcome: Progressing Goal: RH STG MAINTAIN SKIN INTEGRITY WITH ASSISTANCE Description STG Maintain Skin Integrity With min Assistance.  Outcome: Progressing   Problem: RH SAFETY Goal: RH STG ADHERE TO SAFETY PRECAUTIONS W/ASSISTANCE/DEVICE Description STG Adhere to Safety Precautions With  Assistance/Device. min  Outcome: Progressing Goal: RH STG DECREASED RISK OF FALL WITH ASSISTANCE Description STG Decreased Risk of Fall With Assistance. min  Outcome: Progressing   Problem: RH COGNITION-NURSING Goal: RH STG USES MEMORY AIDS/STRATEGIES W/ASSIST TO PROBLEM SOLVE Description STG Uses Memory Aids/Strategies With Assistance to Problem Solve. mod  Outcome: Progressing Goal: RH STG ANTICIPATES NEEDS/CALLS FOR ASSIST W/ASSIST/CUES Description STG Anticipates Needs/Calls for Assist With Assistance/Cues. mod  Outcome: Progressing   Problem: RH PAIN MANAGEMENT Goal: RH STG PAIN MANAGED AT OR BELOW PT'S PAIN GOAL Description Pain at or below 3 with min assistance  Outcome: Progressing   Problem: RH KNOWLEDGE DEFICIT Goal: RH STG INCREASE KNOWLEDGE OF DIABETES Description Patient/family able to verbalize signs and symptoms of hypo/hyperglecemia with min assistance   Outcome: Progressing Goal: RH STG INCREASE KNOWLEDGE OF HYPERTENSION Description Patient able to verbalize sign and symptoms  of hypo/hypertension with min assistance  Outcome: Progressing Goal: RH STG INCREASE KNOWLEDGE OF DYSPHAGIA/FLUID INTAKE Description Patient/family able to verbalize approved foods for  dysphagia 3 with min assistance  Outcome: Progressing Goal: RH STG INCREASE KNOWLEDGE OF STROKE PROPHYLAXIS Description Patient/family able to verbalize sign and symptom of a stroke as well as prevent strategies with min assistance  Outcome: Progressing

## 2018-03-22 NOTE — Progress Notes (Addendum)
Occupational Therapy Session Note  Patient Details  Name: Theresa Ray MRN: [id number] Date of Birth: 07/15/1945  Today's Date: 03/22/2018 OT Group Time: 1101-1201 OT Group Time Calculation (min): 60 min  Skilled Therapeutic Interventions/Progress Updates:    Pt engaged in therapeutic w/c level dance group focusing on patient choice, UE/LE strengthening, salience, activity tolerance, and social participation. Pt was guided through various dance-based exercises involving UEs/LEs and trunk. All music was selected by group members. Emphasis placed on Lt NMR, motor planning, sustained attention, and Lt attention. Pt required vcs for following dance sequences and often stopped to people-watch. Neighbors and Lt and Rt side facilitated hand-holding for social engagement. She requested 1 song for the group and told everyone her birthday was on [birth year]. We sang happy birthday for her, which appeared to enhance affect. At end of session pt was taken back to room and left with spouse.   Pt declined standing when given opportunities during group Therapy Documentation Precautions:  Precautions Precautions: Fall Precaution Comments: h/o multiple falls, CHF and orthostatic hypotension Restrictions Weight Bearing Restrictions: No Pain: No s/s pain during session    ADL: ADL Eating: Supervision/safety Where Assessed-Eating: Chair Grooming: Moderate assistance Where Assessed-Grooming: Chair Upper Body Bathing: Minimal assistance Where Assessed-Upper Body Bathing: Shower Lower Body Bathing: Dependent Where Assessed-Lower Body Bathing: Shower Upper Body Dressing: Maximal assistance Where Assessed-Upper Body Dressing: Wheelchair Lower Body Dressing: Dependent Where Assessed-Lower Body Dressing: Wheelchair Toileting: Maximal assistance Where Assessed-Toileting: Glass blower/designer: Psychiatric nurse Method: Counselling psychologist: Statistician: Maximal cueing, Environmental education officer Method: Wellsite geologist Equipment: Radio broadcast assistant      Therapy/Group: Group Therapy  Skeet Simmer 03/22/2018, 12:48 PM

## 2018-03-22 NOTE — Progress Notes (Signed)
Theresa Ray is a 72 y.o. female 08-Mar-1946 [id number]  Subjective: No new complaints. No new problems. Slept well. Feeling OK.  Objective: Vital signs in last 24 hours: Temp:  [97.9 F (36.6 C)-98.6 F (37 C)] 98.3 F (36.8 C) (12/22 0521) Pulse Rate:  [64-85] 85 (12/22 0523) Resp:  [14-20] 14 (12/22 0521) BP: (123-146)/(49-78) 141/78 (12/22 0523) SpO2:  [95 %-98 %] 98 % (12/22 0523) Weight:  [84.6 kg] 84.6 kg (12/22 0523) Weight change:  Last BM Date: 03/21/18  Intake/Output from previous day: 12/21 0701 - 12/22 0700 In: 71 [P.O.:780] Out: -  Last cbgs: CBG (last 3)  Recent Labs    03/21/18 1643 03/21/18 2150 03/22/18 0633  GLUCAP 94 93 103*     Physical Exam General: No apparent distress.  Eating breakfast HEENT: not dry Lungs: Normal effort. Lungs clear to auscultation, no crackles or wheezes. Cardiovascular: Regular rate and rhythm, no edema Abdomen: S/NT/ND; BS(+) Musculoskeletal:  unchanged Neurological: No new neurological deficits Wounds: N/A    Skin: clear  Aging changes Mental state: Alert, oriented, cooperative    Lab Results: BMET    Component Value Date/Time   NA 137 03/19/2018 0527   NA 139 03/28/2017 1645   K 4.2 03/19/2018 0527   CL 100 03/19/2018 0527   CO2 24 03/19/2018 0527   GLUCOSE 161 (H) 03/19/2018 0527   BUN 18 03/19/2018 0527   BUN 16 03/28/2017 1645   CREATININE 1.22 (H) 03/19/2018 0527   CALCIUM 9.3 03/19/2018 0527   GFRNONAA 45 (L) 03/19/2018 0527   GFRAA 52 (L) 03/19/2018 0527   CBC    Component Value Date/Time   WBC 9.3 03/17/2018 2207   RBC 3.50 (L) 03/17/2018 2207   HGB 10.5 (L) 03/17/2018 2207   HGB 13.1 03/28/2017 1645   HCT 32.2 (L) 03/17/2018 2207   HCT 39.6 03/28/2017 1645   PLT 603 (H) 03/17/2018 2207   PLT 486 (H) 03/28/2017 1645   MCV 92.0 03/17/2018 2207   MCV 90 03/28/2017 1645   MCH 30.0 03/17/2018 2207   MCHC 32.6 03/17/2018 2207   RDW 12.1 03/17/2018 2207   RDW 14.0 03/28/2017 1645   LYMPHSABS 2.6 03/17/2018 2207   MONOABS 0.9 03/17/2018 2207   EOSABS 0.2 03/17/2018 2207   BASOSABS 0.1 03/17/2018 2207    Studies/Results: No results found.  Medications: I have reviewed the patient's current medications.  Assessment/Plan:   1.  Acute right MCA, MCA/PCA and left punctuate MCA infarctions.  Left-sided weakness.  Continue with CIR I spoke with the Mrs. Njie's husband Mallie Mussel.  They are getting ready for her discharge hopefully on Tuesday. 2.  DVT prophylaxis with SCD 3.  Pain management with PRN Tylenol 4.  Depression.  Celexa 10 mg daily.  Emotional support 5.  Left ureteral stone with hydronephrosis.  Status post stenting.  Monitor urinary output.  No renal colic symptoms 6.  Coronary artery disease/status post bypass surgery-continue with aspirin and Plavix 7.  Hypertension.  On lisinopril, Coreg, Norvasc 8.  Type 2 diabetes.  Continue with Glucotrol and metformin. 9.  Acute renal insufficiency.  Will monitor bemet as an outpatient.  Good hydration 10.  Fever with leukocytosis, resolved 11.  Dyslipidemia.  Continue with Lipitor Hypothyroidism continue with Synthroid     Length of stay, days: 13  Walker Kehr , MD 03/22/2018, 11:55 AM

## 2018-03-23 ENCOUNTER — Inpatient Hospital Stay (HOSPITAL_COMMUNITY): Payer: Medicare HMO

## 2018-03-23 ENCOUNTER — Inpatient Hospital Stay (HOSPITAL_COMMUNITY): Payer: Medicare HMO | Admitting: Speech Pathology

## 2018-03-23 ENCOUNTER — Inpatient Hospital Stay (HOSPITAL_COMMUNITY): Payer: Medicare HMO | Admitting: Physical Therapy

## 2018-03-23 DIAGNOSIS — D62 Acute posthemorrhagic anemia: Secondary | ICD-10-CM

## 2018-03-23 DIAGNOSIS — D72829 Elevated white blood cell count, unspecified: Secondary | ICD-10-CM

## 2018-03-23 LAB — CBC WITH DIFFERENTIAL/PLATELET
ABS IMMATURE GRANULOCYTES: 0.04 10*3/uL (ref 0.00–0.07)
Basophils Absolute: 0.1 10*3/uL (ref 0.0–0.1)
Basophils Relative: 1 %
Eosinophils Absolute: 0.2 10*3/uL (ref 0.0–0.5)
Eosinophils Relative: 2 %
HCT: 31.8 % — ABNORMAL LOW (ref 36.0–46.0)
Hemoglobin: 10.2 g/dL — ABNORMAL LOW (ref 12.0–15.0)
Immature Granulocytes: 0 %
LYMPHS ABS: 1.7 10*3/uL (ref 0.7–4.0)
Lymphocytes Relative: 16 %
MCH: 29.7 pg (ref 26.0–34.0)
MCHC: 32.1 g/dL (ref 30.0–36.0)
MCV: 92.4 fL (ref 80.0–100.0)
MONOS PCT: 8 %
Monocytes Absolute: 0.8 10*3/uL (ref 0.1–1.0)
Neutro Abs: 8.1 10*3/uL — ABNORMAL HIGH (ref 1.7–7.7)
Neutrophils Relative %: 73 %
Platelets: 471 10*3/uL — ABNORMAL HIGH (ref 150–400)
RBC: 3.44 MIL/uL — ABNORMAL LOW (ref 3.87–5.11)
RDW: 11.9 % (ref 11.5–15.5)
WBC: 11 10*3/uL — ABNORMAL HIGH (ref 4.0–10.5)
nRBC: 0 % (ref 0.0–0.2)

## 2018-03-23 LAB — GLUCOSE, CAPILLARY
GLUCOSE-CAPILLARY: 75 mg/dL (ref 70–99)
Glucose-Capillary: 93 mg/dL (ref 70–99)
Glucose-Capillary: 131 mg/dL — ABNORMAL HIGH (ref 70–99)
Glucose-Capillary: 80 mg/dL (ref 70–99)

## 2018-03-23 MED ORDER — CITALOPRAM HYDROBROMIDE 10 MG PO TABS: 10.0000 mg | ORAL_TABLET | Freq: Every day | ORAL | 0 refills | Status: DC

## 2018-03-23 MED ORDER — LISINOPRIL 5 MG PO TABS: 5.0000 mg | ORAL_TABLET | Freq: Every day | ORAL | 3 refills | Status: AC

## 2018-03-23 MED ORDER — ATORVASTATIN CALCIUM 40 MG PO TABS: 40.0000 mg | ORAL_TABLET | Freq: Every day | ORAL | 3 refills | Status: DC

## 2018-03-23 MED ORDER — METFORMIN HCL 500 MG PO TABS: 500.0000 mg | ORAL_TABLET | Freq: Two times a day (BID) | ORAL | 1 refills | Status: DC

## 2018-03-23 MED ORDER — LEVOTHYROXINE SODIUM 175 MCG PO TABS: 175.0000 ug | ORAL_TABLET | Freq: Every day | ORAL | 1 refills | Status: DC

## 2018-03-23 MED ORDER — CARVEDILOL 12.5 MG PO TABS: 12.5000 mg | ORAL_TABLET | Freq: Two times a day (BID) | ORAL | 11 refills | Status: AC

## 2018-03-23 MED ORDER — GLIPIZIDE 5 MG PO TABS: 5.0000 mg | ORAL_TABLET | Freq: Two times a day (BID) | ORAL | 1 refills | Status: DC

## 2018-03-23 MED ORDER — PANTOPRAZOLE SODIUM 40 MG PO TBEC: 40.0000 mg | DELAYED_RELEASE_TABLET | Freq: Every day | ORAL | 0 refills | Status: DC

## 2018-03-23 MED ORDER — AMLODIPINE BESYLATE 10 MG PO TABS: 10.0000 mg | ORAL_TABLET | Freq: Every day | ORAL | 0 refills | Status: DC

## 2018-03-23 MED ORDER — CLOPIDOGREL BISULFATE 75 MG PO TABS: 75.0000 mg | ORAL_TABLET | Freq: Every day | ORAL | 3 refills | Status: DC

## 2018-03-23 NOTE — Discharge Summary (Signed)
NAME: Theresa Ray, Theresa Ray MEDICAL RECORD [id number] ACCOUNT [id number] DATE OF BIRTH:[birth year] FACILITY: [facility] LOCATION: [id number] PHYSICIAN:ZACHARY Naaman Plummer, MD  DISCHARGE SUMMARY  DATE OF DISCHARGE:  03/24/2018  DISCHARGE DIAGNOSES: 1.  Right middle cerebral artery, posterior cerebral artery infarction status post loop recorder. 2.  sequential compression devices  for deep venous thrombosis  prophylaxis. 3.  Pain management.   4.  Left ureteral stone with hydronephrosis.   5.  Coronary artery disease with coronary artery bypass grafting.   6.  Hypertension.   7.  Diabetes mellitus.  8.  Kidney injury. 9.  Hyperlipidemia. 10.  Hypothyroidism.  HOSPITAL COURSE:  This is a 72 year old right-handed female with history of CAD with CABG maintained on aspirin and Plavix; diastolic congestive heart failure, diabetes mellitus.  Lives with her husband, independent prior to admission.  Presented  03/02/2018 with altered mental status, left-sided weakness to [place] showed no large vessel occlusion.  The patient did not receive tPA.  Noted leukocytosis as well as creatinine 1.87.  CT of abdomen completed showing left ureteric  obstruction secondary to kidney stone.  Taken to the OR for a double-J stent per urology services.  Transferred to [facility] for further evaluation of left-sided weakness.  MRI showed bilateral infarctions.  Followup CT imaging at [facility]  showed multifocal acute bilateral small infarcts spanning multiple vascular territories, moderate to severe chronic small vessel ischemic changes, old basal ganglia and right cerebellar infarctions.   Echocardiogram with ejection fraction of 55%.  Systolic function normal.  TEE showed no source of emboli.  A loop recorder was placed.  03/06/2018.    Follow up urology service after recent placement of stent for kidney stone.  The patient to follow up at [facility].  She remained on aspirin and Plavix for CVA  prophylaxis and patient was admitted for a comprehensive rehabilitation program.  PAST MEDICAL HISTORY:  See discharge diagnoses.  SOCIAL HISTORY:  Lives with husband, independent prior to admission.  FUNCTIONAL STATUS:  Upon admission to rehab services, minimal guard 40 feet rolling walker, moderate assist sit to stand, mod/max assist with activities of daily living.  PHYSICAL EXAMINATION: VITAL SIGNS:  Blood pressure 140/62, pulse 62, temperature 98, respirations 16. GENERAL:  Alert female in no acute distress, somewhat distracted.  Follow commands. CARDIOVASCULAR:  Rate controlled. ABDOMEN:  Soft, nontender, good bowel sounds. LUNGS:  Clear to auscultation without wheeze.  REHABILITATION HOSPITAL COURSE:  The patient was admitted to inpatient rehabilitation services.  Therapies initiated on a 3-hour daily basis, consisting of physical therapy, occupational therapy, speech therapy and rehabilitation nursing.  The following  issues were addressed during patient's rehabilitation stay:   Pertaining to the patient's MCA PCA infarction, remained stable, maintained on aspirin and Plavix therapy.  SCDs for DVT prophylaxis.    In regards to left ureteral stone with hydronephrosis, she would follow up urology services as outpatient.  Noted stent did displace during her hospital stay at [facility].  Urology followup.  She had no other further complaints.  KUB  unremarkable.  Again, follow up urology services.    Blood pressure is controlled with current regimen.    Blood sugars, Hemoglobin A1c of  6.  Glucotrol as well as Glucophage and ongoing diabetic teaching.  Hyperlipidemia with Lipitor.    Hormone supplement with hypothyroidism with Synthroid.    The patient received weekly collaborative interdisciplinary team conferences to discuss estimated length of stay, family teaching, any barriers to discharge.  Minimal assist  for sit to stand, ambulating rolling walker throughout the  room.  Min mod assist  for safety.  Gathered belongings for activities of daily living and homemaking.  She was able to communicate her needs.    She is tolerating a regular consistency diet.  The patient can recall assessments as advised.  However, ongoing supervision was recommended.  She demonstrated some lack of insight into the nature of her current deficits.  Full family teaching completed.  DISCHARGE MEDICATIONS:  Included Norvasc 10 mg p.o. daily, aspirin 81 mg p.o. daily, Lipitor 40 mg p.o. daily, Coreg 12.5 mg p.o. b.i.d., Celexa 10 mg p.o. daily, Plavix 75 mg p.o. daily, Glucotrol 5 mg p.o. b.i.d., Synthroid 175 mcg p.o. daily,  lisinopril 5 mg p.o. daily, Glucophage 500 mg p.o. b.i.d., multivitamin daily, Protonix 40 mg p.o. daily.  DIET:  Diabetic diet.    FOLLOWUP:  She would follow up with Dr. Alger Simons at the outpatient rehab service office as advised; Dr. Erlinda Hong, neurology service, call for appointment; Dr. Angelena Form.  Dr. Emi Holes, urology services, [place]; Dr. Kennith Maes,  medical management.  AN/NUANCE D:03/23/2018 T:03/23/2018 JOB:[id number]

## 2018-03-23 NOTE — Discharge Summary (Signed)
Discharge summary job [id number]

## 2018-03-23 NOTE — Progress Notes (Signed)
Speech Language Pathology Discharge Summary  Patient Details  Name: Theresa Ray MRN: [id number] Date of Birth: [birth year]  Today's Date: 03/23/2018 SLP Individual Time: 0830-0915 SLP Individual Time Calculation (min): 45 min   Skilled Therapeutic Interventions:  Skilled treatment session focused on cognition goals and education focused on safe discharge planning. SLP received pt in bed with partial body in bed and partial hanging off of bed. Pt unable to explain what she was doing d/t confusion (she stated she just finished supper - it was after breakfast). Handout given on compensatory memory strategies and calendar reviewed. Pt left upright in bed, bed alarm on and all needs within reach. Continue per current plan of care.   Patient has met 4 of 4 long term goals.  Patient to discharge at overall Min;Mod level.    Clinical Impression/Discharge Summary:   Pt has made some progress during skilled St and as a result she is discharging at [place] A that can fluctuate to Mod A depending on confusion and fatigue. Pt's husband has been present for majority of sessions and has viewed deficits during functional tasks. Recommend HHST to target safety, awareness and functional problem solving within tasks at home as well and left inattention and overall selective attention.  Care Partner:  Caregiver Able to Provide Assistance: Yes  Type of Caregiver Assistance: Physical;Cognitive  Recommendation:  Home Health SLP;24 hour supervision/assistance  Rationale for SLP Follow Up: Maximize cognitive function and independence;Maximize functional communication;Reduce caregiver burden   Equipment:     Reasons for discharge: Treatment goals met;Discharged from hospital   Patient/Family Agrees with Progress Made and Goals Achieved: Yes    Tonnette Zwiebel 03/23/2018, 9:41 AM

## 2018-03-23 NOTE — Progress Notes (Signed)
Physical Therapy Discharge Summary  Patient Details  Name: Theresa Ray MRN: [id number] Date of Birth: [birth year]  Today's Date: 03/23/2018 PT Individual Time: 1100-1153 PT Individual Time Calculation (min): 53 min   Pt in bed, agreeable to therapy.  Pt performs bed mobility and transfers with RW and increased time, min cuing.  Gait 50' x 2, 100' with supervision and RW.  Simulated car transfer with supervision, increased time and mod cuing for safety and initiation.  Ramp and curb negotiation with min with RW due to decreased eccentric control.  Stair negotiation x 12 stairs with bilat handrails with min A for descending, supervision for ascending.  nustep x 8 minutes for continued strength and endurance training.  Pt left in room with alarm set, needs at hand.   Patient has met 6 of 8 long term goals due to improved activity tolerance, improved balance, improved postural control, increased strength, ability to compensate for deficits and improved attention.  Patient to discharge at an ambulatory level Supervision.   Patient's care partner not present to participate in family education. Pt's partner has been a caretaker for her in the past.  Reasons goals not met: continues with deficits in attention, gait distance limited by fatigue  Recommendation:  Patient will benefit from ongoing skilled PT services in home health setting to continue to advance safe functional mobility, address ongoing impairments in gait, balance, strength, activity tolerance, and minimize fall risk.  Equipment: RW, w/c  Reasons for discharge: treatment goals met and discharge from hospital  Patient/family agrees with progress made and goals achieved: Yes  PT Discharge Precautions/Restrictions Precautions Precautions: Fall Precaution Comments: h/o multiple falls, CHF and orthostatic hypotension Restrictions Weight Bearing Restrictions: No Pain No c/o pain during session Vision/Perception  Vision -  Assessment Eye Alignment: Within Functional Limits Ocular Range of Motion: Within Functional Limits Alignment/Gaze Preference: Within Defined Limits Tracking/Visual Pursuits: Requires cues, head turns, or add eye shifts to track Saccades: Decreased speed of saccadic movement Convergence: Within functional limits Additional Comments: Lt inattention remains Perception Perception: Impaired Spatial Orientation: mild L inattention Praxis Praxis: Intact  Cognition Overall Cognitive Status: Impaired/Different from baseline Arousal/Alertness: Awake/alert Orientation Level: Oriented X4 Attention: Selective Selective Attention: Impaired Selective Attention Impairment: Verbal basic;Functional basic Memory: Impaired Memory Impairment: Decreased recall of new information;Decreased short term memory Decreased Short Term Memory: Verbal basic;Functional basic Awareness: Impaired Awareness Impairment: Intellectual impairment Problem Solving: Impaired Problem Solving Impairment: Verbal basic;Functional basic Executive Function: (All areas impacted by lower level deficits) Safety/Judgment: Impaired Comments: left inattention, poor task initiation, decreased recall of deficits Sensation Sensation Light Touch: Appears Intact Hot/Cold: Appears Intact Proprioception: Appears Intact Coordination Gross Motor Movements are Fluid and Coordinated: Yes Fine Motor Movements are Fluid and Coordinated: Yes Coordination and Movement Description: delayed, slow processing Finger Nose Finger Test: accurate but slow rate Motor  Motor Motor: Other (comment) Motor - Discharge Observations: slow processing, generalized weakness  Mobility Bed Mobility Bed Mobility: Rolling Right;Supine to Sit Rolling Right: Other (comment) Supine to Sit: Independent with assistive device Transfers Transfers: Sit to Stand;Stand Pivot Transfers Sit to Stand: Supervision/Verbal cueing Stand Pivot Transfers:  Supervision/Verbal cueing Stand Pivot Transfer Details: Verbal cues for sequencing;Verbal cues for precautions/safety;Verbal cues for safe use of DME/AE Transfer (Assistive device): Rolling walker Locomotion  Gait Ambulation: Yes Gait Assistance: Supervision/Verbal cueing Gait Distance (Feet): 100 Feet Assistive device: Rolling walker Stairs / Additional Locomotion Stairs: Yes Stairs Assistance: Minimal Assistance - Patient > 75% Stair Management Technique: Two rails;Step to pattern;Alternating pattern Number of  Stairs: 12 Ramp: Minimal Assistance - Patient >75% Wheelchair Mobility Wheelchair Mobility: No  Trunk/Postural Assessment  Cervical Assessment Cervical Assessment: (fwd head) Thoracic Assessment Thoracic Assessment: (kyphotic) Lumbar Assessment Lumbar Assessment: (posterior pelvic tilt) Postural Control Postural Control: Deficits on evaluation Righting Reactions: delayed Protective Responses: delayed  Balance Balance Balance Assessed: Yes Static Sitting Balance Static Sitting - Balance Support: Feet supported Static Sitting - Level of Assistance: 6: Modified independent (Device/Increase time) Dynamic Sitting Balance Dynamic Sitting - Balance Support: Feet supported Dynamic Sitting - Level of Assistance: 5: Stand by assistance Dynamic Sitting - Balance Activities: Reaching for weighted objects Static Standing Balance Static Standing - Balance Support: During functional activity;Bilateral upper extremity supported Static Standing - Level of Assistance: 5: Stand by assistance Dynamic Standing Balance Dynamic Standing - Balance Support: During functional activity;Bilateral upper extremity supported Dynamic Standing - Level of Assistance: 5: Stand by assistance Extremity Assessment  RUE Assessment RUE Assessment: Within Functional Limits Active Range of Motion (AROM) Comments: WFL General Strength Comments: 4-/5 LUE Assessment LUE Assessment: Within Functional  Limits Active Range of Motion (AROM) Comments: WFLs General Strength Comments: 4-/5 t/o RLE Assessment General Strength Comments: grossly 4/5 LLE Assessment General Strength Comments: grossly 4-/5    Biruk Troia 03/23/2018, 11:40 AM

## 2018-03-23 NOTE — Progress Notes (Signed)
[facility] PHYSICAL MEDICINE & REHABILITATION PROGRESS NOTE   Subjective/Complaints: Patient seen laying in bed this morning.  She states she slept well overnight.  She states she had a good weekend.  She thought she was going to be discharged today, but is okay with being discharged tomorrow.  ROS: Denies CP, SOB, N/V/D  Objective:   No results found. Recent Labs    03/23/18 0926  WBC 11.0*  HGB 10.2*  HCT 31.8*  PLT 471*   No results for input(s): NA, K, CL, CO2, GLUCOSE, BUN, CREATININE, CALCIUM in the last 72 hours.  Intake/Output Summary (Last 24 hours) at 03/23/2018 1113 Last data filed at 03/23/2018 0900 Gross per 24 hour  Intake 360 ml  Output -  Net 360 ml     Physical Exam: Vital Signs Blood pressure 133/62, pulse 77, temperature 98.5 F (36.9 C), temperature source Oral, resp. rate 15, height 5\' 3"  (1.6 m), weight 83.9 kg, SpO2 96 %.  Constitutional: No distress . Vital signs reviewed. HENT: Normocephalic.  Atraumatic. Eyes: EOMI. No discharge. Cardiovascular: RRR.  No JVD. Respiratory: CTA bilaterally.  Normal effort. GI: BS +. Non-distended. Musc: No edema or tenderness in extremities. Neurological: alert and attentive. Intentional tremor RUE, stable Follows simple commands.  Motor: LUE: 4-4+/5 proximal to distal LLE: HF, KE 4-4+/5, ADF 4+/5, stable RUE: 4/5 proximal to distal, stable RLE: HF, KE  3+/5, ADF 3+/5 Skin: Skin is warm and dry.  Psychiatric: Flat.  Slowed.  Assessment/Plan: 1. Functional deficits secondary to right MCA and left MCA infarcts which require 3+ hours per day of interdisciplinary therapy in a comprehensive inpatient rehab setting.  Physiatrist is providing close team supervision and 24 hour management of active medical problems listed below.  Physiatrist and rehab team continue to assess barriers to discharge/monitor patient progress toward functional and medical goals  Care Tool:  Bathing    Body parts bathed by  patient: Right arm, Left arm, Chest, Abdomen, Face, Front perineal area, Right upper leg, Left upper leg, Right lower leg, Left lower leg, Buttocks   Body parts bathed by helper: Buttocks     Bathing assist Assist Level: Supervision/Verbal cueing     Upper Body Dressing/Undressing Upper body dressing   What is the patient wearing?: Pull over shirt    Upper body assist Assist Level: Supervision/Verbal cueing    Lower Body Dressing/Undressing Lower body dressing      What is the patient wearing?: Underwear/pull up, Pants     Lower body assist Assist for lower body dressing: Supervision/Verbal cueing     Toileting Toileting    Toileting assist Assist for toileting: Supervision/Verbal cueing     Transfers Chair/bed transfer  Transfers assist     Chair/bed transfer assist level: Supervision/Verbal cueing     Locomotion Ambulation   Ambulation assist      Assist level: Minimal Assistance - Patient > 75% Assistive device: Walker-rolling Max distance: 50   Walk 10 feet activity   Assist     Assist level: Minimal Assistance - Patient > 75% Assistive device: Walker-rolling   Walk 50 feet activity   Assist Walk 50 feet with 2 turns activity did not occur: Safety/medical concerns  Assist level: Minimal Assistance - Patient > 75% Assistive device: Walker-rolling    Walk 150 feet activity   Assist Walk 150 feet activity did not occur: Safety/medical concerns         Walk 10 feet on uneven surface  activity   Assist Walk 10 feet  on uneven surfaces activity did not occur: Safety/medical concerns         Wheelchair     Assist Will patient use wheelchair at discharge?: No Type of Wheelchair: Manual    Wheelchair assist level: Minimal Assistance - Patient > 75% Max wheelchair distance: 30    Wheelchair 50 feet with 2 turns activity    Assist        Assist Level: Dependent - Patient 0%   Wheelchair 150 feet activity      Assist     Assist Level: Dependent - Patient 0%    Medical Problem List and Plan: 1.  Left side weakness secondary to acute right MCA, MCA/PCA and left punctate MCA infarctions  Continue CIR  -loop recorder in place  Weekend notes reviewed  Plan for d/c tomorrow  Will see patient for transitional care management in 1-2 weeks post-discharge 2.  DVT Prophylaxis/Anticoagulation: SCDs. Monitor for any signs of DVT 3. Pain Management:  Tylenol as needed 4. Mood:  Celexa 10 mg daily. Provide emotional support 5. Neuropsych: This patient is capable of making decisions on her own behalf. 6. Skin/Wound Care:  Routine skin checks 7. Fluids/Electrolytes/Nutrition:  encourage PO liquids 8.  Left ureteral stone with hydronephrosis. Status post stenting.  Follow-up urology services [facility]  -monitor urine output/Cr  Continue to monitor  9. CAD/CABG. Continue aspirin and Plavix 10. Hypertension. Lisinopril 5 mg daily, Coreg 12.5 mg twice a day, Norvasc 10 mg daily.    Slightly labile on 12/23, but relatively controlled 11. Diabetes mellitus.Hemoglobin A1c 6.0.SSI.  patient on Glucotrol 10 mg twice a day and Glucophage 1000 mg twice a day prior to admission.     -checking cbgs ac and hs  -resumed glucotrol, increased to 10mg  bid on 12/14  -resumed glucophage at 500mg  bid  Labile on 12/23, but overall controlled 12. AKI. Push po  Creatinine 1.22 on 12/19  Continue to monitor 13.  Leukocytosis     WBCs 11.0 on 12/23  Afebrile 14. Hyperlipidemia. Lipitor 15.  Hypothyroidism. 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  Acute blood loss anemia  Hemoglobin 10.2 on 12/23  Continue to monitor    LOS: 14 days A FACE TO FACE EVALUATION WAS PERFORMED  Ankit Lorie Phenix 03/23/2018, 11:13 AM

## 2018-03-23 NOTE — Progress Notes (Signed)
Occupational Therapy Discharge Summary  Patient Details  Name: Theresa Ray MRN: [id number] Date of Birth: [birth year]  Today's Date: 03/23/2018 OT Individual Time: 2637-8588 Session 2: 1300-1330  OT Individual Time Calculation (min): 55 min Session 2:  30 min   Patient has met 18 of 20 long term goals due to improved activity tolerance, improved balance, ability to compensate for deficits, improved attention and improved coordination.  Patient to discharge at overall Supervision level.  Patient's care partner is independent to provide the necessary physical and cognitive assistance at discharge.    Reasons goals not met: Pt has not met 2 of her cognition goals d/t ongoing cueing required for functional memory and attention. Pt to d/c at overall supervision level with a supportive husband to continue supporting her cognitive needs in the home setting.   Recommendation:  Patient will benefit from ongoing skilled OT services in home health setting to continue to advance functional skills in the area of BADL.  Equipment: TTB  Reasons for discharge: treatment goals met and discharge from hospital  Patient/family agrees with progress made and goals achieved: Yes   Skilled OT Intervention: Session 1: Session focused on b/d tasks at shower level and d/c planning. Pt completed bed mobility with use of bed rails and mod I. Pt still requiring vc for correct UE placement during transfers, however not requiring any lifting assistance. Pt used RW to complete functional mobility into bathroom with close (S). Pt sat on TTB and completed all bathing with close (S). Throughout session pt still requiring mod vc for task progression d/t slow processing. Pt donned shirt sitting in w/c with (S). LB clothing donned with (S) in standing. Pt was assisted in blow drying her hair for time management. Pt returned to bed and was left supine with all needs met, bed alarm set.   Session 2: Session focused on tub  transfers and toileting task. Pt was transported down to tub room for time management. Pt completed tub transfer with TTB after demonstration with moderate-heavy cueing but no physical assist. Pt then completed toileting with (S). Pt returned to bed and was left supine with all needs met, bed alarm set.   OT Discharge Precautions/Restrictions  Precautions Precautions: Fall Precaution Comments: h/o multiple falls, CHF and orthostatic hypotension Restrictions Weight Bearing Restrictions: No Pain Pain Assessment Pain Scale: 0-10 Pain Score: 0-No pain Pain Type: Acute pain Pain Location: Shoulder Pain Orientation: Left Pain Descriptors / Indicators: Aching Pain Frequency: Intermittent Pain Onset: On-going Patients Stated Pain Goal: 2 Pain Intervention(s): Medication (See eMAR);Repositioned;Heat applied Multiple Pain Sites: No Vision Baseline Vision/History: Wears glasses Wears Glasses: Reading only Patient Visual Report: No change from baseline Vision Assessment?: Yes Eye Alignment: Within Functional Limits Ocular Range of Motion: Within Functional Limits Alignment/Gaze Preference: Within Defined Limits Tracking/Visual Pursuits: Requires cues, head turns, or add eye shifts to track Saccades: Decreased speed of saccadic movement Convergence: Within functional limits Visual Fields: Left visual field deficit Additional Comments: Lt inattention remains Perception  Perception: Impaired Spatial Orientation: mild L inattention Praxis Praxis: Intact Cognition Overall Cognitive Status: Impaired/Different from baseline Arousal/Alertness: Awake/alert Orientation Level: Oriented X4 Attention: Selective Selective Attention: Impaired Selective Attention Impairment: Verbal basic;Functional basic Memory: Impaired Memory Impairment: Decreased recall of new information;Decreased short term memory Decreased Short Term Memory: Verbal basic;Functional basic Awareness: Impaired Awareness  Impairment: Intellectual impairment Problem Solving: Impaired Problem Solving Impairment: Verbal basic;Functional basic Executive Function: (All areas impacted by lower level deficits) Safety/Judgment: Impaired Comments: left inattention, poor task initiation, decreased  recall of deficits Sensation Sensation Light Touch: Appears Intact Hot/Cold: Appears Intact Proprioception: Appears Intact Coordination Gross Motor Movements are Fluid and Coordinated: Yes Fine Motor Movements are Fluid and Coordinated: Yes Coordination and Movement Description: delayed, slow processing Finger Nose Finger Test: accurate but slow rate Motor  Motor Motor: Other (comment) Motor - Discharge Observations: slow processing Mobility  Bed Mobility Bed Mobility: Rolling Right;Supine to Sit Rolling Right: Independent with assistive device Supine to Sit: Independent with assistive device Transfers Sit to Stand: Supervision/Verbal cueing  Trunk/Postural Assessment  Cervical Assessment Cervical Assessment: Exceptions to WFL(forward head) Thoracic Assessment Thoracic Assessment: Exceptions to WFL(rounded shoulders, kyphotic) Lumbar Assessment Lumbar Assessment: Exceptions to WFL(posterior pelvic tilt) Postural Control Postural Control: Deficits on evaluation Righting Reactions: delayed Protective Responses: delayed  Balance Balance Balance Assessed: Yes Static Sitting Balance Static Sitting - Balance Support: Feet supported Static Sitting - Level of Assistance: 6: Modified independent (Device/Increase time) Dynamic Sitting Balance Dynamic Sitting - Balance Support: Feet supported Dynamic Sitting - Level of Assistance: 6: Modified independent (Device/Increase time) Dynamic Sitting - Balance Activities: Reaching for weighted objects Static Standing Balance Static Standing - Balance Support: During functional activity;Bilateral upper extremity supported Static Standing - Level of Assistance: 5: Stand  by assistance Dynamic Standing Balance Dynamic Standing - Balance Support: During functional activity;Bilateral upper extremity supported Dynamic Standing - Level of Assistance: 5: Stand by assistance Extremity/Trunk Assessment RUE Assessment RUE Assessment: Within Functional Limits Active Range of Motion (AROM) Comments: WFL General Strength Comments: 4-/5 LUE Assessment LUE Assessment: Within Functional Limits Active Range of Motion (AROM) Comments: WFLs General Strength Comments: 4-/5 t/o   Curtis Sites 03/23/2018, 10:32 AM

## 2018-03-24 ENCOUNTER — Telehealth: Payer: Self-pay | Admitting: *Deleted

## 2018-03-24 ENCOUNTER — Inpatient Hospital Stay (HOSPITAL_COMMUNITY): Payer: Medicare HMO | Admitting: Speech Pathology

## 2018-03-24 LAB — GLUCOSE, CAPILLARY: Glucose-Capillary: 78 mg/dL (ref 70–99)

## 2018-03-24 NOTE — Progress Notes (Signed)
[place] PHYSICAL MEDICINE & REHABILITATION PROGRESS NOTE   Subjective/Complaints: Husband at bedside.  Patient states she slept well overnight.  She is excited about discharge home.   ROS: Denies CP, SOB, N/V/D  Objective:   No results found. Recent Labs    03/23/18 0926  WBC 11.0*  HGB 10.2*  HCT 31.8*  PLT 471*   No results for input(s): NA, K, CL, CO2, GLUCOSE, BUN, CREATININE, CALCIUM in the last 72 hours.  Intake/Output Summary (Last 24 hours) at 03/24/2018 1120 Last data filed at 03/23/2018 1900 Gross per 24 hour  Intake 240 ml  Output -  Net 240 ml     Physical Exam: Vital Signs Blood pressure 129/60, pulse 67, temperature 98.2 F (36.8 C), resp. rate 18, height 5\' 3"  (1.6 m), weight 84.2 kg, SpO2 98 %.  Constitutional: No distress . Vital signs reviewed. HENT: Normocephalic.  Atraumatic. Eyes: EOMI. No discharge. Cardiovascular: RRR. No JVD. Respiratory: CTA bilaterally. Normal effort. GI: BS +. Non-distended. Musc: No edema or tenderness in extremities. Neurological: alert and attentive. Intentional tremor RUE, unchanged. Follows simple commands.  Motor: LUE: 4-4+/5 proximal to distal LLE: HF, KE 4-4+/5, ADF 4+/5, unchanged. RUE: 4/5 proximal to distal, unchanged. RLE: HF, KE  3+/5, ADF 3+/5 Skin: Skin is warm and dry.  Psychiatric: Flat.  Slowed.  Assessment/Plan: 1. Functional deficits secondary to right MCA and left MCA infarcts which require 3+ hours per day of interdisciplinary therapy in a comprehensive inpatient rehab setting.  Physiatrist is providing close team supervision and 24 hour management of active medical problems listed below.  Physiatrist and rehab team continue to assess barriers to discharge/monitor patient progress toward functional and medical goals  Care Tool:  Bathing    Body parts bathed by patient: Right arm, Left arm, Chest, Abdomen, Face, Front perineal area, Right upper leg, Left upper leg, Right lower leg, Left  lower leg, Buttocks   Body parts bathed by helper: Buttocks     Bathing assist Assist Level: Supervision/Verbal cueing     Upper Body Dressing/Undressing Upper body dressing   What is the patient wearing?: Pull over shirt    Upper body assist Assist Level: Supervision/Verbal cueing    Lower Body Dressing/Undressing Lower body dressing      What is the patient wearing?: Underwear/pull up, Pants     Lower body assist Assist for lower body dressing: Supervision/Verbal cueing     Toileting Toileting    Toileting assist Assist for toileting: Supervision/Verbal cueing     Transfers Chair/bed transfer  Transfers assist     Chair/bed transfer assist level: Supervision/Verbal cueing     Locomotion Ambulation   Ambulation assist      Assist level: Supervision/Verbal cueing Assistive device: Walker-rolling Max distance: 100   Walk 10 feet activity   Assist     Assist level: Supervision/Verbal cueing Assistive device: Walker-rolling   Walk 50 feet activity   Assist Walk 50 feet with 2 turns activity did not occur: Safety/medical concerns  Assist level: Supervision/Verbal cueing Assistive device: Walker-rolling    Walk 150 feet activity   Assist Walk 150 feet activity did not occur: Safety/medical concerns         Walk 10 feet on uneven surface  activity   Assist Walk 10 feet on uneven surfaces activity did not occur: Safety/medical concerns   Assist level: Minimal Assistance - Patient > 75%     Wheelchair     Assist Will patient use wheelchair at discharge?: No Type of  Wheelchair: Agricultural engineer assist level: Minimal Assistance - Patient > 75% Max wheelchair distance: 30    Wheelchair 50 feet with 2 turns activity    Assist        Assist Level: Dependent - Patient 0%   Wheelchair 150 feet activity     Assist     Assist Level: Dependent - Patient 0%    Medical Problem List and Plan: 1.  Left side  weakness secondary to acute right MCA, MCA/PCA and left punctate MCA infarctions  Continue CIR  -loop recorder in place  DC today  Patient to follow-up with MD for transitional care management in 1-2 weeks post-discharge 2.  DVT Prophylaxis/Anticoagulation: SCDs. Monitor for any signs of DVT 3. Pain Management:  Tylenol as needed 4. Mood:  Celexa 10 mg daily. Provide emotional support 5. Neuropsych: This patient is capable of making decisions on her own behalf. 6. Skin/Wound Care:  Routine skin checks 7. Fluids/Electrolytes/Nutrition:  encourage PO liquids 8.  Left ureteral stone with hydronephrosis. Status post stenting.  Follow-up urology services [facility]  -monitor urine output/Cr  Continue to monitor  9. CAD/CABG. Continue aspirin and Plavix 10. Hypertension. Lisinopril 5 mg daily, Coreg 12.5 mg twice a day, Norvasc 10 mg daily.    Slightly labile on 12/23, but relatively controlled 11. Diabetes mellitus.Hemoglobin A1c 6.0.SSI.  patient on Glucotrol 10 mg twice a day and Glucophage 1000 mg twice a day prior to admission.     -checking cbgs ac and hs  -resumed glucotrol, increased to 10mg  bid on 12/14  -resumed glucophage at 500mg  bid  Improving on 12/24, will need laboratory monitoring and adjustments 12. AKI. Push po  Creatinine 1.22 on 12/19  Continue to monitor 13.  Leukocytosis     WBCs 11.0 on 12/23  Afebrile 14. Hyperlipidemia. Lipitor 15.  Hypothyroidism. 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  Acute blood loss anemia  Hemoglobin 10.2 on 12/23  Continue to monitor    LOS: 15 days A FACE TO FACE EVALUATION WAS PERFORMED  Ankit Lorie Phenix 03/24/2018, 11:20 AM

## 2018-03-24 NOTE — Progress Notes (Signed)
Husband reports that equipment to be delivered to the house.

## 2018-03-24 NOTE — Progress Notes (Signed)
Social Work  Discharge Note  The overall goal for the admission was met for:   Discharge location: Yes - home with spouse who can provide 24/7 assistance  Length of Stay: Yes - 15 days  Discharge activity level: Yes - supervision/ CGA  Home/community participation: Yes  Services provided included: MD, RD, PT, OT, SLP, RN, TR, Pharmacy and [place]: Medicare  Follow-up services arranged: Home Health: PT, OT, ST via [place], DME: 18x18 lighweight w/c, cushion, rolling walker, tub bench via [facility] and Patient/Family has no preference for HH/DME agencies  Comments (or additional information):  Patient/Family verbalized understanding of follow-up arrangements: Yes  Individual responsible for coordination of the follow-up plan: pt  Confirmed correct DME delivered: Shawne Bulow 03/24/2018    Bayler Gehrig

## 2018-03-24 NOTE — Plan of Care (Signed)
f °

## 2018-03-24 NOTE — Telephone Encounter (Signed)
Called and requested that patient send a manual transmission. Patient verbalized understanding and plans to send soon.

## 2018-03-26 ENCOUNTER — Encounter: Payer: Self-pay | Admitting: Physical Medicine & Rehabilitation

## 2018-03-26 NOTE — Telephone Encounter (Signed)
Manual transmission received and reviewed. 6 "AF" episodes appear SR w/PACs and PVCs, not AF. ECGs printed and placed in Dr. Kathalene Frames folder for review.

## 2018-03-26 NOTE — Telephone Encounter (Signed)
Left message w/ pt husband for pt to return my call. She needs to send a manual transmission according to phone note from Memory Dance, RN from 03-24-2018.

## 2018-03-26 NOTE — Telephone Encounter (Signed)
Spoke w/ pt and her husband and instructed her how to send manual transmission w/ the home monitor. transmission received.

## 2018-03-27 ENCOUNTER — Telehealth: Payer: Self-pay

## 2018-03-27 NOTE — Telephone Encounter (Signed)
Transitional Care call-Patient    1. Are you/is patient experiencing any problems since coming home? No Are there any questions regarding any aspect of care? No 2. Are there any questions regarding medications administration/dosing? No Are meds being taken as prescribed? Yes Patient should review meds with caller to confirm 3. Have there been any falls? No 4. Has Home Health been to the house and/or have they contacted you? No If not, have you tried to contact them? No Can we help you contact them? Told pt to call back Monday if [facility] has not contacted yet, it may be because of the holiday. Patient was discharged 12-24 5. Are bowels and bladder emptying properly? Yes Are there any unexpected incontinence issues?No If applicable, is patient following bowel/bladder programs? 6. Any fevers, problems with breathing, unexpected pain? No 7. Are there any skin problems or new areas of breakdown? No  8. Has the patient/family member arranged specialty MD follow up (ie cardiology/neurology/renal/surgical/etc)? Yes Can we help arrange? 9. Does the patient need any other services or support that we can help arrange? No 10. Are caregivers following through as expected in assisting the patient? Yes 11. Has the patient quit smoking, drinking alcohol, or using drugs as recommended? Yes  Appointment time, arrive time 8:40 for 9:00 appt on 04-06-18 with Zella Ball then back to see Dr. Angelique Holm [address]

## 2018-03-30 DIAGNOSIS — I951 Orthostatic hypotension: Secondary | ICD-10-CM | POA: Diagnosis not present

## 2018-03-30 DIAGNOSIS — I110 Hypertensive heart disease with heart failure: Secondary | ICD-10-CM | POA: Diagnosis not present

## 2018-03-30 DIAGNOSIS — E1140 Type 2 diabetes mellitus with diabetic neuropathy, unspecified: Secondary | ICD-10-CM | POA: Diagnosis not present

## 2018-03-30 DIAGNOSIS — I5032 Chronic diastolic (congestive) heart failure: Secondary | ICD-10-CM | POA: Diagnosis not present

## 2018-03-30 DIAGNOSIS — E785 Hyperlipidemia, unspecified: Secondary | ICD-10-CM | POA: Diagnosis not present

## 2018-03-30 DIAGNOSIS — I252 Old myocardial infarction: Secondary | ICD-10-CM | POA: Diagnosis not present

## 2018-03-30 DIAGNOSIS — R419 Unspecified symptoms and signs involving cognitive functions and awareness: Secondary | ICD-10-CM | POA: Diagnosis not present

## 2018-03-30 DIAGNOSIS — I69354 Hemiplegia and hemiparesis following cerebral infarction affecting left non-dominant side: Secondary | ICD-10-CM | POA: Diagnosis not present

## 2018-03-30 DIAGNOSIS — I2510 Atherosclerotic heart disease of native coronary artery without angina pectoris: Secondary | ICD-10-CM | POA: Diagnosis not present

## 2018-03-30 DIAGNOSIS — E669 Obesity, unspecified: Secondary | ICD-10-CM | POA: Diagnosis not present

## 2018-03-31 ENCOUNTER — Other Ambulatory Visit: Payer: Self-pay

## 2018-03-31 NOTE — Patient Outreach (Signed)
[facility]) Care Management  03/31/2018  Theresa Ray [birth year] [id number]    EMMI-STROKE RED ON EMMI ALERT Day # 6 Date: 03/30/18 Red Alert Reason: "Feeling worse overall? Yes"   Outreach attempt # 1 to patient.Spoke with patient who voices she has been doing fairly ell since return home.Reviewed and addressed red alert with patient. She reports that she had a "dizzy spell" on yesterday while she was ambulating and working with therapy. It only lasted a few minutes. She states her BP was checked and it was normal. She denies any other symptoms. She feels like maybe she might have overdone it a little bit. She reports that she is feeling fine today and no further episodes. She has MD appt made and denies any issues with transportation. RN CM confirmed that patient has all her meds and voices no issues or concerns regarding them. Advised patient that they would continue to get automated EMMI-Stroke post discharge calls to assess how they are doing following recent hospitalization and will receive a call from a nurse if any of their responses were abnormal. Patient voiced understanding and was appreciative of f/u call.       Plan: RN CM will close case at this time.  Enzo Montgomery, RN,BSN,CCM [place] Management Telephonic Care Management Coordinator Direct Phone: [phone] Toll Free: [phone] Fax: [phone]

## 2018-04-02 ENCOUNTER — Telehealth: Payer: Self-pay

## 2018-04-02 DIAGNOSIS — I110 Hypertensive heart disease with heart failure: Secondary | ICD-10-CM | POA: Diagnosis not present

## 2018-04-02 DIAGNOSIS — E1140 Type 2 diabetes mellitus with diabetic neuropathy, unspecified: Secondary | ICD-10-CM | POA: Diagnosis not present

## 2018-04-02 DIAGNOSIS — I252 Old myocardial infarction: Secondary | ICD-10-CM | POA: Diagnosis not present

## 2018-04-02 DIAGNOSIS — E785 Hyperlipidemia, unspecified: Secondary | ICD-10-CM | POA: Diagnosis not present

## 2018-04-02 DIAGNOSIS — I5032 Chronic diastolic (congestive) heart failure: Secondary | ICD-10-CM | POA: Diagnosis not present

## 2018-04-02 DIAGNOSIS — I951 Orthostatic hypotension: Secondary | ICD-10-CM | POA: Diagnosis not present

## 2018-04-02 DIAGNOSIS — I2510 Atherosclerotic heart disease of native coronary artery without angina pectoris: Secondary | ICD-10-CM | POA: Diagnosis not present

## 2018-04-02 DIAGNOSIS — E669 Obesity, unspecified: Secondary | ICD-10-CM | POA: Diagnosis not present

## 2018-04-02 DIAGNOSIS — R419 Unspecified symptoms and signs involving cognitive functions and awareness: Secondary | ICD-10-CM | POA: Diagnosis not present

## 2018-04-02 DIAGNOSIS — I69354 Hemiplegia and hemiparesis following cerebral infarction affecting left non-dominant side: Secondary | ICD-10-CM | POA: Diagnosis not present

## 2018-04-02 NOTE — Telephone Encounter (Signed)
Pt's husband will assist pt in sending in remote transmission.

## 2018-04-03 ENCOUNTER — Telehealth: Payer: Self-pay

## 2018-04-03 DIAGNOSIS — I252 Old myocardial infarction: Secondary | ICD-10-CM | POA: Diagnosis not present

## 2018-04-03 DIAGNOSIS — I2510 Atherosclerotic heart disease of native coronary artery without angina pectoris: Secondary | ICD-10-CM | POA: Diagnosis not present

## 2018-04-03 DIAGNOSIS — E1140 Type 2 diabetes mellitus with diabetic neuropathy, unspecified: Secondary | ICD-10-CM | POA: Diagnosis not present

## 2018-04-03 DIAGNOSIS — I5032 Chronic diastolic (congestive) heart failure: Secondary | ICD-10-CM | POA: Diagnosis not present

## 2018-04-03 DIAGNOSIS — R419 Unspecified symptoms and signs involving cognitive functions and awareness: Secondary | ICD-10-CM | POA: Diagnosis not present

## 2018-04-03 DIAGNOSIS — E669 Obesity, unspecified: Secondary | ICD-10-CM | POA: Diagnosis not present

## 2018-04-03 DIAGNOSIS — I69354 Hemiplegia and hemiparesis following cerebral infarction affecting left non-dominant side: Secondary | ICD-10-CM | POA: Diagnosis not present

## 2018-04-03 DIAGNOSIS — E785 Hyperlipidemia, unspecified: Secondary | ICD-10-CM | POA: Diagnosis not present

## 2018-04-03 DIAGNOSIS — I110 Hypertensive heart disease with heart failure: Secondary | ICD-10-CM | POA: Diagnosis not present

## 2018-04-03 DIAGNOSIS — I951 Orthostatic hypotension: Secondary | ICD-10-CM | POA: Diagnosis not present

## 2018-04-03 NOTE — Telephone Encounter (Signed)
Dr. Curt Bears reviewed ECGs through 04/02/18--advised ECGs show SR w/PACs and PVCs. Plan to continue monitoring for true AF episodes.

## 2018-04-03 NOTE — Telephone Encounter (Signed)
Theresa Ray, OT from [facility] called requesting verbal orders HHOT 1wk1,2wk1,1wk1. Orders approved and given per discharge summary

## 2018-04-03 NOTE — Telephone Encounter (Signed)
Manual transmission received. Will review with Dr. Curt Bears.

## 2018-04-06 ENCOUNTER — Encounter (HOSPITAL_COMMUNITY): Payer: Self-pay

## 2018-04-06 ENCOUNTER — Telehealth: Payer: Self-pay | Admitting: Cardiology

## 2018-04-06 ENCOUNTER — Encounter: Payer: Medicare HMO | Admitting: Registered Nurse

## 2018-04-06 DIAGNOSIS — Z09 Encounter for follow-up examination after completed treatment for conditions other than malignant neoplasm: Secondary | ICD-10-CM | POA: Diagnosis not present

## 2018-04-06 DIAGNOSIS — I6930 Unspecified sequelae of cerebral infarction: Secondary | ICD-10-CM | POA: Diagnosis not present

## 2018-04-06 DIAGNOSIS — Z9181 History of falling: Secondary | ICD-10-CM | POA: Diagnosis not present

## 2018-04-06 DIAGNOSIS — Z1331 Encounter for screening for depression: Secondary | ICD-10-CM | POA: Diagnosis not present

## 2018-04-06 DIAGNOSIS — Z6834 Body mass index (BMI) 34.0-34.9, adult: Secondary | ICD-10-CM | POA: Diagnosis not present

## 2018-04-06 NOTE — Telephone Encounter (Signed)
LMOVM for pt to return call. Manual transmission is needed.

## 2018-04-07 ENCOUNTER — Encounter: Payer: Medicare HMO | Attending: Registered Nurse | Admitting: Registered Nurse

## 2018-04-07 ENCOUNTER — Encounter: Payer: Self-pay | Admitting: Registered Nurse

## 2018-04-07 ENCOUNTER — Other Ambulatory Visit: Payer: Self-pay

## 2018-04-07 VITALS — BP 100/64 | HR 65 | Resp 14 | Ht 63.0 in | Wt 190.0 lb

## 2018-04-07 DIAGNOSIS — E785 Hyperlipidemia, unspecified: Secondary | ICD-10-CM | POA: Insufficient documentation

## 2018-04-07 DIAGNOSIS — R419 Unspecified symptoms and signs involving cognitive functions and awareness: Secondary | ICD-10-CM | POA: Diagnosis not present

## 2018-04-07 DIAGNOSIS — E1140 Type 2 diabetes mellitus with diabetic neuropathy, unspecified: Secondary | ICD-10-CM | POA: Diagnosis not present

## 2018-04-07 DIAGNOSIS — I252 Old myocardial infarction: Secondary | ICD-10-CM | POA: Diagnosis not present

## 2018-04-07 DIAGNOSIS — E119 Type 2 diabetes mellitus without complications: Secondary | ICD-10-CM

## 2018-04-07 DIAGNOSIS — I951 Orthostatic hypotension: Secondary | ICD-10-CM | POA: Diagnosis not present

## 2018-04-07 DIAGNOSIS — I10 Essential (primary) hypertension: Secondary | ICD-10-CM | POA: Diagnosis present

## 2018-04-07 DIAGNOSIS — E038 Other specified hypothyroidism: Secondary | ICD-10-CM | POA: Diagnosis present

## 2018-04-07 DIAGNOSIS — I2510 Atherosclerotic heart disease of native coronary artery without angina pectoris: Secondary | ICD-10-CM | POA: Diagnosis not present

## 2018-04-07 DIAGNOSIS — G460 Middle cerebral artery syndrome: Secondary | ICD-10-CM | POA: Diagnosis present

## 2018-04-07 DIAGNOSIS — N132 Hydronephrosis with renal and ureteral calculous obstruction: Secondary | ICD-10-CM | POA: Diagnosis present

## 2018-04-07 DIAGNOSIS — I69354 Hemiplegia and hemiparesis following cerebral infarction affecting left non-dominant side: Secondary | ICD-10-CM | POA: Diagnosis not present

## 2018-04-07 DIAGNOSIS — E669 Obesity, unspecified: Secondary | ICD-10-CM | POA: Diagnosis not present

## 2018-04-07 DIAGNOSIS — I5032 Chronic diastolic (congestive) heart failure: Secondary | ICD-10-CM | POA: Diagnosis not present

## 2018-04-07 DIAGNOSIS — I110 Hypertensive heart disease with heart failure: Secondary | ICD-10-CM | POA: Diagnosis not present

## 2018-04-07 NOTE — Telephone Encounter (Signed)
Manual transmission received and reviewed. 3 "AF" episodes show SR w/PACs and PVCs, not true AF.

## 2018-04-07 NOTE — Progress Notes (Signed)
Subjective:    Patient ID: Theresa Ray, female    DOB: 07/20/1945, 73 y.o.   MRN: [id number]  HPI: Theresa Ray is a 73 y.o. female who is here  for Transitional Care visit. She was transferred from [facility] to [facility]  due to altered mental status and left sided weakness.   CT of Head without Contrast. On 03/02/2018 IMPRESSION: 1. No acute intracranial hemorrhage. 2. Age-related atrophy and chronic microvascular ischemic changes.  MRI Brain WO Contrast: 03/03/2018 IMPRESSION: 1. Multifocal acute bilateral small infarcts spanning multiple vascular territories most compatible with embolic phenomena. 2. Moderate to severe chronic small vessel ischemic changes. 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  [name] underwent  a loop recorder placed on 03/06/2018.   CT of Abdomen: On 03/13/2018 IMPRESSION: 2 LEFT renal calculi as above.  [name] underwent double J  Stent Placement at [facility]. Urology Following.   Today 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 She has been home with [place] with [place]. She denies pain rates her pain 0. Also reports her appetite is improving.   Husband in room all questions answered.    Pain Inventory Average Pain 0 Pain Right Now 0 My pain is dull  In the last 24 hours, has pain interfered with the following? General activity 0 Relation with others 0 Enjoyment of life 0 What TIME of day is your pain at its worst? morning Sleep (in general) Fair  Pain is worse with: unsure Pain improves with: therapy/exercise Relief from Meds: 5  Mobility walk with assistance use a walker ability to climb steps?  yes do you drive?  no use a wheelchair needs help with transfers  Function retired I  need assistance with the following:  dressing, bathing, toileting, meal prep, household duties and shopping  Neuro/Psych bladder control problems weakness tremor trouble walking dizziness depression anxiety loss of taste or smell  Prior Studies transitional care  Physicians involved in your care transitional care   Family History  Problem Relation Age of Onset  . Heart disease Mother 71       CABG  . CVA Father    Social History   Socioeconomic History  . Marital status: Married    Spouse name: Not on file  . Number of children: Not on file  . Years of education: Not on file  . Highest education level: Not on file  Occupational History  . Not on file  Social Needs  . Financial resource strain: Not on file  . Food insecurity:    Worry: Not on file    Inability: Not on file  . Transportation needs:    Medical: Not on file    Non-medical: Not on file  Tobacco Use  . Smoking status: Former Smoker    Types: Cigarettes    Start date: 1967    Last attempt to quit: 1975    Years since quitting: 45.0  . Smokeless tobacco: Never Used  Substance and Sexual Activity  . Alcohol use: No    Frequency: Never  . Drug use: No  . Sexual activity: Not on file  Lifestyle  . Physical activity:    Days per week: Not on file    Minutes per session: Not on file  . Stress: Not on file  Relationships  . Social connections:    Talks on phone: Not on file    Gets together: Not on file    Attends religious service: Not on file    Active member of club or organization: Not on file    Attends meetings of clubs or organizations: Not on file    Relationship status: Not on file  Other Topics Concern  . Not on file  Social History Narrative  . Not on file   Past Surgical History:  Procedure Laterality Date  . ABDOMINAL HYSTERECTOMY    . CORONARY ARTERY BYPASS GRAFT N/A 02/24/2017   Procedure: CORONARY ARTERY BYPASS GRAFTING (CABG) USING LEFT INTERNAL MAMMARY ARTERY TO LAD AND  ENDOSCOPICALLY HARVESTED GREATER SAPHENOUS VEIN TO PDA AND TO OM1.;  Surgeon: Grace Isaac, MD;  Location: [place];  Service: Open Heart Surgery;  Laterality: N/A;  . ENDOVEIN HARVEST OF GREATER SAPHENOUS VEIN Right 02/24/2017   Procedure: ENDOVEIN HARVEST OF GREATER SAPHENOUS VEIN;  Surgeon: Grace Isaac, MD;  Location: [place];  Service: Open Heart Surgery;  Laterality: Right;  . LAPAROSCOPIC GASTRIC BANDING    . LEFT HEART CATH AND CORONARY ANGIOGRAPHY N/A 02/18/2017   Procedure: LEFT HEART CATH AND CORONARY ANGIOGRAPHY;  Surgeon: Leonie Man, MD;  Location: [place] CV LAB;  Service: Cardiovascular;  Laterality: N/A;  . LOOP RECORDER INSERTION N/A 03/06/2018   Procedure: LOOP RECORDER INSERTION;  Surgeon: Constance Haw, MD;  Location: [place] CV LAB;  Service: Cardiovascular;  Laterality: N/A;  . TEE WITHOUT CARDIOVERSION N/A 02/24/2017   Procedure: TRANSESOPHAGEAL ECHOCARDIOGRAM (TEE);  Surgeon: Grace Isaac, MD;  Location: [place];  Service: Open Heart Surgery;  Laterality: N/A;  . TEE WITHOUT CARDIOVERSION N/A 03/06/2018   Procedure: TRANSESOPHAGEAL ECHOCARDIOGRAM (TEE);  Surgeon: Dorothy Spark, MD;  Location: [facility] ENDOSCOPY;  Service: Cardiovascular;  Laterality: N/A;  loop   Past Medical History:  Diagnosis Date  . CAD in native artery    a. s/p CABGx3 in 01/2017.  Marland Kitchen Chronic diastolic CHF (congestive heart failure) ([place])   . Diabetes mellitus ([place])   . Diabetic neuropathy ([place])   . History of kidney stones    history of stones  . Hypertension   . Hypothyroidism   . NSTEMI (non-ST elevated myocardial infarction) ([place])   . Orthostatic hypotension   . Postoperative atrial fibrillation ([place])    a. after CABG 01/2017.  Marland Kitchen TIA (transient ischemic attack)    BP 100/64   Pulse 65   Resp 14   Ht 5\' 3"  (1.6 m) Comment: previous  Wt 190 lb (86.2 kg) Comment: previous  LMP  (LMP Unknown)   SpO2 95%   BMI 33.66 kg/m   Opioid Risk Score:   Fall Risk  Score:  `1  Depression screen PHQ 2/9  No flowsheet data found.  Review of Systems  Constitutional: Positive for fatigue.  HENT: Negative.   Eyes: Negative.   Respiratory: Negative.   Cardiovascular: Negative.   Gastrointestinal: Negative.   Endocrine: Negative.   Genitourinary: Positive for difficulty urinating.  Musculoskeletal: Positive for gait problem.  Allergic/Immunologic: Negative.   Neurological: Positive for dizziness, tremors and weakness.  Psychiatric/Behavioral: Positive for dysphoric mood. The patient is nervous/anxious.   All other systems reviewed and are negative.      Objective:   Physical Exam Vitals signs and nursing note reviewed.  Constitutional:      Appearance: Normal appearance.  Neck:     Musculoskeletal: Normal range of motion  and neck supple.  Cardiovascular:     Rate and Rhythm: Rhythm irregular.     Comments: S1S2 IRR Pulmonary:     Effort: Pulmonary effort is normal.     Breath sounds: Normal breath sounds.  Musculoskeletal:     Comments: Normal Muscle Bulk and Muscle Testing Reveals:  Upper Extremities: Full ROM and Muscle Strength 4/5 Lower Extremities: Right: Decreased ROM and Muscle Strength 4/5 Left Lower Extremity: Left Full ROM and Muscle Strength 5/5 Arises from Table with ease using walker for support.  Narrow Based  Gait   Skin:    General: Skin is warm and dry.  Neurological:     Mental Status: She is alert and oriented to person, place, and time.  Psychiatric:        Mood and Affect: Mood normal.        Behavior: Behavior normal.           Assessment & Plan:  1. Middle Cerebral Artery Syndrome: Continue Home Health Therapies with [facility]. Has a Follow up appointment scheduled with Neurology.  Continue Plavix.  2. Hypertension: Continue current medication regime. PCP Following.  3.Dyslipidemia: Continue current medication regime. PCP Following. 4. 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 PCP Following.  5. Hypothyroidism: Continue current medication Regime. PCP Following.  6. 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   30 minutes of face to face patient care time was spent during this visit. All questions were encouraged and answered.  F/U in 4- 6 weeks with Dr Naaman Plummer.

## 2018-04-07 NOTE — Patient Outreach (Signed)
[facility]) Care Management  04/07/2018  LAREE GARRON 13-Feb-1946 [id number]    EMMI-STROKE RED ON EMMI ALERT Day # 13 Date: 04/06/2018 Red Alert Reason: " Went to follow up appt? No"   Outreach attempt # 1 to patient. No answer at present. RN CM left HIPAA compliant voicemail message along with contact info.       Plan: RN CM will make outreach attempt to patient within 3-4 business days. RN CM will send unsuccessful outreach letter to patient.   Enzo Montgomery, RN,BSN,CCM [place] Management Telephonic Care Management Coordinator Direct Phone: [phone] Toll Free: [phone] Fax: [phone]

## 2018-04-08 ENCOUNTER — Ambulatory Visit (INDEPENDENT_AMBULATORY_CARE_PROVIDER_SITE_OTHER): Payer: Medicare HMO

## 2018-04-08 ENCOUNTER — Other Ambulatory Visit: Payer: Self-pay

## 2018-04-08 DIAGNOSIS — I639 Cerebral infarction, unspecified: Secondary | ICD-10-CM

## 2018-04-08 NOTE — Patient Outreach (Signed)
[facility]) Care Management  04/08/2018  Theresa Ray 1946/02/21 [id number]    EMMI-STROKE RED ON EMMI ALERT Day # 13 Date: 04/06/2018 Red Alert Reason: " Went to follow up appt? No"   Outreach attempt #2 to patient.  No answer at present.    Plan: RN CM will make outreach attempt to patient within 3-4 business days.   Theresa Montgomery, RN,BSN,CCM [place] Management Telephonic Care Management Coordinator Direct Phone: [phone] Toll Free: [phone] Fax: [phone]

## 2018-04-09 ENCOUNTER — Telehealth: Payer: Self-pay

## 2018-04-09 ENCOUNTER — Other Ambulatory Visit: Payer: Self-pay

## 2018-04-09 DIAGNOSIS — I2510 Atherosclerotic heart disease of native coronary artery without angina pectoris: Secondary | ICD-10-CM | POA: Diagnosis not present

## 2018-04-09 LAB — CUP PACEART REMOTE DEVICE CHECK
Date Time Interrogation Session: 20200108211110
Implantable Pulse Generator Implant Date: 20191206

## 2018-04-09 NOTE — Telephone Encounter (Signed)
Dr. Curt Bears reviewed "AF" episodes from 1/8 and 1/9. He does not feel any episodes thus far clearly indicate A-fib. He advised that the episode from 1/9 is indeterminate due to artifact. Plan to continue monitoring for clear A-fib episodes.

## 2018-04-09 NOTE — Progress Notes (Signed)
Carelink Summary Report / Loop Recorder 

## 2018-04-09 NOTE — Patient Outreach (Signed)
[facility]) Care Management  04/09/2018  Theresa Ray [birth year] [id number]   EMMI-STROKE RED ON EMMI ALERT Day #13 Date:04/06/2018 Red Alert Reason:" Martin Majestic to follow up appt? No"   Outreach attempt #3 to patient. No answer at present.    Plan: RN CM will close case if no response from letter mailed to patient.  Enzo Montgomery, RN,BSN,CCM [place] Management Telephonic Care Management Coordinator Direct Phone: [phone] Toll Free: [phone] Fax: [phone]

## 2018-04-09 NOTE — Telephone Encounter (Signed)
I spoke with the patient husband and he agreed to send a manual transmission today.

## 2018-04-10 ENCOUNTER — Other Ambulatory Visit: Payer: Self-pay | Admitting: Cardiology

## 2018-04-10 DIAGNOSIS — N201 Calculus of ureter: Secondary | ICD-10-CM | POA: Diagnosis not present

## 2018-04-10 DIAGNOSIS — I5032 Chronic diastolic (congestive) heart failure: Secondary | ICD-10-CM | POA: Diagnosis not present

## 2018-04-10 DIAGNOSIS — R7881 Bacteremia: Secondary | ICD-10-CM | POA: Diagnosis not present

## 2018-04-10 DIAGNOSIS — I2510 Atherosclerotic heart disease of native coronary artery without angina pectoris: Secondary | ICD-10-CM | POA: Diagnosis not present

## 2018-04-10 DIAGNOSIS — I340 Nonrheumatic mitral (valve) insufficiency: Secondary | ICD-10-CM | POA: Diagnosis not present

## 2018-04-10 DIAGNOSIS — Z01818 Encounter for other preprocedural examination: Secondary | ICD-10-CM | POA: Diagnosis not present

## 2018-04-10 DIAGNOSIS — E1122 Type 2 diabetes mellitus with diabetic chronic kidney disease: Secondary | ICD-10-CM | POA: Diagnosis not present

## 2018-04-10 DIAGNOSIS — N132 Hydronephrosis with renal and ureteral calculous obstruction: Secondary | ICD-10-CM | POA: Diagnosis not present

## 2018-04-10 DIAGNOSIS — Z7401 Bed confinement status: Secondary | ICD-10-CM | POA: Diagnosis not present

## 2018-04-10 DIAGNOSIS — J90 Pleural effusion, not elsewhere classified: Secondary | ICD-10-CM | POA: Diagnosis not present

## 2018-04-10 DIAGNOSIS — R6520 Severe sepsis without septic shock: Secondary | ICD-10-CM | POA: Diagnosis not present

## 2018-04-10 DIAGNOSIS — J9690 Respiratory failure, unspecified, unspecified whether with hypoxia or hypercapnia: Secondary | ICD-10-CM | POA: Diagnosis not present

## 2018-04-10 DIAGNOSIS — J80 Acute respiratory distress syndrome: Secondary | ICD-10-CM | POA: Diagnosis not present

## 2018-04-10 DIAGNOSIS — J189 Pneumonia, unspecified organism: Secondary | ICD-10-CM | POA: Diagnosis not present

## 2018-04-10 DIAGNOSIS — R0902 Hypoxemia: Secondary | ICD-10-CM | POA: Diagnosis not present

## 2018-04-10 DIAGNOSIS — I10 Essential (primary) hypertension: Secondary | ICD-10-CM | POA: Diagnosis not present

## 2018-04-10 DIAGNOSIS — N390 Urinary tract infection, site not specified: Secondary | ICD-10-CM | POA: Diagnosis not present

## 2018-04-10 DIAGNOSIS — R5381 Other malaise: Secondary | ICD-10-CM | POA: Diagnosis not present

## 2018-04-10 DIAGNOSIS — A419 Sepsis, unspecified organism: Secondary | ICD-10-CM | POA: Diagnosis not present

## 2018-04-10 DIAGNOSIS — E1165 Type 2 diabetes mellitus with hyperglycemia: Secondary | ICD-10-CM | POA: Diagnosis not present

## 2018-04-10 DIAGNOSIS — R0682 Tachypnea, not elsewhere classified: Secondary | ICD-10-CM | POA: Diagnosis not present

## 2018-04-10 DIAGNOSIS — J9601 Acute respiratory failure with hypoxia: Secondary | ICD-10-CM | POA: Diagnosis not present

## 2018-04-10 DIAGNOSIS — D649 Anemia, unspecified: Secondary | ICD-10-CM | POA: Diagnosis not present

## 2018-04-10 DIAGNOSIS — A4189 Other specified sepsis: Secondary | ICD-10-CM | POA: Diagnosis not present

## 2018-04-10 DIAGNOSIS — R0689 Other abnormalities of breathing: Secondary | ICD-10-CM | POA: Diagnosis not present

## 2018-04-10 DIAGNOSIS — R7989 Other specified abnormal findings of blood chemistry: Secondary | ICD-10-CM | POA: Diagnosis not present

## 2018-04-10 DIAGNOSIS — E785 Hyperlipidemia, unspecified: Secondary | ICD-10-CM | POA: Diagnosis not present

## 2018-04-10 DIAGNOSIS — I214 Non-ST elevation (NSTEMI) myocardial infarction: Secondary | ICD-10-CM | POA: Diagnosis not present

## 2018-04-10 DIAGNOSIS — I25810 Atherosclerosis of coronary artery bypass graft(s) without angina pectoris: Secondary | ICD-10-CM | POA: Diagnosis not present

## 2018-04-10 DIAGNOSIS — I700 Atherosclerosis of aorta: Secondary | ICD-10-CM | POA: Diagnosis not present

## 2018-04-10 DIAGNOSIS — J9811 Atelectasis: Secondary | ICD-10-CM | POA: Diagnosis not present

## 2018-04-10 DIAGNOSIS — I130 Hypertensive heart and chronic kidney disease with heart failure and stage 1 through stage 4 chronic kidney disease, or unspecified chronic kidney disease: Secondary | ICD-10-CM | POA: Diagnosis not present

## 2018-04-10 DIAGNOSIS — R0602 Shortness of breath: Secondary | ICD-10-CM | POA: Diagnosis not present

## 2018-04-11 DIAGNOSIS — R7881 Bacteremia: Secondary | ICD-10-CM

## 2018-04-11 DIAGNOSIS — A419 Sepsis, unspecified organism: Secondary | ICD-10-CM

## 2018-04-11 DIAGNOSIS — N201 Calculus of ureter: Secondary | ICD-10-CM | POA: Diagnosis not present

## 2018-04-12 DIAGNOSIS — D649 Anemia, unspecified: Secondary | ICD-10-CM

## 2018-04-13 DIAGNOSIS — I10 Essential (primary) hypertension: Secondary | ICD-10-CM

## 2018-04-13 DIAGNOSIS — R7989 Other specified abnormal findings of blood chemistry: Secondary | ICD-10-CM

## 2018-04-13 DIAGNOSIS — E785 Hyperlipidemia, unspecified: Secondary | ICD-10-CM

## 2018-04-13 DIAGNOSIS — R0602 Shortness of breath: Secondary | ICD-10-CM

## 2018-04-17 DIAGNOSIS — I214 Non-ST elevation (NSTEMI) myocardial infarction: Secondary | ICD-10-CM | POA: Diagnosis not present

## 2018-04-17 DIAGNOSIS — R0602 Shortness of breath: Secondary | ICD-10-CM | POA: Diagnosis not present

## 2018-04-17 DIAGNOSIS — I69354 Hemiplegia and hemiparesis following cerebral infarction affecting left non-dominant side: Secondary | ICD-10-CM | POA: Diagnosis not present

## 2018-04-17 DIAGNOSIS — J189 Pneumonia, unspecified organism: Secondary | ICD-10-CM | POA: Diagnosis not present

## 2018-04-17 DIAGNOSIS — N200 Calculus of kidney: Secondary | ICD-10-CM | POA: Diagnosis not present

## 2018-04-17 DIAGNOSIS — R5381 Other malaise: Secondary | ICD-10-CM | POA: Diagnosis not present

## 2018-04-17 DIAGNOSIS — J9601 Acute respiratory failure with hypoxia: Secondary | ICD-10-CM | POA: Diagnosis not present

## 2018-04-17 DIAGNOSIS — E785 Hyperlipidemia, unspecified: Secondary | ICD-10-CM | POA: Diagnosis not present

## 2018-04-17 DIAGNOSIS — E119 Type 2 diabetes mellitus without complications: Secondary | ICD-10-CM | POA: Diagnosis not present

## 2018-04-17 DIAGNOSIS — N139 Obstructive and reflux uropathy, unspecified: Secondary | ICD-10-CM | POA: Diagnosis not present

## 2018-04-17 DIAGNOSIS — N390 Urinary tract infection, site not specified: Secondary | ICD-10-CM | POA: Diagnosis not present

## 2018-04-17 DIAGNOSIS — J9690 Respiratory failure, unspecified, unspecified whether with hypoxia or hypercapnia: Secondary | ICD-10-CM | POA: Diagnosis not present

## 2018-04-17 DIAGNOSIS — N393 Stress incontinence (female) (male): Secondary | ICD-10-CM | POA: Diagnosis not present

## 2018-04-17 DIAGNOSIS — Z7401 Bed confinement status: Secondary | ICD-10-CM | POA: Diagnosis not present

## 2018-04-17 DIAGNOSIS — N3020 Other chronic cystitis without hematuria: Secondary | ICD-10-CM | POA: Diagnosis not present

## 2018-04-17 DIAGNOSIS — I25810 Atherosclerosis of coronary artery bypass graft(s) without angina pectoris: Secondary | ICD-10-CM | POA: Diagnosis not present

## 2018-04-17 DIAGNOSIS — I10 Essential (primary) hypertension: Secondary | ICD-10-CM | POA: Diagnosis not present

## 2018-04-17 DIAGNOSIS — A419 Sepsis, unspecified organism: Secondary | ICD-10-CM | POA: Diagnosis not present

## 2018-04-17 DIAGNOSIS — D649 Anemia, unspecified: Secondary | ICD-10-CM | POA: Diagnosis not present

## 2018-04-17 DIAGNOSIS — R7881 Bacteremia: Secondary | ICD-10-CM | POA: Diagnosis not present

## 2018-04-17 DIAGNOSIS — I110 Hypertensive heart disease with heart failure: Secondary | ICD-10-CM | POA: Diagnosis not present

## 2018-04-17 DIAGNOSIS — I5032 Chronic diastolic (congestive) heart failure: Secondary | ICD-10-CM | POA: Diagnosis not present

## 2018-04-17 DIAGNOSIS — N201 Calculus of ureter: Secondary | ICD-10-CM | POA: Diagnosis not present

## 2018-04-18 DIAGNOSIS — E119 Type 2 diabetes mellitus without complications: Secondary | ICD-10-CM | POA: Diagnosis not present

## 2018-04-18 DIAGNOSIS — I10 Essential (primary) hypertension: Secondary | ICD-10-CM | POA: Diagnosis not present

## 2018-04-18 DIAGNOSIS — D649 Anemia, unspecified: Secondary | ICD-10-CM | POA: Diagnosis not present

## 2018-04-20 ENCOUNTER — Other Ambulatory Visit: Payer: Self-pay

## 2018-04-20 DIAGNOSIS — N3020 Other chronic cystitis without hematuria: Secondary | ICD-10-CM | POA: Diagnosis not present

## 2018-04-20 DIAGNOSIS — N393 Stress incontinence (female) (male): Secondary | ICD-10-CM | POA: Diagnosis not present

## 2018-04-20 DIAGNOSIS — N201 Calculus of ureter: Secondary | ICD-10-CM | POA: Diagnosis not present

## 2018-04-20 NOTE — Patient Outreach (Signed)
[facility]) Care Management  04/20/2018  Theresa Ray [birth year] [id number]    EMMI-STROKE RED ON EMMI ALERT Day #13 Date:04/06/2018 Red Alert Reason:" Martin Majestic to follow up appt? No"   Multiple attempts to establish contact with patient without success. No response from letter mailed to patient. Case is being closed at this time.       Plan: RN CM will close case at this time.   Enzo Montgomery, RN,BSN,CCM [place] Management Telephonic Care Management Coordinator Direct Phone: [phone] Toll Free: [phone] Fax: [phone]

## 2018-04-21 DIAGNOSIS — N390 Urinary tract infection, site not specified: Secondary | ICD-10-CM | POA: Diagnosis not present

## 2018-04-21 DIAGNOSIS — A419 Sepsis, unspecified organism: Secondary | ICD-10-CM | POA: Diagnosis not present

## 2018-04-21 DIAGNOSIS — J189 Pneumonia, unspecified organism: Secondary | ICD-10-CM | POA: Diagnosis not present

## 2018-04-21 DIAGNOSIS — R7881 Bacteremia: Secondary | ICD-10-CM | POA: Diagnosis not present

## 2018-04-23 ENCOUNTER — Ambulatory Visit: Payer: Self-pay | Admitting: Adult Health

## 2018-04-24 DIAGNOSIS — N201 Calculus of ureter: Secondary | ICD-10-CM | POA: Diagnosis not present

## 2018-04-24 DIAGNOSIS — E119 Type 2 diabetes mellitus without complications: Secondary | ICD-10-CM | POA: Diagnosis not present

## 2018-04-24 DIAGNOSIS — N200 Calculus of kidney: Secondary | ICD-10-CM | POA: Diagnosis not present

## 2018-04-27 DIAGNOSIS — E785 Hyperlipidemia, unspecified: Secondary | ICD-10-CM | POA: Diagnosis not present

## 2018-04-27 DIAGNOSIS — E119 Type 2 diabetes mellitus without complications: Secondary | ICD-10-CM | POA: Diagnosis not present

## 2018-04-27 DIAGNOSIS — J9601 Acute respiratory failure with hypoxia: Secondary | ICD-10-CM | POA: Diagnosis not present

## 2018-04-27 DIAGNOSIS — I10 Essential (primary) hypertension: Secondary | ICD-10-CM | POA: Diagnosis not present

## 2018-04-29 ENCOUNTER — Other Ambulatory Visit: Payer: Self-pay | Admitting: Cardiology

## 2018-04-30 DIAGNOSIS — E785 Hyperlipidemia, unspecified: Secondary | ICD-10-CM | POA: Diagnosis not present

## 2018-04-30 DIAGNOSIS — J9601 Acute respiratory failure with hypoxia: Secondary | ICD-10-CM | POA: Diagnosis not present

## 2018-04-30 DIAGNOSIS — E119 Type 2 diabetes mellitus without complications: Secondary | ICD-10-CM | POA: Diagnosis not present

## 2018-04-30 DIAGNOSIS — N139 Obstructive and reflux uropathy, unspecified: Secondary | ICD-10-CM | POA: Diagnosis not present

## 2018-05-01 ENCOUNTER — Telehealth: Payer: Self-pay

## 2018-05-01 DIAGNOSIS — N200 Calculus of kidney: Secondary | ICD-10-CM | POA: Diagnosis not present

## 2018-05-01 DIAGNOSIS — R419 Unspecified symptoms and signs involving cognitive functions and awareness: Secondary | ICD-10-CM | POA: Diagnosis not present

## 2018-05-01 DIAGNOSIS — E1140 Type 2 diabetes mellitus with diabetic neuropathy, unspecified: Secondary | ICD-10-CM | POA: Diagnosis not present

## 2018-05-01 DIAGNOSIS — E785 Hyperlipidemia, unspecified: Secondary | ICD-10-CM | POA: Diagnosis not present

## 2018-05-01 DIAGNOSIS — I252 Old myocardial infarction: Secondary | ICD-10-CM | POA: Diagnosis not present

## 2018-05-01 DIAGNOSIS — E669 Obesity, unspecified: Secondary | ICD-10-CM | POA: Diagnosis not present

## 2018-05-01 DIAGNOSIS — I5032 Chronic diastolic (congestive) heart failure: Secondary | ICD-10-CM | POA: Diagnosis not present

## 2018-05-01 DIAGNOSIS — N3090 Cystitis, unspecified without hematuria: Secondary | ICD-10-CM | POA: Diagnosis not present

## 2018-05-01 DIAGNOSIS — I951 Orthostatic hypotension: Secondary | ICD-10-CM | POA: Diagnosis not present

## 2018-05-01 DIAGNOSIS — I2510 Atherosclerotic heart disease of native coronary artery without angina pectoris: Secondary | ICD-10-CM | POA: Diagnosis not present

## 2018-05-01 DIAGNOSIS — I69354 Hemiplegia and hemiparesis following cerebral infarction affecting left non-dominant side: Secondary | ICD-10-CM | POA: Diagnosis not present

## 2018-05-01 DIAGNOSIS — I110 Hypertensive heart disease with heart failure: Secondary | ICD-10-CM | POA: Diagnosis not present

## 2018-05-01 NOTE — Telephone Encounter (Signed)
Pt  States she will send a transmission later on tonight.

## 2018-05-04 DIAGNOSIS — I69354 Hemiplegia and hemiparesis following cerebral infarction affecting left non-dominant side: Secondary | ICD-10-CM | POA: Diagnosis not present

## 2018-05-04 DIAGNOSIS — E669 Obesity, unspecified: Secondary | ICD-10-CM | POA: Diagnosis not present

## 2018-05-04 DIAGNOSIS — R419 Unspecified symptoms and signs involving cognitive functions and awareness: Secondary | ICD-10-CM | POA: Diagnosis not present

## 2018-05-04 DIAGNOSIS — I2510 Atherosclerotic heart disease of native coronary artery without angina pectoris: Secondary | ICD-10-CM | POA: Diagnosis not present

## 2018-05-04 DIAGNOSIS — I5032 Chronic diastolic (congestive) heart failure: Secondary | ICD-10-CM | POA: Diagnosis not present

## 2018-05-04 DIAGNOSIS — E785 Hyperlipidemia, unspecified: Secondary | ICD-10-CM | POA: Diagnosis not present

## 2018-05-04 DIAGNOSIS — E1140 Type 2 diabetes mellitus with diabetic neuropathy, unspecified: Secondary | ICD-10-CM | POA: Diagnosis not present

## 2018-05-04 DIAGNOSIS — I252 Old myocardial infarction: Secondary | ICD-10-CM | POA: Diagnosis not present

## 2018-05-04 DIAGNOSIS — I110 Hypertensive heart disease with heart failure: Secondary | ICD-10-CM | POA: Diagnosis not present

## 2018-05-04 DIAGNOSIS — I951 Orthostatic hypotension: Secondary | ICD-10-CM | POA: Diagnosis not present

## 2018-05-04 NOTE — Telephone Encounter (Signed)
Reviewed transmission received on 05/01/18. Available "AF" episode ECGs all show SR w/PACs, rare PVCs. No true AF noted.

## 2018-05-05 DIAGNOSIS — E785 Hyperlipidemia, unspecified: Secondary | ICD-10-CM | POA: Diagnosis not present

## 2018-05-05 DIAGNOSIS — I69354 Hemiplegia and hemiparesis following cerebral infarction affecting left non-dominant side: Secondary | ICD-10-CM | POA: Diagnosis not present

## 2018-05-05 DIAGNOSIS — R419 Unspecified symptoms and signs involving cognitive functions and awareness: Secondary | ICD-10-CM | POA: Diagnosis not present

## 2018-05-05 DIAGNOSIS — I2510 Atherosclerotic heart disease of native coronary artery without angina pectoris: Secondary | ICD-10-CM | POA: Diagnosis not present

## 2018-05-05 DIAGNOSIS — E669 Obesity, unspecified: Secondary | ICD-10-CM | POA: Diagnosis not present

## 2018-05-05 DIAGNOSIS — I110 Hypertensive heart disease with heart failure: Secondary | ICD-10-CM | POA: Diagnosis not present

## 2018-05-05 DIAGNOSIS — E1140 Type 2 diabetes mellitus with diabetic neuropathy, unspecified: Secondary | ICD-10-CM | POA: Diagnosis not present

## 2018-05-05 DIAGNOSIS — I951 Orthostatic hypotension: Secondary | ICD-10-CM | POA: Diagnosis not present

## 2018-05-05 DIAGNOSIS — I252 Old myocardial infarction: Secondary | ICD-10-CM | POA: Diagnosis not present

## 2018-05-05 DIAGNOSIS — I5032 Chronic diastolic (congestive) heart failure: Secondary | ICD-10-CM | POA: Diagnosis not present

## 2018-05-06 ENCOUNTER — Encounter: Payer: Medicare HMO | Attending: Physical Medicine & Rehabilitation | Admitting: Physical Medicine & Rehabilitation

## 2018-05-06 ENCOUNTER — Encounter: Payer: Self-pay | Admitting: Physical Medicine & Rehabilitation

## 2018-05-06 ENCOUNTER — Encounter (INDEPENDENT_AMBULATORY_CARE_PROVIDER_SITE_OTHER): Payer: Self-pay

## 2018-05-06 ENCOUNTER — Other Ambulatory Visit: Payer: Self-pay

## 2018-05-06 VITALS — BP 105/63 | HR 66 | Ht 63.0 in | Wt 173.6 lb

## 2018-05-06 DIAGNOSIS — E038 Other specified hypothyroidism: Secondary | ICD-10-CM | POA: Insufficient documentation

## 2018-05-06 DIAGNOSIS — M75101 Unspecified rotator cuff tear or rupture of right shoulder, not specified as traumatic: Secondary | ICD-10-CM

## 2018-05-06 DIAGNOSIS — G460 Middle cerebral artery syndrome: Secondary | ICD-10-CM

## 2018-05-06 DIAGNOSIS — N132 Hydronephrosis with renal and ureteral calculous obstruction: Secondary | ICD-10-CM | POA: Diagnosis not present

## 2018-05-06 DIAGNOSIS — E119 Type 2 diabetes mellitus without complications: Secondary | ICD-10-CM | POA: Insufficient documentation

## 2018-05-06 DIAGNOSIS — E785 Hyperlipidemia, unspecified: Secondary | ICD-10-CM | POA: Diagnosis not present

## 2018-05-06 DIAGNOSIS — I10 Essential (primary) hypertension: Secondary | ICD-10-CM | POA: Diagnosis not present

## 2018-05-06 NOTE — Patient Instructions (Signed)
PLEASE FEEL FREE TO CALL OUR OFFICE WITH ANY PROBLEMS OR QUESTIONS ([phone])      

## 2018-05-06 NOTE — Progress Notes (Signed)
Subjective:    Patient ID: Theresa Ray, female    DOB: Jun 21, 1945, 73 y.o.   MRN: [id number]  HPI   Theresa Ray is here for follow up of her embolic cerebral infarct she has been working with [facility] PT, OT are still coming out to her house. She is walking with her walker most often. She is not getting outside a great deal.   She was back in the hospital with a UTI  And kidney stones last week. She had nephrolithiasis to break up the stone.  Urology continues to follow her stones.  Apparently she may have a fragment still left in the ureter.  Only symptoms that she is having residual are nausea and occasional vomiting.  She has a chronic right rotator cuff tear which apparently was followed by orthopedics in the past.  The patient herself feels that the shoulder is is much a problem is anything.  She tells me that therapy is really not specifically dealing with it at this point.  I asked her if she had an injection before and she thinks that she may have had in the remote past.  Has been sometime since any imaging was done.  She does not have any home exercises either.  Husband states that cognition is improving.  Patient demonstrating improved insight and memory.  Appetite is been good.  She is moving her bowels.    Pain Inventory Average Pain 0 Pain Right Now 0 My pain is none  In the last 24 hours, has pain interfered with the following? General activity 2 Relation with others 2 Enjoyment of life 4 What TIME of day is your pain at its worst? night Sleep (in general) Fair  Pain is worse with: bending Pain improves with: medication Relief from Meds: 8  Mobility walk with assistance use a walker how many minutes can you walk? 5 ability to climb steps?  yes do you drive?  no use a wheelchair needs help with transfers  Function retired I need assistance with the following:  bathing and toileting  Neuro/Psych bladder control problems weakness tremor trouble  walking dizziness depression  Prior Studies Any changes since last visit?  yes x-rays  Physicians involved in your care Any changes since last visit?  no Primary care Yetta Barre   Family History  Problem Relation Age of Onset  . Heart disease Mother 20       CABG  . CVA Father    Social History   Socioeconomic History  . Marital status: Married    Spouse name: Not on file  . Number of children: Not on file  . Years of education: Not on file  . Highest education level: Not on file  Occupational History  . Not on file  Social Needs  . Financial resource strain: Not on file  . Food insecurity:    Worry: Not on file    Inability: Not on file  . Transportation needs:    Medical: Not on file    Non-medical: Not on file  Tobacco Use  . Smoking status: Former Smoker    Types: Cigarettes    Start date: 1967    Last attempt to quit: 1975    Years since quitting: 45.1  . Smokeless tobacco: Never Used  Substance and Sexual Activity  . Alcohol use: No    Frequency: Never  . Drug use: No  . Sexual activity: Not on file  Lifestyle  . Physical activity:    Days  per week: Not on file    Minutes per session: Not on file  . Stress: Not on file  Relationships  . Social connections:    Talks on phone: Not on file    Gets together: Not on file    Attends religious service: Not on file    Active member of club or organization: Not on file    Attends meetings of clubs or organizations: Not on file    Relationship status: Not on file  Other Topics Concern  . Not on file  Social History Narrative  . Not on file   Past Surgical History:  Procedure Laterality Date  . ABDOMINAL HYSTERECTOMY    . CORONARY ARTERY BYPASS GRAFT N/A 02/24/2017   Procedure: CORONARY ARTERY BYPASS GRAFTING (CABG) USING LEFT INTERNAL MAMMARY ARTERY TO LAD AND ENDOSCOPICALLY HARVESTED GREATER SAPHENOUS VEIN TO PDA AND TO OM1.;  Surgeon: Grace Isaac, MD;  Location: [place];  Service: Open Heart  Surgery;  Laterality: N/A;  . ENDOVEIN HARVEST OF GREATER SAPHENOUS VEIN Right 02/24/2017   Procedure: ENDOVEIN HARVEST OF GREATER SAPHENOUS VEIN;  Surgeon: Grace Isaac, MD;  Location: [place];  Service: Open Heart Surgery;  Laterality: Right;  . LAPAROSCOPIC GASTRIC BANDING    . LEFT HEART CATH AND CORONARY ANGIOGRAPHY N/A 02/18/2017   Procedure: LEFT HEART CATH AND CORONARY ANGIOGRAPHY;  Surgeon: Leonie Man, MD;  Location: [place] [facility];  Service: Cardiovascular;  Laterality: N/A;  . LOOP RECORDER INSERTION N/A 03/06/2018   Procedure: LOOP RECORDER INSERTION;  Surgeon: Constance Haw, MD;  Location: [place] [facility];  Service: Cardiovascular;  Laterality: N/A;  . TEE WITHOUT CARDIOVERSION N/A 02/24/2017   Procedure: TRANSESOPHAGEAL ECHOCARDIOGRAM (TEE);  Surgeon: Grace Isaac, MD;  Location: [place];  Service: Open Heart Surgery;  Laterality: N/A;  . TEE WITHOUT CARDIOVERSION N/A 03/06/2018   Procedure: TRANSESOPHAGEAL ECHOCARDIOGRAM (TEE);  Surgeon: Dorothy Spark, MD;  Location: [facility] ENDOSCOPY;  Service: Cardiovascular;  Laterality: N/A;  loop   Past Medical History:  Diagnosis Date  . CAD in native artery    a. s/p CABGx3 in 01/2017.  Marland Kitchen Chronic diastolic CHF (congestive heart failure) ([place])   . Diabetes mellitus ([place])   . Diabetic neuropathy ([place])   . History of kidney stones    history of stones  . Hypertension   . Hypothyroidism   . NSTEMI (non-ST elevated myocardial infarction) ([place])   . Orthostatic hypotension   . Postoperative atrial fibrillation ([place])    a. after CABG 01/2017.  Marland Kitchen TIA (transient ischemic attack)    BP 105/63   Pulse 66   Ht 5\' 3"  (1.6 m)   Wt 173 lb 9.6 oz (78.7 kg)   LMP  (LMP Unknown)   SpO2 99%   BMI 30.75 kg/m   Opioid Risk Score:   Fall Risk Score:  `1  Depression screen PHQ 2/9  Depression screen [facility] 2/9 05/06/2018 04/07/2018  Decreased Interest 3 0  Down, Depressed, Hopeless 3 0  PHQ - 2 Score 6 0  Altered  sleeping 2 1  Tired, decreased energy 3 1  Change in appetite 3 0  Feeling bad or failure about yourself  2 0  Trouble concentrating 2 1  Moving slowly or fidgety/restless 0 1  Suicidal thoughts 2 0  PHQ-9 Score 20 4  Difficult doing work/chores Not difficult at all Not difficult at all    Review of Systems  Constitutional: Negative.   Eyes: Negative.  Respiratory: Negative.   Cardiovascular: Negative.   Gastrointestinal: Positive for diarrhea and vomiting.  Endocrine: Negative.   Genitourinary: Negative.   Musculoskeletal: Negative.   Skin: Negative.   Allergic/Immunologic: Negative.   Neurological: Negative.   Hematological: Negative.   Psychiatric/Behavioral: Negative.        Objective:   Physical Exam  General: Alert and oriented x 3, No apparent distress HEENT: Head is normocephalic, atraumatic, PERRLA, EOMI, sclera anicteric, oral mucosa pink and moist, dentition intact, ext ear canals clear,  Neck: Supple without JVD or lymphadenopathy Heart: Reg rate and rhythm. No murmurs rubs or gallops Chest: CTA bilaterally without wheezes, rales, or rhonchi; no distress Abdomen: Soft, non-tender, non-distended, bowel sounds positive. Extremities: No clubbing, cyanosis, or edema. Pulses are 2+ Skin: Clean and intact without signs of breakdown Neuro: Pt is demonstrating improved insight, memory, and awareness.  Still some delays in processing cranial nerves 2-12 are intact. Sensory exam is normal. Reflexes are 2+ in all 4's. Fine motor coordination is intact. No tremors. Motor function is grossly 5 out of 5 left upper extremity left lower extremity.  Right lower right upper extremity grossly 3+ to 4 out of 5 with decreased fine motor coordination.  There was some pain in addition at the right shoulder..  Musculoskeletal: Patient is limited with abduction and internal rotation of the right shoulder.  She could not abduct past about 70 degrees without pain.  Pain along the  subacromial space.  She difficulty touching her right ear or lifting her arm over the head.  Bicipital tendons were nontender. Psych: Pt's affect is appropriate. Pt is cooperative         Assessment & Plan:  1. 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  Hypertension: 3.  Type 2 diabetes: Per primary care physician. 4.  History of hydronephrosis with urinary obstruction due to renal calculus: Patient status post double-J stents.  Urology has followed patient. 5. Right rotator cuff injury/tear  -basic RTC and shoulder ROM exercises were provided  -can address more specifically when she recovers furhter from stroke  15 minutes was spent with the patient in the office today.  I will see her back in about 2 months time.

## 2018-05-07 DIAGNOSIS — I951 Orthostatic hypotension: Secondary | ICD-10-CM | POA: Diagnosis not present

## 2018-05-07 DIAGNOSIS — R419 Unspecified symptoms and signs involving cognitive functions and awareness: Secondary | ICD-10-CM | POA: Diagnosis not present

## 2018-05-07 DIAGNOSIS — I69354 Hemiplegia and hemiparesis following cerebral infarction affecting left non-dominant side: Secondary | ICD-10-CM | POA: Diagnosis not present

## 2018-05-07 DIAGNOSIS — I2510 Atherosclerotic heart disease of native coronary artery without angina pectoris: Secondary | ICD-10-CM | POA: Diagnosis not present

## 2018-05-07 DIAGNOSIS — E1140 Type 2 diabetes mellitus with diabetic neuropathy, unspecified: Secondary | ICD-10-CM | POA: Diagnosis not present

## 2018-05-07 DIAGNOSIS — I252 Old myocardial infarction: Secondary | ICD-10-CM | POA: Diagnosis not present

## 2018-05-07 DIAGNOSIS — I5032 Chronic diastolic (congestive) heart failure: Secondary | ICD-10-CM | POA: Diagnosis not present

## 2018-05-07 DIAGNOSIS — I110 Hypertensive heart disease with heart failure: Secondary | ICD-10-CM | POA: Diagnosis not present

## 2018-05-07 DIAGNOSIS — E785 Hyperlipidemia, unspecified: Secondary | ICD-10-CM | POA: Diagnosis not present

## 2018-05-07 DIAGNOSIS — E669 Obesity, unspecified: Secondary | ICD-10-CM | POA: Diagnosis not present

## 2018-05-08 ENCOUNTER — Telehealth: Payer: Self-pay | Admitting: Cardiology

## 2018-05-08 DIAGNOSIS — N200 Calculus of kidney: Secondary | ICD-10-CM | POA: Diagnosis not present

## 2018-05-08 NOTE — Telephone Encounter (Signed)
Attempted to call pt to request a manual transmission and to ask pt if she can send a manual remote transmission weekly. No answer and unable to leave a message.

## 2018-05-11 ENCOUNTER — Ambulatory Visit (INDEPENDENT_AMBULATORY_CARE_PROVIDER_SITE_OTHER): Payer: Medicare HMO

## 2018-05-11 DIAGNOSIS — R419 Unspecified symptoms and signs involving cognitive functions and awareness: Secondary | ICD-10-CM | POA: Diagnosis not present

## 2018-05-11 DIAGNOSIS — I5032 Chronic diastolic (congestive) heart failure: Secondary | ICD-10-CM | POA: Diagnosis not present

## 2018-05-11 DIAGNOSIS — I639 Cerebral infarction, unspecified: Secondary | ICD-10-CM | POA: Diagnosis not present

## 2018-05-11 DIAGNOSIS — I69354 Hemiplegia and hemiparesis following cerebral infarction affecting left non-dominant side: Secondary | ICD-10-CM | POA: Diagnosis not present

## 2018-05-11 DIAGNOSIS — E1140 Type 2 diabetes mellitus with diabetic neuropathy, unspecified: Secondary | ICD-10-CM | POA: Diagnosis not present

## 2018-05-11 DIAGNOSIS — I110 Hypertensive heart disease with heart failure: Secondary | ICD-10-CM | POA: Diagnosis not present

## 2018-05-11 DIAGNOSIS — E669 Obesity, unspecified: Secondary | ICD-10-CM | POA: Diagnosis not present

## 2018-05-11 DIAGNOSIS — I951 Orthostatic hypotension: Secondary | ICD-10-CM | POA: Diagnosis not present

## 2018-05-11 DIAGNOSIS — E785 Hyperlipidemia, unspecified: Secondary | ICD-10-CM | POA: Diagnosis not present

## 2018-05-11 DIAGNOSIS — I252 Old myocardial infarction: Secondary | ICD-10-CM | POA: Diagnosis not present

## 2018-05-11 DIAGNOSIS — I2510 Atherosclerotic heart disease of native coronary artery without angina pectoris: Secondary | ICD-10-CM | POA: Diagnosis not present

## 2018-05-11 NOTE — Telephone Encounter (Signed)
Manual transmission received. Will review with Dr. Lovena Le tomorrow.

## 2018-05-11 NOTE — Telephone Encounter (Signed)
Spoke w/ pt and requested that she send a manual transmission w/ her home monitor. Pt stated that her husband will do it when he gets home.

## 2018-05-12 LAB — CUP PACEART REMOTE DEVICE CHECK
Date Time Interrogation Session: 20200210210027
Implantable Pulse Generator Implant Date: 20191206

## 2018-05-13 DIAGNOSIS — I110 Hypertensive heart disease with heart failure: Secondary | ICD-10-CM | POA: Diagnosis not present

## 2018-05-13 DIAGNOSIS — I2510 Atherosclerotic heart disease of native coronary artery without angina pectoris: Secondary | ICD-10-CM | POA: Diagnosis not present

## 2018-05-13 DIAGNOSIS — R419 Unspecified symptoms and signs involving cognitive functions and awareness: Secondary | ICD-10-CM | POA: Diagnosis not present

## 2018-05-13 DIAGNOSIS — I951 Orthostatic hypotension: Secondary | ICD-10-CM | POA: Diagnosis not present

## 2018-05-13 DIAGNOSIS — I5032 Chronic diastolic (congestive) heart failure: Secondary | ICD-10-CM | POA: Diagnosis not present

## 2018-05-13 DIAGNOSIS — E669 Obesity, unspecified: Secondary | ICD-10-CM | POA: Diagnosis not present

## 2018-05-13 DIAGNOSIS — E1140 Type 2 diabetes mellitus with diabetic neuropathy, unspecified: Secondary | ICD-10-CM | POA: Diagnosis not present

## 2018-05-13 DIAGNOSIS — I252 Old myocardial infarction: Secondary | ICD-10-CM | POA: Diagnosis not present

## 2018-05-13 DIAGNOSIS — E785 Hyperlipidemia, unspecified: Secondary | ICD-10-CM | POA: Diagnosis not present

## 2018-05-13 DIAGNOSIS — I69354 Hemiplegia and hemiparesis following cerebral infarction affecting left non-dominant side: Secondary | ICD-10-CM | POA: Diagnosis not present

## 2018-05-13 NOTE — Telephone Encounter (Signed)
Spoke with pt and she agreed to send a manual transmission as soon as Mallie Mussel gets home.

## 2018-05-14 DIAGNOSIS — R419 Unspecified symptoms and signs involving cognitive functions and awareness: Secondary | ICD-10-CM | POA: Diagnosis not present

## 2018-05-14 DIAGNOSIS — I110 Hypertensive heart disease with heart failure: Secondary | ICD-10-CM | POA: Diagnosis not present

## 2018-05-14 DIAGNOSIS — I2510 Atherosclerotic heart disease of native coronary artery without angina pectoris: Secondary | ICD-10-CM | POA: Diagnosis not present

## 2018-05-14 DIAGNOSIS — E1140 Type 2 diabetes mellitus with diabetic neuropathy, unspecified: Secondary | ICD-10-CM | POA: Diagnosis not present

## 2018-05-14 DIAGNOSIS — I252 Old myocardial infarction: Secondary | ICD-10-CM | POA: Diagnosis not present

## 2018-05-14 DIAGNOSIS — I5032 Chronic diastolic (congestive) heart failure: Secondary | ICD-10-CM | POA: Diagnosis not present

## 2018-05-14 DIAGNOSIS — I951 Orthostatic hypotension: Secondary | ICD-10-CM | POA: Diagnosis not present

## 2018-05-14 DIAGNOSIS — I69354 Hemiplegia and hemiparesis following cerebral infarction affecting left non-dominant side: Secondary | ICD-10-CM | POA: Diagnosis not present

## 2018-05-14 DIAGNOSIS — N201 Calculus of ureter: Secondary | ICD-10-CM | POA: Diagnosis not present

## 2018-05-14 DIAGNOSIS — E669 Obesity, unspecified: Secondary | ICD-10-CM | POA: Diagnosis not present

## 2018-05-14 DIAGNOSIS — E785 Hyperlipidemia, unspecified: Secondary | ICD-10-CM | POA: Diagnosis not present

## 2018-05-14 NOTE — Telephone Encounter (Signed)
Manual transmission received and reviewed. ECGs appear SR w/ectopy and noise, but will review with MD for any recommendations.

## 2018-05-15 DIAGNOSIS — I6930 Unspecified sequelae of cerebral infarction: Secondary | ICD-10-CM | POA: Diagnosis not present

## 2018-05-15 DIAGNOSIS — Z6830 Body mass index (BMI) 30.0-30.9, adult: Secondary | ICD-10-CM | POA: Diagnosis not present

## 2018-05-15 DIAGNOSIS — N200 Calculus of kidney: Secondary | ICD-10-CM | POA: Diagnosis not present

## 2018-05-15 NOTE — Telephone Encounter (Signed)
LMOM requesting call back to the [facility]. Dr. Curt Bears reviewed episodes, agrees they show SR w/ectopy and occasional noise. Per Dr. Curt Bears, plan to bring patient into the DC for reprogramming. Reprogram AF detection to "less sensitive" and extend episode storage to episodes >= 78min.  Per [employer], ok to schedule on 05/27/18 DC schedule if patient is available.

## 2018-05-18 DIAGNOSIS — I69354 Hemiplegia and hemiparesis following cerebral infarction affecting left non-dominant side: Secondary | ICD-10-CM | POA: Diagnosis not present

## 2018-05-18 DIAGNOSIS — E785 Hyperlipidemia, unspecified: Secondary | ICD-10-CM | POA: Diagnosis not present

## 2018-05-18 DIAGNOSIS — I252 Old myocardial infarction: Secondary | ICD-10-CM | POA: Diagnosis not present

## 2018-05-18 DIAGNOSIS — E669 Obesity, unspecified: Secondary | ICD-10-CM | POA: Diagnosis not present

## 2018-05-18 DIAGNOSIS — I951 Orthostatic hypotension: Secondary | ICD-10-CM | POA: Diagnosis not present

## 2018-05-18 DIAGNOSIS — R419 Unspecified symptoms and signs involving cognitive functions and awareness: Secondary | ICD-10-CM | POA: Diagnosis not present

## 2018-05-18 DIAGNOSIS — I110 Hypertensive heart disease with heart failure: Secondary | ICD-10-CM | POA: Diagnosis not present

## 2018-05-18 DIAGNOSIS — E1140 Type 2 diabetes mellitus with diabetic neuropathy, unspecified: Secondary | ICD-10-CM | POA: Diagnosis not present

## 2018-05-18 DIAGNOSIS — I5032 Chronic diastolic (congestive) heart failure: Secondary | ICD-10-CM | POA: Diagnosis not present

## 2018-05-18 DIAGNOSIS — I2510 Atherosclerotic heart disease of native coronary artery without angina pectoris: Secondary | ICD-10-CM | POA: Diagnosis not present

## 2018-05-19 ENCOUNTER — Telehealth: Payer: Self-pay

## 2018-05-19 NOTE — Telephone Encounter (Signed)
Spoke with patient and she agreed to send a manual transmission 

## 2018-05-19 NOTE — Telephone Encounter (Signed)
Transmission received AF episodes false

## 2018-05-20 ENCOUNTER — Other Ambulatory Visit: Payer: Self-pay | Admitting: Cardiology

## 2018-05-20 DIAGNOSIS — I110 Hypertensive heart disease with heart failure: Secondary | ICD-10-CM | POA: Diagnosis not present

## 2018-05-20 DIAGNOSIS — R419 Unspecified symptoms and signs involving cognitive functions and awareness: Secondary | ICD-10-CM | POA: Diagnosis not present

## 2018-05-20 DIAGNOSIS — E1140 Type 2 diabetes mellitus with diabetic neuropathy, unspecified: Secondary | ICD-10-CM | POA: Diagnosis not present

## 2018-05-20 DIAGNOSIS — I951 Orthostatic hypotension: Secondary | ICD-10-CM | POA: Diagnosis not present

## 2018-05-20 DIAGNOSIS — I5032 Chronic diastolic (congestive) heart failure: Secondary | ICD-10-CM | POA: Diagnosis not present

## 2018-05-20 DIAGNOSIS — I2510 Atherosclerotic heart disease of native coronary artery without angina pectoris: Secondary | ICD-10-CM | POA: Diagnosis not present

## 2018-05-20 DIAGNOSIS — E669 Obesity, unspecified: Secondary | ICD-10-CM | POA: Diagnosis not present

## 2018-05-20 DIAGNOSIS — I252 Old myocardial infarction: Secondary | ICD-10-CM | POA: Diagnosis not present

## 2018-05-20 DIAGNOSIS — E785 Hyperlipidemia, unspecified: Secondary | ICD-10-CM | POA: Diagnosis not present

## 2018-05-20 DIAGNOSIS — I69354 Hemiplegia and hemiparesis following cerebral infarction affecting left non-dominant side: Secondary | ICD-10-CM | POA: Diagnosis not present

## 2018-05-21 DIAGNOSIS — E119 Type 2 diabetes mellitus without complications: Secondary | ICD-10-CM | POA: Diagnosis not present

## 2018-05-21 DIAGNOSIS — N201 Calculus of ureter: Secondary | ICD-10-CM | POA: Diagnosis not present

## 2018-05-21 DIAGNOSIS — N139 Obstructive and reflux uropathy, unspecified: Secondary | ICD-10-CM | POA: Diagnosis not present

## 2018-05-21 DIAGNOSIS — J45909 Unspecified asthma, uncomplicated: Secondary | ICD-10-CM | POA: Diagnosis not present

## 2018-05-21 DIAGNOSIS — N3020 Other chronic cystitis without hematuria: Secondary | ICD-10-CM | POA: Diagnosis not present

## 2018-05-21 DIAGNOSIS — I10 Essential (primary) hypertension: Secondary | ICD-10-CM | POA: Diagnosis not present

## 2018-05-23 DIAGNOSIS — I110 Hypertensive heart disease with heart failure: Secondary | ICD-10-CM | POA: Diagnosis not present

## 2018-05-23 DIAGNOSIS — I69354 Hemiplegia and hemiparesis following cerebral infarction affecting left non-dominant side: Secondary | ICD-10-CM | POA: Diagnosis not present

## 2018-05-25 ENCOUNTER — Telehealth: Payer: Self-pay

## 2018-05-25 DIAGNOSIS — I5032 Chronic diastolic (congestive) heart failure: Secondary | ICD-10-CM | POA: Diagnosis not present

## 2018-05-25 DIAGNOSIS — I2510 Atherosclerotic heart disease of native coronary artery without angina pectoris: Secondary | ICD-10-CM | POA: Diagnosis not present

## 2018-05-25 DIAGNOSIS — E785 Hyperlipidemia, unspecified: Secondary | ICD-10-CM | POA: Diagnosis not present

## 2018-05-25 DIAGNOSIS — I69354 Hemiplegia and hemiparesis following cerebral infarction affecting left non-dominant side: Secondary | ICD-10-CM | POA: Diagnosis not present

## 2018-05-25 DIAGNOSIS — I110 Hypertensive heart disease with heart failure: Secondary | ICD-10-CM | POA: Diagnosis not present

## 2018-05-25 DIAGNOSIS — E669 Obesity, unspecified: Secondary | ICD-10-CM | POA: Diagnosis not present

## 2018-05-25 DIAGNOSIS — I252 Old myocardial infarction: Secondary | ICD-10-CM | POA: Diagnosis not present

## 2018-05-25 DIAGNOSIS — I951 Orthostatic hypotension: Secondary | ICD-10-CM | POA: Diagnosis not present

## 2018-05-25 DIAGNOSIS — R419 Unspecified symptoms and signs involving cognitive functions and awareness: Secondary | ICD-10-CM | POA: Diagnosis not present

## 2018-05-25 DIAGNOSIS — E1140 Type 2 diabetes mellitus with diabetic neuropathy, unspecified: Secondary | ICD-10-CM | POA: Diagnosis not present

## 2018-05-25 NOTE — Telephone Encounter (Signed)
I left a message on pt voicemail to send a manual transmission.

## 2018-05-25 NOTE — Progress Notes (Signed)
Carelink Summary Report / Loop Recorder 

## 2018-05-26 NOTE — Telephone Encounter (Signed)
Manual transmission received. Available "AF" episodes appear false. Pt is scheduled for DC appt for LINQ reprogramming tomorrow, 05/27/18.

## 2018-05-27 ENCOUNTER — Ambulatory Visit (INDEPENDENT_AMBULATORY_CARE_PROVIDER_SITE_OTHER): Payer: Medicare HMO | Admitting: Nurse Practitioner

## 2018-05-27 DIAGNOSIS — I639 Cerebral infarction, unspecified: Secondary | ICD-10-CM

## 2018-05-27 LAB — CUP PACEART INCLINIC DEVICE CHECK
Date Time Interrogation Session: 20200226113005
Implantable Pulse Generator Implant Date: 20191206

## 2018-05-27 NOTE — Patient Instructions (Signed)
Medication Instructions:  none If you need a refill on your cardiac medications before your next appointment, please call your pharmacy.   Lab work: none If you have labs (blood work) drawn today and your tests are completely normal, you will receive your results only by: Marland Kitchen MyChart Message (if you have MyChart) OR . A paper copy in the mail If you have any lab test that is abnormal or we need to change your treatment, we will call you to review the results.  Testing/Procedures: none

## 2018-05-27 NOTE — Progress Notes (Signed)
ILR check in clinic. AF detection reprogrammed to less sensitive and EGM storage to episodes >6 minutes

## 2018-05-29 ENCOUNTER — Telehealth: Payer: Self-pay

## 2018-05-29 NOTE — Telephone Encounter (Signed)
I spoke with the patient advised her this may be the last time she has to send a manual transmission. We needed the transmission just to update the changes that was made in office. Pt agreed to send a manual transmission tonight.

## 2018-06-01 DIAGNOSIS — N132 Hydronephrosis with renal and ureteral calculous obstruction: Secondary | ICD-10-CM | POA: Diagnosis not present

## 2018-06-01 DIAGNOSIS — R404 Transient alteration of awareness: Secondary | ICD-10-CM | POA: Diagnosis not present

## 2018-06-01 DIAGNOSIS — I499 Cardiac arrhythmia, unspecified: Secondary | ICD-10-CM | POA: Diagnosis not present

## 2018-06-01 DIAGNOSIS — B9689 Other specified bacterial agents as the cause of diseases classified elsewhere: Secondary | ICD-10-CM | POA: Diagnosis not present

## 2018-06-01 DIAGNOSIS — S0990XA Unspecified injury of head, initial encounter: Secondary | ICD-10-CM | POA: Diagnosis not present

## 2018-06-01 DIAGNOSIS — R410 Disorientation, unspecified: Secondary | ICD-10-CM | POA: Diagnosis not present

## 2018-06-01 DIAGNOSIS — N201 Calculus of ureter: Secondary | ICD-10-CM | POA: Diagnosis not present

## 2018-06-01 DIAGNOSIS — S199XXA Unspecified injury of neck, initial encounter: Secondary | ICD-10-CM | POA: Diagnosis not present

## 2018-06-01 DIAGNOSIS — R0902 Hypoxemia: Secondary | ICD-10-CM | POA: Diagnosis not present

## 2018-06-01 DIAGNOSIS — N390 Urinary tract infection, site not specified: Secondary | ICD-10-CM | POA: Diagnosis not present

## 2018-06-01 DIAGNOSIS — R4182 Altered mental status, unspecified: Secondary | ICD-10-CM | POA: Diagnosis not present

## 2018-06-01 DIAGNOSIS — R000 Tachycardia, unspecified: Secondary | ICD-10-CM | POA: Diagnosis not present

## 2018-06-01 NOTE — Telephone Encounter (Signed)
Transmission received. 2 AF episodes, both false.  Chanetta Marshall, NP 06/01/2018 10:49 AM

## 2018-06-01 NOTE — Telephone Encounter (Signed)
Spoke w/ pt and requested that she send a manual transmission w/ her home monitor to complete update that was done during office visit w/ Chanetta Marshall, NP. Pt verbalized understanding an said she would do this later today.

## 2018-06-02 DIAGNOSIS — I10 Essential (primary) hypertension: Secondary | ICD-10-CM | POA: Diagnosis not present

## 2018-06-02 DIAGNOSIS — I110 Hypertensive heart disease with heart failure: Secondary | ICD-10-CM | POA: Diagnosis not present

## 2018-06-02 DIAGNOSIS — S0990XA Unspecified injury of head, initial encounter: Secondary | ICD-10-CM | POA: Diagnosis not present

## 2018-06-02 DIAGNOSIS — E785 Hyperlipidemia, unspecified: Secondary | ICD-10-CM | POA: Diagnosis not present

## 2018-06-02 DIAGNOSIS — N390 Urinary tract infection, site not specified: Secondary | ICD-10-CM | POA: Diagnosis not present

## 2018-06-02 DIAGNOSIS — E039 Hypothyroidism, unspecified: Secondary | ICD-10-CM | POA: Diagnosis not present

## 2018-06-02 DIAGNOSIS — J449 Chronic obstructive pulmonary disease, unspecified: Secondary | ICD-10-CM | POA: Diagnosis not present

## 2018-06-02 DIAGNOSIS — S199XXA Unspecified injury of neck, initial encounter: Secondary | ICD-10-CM | POA: Diagnosis not present

## 2018-06-02 DIAGNOSIS — N132 Hydronephrosis with renal and ureteral calculous obstruction: Secondary | ICD-10-CM | POA: Diagnosis not present

## 2018-06-02 DIAGNOSIS — N136 Pyonephrosis: Secondary | ICD-10-CM | POA: Diagnosis not present

## 2018-06-02 DIAGNOSIS — R69 Illness, unspecified: Secondary | ICD-10-CM | POA: Diagnosis not present

## 2018-06-02 DIAGNOSIS — N139 Obstructive and reflux uropathy, unspecified: Secondary | ICD-10-CM | POA: Diagnosis not present

## 2018-06-02 DIAGNOSIS — R4182 Altered mental status, unspecified: Secondary | ICD-10-CM | POA: Diagnosis not present

## 2018-06-02 DIAGNOSIS — I252 Old myocardial infarction: Secondary | ICD-10-CM | POA: Diagnosis not present

## 2018-06-02 DIAGNOSIS — E119 Type 2 diabetes mellitus without complications: Secondary | ICD-10-CM | POA: Diagnosis not present

## 2018-06-02 DIAGNOSIS — B9689 Other specified bacterial agents as the cause of diseases classified elsewhere: Secondary | ICD-10-CM | POA: Diagnosis not present

## 2018-06-02 DIAGNOSIS — N201 Calculus of ureter: Secondary | ICD-10-CM | POA: Diagnosis not present

## 2018-06-02 DIAGNOSIS — E876 Hypokalemia: Secondary | ICD-10-CM | POA: Diagnosis not present

## 2018-06-02 DIAGNOSIS — I2510 Atherosclerotic heart disease of native coronary artery without angina pectoris: Secondary | ICD-10-CM | POA: Diagnosis not present

## 2018-06-02 DIAGNOSIS — G9341 Metabolic encephalopathy: Secondary | ICD-10-CM | POA: Diagnosis not present

## 2018-06-02 DIAGNOSIS — I5032 Chronic diastolic (congestive) heart failure: Secondary | ICD-10-CM | POA: Diagnosis not present

## 2018-06-03 DIAGNOSIS — N201 Calculus of ureter: Secondary | ICD-10-CM | POA: Diagnosis not present

## 2018-06-05 DIAGNOSIS — I5032 Chronic diastolic (congestive) heart failure: Secondary | ICD-10-CM | POA: Diagnosis not present

## 2018-06-05 DIAGNOSIS — I2510 Atherosclerotic heart disease of native coronary artery without angina pectoris: Secondary | ICD-10-CM | POA: Diagnosis not present

## 2018-06-09 DIAGNOSIS — Z48816 Encounter for surgical aftercare following surgery on the genitourinary system: Secondary | ICD-10-CM | POA: Diagnosis not present

## 2018-06-09 DIAGNOSIS — N1330 Unspecified hydronephrosis: Secondary | ICD-10-CM | POA: Diagnosis not present

## 2018-06-09 DIAGNOSIS — I252 Old myocardial infarction: Secondary | ICD-10-CM | POA: Diagnosis not present

## 2018-06-09 DIAGNOSIS — I5030 Unspecified diastolic (congestive) heart failure: Secondary | ICD-10-CM | POA: Diagnosis not present

## 2018-06-09 DIAGNOSIS — I110 Hypertensive heart disease with heart failure: Secondary | ICD-10-CM | POA: Diagnosis not present

## 2018-06-09 DIAGNOSIS — J449 Chronic obstructive pulmonary disease, unspecified: Secondary | ICD-10-CM | POA: Diagnosis not present

## 2018-06-09 DIAGNOSIS — R2689 Other abnormalities of gait and mobility: Secondary | ICD-10-CM | POA: Diagnosis not present

## 2018-06-09 DIAGNOSIS — R531 Weakness: Secondary | ICD-10-CM | POA: Diagnosis not present

## 2018-06-09 DIAGNOSIS — I2510 Atherosclerotic heart disease of native coronary artery without angina pectoris: Secondary | ICD-10-CM | POA: Diagnosis not present

## 2018-06-09 DIAGNOSIS — E785 Hyperlipidemia, unspecified: Secondary | ICD-10-CM | POA: Diagnosis not present

## 2018-06-10 DIAGNOSIS — J449 Chronic obstructive pulmonary disease, unspecified: Secondary | ICD-10-CM | POA: Diagnosis not present

## 2018-06-10 DIAGNOSIS — R2689 Other abnormalities of gait and mobility: Secondary | ICD-10-CM | POA: Diagnosis not present

## 2018-06-10 DIAGNOSIS — Z48816 Encounter for surgical aftercare following surgery on the genitourinary system: Secondary | ICD-10-CM | POA: Diagnosis not present

## 2018-06-10 DIAGNOSIS — N1330 Unspecified hydronephrosis: Secondary | ICD-10-CM | POA: Diagnosis not present

## 2018-06-10 DIAGNOSIS — I110 Hypertensive heart disease with heart failure: Secondary | ICD-10-CM | POA: Diagnosis not present

## 2018-06-10 DIAGNOSIS — E785 Hyperlipidemia, unspecified: Secondary | ICD-10-CM | POA: Diagnosis not present

## 2018-06-10 DIAGNOSIS — I2510 Atherosclerotic heart disease of native coronary artery without angina pectoris: Secondary | ICD-10-CM | POA: Diagnosis not present

## 2018-06-10 DIAGNOSIS — I5030 Unspecified diastolic (congestive) heart failure: Secondary | ICD-10-CM | POA: Diagnosis not present

## 2018-06-10 DIAGNOSIS — I252 Old myocardial infarction: Secondary | ICD-10-CM | POA: Diagnosis not present

## 2018-06-10 DIAGNOSIS — R531 Weakness: Secondary | ICD-10-CM | POA: Diagnosis not present

## 2018-06-11 DIAGNOSIS — E876 Hypokalemia: Secondary | ICD-10-CM | POA: Diagnosis not present

## 2018-06-11 DIAGNOSIS — Z6829 Body mass index (BMI) 29.0-29.9, adult: Secondary | ICD-10-CM | POA: Diagnosis not present

## 2018-06-11 DIAGNOSIS — Z48816 Encounter for surgical aftercare following surgery on the genitourinary system: Secondary | ICD-10-CM | POA: Diagnosis not present

## 2018-06-11 DIAGNOSIS — E785 Hyperlipidemia, unspecified: Secondary | ICD-10-CM | POA: Diagnosis not present

## 2018-06-11 DIAGNOSIS — I5030 Unspecified diastolic (congestive) heart failure: Secondary | ICD-10-CM | POA: Diagnosis not present

## 2018-06-11 DIAGNOSIS — J449 Chronic obstructive pulmonary disease, unspecified: Secondary | ICD-10-CM | POA: Diagnosis not present

## 2018-06-11 DIAGNOSIS — I110 Hypertensive heart disease with heart failure: Secondary | ICD-10-CM | POA: Diagnosis not present

## 2018-06-11 DIAGNOSIS — E119 Type 2 diabetes mellitus without complications: Secondary | ICD-10-CM | POA: Diagnosis not present

## 2018-06-11 DIAGNOSIS — R2689 Other abnormalities of gait and mobility: Secondary | ICD-10-CM | POA: Diagnosis not present

## 2018-06-11 DIAGNOSIS — I2510 Atherosclerotic heart disease of native coronary artery without angina pectoris: Secondary | ICD-10-CM | POA: Diagnosis not present

## 2018-06-11 DIAGNOSIS — I252 Old myocardial infarction: Secondary | ICD-10-CM | POA: Diagnosis not present

## 2018-06-11 DIAGNOSIS — I10 Essential (primary) hypertension: Secondary | ICD-10-CM | POA: Diagnosis not present

## 2018-06-11 DIAGNOSIS — N390 Urinary tract infection, site not specified: Secondary | ICD-10-CM | POA: Diagnosis not present

## 2018-06-11 DIAGNOSIS — E039 Hypothyroidism, unspecified: Secondary | ICD-10-CM | POA: Diagnosis not present

## 2018-06-11 DIAGNOSIS — R531 Weakness: Secondary | ICD-10-CM | POA: Diagnosis not present

## 2018-06-11 DIAGNOSIS — N1330 Unspecified hydronephrosis: Secondary | ICD-10-CM | POA: Diagnosis not present

## 2018-06-11 DIAGNOSIS — E7800 Pure hypercholesterolemia, unspecified: Secondary | ICD-10-CM | POA: Diagnosis not present

## 2018-06-12 DIAGNOSIS — N3090 Cystitis, unspecified without hematuria: Secondary | ICD-10-CM | POA: Diagnosis not present

## 2018-06-12 DIAGNOSIS — N201 Calculus of ureter: Secondary | ICD-10-CM | POA: Diagnosis not present

## 2018-06-12 DIAGNOSIS — N3020 Other chronic cystitis without hematuria: Secondary | ICD-10-CM | POA: Diagnosis not present

## 2018-06-13 DIAGNOSIS — I5030 Unspecified diastolic (congestive) heart failure: Secondary | ICD-10-CM | POA: Diagnosis not present

## 2018-06-13 DIAGNOSIS — I110 Hypertensive heart disease with heart failure: Secondary | ICD-10-CM | POA: Diagnosis not present

## 2018-06-13 DIAGNOSIS — I2510 Atherosclerotic heart disease of native coronary artery without angina pectoris: Secondary | ICD-10-CM | POA: Diagnosis not present

## 2018-06-13 DIAGNOSIS — R531 Weakness: Secondary | ICD-10-CM | POA: Diagnosis not present

## 2018-06-13 DIAGNOSIS — E785 Hyperlipidemia, unspecified: Secondary | ICD-10-CM | POA: Diagnosis not present

## 2018-06-13 DIAGNOSIS — N1330 Unspecified hydronephrosis: Secondary | ICD-10-CM | POA: Diagnosis not present

## 2018-06-13 DIAGNOSIS — Z48816 Encounter for surgical aftercare following surgery on the genitourinary system: Secondary | ICD-10-CM | POA: Diagnosis not present

## 2018-06-13 DIAGNOSIS — R2689 Other abnormalities of gait and mobility: Secondary | ICD-10-CM | POA: Diagnosis not present

## 2018-06-13 DIAGNOSIS — I252 Old myocardial infarction: Secondary | ICD-10-CM | POA: Diagnosis not present

## 2018-06-13 DIAGNOSIS — J449 Chronic obstructive pulmonary disease, unspecified: Secondary | ICD-10-CM | POA: Diagnosis not present

## 2018-06-15 ENCOUNTER — Other Ambulatory Visit: Payer: Self-pay

## 2018-06-15 ENCOUNTER — Ambulatory Visit (INDEPENDENT_AMBULATORY_CARE_PROVIDER_SITE_OTHER): Payer: Medicare HMO | Admitting: *Deleted

## 2018-06-15 DIAGNOSIS — I110 Hypertensive heart disease with heart failure: Secondary | ICD-10-CM | POA: Diagnosis not present

## 2018-06-15 DIAGNOSIS — I639 Cerebral infarction, unspecified: Secondary | ICD-10-CM | POA: Diagnosis not present

## 2018-06-15 DIAGNOSIS — N1330 Unspecified hydronephrosis: Secondary | ICD-10-CM | POA: Diagnosis not present

## 2018-06-15 DIAGNOSIS — Z48816 Encounter for surgical aftercare following surgery on the genitourinary system: Secondary | ICD-10-CM | POA: Diagnosis not present

## 2018-06-15 DIAGNOSIS — I2510 Atherosclerotic heart disease of native coronary artery without angina pectoris: Secondary | ICD-10-CM | POA: Diagnosis not present

## 2018-06-15 DIAGNOSIS — R531 Weakness: Secondary | ICD-10-CM | POA: Diagnosis not present

## 2018-06-15 DIAGNOSIS — R2689 Other abnormalities of gait and mobility: Secondary | ICD-10-CM | POA: Diagnosis not present

## 2018-06-15 DIAGNOSIS — I252 Old myocardial infarction: Secondary | ICD-10-CM | POA: Diagnosis not present

## 2018-06-15 DIAGNOSIS — E785 Hyperlipidemia, unspecified: Secondary | ICD-10-CM | POA: Diagnosis not present

## 2018-06-15 DIAGNOSIS — I5030 Unspecified diastolic (congestive) heart failure: Secondary | ICD-10-CM | POA: Diagnosis not present

## 2018-06-15 DIAGNOSIS — J449 Chronic obstructive pulmonary disease, unspecified: Secondary | ICD-10-CM | POA: Diagnosis not present

## 2018-06-16 LAB — CUP PACEART REMOTE DEVICE CHECK
Date Time Interrogation Session: 20200314214239
Implantable Pulse Generator Implant Date: 20191206

## 2018-06-21 DIAGNOSIS — I110 Hypertensive heart disease with heart failure: Secondary | ICD-10-CM | POA: Diagnosis not present

## 2018-06-21 DIAGNOSIS — I69354 Hemiplegia and hemiparesis following cerebral infarction affecting left non-dominant side: Secondary | ICD-10-CM | POA: Diagnosis not present

## 2018-06-22 DIAGNOSIS — R2689 Other abnormalities of gait and mobility: Secondary | ICD-10-CM | POA: Diagnosis not present

## 2018-06-22 DIAGNOSIS — J449 Chronic obstructive pulmonary disease, unspecified: Secondary | ICD-10-CM | POA: Diagnosis not present

## 2018-06-22 DIAGNOSIS — I252 Old myocardial infarction: Secondary | ICD-10-CM | POA: Diagnosis not present

## 2018-06-22 DIAGNOSIS — Z48816 Encounter for surgical aftercare following surgery on the genitourinary system: Secondary | ICD-10-CM | POA: Diagnosis not present

## 2018-06-22 DIAGNOSIS — I5030 Unspecified diastolic (congestive) heart failure: Secondary | ICD-10-CM | POA: Diagnosis not present

## 2018-06-22 DIAGNOSIS — N1330 Unspecified hydronephrosis: Secondary | ICD-10-CM | POA: Diagnosis not present

## 2018-06-22 DIAGNOSIS — I2510 Atherosclerotic heart disease of native coronary artery without angina pectoris: Secondary | ICD-10-CM | POA: Diagnosis not present

## 2018-06-22 DIAGNOSIS — R531 Weakness: Secondary | ICD-10-CM | POA: Diagnosis not present

## 2018-06-22 DIAGNOSIS — E785 Hyperlipidemia, unspecified: Secondary | ICD-10-CM | POA: Diagnosis not present

## 2018-06-22 DIAGNOSIS — I110 Hypertensive heart disease with heart failure: Secondary | ICD-10-CM | POA: Diagnosis not present

## 2018-06-23 ENCOUNTER — Telehealth: Payer: Self-pay | Admitting: Cardiovascular Disease

## 2018-06-23 NOTE — Telephone Encounter (Signed)
I spoke to the patient this am. She is doing well . NO chest pain or dyspnea. She does not wish to be seen this week. No refills needed. She will need to be seen for routine follow up to be scheduled after Covid 19 precautions are lifted.   Thanks, chris

## 2018-06-23 NOTE — Telephone Encounter (Signed)
Sent to C19 pool for r/s.

## 2018-06-23 NOTE — Progress Notes (Signed)
Carelink Summary Report / Loop Recorder 

## 2018-06-25 ENCOUNTER — Ambulatory Visit: Payer: Medicare HMO | Admitting: Cardiovascular Disease

## 2018-06-29 DIAGNOSIS — E785 Hyperlipidemia, unspecified: Secondary | ICD-10-CM | POA: Diagnosis not present

## 2018-06-29 DIAGNOSIS — N1330 Unspecified hydronephrosis: Secondary | ICD-10-CM | POA: Diagnosis not present

## 2018-06-29 DIAGNOSIS — I2510 Atherosclerotic heart disease of native coronary artery without angina pectoris: Secondary | ICD-10-CM | POA: Diagnosis not present

## 2018-06-29 DIAGNOSIS — I5030 Unspecified diastolic (congestive) heart failure: Secondary | ICD-10-CM | POA: Diagnosis not present

## 2018-06-29 DIAGNOSIS — I252 Old myocardial infarction: Secondary | ICD-10-CM | POA: Diagnosis not present

## 2018-06-29 DIAGNOSIS — Z48816 Encounter for surgical aftercare following surgery on the genitourinary system: Secondary | ICD-10-CM | POA: Diagnosis not present

## 2018-06-29 DIAGNOSIS — J449 Chronic obstructive pulmonary disease, unspecified: Secondary | ICD-10-CM | POA: Diagnosis not present

## 2018-06-29 DIAGNOSIS — I110 Hypertensive heart disease with heart failure: Secondary | ICD-10-CM | POA: Diagnosis not present

## 2018-06-29 DIAGNOSIS — R531 Weakness: Secondary | ICD-10-CM | POA: Diagnosis not present

## 2018-06-29 DIAGNOSIS — R2689 Other abnormalities of gait and mobility: Secondary | ICD-10-CM | POA: Diagnosis not present

## 2018-06-30 DIAGNOSIS — Z48816 Encounter for surgical aftercare following surgery on the genitourinary system: Secondary | ICD-10-CM | POA: Diagnosis not present

## 2018-06-30 DIAGNOSIS — R531 Weakness: Secondary | ICD-10-CM | POA: Diagnosis not present

## 2018-06-30 DIAGNOSIS — I110 Hypertensive heart disease with heart failure: Secondary | ICD-10-CM | POA: Diagnosis not present

## 2018-06-30 DIAGNOSIS — I252 Old myocardial infarction: Secondary | ICD-10-CM | POA: Diagnosis not present

## 2018-06-30 DIAGNOSIS — J449 Chronic obstructive pulmonary disease, unspecified: Secondary | ICD-10-CM | POA: Diagnosis not present

## 2018-06-30 DIAGNOSIS — N1330 Unspecified hydronephrosis: Secondary | ICD-10-CM | POA: Diagnosis not present

## 2018-06-30 DIAGNOSIS — E785 Hyperlipidemia, unspecified: Secondary | ICD-10-CM | POA: Diagnosis not present

## 2018-06-30 DIAGNOSIS — I5030 Unspecified diastolic (congestive) heart failure: Secondary | ICD-10-CM | POA: Diagnosis not present

## 2018-06-30 DIAGNOSIS — R2689 Other abnormalities of gait and mobility: Secondary | ICD-10-CM | POA: Diagnosis not present

## 2018-06-30 DIAGNOSIS — I2510 Atherosclerotic heart disease of native coronary artery without angina pectoris: Secondary | ICD-10-CM | POA: Diagnosis not present

## 2018-07-01 ENCOUNTER — Encounter: Payer: Medicare HMO | Admitting: Physical Medicine & Rehabilitation

## 2018-07-03 ENCOUNTER — Other Ambulatory Visit: Payer: Self-pay | Admitting: Cardiovascular Disease

## 2018-07-03 MED ORDER — AMLODIPINE BESYLATE 10 MG PO TABS: 10.0000 mg | ORAL_TABLET | Freq: Every day | ORAL | 1 refills | Status: DC

## 2018-07-03 NOTE — Telephone Encounter (Signed)
Pt's medication was sent to pt's pharmacy as requested. Confirmation received.  °

## 2018-07-16 ENCOUNTER — Ambulatory Visit (INDEPENDENT_AMBULATORY_CARE_PROVIDER_SITE_OTHER): Payer: Medicare HMO | Admitting: *Deleted

## 2018-07-16 ENCOUNTER — Other Ambulatory Visit: Payer: Self-pay

## 2018-07-16 DIAGNOSIS — I639 Cerebral infarction, unspecified: Secondary | ICD-10-CM

## 2018-07-16 LAB — CUP PACEART REMOTE DEVICE CHECK
Date Time Interrogation Session: 20200416214039
Implantable Pulse Generator Implant Date: 20191206

## 2018-07-22 DIAGNOSIS — I110 Hypertensive heart disease with heart failure: Secondary | ICD-10-CM | POA: Diagnosis not present

## 2018-07-22 DIAGNOSIS — I69354 Hemiplegia and hemiparesis following cerebral infarction affecting left non-dominant side: Secondary | ICD-10-CM | POA: Diagnosis not present

## 2018-07-23 DIAGNOSIS — N201 Calculus of ureter: Secondary | ICD-10-CM | POA: Diagnosis not present

## 2018-07-23 NOTE — Progress Notes (Signed)
Carelink Summary Report / Loop Recorder 

## 2018-08-18 ENCOUNTER — Ambulatory Visit (INDEPENDENT_AMBULATORY_CARE_PROVIDER_SITE_OTHER): Payer: Medicare HMO | Admitting: *Deleted

## 2018-08-18 ENCOUNTER — Other Ambulatory Visit: Payer: Self-pay

## 2018-08-18 DIAGNOSIS — I639 Cerebral infarction, unspecified: Secondary | ICD-10-CM

## 2018-08-19 LAB — CUP PACEART REMOTE DEVICE CHECK
Date Time Interrogation Session: 20200519234033
Implantable Pulse Generator Implant Date: 20191206

## 2018-08-21 DIAGNOSIS — N3090 Cystitis, unspecified without hematuria: Secondary | ICD-10-CM | POA: Diagnosis not present

## 2018-08-21 DIAGNOSIS — I69354 Hemiplegia and hemiparesis following cerebral infarction affecting left non-dominant side: Secondary | ICD-10-CM | POA: Diagnosis not present

## 2018-08-21 DIAGNOSIS — N3020 Other chronic cystitis without hematuria: Secondary | ICD-10-CM | POA: Diagnosis not present

## 2018-08-21 DIAGNOSIS — I110 Hypertensive heart disease with heart failure: Secondary | ICD-10-CM | POA: Diagnosis not present

## 2018-08-26 ENCOUNTER — Encounter: Payer: Self-pay | Admitting: Physical Medicine & Rehabilitation

## 2018-08-26 ENCOUNTER — Telehealth: Payer: Self-pay | Admitting: *Deleted

## 2018-08-26 ENCOUNTER — Encounter: Payer: Medicare HMO | Attending: Physical Medicine & Rehabilitation | Admitting: Physical Medicine & Rehabilitation

## 2018-08-26 ENCOUNTER — Other Ambulatory Visit: Payer: Self-pay

## 2018-08-26 VITALS — BP 110/64 | HR 69 | Ht 63.0 in | Wt 173.0 lb

## 2018-08-26 DIAGNOSIS — G460 Middle cerebral artery syndrome: Secondary | ICD-10-CM

## 2018-08-26 DIAGNOSIS — I69398 Other sequelae of cerebral infarction: Secondary | ICD-10-CM | POA: Diagnosis not present

## 2018-08-26 DIAGNOSIS — R42 Dizziness and giddiness: Secondary | ICD-10-CM | POA: Diagnosis not present

## 2018-08-26 DIAGNOSIS — I63411 Cerebral infarction due to embolism of right middle cerebral artery: Secondary | ICD-10-CM | POA: Diagnosis not present

## 2018-08-26 MED ORDER — MECLIZINE HCL 25 MG PO TABS: 25.0000 mg | ORAL_TABLET | Freq: Three times a day (TID) | ORAL | 0 refills | Status: DC | PRN

## 2018-08-26 NOTE — Progress Notes (Signed)
Subjective:    Patient ID: Theresa Ray, female    DOB: 29-Jul-1945, 73 y.o.   MRN: [id number]  HPI   [name] is here in follow up of her CVA. She complains of dizziness (the room is spinning) when she first gets out of bed at least 2-3 x per week.. She states that they have been more prevalent the last few weeks. They last for about 3-6 hours typically. She will sit or lay down and hope symptoms resolve. She fell yesterday but it was because her foot stlipped and not from vertigo.  Aside from that she has felt better. Her shoulder has felt better for the most part. It has been doing even better after the fall which she feels "loosened up" her arm.     Pain Inventory Average Pain 2 Pain Right Now 0 My pain is na  In the last 24 hours, has pain interfered with the following? General activity 0 Relation with others 0 Enjoyment of life 3 What TIME of day is your pain at its worst? night Sleep (in general) Good  Pain is worse with: walking and standing Pain improves with: rest Relief from Meds: 5  Mobility walk without assistance ability to climb steps?  yes do you drive?  no  Function retired  Neuro/Psych bladder control problems weakness tremor trouble walking dizziness confusion  Prior Studies Any changes since last visit?  no  Physicians involved in your care Any changes since last visit?  no   Family History  Problem Relation Age of Onset  . Heart disease Mother 39       CABG  . CVA Father    Social History   Socioeconomic History  . Marital status: Married    Spouse name: Not on file  . Number of children: Not on file  . Years of education: Not on file  . Highest education level: Not on file  Occupational History  . Not on file  Social Needs  . Financial resource strain: Not on file  . Food insecurity:    Worry: Not on file    Inability: Not on file  . Transportation needs:    Medical: Not on file    Non-medical: Not on file  Tobacco Use  .  Smoking status: Former Smoker    Types: Cigarettes    Start date: 1967    Last attempt to quit: 1975    Years since quitting: 45.4  . Smokeless tobacco: Never Used  Substance and Sexual Activity  . Alcohol use: No    Frequency: Never  . Drug use: No  . Sexual activity: Not on file  Lifestyle  . Physical activity:    Days per week: Not on file    Minutes per session: Not on file  . Stress: Not on file  Relationships  . Social connections:    Talks on phone: Not on file    Gets together: Not on file    Attends religious service: Not on file    Active member of club or organization: Not on file    Attends meetings of clubs or organizations: Not on file    Relationship status: Not on file  Other Topics Concern  . Not on file  Social History Narrative  . Not on file   Past Surgical History:  Procedure Laterality Date  . ABDOMINAL HYSTERECTOMY    . CORONARY ARTERY BYPASS GRAFT N/A 02/24/2017   Procedure: CORONARY ARTERY BYPASS GRAFTING (CABG) USING LEFT INTERNAL MAMMARY  ARTERY TO LAD AND ENDOSCOPICALLY HARVESTED GREATER SAPHENOUS VEIN TO PDA AND TO OM1.;  Surgeon: Grace Isaac, MD;  Location: [place];  Service: Open Heart Surgery;  Laterality: N/A;  . ENDOVEIN HARVEST OF GREATER SAPHENOUS VEIN Right 02/24/2017   Procedure: ENDOVEIN HARVEST OF GREATER SAPHENOUS VEIN;  Surgeon: Grace Isaac, MD;  Location: [place];  Service: Open Heart Surgery;  Laterality: Right;  . LAPAROSCOPIC GASTRIC BANDING    . LEFT HEART CATH AND CORONARY ANGIOGRAPHY N/A 02/18/2017   Procedure: LEFT HEART CATH AND CORONARY ANGIOGRAPHY;  Surgeon: Leonie Man, MD;  Location: [place] [facility];  Service: Cardiovascular;  Laterality: N/A;  . LOOP RECORDER INSERTION N/A 03/06/2018   Procedure: LOOP RECORDER INSERTION;  Surgeon: Constance Haw, MD;  Location: [place] [facility];  Service: Cardiovascular;  Laterality: N/A;  . TEE WITHOUT CARDIOVERSION N/A 02/24/2017   Procedure: TRANSESOPHAGEAL  ECHOCARDIOGRAM (TEE);  Surgeon: Grace Isaac, MD;  Location: [place];  Service: Open Heart Surgery;  Laterality: N/A;  . TEE WITHOUT CARDIOVERSION N/A 03/06/2018   Procedure: TRANSESOPHAGEAL ECHOCARDIOGRAM (TEE);  Surgeon: Dorothy Spark, MD;  Location[facility];  Service: Cardiovascular;  Laterality: N/A;  loop   Past Medical History:  Diagnosis Date  . CAD in native artery    a. s/p CABGx3 in 01/2017.  Marland Kitchen Chronic diastolic CHF (congestive heart failure) ([place])   . Diabetes mellitus ([place])   . Diabetic neuropathy ([place])   . History of kidney stones    history of stones  . Hypertension   . Hypothyroidism   . NSTEMI (non-ST elevated myocardial infarction) ([place])   . Orthostatic hypotension   . Postoperative atrial fibrillation ([place])    a. after CABG 01/2017.  Marland Kitchen TIA (transient ischemic attack)    BP 110/64   Pulse 69   Ht 5\' 3"  (1.6 m)   Wt 173 lb (78.5 kg)   LMP  (LMP Unknown)   SpO2 98%   BMI 30.65 kg/m   Opioid Risk Score:   Fall Risk Score:  `1  Depression screen PHQ 2/9  Depression screen [facility] 2/9 05/06/2018 04/07/2018  Decreased Interest 3 0  Down, Depressed, Hopeless 3 0  PHQ - 2 Score 6 0  Altered sleeping 2 1  Tired, decreased energy 3 1  Change in appetite 3 0  Feeling bad or failure about yourself  2 0  Trouble concentrating 2 1  Moving slowly or fidgety/restless 0 1  Suicidal thoughts 2 0  PHQ-9 Score 20 4  Difficult doing work/chores Not difficult at all Not difficult at all     Review of Systems  Constitutional: Negative.   HENT: Negative.   Eyes: Negative.   Respiratory: Negative.   Cardiovascular: Negative.   Gastrointestinal: Positive for nausea and vomiting.  Endocrine: Negative.   Genitourinary: Positive for difficulty urinating.  Musculoskeletal: Positive for arthralgias, back pain and myalgias.  Skin: Negative.   Allergic/Immunologic: Negative.   Neurological: Positive for dizziness, tremors and weakness.  Hematological: Negative.    Psychiatric/Behavioral: Positive for confusion.  All other systems reviewed and are negative.      Objective:   Physical Exam  General: Alert and oriented x 3, No apparent distress HEENT: Head is normocephalic, atraumatic, PERRLA, EOMI, sclera anicteric, oral mucosa pink and moist, dentition intact, ext ear canals clear,  Neck: Supple without JVD or lymphadenopathy Heart: Reg rate and rhythm. No murmurs rubs or gallops Chest: CTA bilaterally without wheezes, rales, or rhonchi; no distress Abdomen:  Soft, non-tender, non-distended, bowel sounds positive. Extremities: No clubbing, cyanosis, or edema. Pulses are 2+ Skin: Clean and intact without signs of breakdown Neuro: Pt is demonstrating improved insight, memory, and awareness. improved processing. hallpike dix maneuvers negative bilaterally. Found no nystagmus with any type of stimulation of eyes. Sensory exam is normal. Reflexes are 2+ in all 4's. Fine motor coordination is intact. No tremors. Motor function is grossly 5 out of 5 right upper extremity left lower extremity.  left lower right upper extremity grossly 4 out of 5 with decreased fine motor coordination.  There was some pain in addition at the right shoulder.. tends to sway during gait and drift to left when walking.  Musculoskeletal: improved right shoulder rom.  Psych: Pt's affect is appropriate. Pt is cooperative         Assessment & Plan:  1. 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 Could be related to location of prior infarcts. May have increased awareness of hit happening as well.             - referral  to outpt therapies for balance, gait, vestibular assessment              -will try at [facility] first   -meclizine trial for vertigo   -asked pt/husband to check bp at home when she feels dizzy 2.  Hypertension: PER Primary  -avoid excessive hypotension 3.  Type 2 diabetes:  Per primary care physician. 4.  History of hydronephrosis with urinary obstruction due to renal calculus: Patient status post double-J stents.  Urology has followed patient. 5. Right rotator cuff injury/tear             -continued HEP     15 minutes was spent with the patient in the office today.  I will see her back in about 2 months time.

## 2018-08-26 NOTE — Telephone Encounter (Signed)
I spoke with pt's husband and scheduled phone visit for May 29,2020.  Consent obtained on May 27,2020.  Virtual Visit Pre-Appointment Phone Call  "(Name), I am calling you today to discuss your upcoming appointment. We are currently trying to limit exposure to the virus that causes COVID-19 by seeing patients at home rather than in the office."  "What is the BEST phone number to call the day of the visit?" -[phone]  1. "Do you have or have access to (through a family member/friend) a smartphone with video capability that we can use for your visit?"NO a. If yes - list this number in appt notes as "cell" (if different from BEST phone #) and list the appointment type as a VIDEO visit in appointment notes b. If no - list the appointment type as a PHONE visit in appointment notes  2. Confirm consent - "In the setting of the current Covid19 crisis, you are scheduled for a phone visit with your provider on May 29,2020 at 1:30.  Just as we do with many in-office visits, in order for you to participate in this visit, we must obtain consent.  If you'd like, I can send this to your mychart (if signed up) or email for you to review.  Otherwise, I can obtain your verbal consent now.  All virtual visits are billed to your insurance company just like a normal visit would be.  By agreeing to a virtual visit, we'd like you to understand that the technology does not allow for your provider to perform an examination, and thus may limit your provider's ability to fully assess your condition. If your provider identifies any concerns that need to be evaluated in person, we will make arrangements to do so.  Finally, though the technology is pretty good, we cannot assure that it will always work on either your or our end, and in the setting of a video visit, we may have to convert it to a phone-only visit.  In either situation, we cannot ensure that we have a secure connection.  Are you willing to proceed?" STAFF: Did  the patient verbally acknowledge consent to telehealth visit? Document YES/NO here: yes  3. Advise patient to be prepared - "Two hours prior to your appointment, go ahead and check your blood pressure, pulse, oxygen saturation, and your weight (if you have the equipment to check those) and write them all down. When your visit starts, your provider will ask you for this information. If you have an Apple Watch or Kardia device, please plan to have heart rate information ready on the day of your appointment. Please have a pen and paper handy nearby the day of the visit as well."  4. Give patient instructions for MyChart download to smartphone OR Doximity/Doxy.me as below if video visit (depending on what platform provider is using)  5. Inform patient they will receive a phone call 15 minutes prior to their appointment time (may be from unknown caller ID) so they should be prepared to answer    Theresa Ray has been deemed a candidate for a follow-up tele-health visit to limit community exposure during the Covid-19 pandemic. I spoke with the patient via phone to ensure availability of phone/video source, confirm preferred email & phone number, and discuss instructions and expectations.  I reminded Theresa Ray to be prepared with any vital sign and/or heart rhythm information that could potentially be obtained via home monitoring, at the time of her visit. I  reminded Theresa Ray to expect a phone call prior to her visit.  Leodis Liverpool, RN 08/26/2018 8:55 AM   INSTRUCTIONS FOR DOWNLOADING THE MYCHART APP TO SMARTPHONE  - The patient must first make sure to have activated MyChart and know their login information - If Apple, go to [employer] and type in MyChart in the search bar and download the app. If Android, ask patient to go to [employer] and type in [place] in the search bar and download the app. The app is free but as with any other app downloads, their phone  may require them to verify saved payment information or Apple/Android password.  - The patient will need to then log into the app with their MyChart username and password, and select [facility] as their healthcare provider to link the account. When it is time for your visit, go to the MyChart app, find appointments, and click Begin Video Visit. Be sure to Select Allow for your device to access the Microphone and Camera for your visit. You will then be connected, and your provider will be with you shortly.  **If they have any issues connecting, or need assistance please contact MyChart service desk [phone])**  **If using a computer, in order to ensure the best quality for their visit they will need to use either of the following Internet Browsers: [employer], or Google Chrome**  IF USING DOXIMITY or DOXY.ME - The patient will receive a link just prior to their visit by text.     FULL LENGTH CONSENT FOR TELE-HEALTH VISIT   I hereby voluntarily request, consent and authorize [facility] and its employed or contracted physicians, physician assistants, nurse practitioners or other licensed health care professionals (the Practitioner), to provide me with telemedicine health care services (the "Services") as deemed necessary by the treating Practitioner. I acknowledge and consent to receive the Services by the Practitioner via telemedicine. I understand that the telemedicine visit will involve communicating with the Practitioner through live audiovisual communication technology and the disclosure of certain medical information by electronic transmission. I acknowledge that I have been given the opportunity to request an in-person assessment or other available alternative prior to the telemedicine visit and am voluntarily participating in the telemedicine visit.  I understand that I have the right to withhold or withdraw my consent to the use of telemedicine in the course of my  care at any time, without affecting my right to future care or treatment, and that the Practitioner or I may terminate the telemedicine visit at any time. I understand that I have the right to inspect all information obtained and/or recorded in the course of the telemedicine visit and may receive copies of available information for a reasonable fee.  I understand that some of the potential risks of receiving the Services via telemedicine include:  Marland Kitchen Delay or interruption in medical evaluation due to technological equipment failure or disruption; . Information transmitted may not be sufficient (e.g. poor resolution of images) to allow for appropriate medical decision making by the Practitioner; and/or  . In rare instances, security protocols could fail, causing a breach of personal health information.  Furthermore, I acknowledge that it is my responsibility to provide information about my medical history, conditions and care that is complete and accurate to the best of my ability. I acknowledge that Practitioner's advice, recommendations, and/or decision may be based on factors not within their control, such as incomplete or inaccurate data provided by me or distortions of  diagnostic images or specimens that may result from electronic transmissions. I understand that the practice of medicine is not an exact science and that Practitioner makes no warranties or guarantees regarding treatment outcomes. I acknowledge that I will receive a copy of this consent concurrently upon execution via email to the email address I last provided but may also request a printed copy by calling the office of [facility].    I understand that my insurance will be billed for this visit.   I have read or had this consent read to me. . I understand the contents of this consent, which adequately explains the benefits and risks of the Services being provided via telemedicine.  . I have been provided ample opportunity to ask  questions regarding this consent and the Services and have had my questions answered to my satisfaction. . I give my informed consent for the services to be provided through the use of telemedicine in my medical care  By participating in this telemedicine visit I agree to the above.

## 2018-08-27 NOTE — Progress Notes (Signed)
Carelink Summary Report / Loop Recorder 

## 2018-08-28 ENCOUNTER — Telehealth (INDEPENDENT_AMBULATORY_CARE_PROVIDER_SITE_OTHER): Payer: Medicare HMO | Admitting: Cardiovascular Disease

## 2018-08-28 ENCOUNTER — Other Ambulatory Visit: Payer: Self-pay

## 2018-08-28 ENCOUNTER — Encounter: Payer: Self-pay | Admitting: Cardiovascular Disease

## 2018-08-28 VITALS — BP 142/76 | HR 70 | Ht 63.0 in | Wt 170.4 lb

## 2018-08-28 DIAGNOSIS — I25810 Atherosclerosis of coronary artery bypass graft(s) without angina pectoris: Secondary | ICD-10-CM

## 2018-08-28 DIAGNOSIS — I4891 Unspecified atrial fibrillation: Secondary | ICD-10-CM

## 2018-08-28 DIAGNOSIS — Z8673 Personal history of transient ischemic attack (TIA), and cerebral infarction without residual deficits: Secondary | ICD-10-CM | POA: Diagnosis not present

## 2018-08-28 DIAGNOSIS — I9789 Other postprocedural complications and disorders of the circulatory system, not elsewhere classified: Secondary | ICD-10-CM | POA: Diagnosis not present

## 2018-08-28 DIAGNOSIS — I5032 Chronic diastolic (congestive) heart failure: Secondary | ICD-10-CM

## 2018-08-28 NOTE — Patient Instructions (Signed)

## 2018-08-28 NOTE — Progress Notes (Signed)
Virtual Visit via Telephone Note   This visit type was conducted due to national recommendations for restrictions regarding the COVID-19 Pandemic (e.g. social distancing) in an effort to limit this patient's exposure and mitigate transmission in our community.  Due to her co-morbid illnesses, this patient is at least at moderate risk for complications without adequate follow up.  This format is felt to be most appropriate for this patient at this time.  All issues noted in this document were discussed and addressed.  No physical exam was performed with this format.  Please refer to the patient's chart for her consent to telehealth for [facility].   Date:  08/28/2018   ID:  Theresa DICOCCO, DOB 15-Feb-1946, MRN [id number]  Patient Location: Home Provider Location: Home  PCP:  Ronita Hipps, MD  Cardiologist:  Lauree Chandler, MD  Electrophysiologist:  None   Evaluation Performed:  Follow-Up Visit  Chief Complaint:  Follow up- CAD  History of Present Illness:    Theresa Ray is a 73 y.o. female with history of chronic diastolic CHF, DM, HTN and CAD s/p CABG who is being seen today by virtual e-visit due to the [place] pandemic. She was admitted to [facility] in November 2018 with a NSTEMI and found to have severe three vessel CAD. She underwent 3 V CABG on 02/24/17 (LIMA to LAD, SVG to PDA, SVG to OM1). She had post op atrial fib and converted to sinus on amiodarone prior to discharge. Several episodes of syncope post discharge and she was felt to be hypovolemic. Her Norvasc and Lasix was stopped and Coreg dosage was reduced. Cardiac event monitor was normal. Symptoms resolved. Echo 02/17/17 with LVEF=55-60%, mild MR. Cardiac monitor November 2019 with no evidence of atrial fibrillation. She had a stroke in December 2019.  TEE 03/06/18 with no intracardiac thrombus and no PFO or ASD. Loop recorder was placed.  The patient denies chest pain, dyspnea, palpitations, dizziness, near syncope  or syncope. No lower extremity edema.   The patient does not have symptoms concerning for COVID-19 infection (fever, chills, cough, or new shortness of breath).    Past Medical History:  Diagnosis Date  . CAD in native artery    a. s/p CABGx3 in 01/2017.  Marland Kitchen Chronic diastolic CHF (congestive heart failure) ([place])   . Diabetes mellitus ([place])   . Diabetic neuropathy ([place])   . History of kidney stones    history of stones  . Hypertension   . Hypothyroidism   . NSTEMI (non-ST elevated myocardial infarction) ([place])   . Orthostatic hypotension   . Postoperative atrial fibrillation ([place])    a. after CABG 01/2017.  Marland Kitchen TIA (transient ischemic attack)    Past Surgical History:  Procedure Laterality Date  . ABDOMINAL HYSTERECTOMY    . CORONARY ARTERY BYPASS GRAFT N/A 02/24/2017   Procedure: CORONARY ARTERY BYPASS GRAFTING (CABG) USING LEFT INTERNAL MAMMARY ARTERY TO LAD AND ENDOSCOPICALLY HARVESTED GREATER SAPHENOUS VEIN TO PDA AND TO OM1.;  Surgeon: Grace Isaac, MD;  Location: [facility];  Service: Open Heart Surgery;  Laterality: N/A;  . ENDOVEIN HARVEST OF GREATER SAPHENOUS VEIN Right 02/24/2017   Procedure: ENDOVEIN HARVEST OF GREATER SAPHENOUS VEIN;  Surgeon: Grace Isaac, MD;  Location: [facility];  Service: Open Heart Surgery;  Laterality: Right;  . LAPAROSCOPIC GASTRIC BANDING    . LEFT HEART CATH AND CORONARY ANGIOGRAPHY N/A 02/18/2017   Procedure: LEFT HEART CATH AND CORONARY ANGIOGRAPHY;  Surgeon: Leonie Man,  MD;  Location: [facility] CV LAB;  Service: Cardiovascular;  Laterality: N/A;  . LOOP RECORDER INSERTION N/A 03/06/2018   Procedure: LOOP RECORDER INSERTION;  Surgeon: Constance Haw, MD;  Location: [place] CV LAB;  Service: Cardiovascular;  Laterality: N/A;  . TEE WITHOUT CARDIOVERSION N/A 02/24/2017   Procedure: TRANSESOPHAGEAL ECHOCARDIOGRAM (TEE);  Surgeon: Grace Isaac, MD;  Location: [facility];  Service: Open Heart Surgery;  Laterality: N/A;  . TEE  WITHOUT CARDIOVERSION N/A 03/06/2018   Procedure: TRANSESOPHAGEAL ECHOCARDIOGRAM (TEE);  Surgeon: Dorothy Spark, MD;  Location[facility] ENDOSCOPY;  Service: Cardiovascular;  Laterality: N/A;  loop     Current Meds  Medication Sig  . acetaminophen (TYLENOL) 325 MG tablet Take 2 tablets (650 mg total) by mouth every 6 (six) hours as needed for mild pain.  Marland Kitchen amLODipine (NORVASC) 10 MG tablet Take 1 tablet (10 mg total) by mouth daily.  Marland Kitchen aspirin EC 81 MG tablet Take 1 tablet (81 mg total) by mouth daily.  Marland Kitchen atorvastatin (LIPITOR) 40 MG tablet Take 1 tablet (40 mg total) by mouth daily.  . Carboxymethylcellul-Glycerin (CLEAR EYES FOR DRY EYES) 1-0.25 % SOLN Place 1 drop into both eyes as needed.  . carvedilol (COREG) 12.5 MG tablet Take 1 tablet (12.5 mg total) by mouth 2 (two) times daily.  . cholecalciferol (VITAMIN D) 1000 units tablet Take 1,000 Units by mouth daily.   . citalopram (CELEXA) 10 MG tablet Take 1 tablet (10 mg total) by mouth daily.  . clopidogrel (PLAVIX) 75 MG tablet Take 1 tablet (75 mg total) by mouth daily.  Marland Kitchen glipiZIDE (GLUCOTROL) 5 MG tablet Take 1 tablet (5 mg total) by mouth 2 (two) times daily before a meal.  . Glucosamine-Chondroit-Vit C-Mn (GLUCOSAMINE CHONDR 500 COMPLEX PO) Take 1 tablet 2 (two) times daily by mouth.  Javier Docker Oil 1000 MG CAPS Take 1,000 mg daily by mouth.  . levothyroxine (SYNTHROID, LEVOTHROID) 175 MCG tablet Take 1 tablet (175 mcg total) by mouth daily before breakfast.  . lisinopril (PRINIVIL,ZESTRIL) 5 MG tablet Take 1 tablet (5 mg total) by mouth daily.  . magnesium oxide (MAG-OX) 400 (241.3 Mg) MG tablet Take 1 tablet (400 mg total) by mouth 2 (two) times daily.  . meclizine (ANTIVERT) 25 MG tablet Take 1 tablet (25 mg total) by mouth 3 (three) times daily as needed for dizziness.  . metFORMIN (GLUCOPHAGE) 500 MG tablet Take 1 tablet (500 mg total) by mouth 2 (two) times daily with a meal.  . Multiple Vitamin (MULTIVITAMIN WITH MINERALS) TABS  tablet Take 1 tablet daily by mouth.  . pantoprazole (PROTONIX) 40 MG tablet Take 1 tablet (40 mg total) by mouth daily.     Allergies:   Sulfa antibiotics; Shellfish allergy; and Aggrenox [aspirin-dipyridamole er]   Social History   Tobacco Use  . Smoking status: Former Smoker    Types: Cigarettes    Start date: 1967    Last attempt to quit: 1975    Years since quitting: 45.4  . Smokeless tobacco: Never Used  Substance Use Topics  . Alcohol use: No    Frequency: Never  . Drug use: No     Family Hx: The patient's family history includes CVA in her father; Heart disease (age of onset: 68) in her mother.  ROS:   Please see the history of present illness.    All other systems reviewed and are negative.   Prior CV studies:   The following studies were reviewed today:  Labs/Other Tests and Data Reviewed:    EKG:  No ECG reviewed.  Recent Labs: 03/09/2018: Magnesium 1.5 03/10/2018: ALT 27 03/19/2018: BUN 18; Creatinine, Ser 1.22; Potassium 4.2; Sodium 137 03/23/2018: Hemoglobin 10.2; Platelets 471   Recent Lipid Panel Lab Results  Component Value Date/Time   CHOL 167 03/03/2018 10:46 AM   TRIG 157 (H) 03/03/2018 10:46 AM   HDL 37 (L) 03/03/2018 10:46 AM   CHOLHDL 4.5 03/03/2018 10:46 AM   LDLCALC 99 03/03/2018 10:46 AM    Wt Readings from Last 3 Encounters:  08/28/18 170 lb 6.4 oz (77.3 kg)  08/26/18 173 lb (78.5 kg)  05/06/18 173 lb 9.6 oz (78.7 kg)     Objective:    Vital Signs:  BP (!) 142/76   Pulse 70   Ht 5\' 3"  (1.6 m)   Wt 170 lb 6.4 oz (77.3 kg)   LMP  (LMP Unknown)   BMI 30.19 kg/m    No exam Telephone visit  ASSESSMENT & PLAN:    1. CAD s/p CABG without angina: She had CABG in November 2018. No chest pain. Will continue ASA, Plavix, statin and beta blocker.    2. Post op atrial fibrillation: No known recurrence. No palpitations. Continue beta blocker.    3. Chronic diastolic CHF: Weight is stable.   4. H/O CVA: Loop recorder in  place. TEE with no PFO/ASD or intracardiac thrombus December 2019. No atrial fib noted on loop recorder. Followed by our EP team.   COVID-19 Education: The signs and symptoms of COVID-19 were discussed with the patient and how to seek care for testing (follow up with PCP or arrange E-visit).  The importance of social distancing was discussed today.  Time:   Today, I have spent 13 minutes with the patient with telehealth technology discussing the above problems.     Medication Adjustments/Labs and Tests Ordered: Current medicines are reviewed at length with the patient today.  Concerns regarding medicines are outlined above.   Tests Ordered: No orders of the defined types were placed in this encounter.   Medication Changes: No orders of the defined types were placed in this encounter.   Disposition:  Follow up in 6 month(s)  Signed, Lauree Chandler, MD  08/28/2018 2:25 PM    [facility]

## 2018-09-01 ENCOUNTER — Ambulatory Visit: Payer: Medicare HMO | Admitting: Physical Medicine & Rehabilitation

## 2018-09-01 DIAGNOSIS — I63411 Cerebral infarction due to embolism of right middle cerebral artery: Secondary | ICD-10-CM | POA: Diagnosis not present

## 2018-09-01 DIAGNOSIS — R269 Unspecified abnormalities of gait and mobility: Secondary | ICD-10-CM | POA: Diagnosis not present

## 2018-09-01 DIAGNOSIS — I69398 Other sequelae of cerebral infarction: Secondary | ICD-10-CM | POA: Diagnosis not present

## 2018-09-02 ENCOUNTER — Emergency Department (HOSPITAL_COMMUNITY): Payer: Medicare HMO

## 2018-09-02 ENCOUNTER — Emergency Department (HOSPITAL_COMMUNITY)
Admission: EM | Admit: 2018-09-02 | Discharge: 2018-09-03 | Disposition: A | Discharge status: Home / Self Care | Payer: Medicare HMO | Attending: Emergency Medicine | Admitting: Emergency Medicine

## 2018-09-02 ENCOUNTER — Other Ambulatory Visit: Payer: Self-pay

## 2018-09-02 ENCOUNTER — Encounter (HOSPITAL_COMMUNITY): Payer: Self-pay

## 2018-09-02 DIAGNOSIS — S43005A Unspecified dislocation of left shoulder joint, initial encounter: Secondary | ICD-10-CM

## 2018-09-02 DIAGNOSIS — I110 Hypertensive heart disease with heart failure: Secondary | ICD-10-CM | POA: Insufficient documentation

## 2018-09-02 DIAGNOSIS — Z7982 Long term (current) use of aspirin: Secondary | ICD-10-CM | POA: Insufficient documentation

## 2018-09-02 DIAGNOSIS — W010XXA Fall on same level from slipping, tripping and stumbling without subsequent striking against object, initial encounter: Secondary | ICD-10-CM | POA: Diagnosis not present

## 2018-09-02 DIAGNOSIS — Y9301 Activity, walking, marching and hiking: Secondary | ICD-10-CM | POA: Insufficient documentation

## 2018-09-02 DIAGNOSIS — S4992XA Unspecified injury of left shoulder and upper arm, initial encounter: Secondary | ICD-10-CM | POA: Diagnosis present

## 2018-09-02 DIAGNOSIS — Z87891 Personal history of nicotine dependence: Secondary | ICD-10-CM | POA: Insufficient documentation

## 2018-09-02 DIAGNOSIS — Z7902 Long term (current) use of antithrombotics/antiplatelets: Secondary | ICD-10-CM | POA: Insufficient documentation

## 2018-09-02 DIAGNOSIS — I5032 Chronic diastolic (congestive) heart failure: Secondary | ICD-10-CM | POA: Diagnosis not present

## 2018-09-02 DIAGNOSIS — S42292A Other displaced fracture of upper end of left humerus, initial encounter for closed fracture: Secondary | ICD-10-CM | POA: Diagnosis not present

## 2018-09-02 DIAGNOSIS — Z7984 Long term (current) use of oral hypoglycemic drugs: Secondary | ICD-10-CM | POA: Diagnosis not present

## 2018-09-02 DIAGNOSIS — S42352A Displaced comminuted fracture of shaft of humerus, left arm, initial encounter for closed fracture: Secondary | ICD-10-CM | POA: Diagnosis not present

## 2018-09-02 DIAGNOSIS — Y999 Unspecified external cause status: Secondary | ICD-10-CM | POA: Insufficient documentation

## 2018-09-02 DIAGNOSIS — S0101XA Laceration without foreign body of scalp, initial encounter: Secondary | ICD-10-CM

## 2018-09-02 DIAGNOSIS — Y92009 Unspecified place in unspecified non-institutional (private) residence as the place of occurrence of the external cause: Secondary | ICD-10-CM | POA: Insufficient documentation

## 2018-09-02 LAB — CBC
HCT: 35.8 % — ABNORMAL LOW (ref 36.0–46.0)
Hemoglobin: 12 g/dL (ref 12.0–15.0)
MCH: 29.5 pg (ref 26.0–34.0)
MCHC: 33.5 g/dL (ref 30.0–36.0)
MCV: 88 fL (ref 80.0–100.0)
Platelets: 469 10*3/uL — ABNORMAL HIGH (ref 150–400)
RBC: 4.07 MIL/uL (ref 3.87–5.11)
RDW: 13.3 % (ref 11.5–15.5)
WBC: 14.4 10*3/uL — ABNORMAL HIGH (ref 4.0–10.5)
nRBC: 0 % (ref 0.0–0.2)

## 2018-09-02 LAB — BASIC METABOLIC PANEL
Anion gap: 13 (ref 5–15)
BUN: 20 mg/dL (ref 8–23)
CO2: 24 mmol/L (ref 22–32)
Calcium: 10.1 mg/dL (ref 8.9–10.3)
Chloride: 101 mmol/L (ref 98–111)
Creatinine, Ser: 1.6 mg/dL — ABNORMAL HIGH (ref 0.44–1.00)
GFR calc Af Amer: 37 mL/min — ABNORMAL LOW (ref >60)
GFR calc non Af Amer: 32 mL/min — ABNORMAL LOW (ref >60)
Glucose, Bld: 114 mg/dL — ABNORMAL HIGH (ref 70–99)
Potassium: 4 mmol/L (ref 3.5–5.1)
Sodium: 138 mmol/L (ref 135–145)

## 2018-09-02 MED ORDER — PROPOFOL 10 MG/ML IV BOLUS
1.0000 mg/kg | Freq: Once | INTRAVENOUS | Status: AC
  Administered 2018-09-02: 40 mg via INTRAVENOUS
  Filled 2018-09-02: qty 20

## 2018-09-02 MED ORDER — MORPHINE SULFATE (PF) 4 MG/ML IV SOLN
4.0000 mg | INTRAVENOUS | Status: DC | PRN
  Administered 2018-09-02: 4 mg via INTRAVENOUS
  Filled 2018-09-02: qty 1

## 2018-09-02 MED ORDER — ONDANSETRON HCL 4 MG/2ML IJ SOLN
4.0000 mg | Freq: Once | INTRAMUSCULAR | Status: AC | PRN
  Administered 2018-09-02: 4 mg via INTRAVENOUS
  Filled 2018-09-02: qty 2

## 2018-09-02 MED ORDER — OXYCODONE HCL 5 MG PO TABS: 2.5000 mg | ORAL_TABLET | Freq: Four times a day (QID) | ORAL | 0 refills | Status: DC | PRN

## 2018-09-02 NOTE — Sedation Documentation (Signed)
20 mg more propofol per MD

## 2018-09-02 NOTE — Sedation Documentation (Signed)
Dr. Stark Jock at bedside attempting reduction

## 2018-09-02 NOTE — ED Notes (Signed)
RN updated husband.

## 2018-09-02 NOTE — Sedation Documentation (Addendum)
20 mg more of propofol per MD

## 2018-09-02 NOTE — Sedation Documentation (Signed)
20 mg more of propofol per MD

## 2018-09-02 NOTE — Sedation Documentation (Signed)
EDP, ED MD, RT, ED tech, Threasa Beards, RN, and Herbie Baltimore, RN at bedside. Consent signed. Pre-procedure checklist done.

## 2018-09-02 NOTE — ED Triage Notes (Signed)
Pt sent from [facility] u/c.  Pt tripped over dog toy fell forward hitting left shoulder, falling on left side, hit head, denies LOC.   U/c put staples x 6 to lac on head.  Left wrist, left forearm, xray is negative.  Left shoulder xray: anterior inferior dislocation with communicated fx of the humeral head.

## 2018-09-02 NOTE — Discharge Instructions (Signed)
Please read and follow all provided instructions.  Your diagnoses today include:  1. Dislocation of left shoulder joint, initial encounter   2. Other closed displaced fracture of proximal end of left humerus, initial encounter   3. Laceration of scalp, initial encounter     Tests performed today include:  An x-ray of the affected area - shows shoulder dislocation and fracture of the humerus  CT scan of the shoulder  Vital signs. See below for your results today.   Medications prescribed:   Oxycodone - narcotic pain medication  DO NOT drive or perform any activities that require you to be awake and alert because this medicine can make you drowsy.   Use pain medication only under direct supervision at the lowest possible dose needed to control your pain.   Take any prescribed medications only as directed.  Home care instructions:   Follow any educational materials contained in this packet  Follow R.I.C.E. Protocol:  R - rest your injury   I  - use ice on injury without applying directly to skin  C - compress injury with bandage or splint  E - elevate the injury as much as possible  Follow-up instructions: Please follow-up with your primary care provider or the provided orthopedic physician (bone specialist) if you continue to have significant pain in 1 week. In this case you may have a more severe injury that requires further care.   Return instructions:   Please return if your fingers are numb or tingling, appear gray or blue, or you have severe pain (also elevate the arm and loosen splint or wrap if you were given one)  Please return to the Emergency Department if you experience worsening symptoms.   Please return if you have any other emergent concerns.  Additional Information:  Your vital signs today were: BP (!) 144/118    Pulse 86    Temp 98.5 F (36.9 C) (Oral)    Resp 18    LMP  (LMP Unknown)    SpO2 97%  If your blood pressure (BP) was elevated above  135/85 this visit, please have this repeated by your doctor within one month. --------------

## 2018-09-02 NOTE — ED Notes (Signed)
RN updated pt's husband

## 2018-09-02 NOTE — Sedation Documentation (Signed)
EPD at bedside attempting shoulder reduction

## 2018-09-02 NOTE — ED Provider Notes (Signed)
[facility] EMERGENCY DEPARTMENT Provider Note   CSN: [id number] Arrival date & time: 09/02/18  1759    History   Chief Complaint Chief Complaint  Patient presents with  . Fall  . Shoulder Injury    HPI Theresa Ray is a 73 y.o. female.     Patient with history of diabetes, coronary artery disease status post CABG, diastolic heart failure, TIA/CVA --presents the emergency department today with acute onset of left shoulder and upper arm pain.  Patient states that she tripped on a dog toy and fell against a door landing on her left shoulder.  Patient had pain and decided to go to an outside urgent care.  There she was found to have a fracture dislocation of her left shoulder.  She also had scalp laceration that was repaired with 6 staples.  Patient has not had any loss of consciousness, confusion, vomiting.  She is not currently anticoagulated or on any antiplatelets other than aspirin.  Patient's husband drove her to the emergency department here for continued treatment.  Onset of symptoms acute.  Course is constant.  Pain is worse with movement and palpation.     Past Medical History:  Diagnosis Date  . CAD in native artery    a. s/p CABGx3 in 01/2017.  Marland Kitchen Chronic diastolic CHF (congestive heart failure) ([place])   . Diabetes mellitus ([place])   . Diabetic neuropathy ([place])   . History of kidney stones    history of stones  . Hypertension   . Hypothyroidism   . NSTEMI (non-ST elevated myocardial infarction) ([place])   . Orthostatic hypotension   . Postoperative atrial fibrillation ([place])    a. after CABG 01/2017.  Marland Kitchen TIA (transient ischemic attack)     Patient Active Problem List   Diagnosis Date Noted  . Cerebral infarction due to embolism of right middle cerebral artery ([place]) 08/26/2018  . Vertigo as late effect of stroke 08/26/2018  . Rotator cuff syndrome, right 05/06/2018  . Acute blood loss anemia   . Calculus of ureter   . HSV (herpes simplex virus)  infection   . Labile blood glucose   . Diabetes mellitus type 2 in nonobese (HCC)   . Labile blood pressure   . Major depression, chronic   . 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 Hypothyroidism   . Dyslipidemia   . Leukocytosis   . AKI (acute kidney injury) ([place])   . Benign essential HTN   . Hydronephrosis with urinary obstruction due to renal calculus   . Chronic diastolic congestive heart failure ([place])   . Diabetes mellitus type 2 in obese ([place])   . Orthostasis   . Tachypnea   . Hypokalemia   . Severe sepsis ([place]) 03/02/2018  . Acute CVA (cerebrovascular accident) ([place])   . Pyelonephritis   . CAD (coronary artery disease) 05/12/2017  . Postoperative atrial fibrillation ([place]) 05/12/2017  . Orthostatic hypotension 03/28/2017  . Syncope 03/28/2017  . Tobacco abuse 03/18/2017  . Dizziness 03/18/2017  . Pressure injury of skin 02/26/2017  . NSTEMI (non-ST elevated myocardial infarction) ([place]) 02/16/2017  . Type 2 diabetes mellitus with diabetic neuropathy, without long-term current use of insulin ([place])   . Essential hypertension   . Hyperlipidemia     Past Surgical History:  Procedure Laterality Date  . ABDOMINAL HYSTERECTOMY    . CORONARY ARTERY BYPASS GRAFT N/A 02/24/2017   Procedure: CORONARY ARTERY BYPASS GRAFTING (CABG) USING LEFT INTERNAL MAMMARY ARTERY TO LAD AND ENDOSCOPICALLY HARVESTED  GREATER SAPHENOUS VEIN TO PDA AND TO OM1.;  Surgeon: Grace Isaac, MD;  Location: [facility];  Service: Open Heart Surgery;  Laterality: N/A;  . ENDOVEIN HARVEST OF GREATER SAPHENOUS VEIN Right 02/24/2017   Procedure: ENDOVEIN HARVEST OF GREATER SAPHENOUS VEIN;  Surgeon: Grace Isaac, MD;  Location: [facility];  Service: Open Heart Surgery;  Laterality: Right;  . LAPAROSCOPIC GASTRIC BANDING    . LEFT HEART CATH AND CORONARY ANGIOGRAPHY N/A 02/18/2017   Procedure: LEFT HEART CATH AND CORONARY ANGIOGRAPHY;  Surgeon: Leonie Man, MD;  Location: [facility];  Service:  Cardiovascular;  Laterality: N/A;  . LOOP RECORDER INSERTION N/A 03/06/2018   Procedure: LOOP RECORDER INSERTION;  Surgeon: Constance Haw, MD;  Location: [facility];  Service: Cardiovascular;  Laterality: N/A;  . TEE WITHOUT CARDIOVERSION N/A 02/24/2017   Procedure: TRANSESOPHAGEAL ECHOCARDIOGRAM (TEE);  Surgeon: Grace Isaac, MD;  Location: [facility];  Service: Open Heart Surgery;  Laterality: N/A;  . TEE WITHOUT CARDIOVERSION N/A 03/06/2018   Procedure: TRANSESOPHAGEAL ECHOCARDIOGRAM (TEE);  Surgeon: Dorothy Spark, MD;  Location: [facility] ENDOSCOPY;  Service: Cardiovascular;  Laterality: N/A;  loop     OB History   No obstetric history on file.      Home Medications    Prior to Admission medications   Medication Sig Start Date End Date Taking? Authorizing Provider  acetaminophen (TYLENOL) 325 MG tablet Take 2 tablets (650 mg total) by mouth every 6 (six) hours as needed for mild pain. 03/03/17   Barrett, Erin R, PA-C  amLODipine (NORVASC) 10 MG tablet Take 1 tablet (10 mg total) by mouth daily. 07/03/18   Burnell Blanks, MD  aspirin EC 81 MG tablet Take 1 tablet (81 mg total) by mouth daily. 03/31/17   Dunn, Nedra Hai, PA-C  atorvastatin (LIPITOR) 40 MG tablet Take 1 tablet (40 mg total) by mouth daily. 03/23/18 08/28/18  Angiulli, Lavon Paganini, PA-C  Carboxymethylcellul-Glycerin (CLEAR EYES FOR DRY EYES) 1-0.25 % SOLN Place 1 drop into both eyes as needed.    [provider]  carvedilol (COREG) 12.5 MG tablet Take 1 tablet (12.5 mg total) by mouth 2 (two) times daily. 03/23/18   Angiulli, Lavon Paganini, PA-C  cholecalciferol (VITAMIN D) 1000 units tablet Take 1,000 Units by mouth daily.     [provider]  citalopram (CELEXA) 10 MG tablet Take 1 tablet (10 mg total) by mouth daily. 03/23/18   Angiulli, Lavon Paganini, PA-C  clopidogrel (PLAVIX) 75 MG tablet Take 1 tablet (75 mg total) by mouth daily. 03/23/18   Angiulli, Lavon Paganini, PA-C  glipiZIDE (GLUCOTROL) 5 MG  tablet Take 1 tablet (5 mg total) by mouth 2 (two) times daily before a meal. 03/23/18   Angiulli, Lavon Paganini, PA-C  Glucosamine-Chondroit-Vit C-Mn (GLUCOSAMINE CHONDR 500 COMPLEX PO) Take 1 tablet 2 (two) times daily by mouth.    [provider]  Javier Docker Oil 1000 MG CAPS Take 1,000 mg daily by mouth.    [provider]  levothyroxine (SYNTHROID, LEVOTHROID) 175 MCG tablet Take 1 tablet (175 mcg total) by mouth daily before breakfast. 03/23/18   Angiulli, Lavon Paganini, PA-C  lisinopril (PRINIVIL,ZESTRIL) 5 MG tablet Take 1 tablet (5 mg total) by mouth daily. 03/23/18   Angiulli, Lavon Paganini, PA-C  magnesium oxide (MAG-OX) 400 (241.3 Mg) MG tablet Take 1 tablet (400 mg total) by mouth 2 (two) times daily. 03/10/18   Shelly Coss, MD  meclizine (ANTIVERT) 25 MG tablet Take 1  tablet (25 mg total) by mouth 3 (three) times daily as needed for dizziness. 08/26/18   Meredith Staggers, MD  metFORMIN (GLUCOPHAGE) 500 MG tablet Take 1 tablet (500 mg total) by mouth 2 (two) times daily with a meal. 03/23/18   Angiulli, Lavon Paganini, PA-C  Multiple Vitamin (MULTIVITAMIN WITH MINERALS) TABS tablet Take 1 tablet daily by mouth.    [provider]  pantoprazole (PROTONIX) 40 MG tablet Take 1 tablet (40 mg total) by mouth daily. 03/23/18   Angiulli, Lavon Paganini, PA-C    Family History Family History  Problem Relation Age of Onset  . Heart disease Mother 39       CABG  . CVA Father     Social History Social History   Tobacco Use  . Smoking status: Former Smoker    Types: Cigarettes    Start date: 1967    Last attempt to quit: 1975    Years since quitting: 45.4  . Smokeless tobacco: Never Used  Substance Use Topics  . Alcohol use: No    Frequency: Never  . Drug use: No     Allergies   Sulfa antibiotics; Shellfish allergy; and Aggrenox [aspirin-dipyridamole er]   Review of Systems Review of Systems  Constitutional: Negative for activity change and fatigue.  HENT: Negative for  tinnitus.   Eyes: Negative for photophobia, pain and visual disturbance.  Respiratory: Negative for shortness of breath.   Cardiovascular: Negative for chest pain.  Gastrointestinal: Negative for nausea and vomiting.  Musculoskeletal: Positive for arthralgias and joint swelling. Negative for back pain, gait problem and neck pain.  Skin: Positive for wound.  Neurological: Negative for dizziness, weakness, light-headedness, numbness and headaches.  Psychiatric/Behavioral: Negative for confusion and decreased concentration.     Physical Exam Updated Vital Signs BP (!) 149/62   Pulse 79   Temp 98.6 F (37 C)   Resp 16   LMP  (LMP Unknown)   SpO2 98%   Physical Exam Vitals signs and nursing note reviewed.  Constitutional:      Appearance: She is well-developed.  HENT:     Head: Normocephalic. No raccoon eyes or Battle's sign.     Comments: Occipital scalp laceration repaired prior to arrival with staples.  Wound is closed and hemostatic.    Right Ear: Tympanic membrane, ear canal and external ear normal. No hemotympanum.     Left Ear: Tympanic membrane, ear canal and external ear normal. No hemotympanum.     Nose: Nose normal.     Mouth/Throat:     Pharynx: Uvula midline.  Eyes:     General: Lids are normal.     Extraocular Movements:     Right eye: No nystagmus.     Left eye: No nystagmus.     Conjunctiva/sclera: Conjunctivae normal.     Pupils: Pupils are equal, round, and reactive to light.     Comments: No visible hyphema noted  Neck:     Musculoskeletal: Normal range of motion and neck supple.  Cardiovascular:     Rate and Rhythm: Normal rate and regular rhythm.  Pulmonary:     Effort: Pulmonary effort is normal.     Breath sounds: Normal breath sounds.  Abdominal:     Palpations: Abdomen is soft.     Tenderness: There is no abdominal tenderness.  Musculoskeletal:        General: Tenderness present.     Right shoulder: Normal. She exhibits normal range of motion,  no tenderness and no bony  tenderness.     Left shoulder: She exhibits decreased range of motion, tenderness and bony tenderness.     Left elbow: Normal.     Left wrist: Normal.     Cervical back: She exhibits normal range of motion, no tenderness and no bony tenderness.     Thoracic back: She exhibits no tenderness and no bony tenderness.     Lumbar back: She exhibits no tenderness and no bony tenderness.     Left upper arm: She exhibits tenderness. She exhibits no bony tenderness.     Left forearm: Normal.     Left hand: Normal.     Comments: Palpable defect at L glenohumeral joint consistent with shoulder dislocation. L UE is neurovascularly intact at time of arrival with normal distal pulse and sensation. Pain worse with movement.   Skin:    General: Skin is warm and dry.  Neurological:     Mental Status: She is alert and oriented to person, place, and time.     GCS: GCS eye subscore is 4. GCS verbal subscore is 5. GCS motor subscore is 6.     Cranial Nerves: No cranial nerve deficit.     Sensory: No sensory deficit.     Coordination: Coordination normal.      ED Treatments / Results  Labs (all labs ordered are listed, but only abnormal results are displayed) Labs Reviewed  BASIC METABOLIC PANEL - Abnormal; Notable for the following components:      Result Value   Glucose, Bld 114 (*)    Creatinine, Ser 1.60 (*)    GFR calc non Af Amer 32 (*)    GFR calc Af Amer 37 (*)    All other components within normal limits  CBC - Abnormal; Notable for the following components:   WBC 14.4 (*)    HCT 35.8 (*)    Platelets 469 (*)    All other components within normal limits    EKG None  Radiology Dg Shoulder Left  Result Date: 09/02/2018 CLINICAL DATA:  Pain status post fall EXAM: LEFT SHOULDER - 2+ VIEW COMPARISON:  None. FINDINGS: There is an anterior inferior glenohumeral fracture dislocation. The humeral head demonstrates a comminuted fracture with multiple large displaced  fracture fragments measuring up to approximately 3.8 cm. There is osteopenia. IMPRESSION: Anterior inferior glenohumeral fracture dislocation. The humeral head is comminuted with multiple large displaced adjacent fracture fragments. Electronically Signed   By: Constance Holster M.D.   On: 09/02/2018 19:51    Procedures Procedures (including critical care time)  Medications Ordered in ED Medications  morphine 4 MG/ML injection 4 mg (4 mg Intravenous Given 09/02/18 2347)  propofol (DIPRIVAN) 10 mg/mL bolus/IV push 77.3 mg (40 mg Intravenous Given 09/02/18 2202)  ondansetron (ZOFRAN) injection 4 mg (4 mg Intravenous Given 09/02/18 2345)     Initial Impression / Assessment and Plan / ED Course  I have reviewed the triage vital signs and the nursing notes.  Pertinent labs & imaging results that were available during my care of the patient were reviewed by me and considered in my medical decision making (see chart for details).        Patient seen and examined. Work-up initiated. Medications ordered.   Vital signs reviewed and are as follows: BP (!) 155/75   Pulse 78   Temp 98.5 F (36.9 C) (Oral)   Resp 15   LMP  (LMP Unknown)   SpO2 100%   Patient discussed with and seen by Dr. Stark Jock.  I spoke with Dr. Victorino December who reccs reduction attempt and CT shoulder.  Also place patient in a sling. Plan to follow-up in office.   Patient sedated with propofol. Attempt at reduction by myself and Dr. Stark Jock.   11:51 PM postreduction and CT imaging reviewed.  Successful reduction.  Patient will be placed back in a sling.  Oxycodone for home.  Orthopedic follow-up given.  Dr. Stann Mainland aware of successful reduction.  Use pain medication only under direct supervision at the lowest possible dose needed to control your pain.   Patient continues to be conversant without any worsening headache, confusion.  Do not feel that she requires a head CT at this time.  In addition, left upper extremity reevaluation.   Distal sensation intact with 2+ radial pulse.  No signs of ischemia.   Final Clinical Impressions(s) / ED Diagnoses   Final diagnoses:  Dislocation of left shoulder joint, initial encounter  Other closed displaced fracture of proximal end of left humerus, initial encounter  Laceration of scalp, initial encounter   Patient with mechanical fall earlier today.  She sustained a scalp laceration which was repaired prior to arrival.  She also had a shoulder dislocation/reduction.  From a head injury standpoint, she has not had any neurologic decompensation.  She was able to be monitored for several hours post injury without any changes.  She is not on anticoagulation.  Patient required sedation and reduction of her fracture dislocation.  Orthopedic follow-up arranged.  Patient tolerated the procedure well.  Home with sling, pain medication, Ortho follow-up.   ED Discharge Orders         Ordered    oxyCODONE (ROXICODONE) 5 MG immediate release tablet  Every 6 hours PRN     09/02/18 2345           Carlisle Cater, PA-C 09/02/18 2353    Veryl Speak, MD 09/03/18 2137

## 2018-09-02 NOTE — Sedation Documentation (Signed)
Patient denies pain and is resting comfortably.  

## 2018-09-02 NOTE — ED Notes (Signed)
Pt complaining of 9-10 in your shoulder and hand has begun aching.

## 2018-09-03 NOTE — ED Notes (Signed)
Discharge instructions discussed with pt. Pt verbalized understanding. Pt stable and ambulatory. Leaving with significant other. No signature pad available.

## 2018-09-10 DIAGNOSIS — H8110 Benign paroxysmal vertigo, unspecified ear: Secondary | ICD-10-CM | POA: Diagnosis not present

## 2018-09-11 DIAGNOSIS — S42202A Unspecified fracture of upper end of left humerus, initial encounter for closed fracture: Secondary | ICD-10-CM | POA: Diagnosis not present

## 2018-09-11 DIAGNOSIS — M25512 Pain in left shoulder: Secondary | ICD-10-CM | POA: Diagnosis not present

## 2018-09-20 DIAGNOSIS — R69 Illness, unspecified: Secondary | ICD-10-CM | POA: Diagnosis not present

## 2018-09-21 ENCOUNTER — Ambulatory Visit (INDEPENDENT_AMBULATORY_CARE_PROVIDER_SITE_OTHER): Payer: Medicare HMO | Admitting: *Deleted

## 2018-09-21 DIAGNOSIS — I110 Hypertensive heart disease with heart failure: Secondary | ICD-10-CM | POA: Diagnosis not present

## 2018-09-21 DIAGNOSIS — I639 Cerebral infarction, unspecified: Secondary | ICD-10-CM

## 2018-09-21 DIAGNOSIS — I69354 Hemiplegia and hemiparesis following cerebral infarction affecting left non-dominant side: Secondary | ICD-10-CM | POA: Diagnosis not present

## 2018-09-21 LAB — CUP PACEART REMOTE DEVICE CHECK
Date Time Interrogation Session: 20200621233903
Implantable Pulse Generator Implant Date: 20191206

## 2018-09-24 DIAGNOSIS — S42202D Unspecified fracture of upper end of left humerus, subsequent encounter for fracture with routine healing: Secondary | ICD-10-CM | POA: Diagnosis not present

## 2018-09-25 ENCOUNTER — Telehealth: Payer: Self-pay

## 2018-09-25 NOTE — Telephone Encounter (Signed)
Spoke with patient to remind of missed remote transmission 

## 2018-10-01 NOTE — Progress Notes (Signed)
Carelink Summary Report / Loop Recorder 

## 2018-10-15 DIAGNOSIS — S42202D Unspecified fracture of upper end of left humerus, subsequent encounter for fracture with routine healing: Secondary | ICD-10-CM | POA: Diagnosis not present

## 2018-10-20 DIAGNOSIS — I10 Essential (primary) hypertension: Secondary | ICD-10-CM | POA: Diagnosis not present

## 2018-10-20 DIAGNOSIS — M25512 Pain in left shoulder: Secondary | ICD-10-CM | POA: Diagnosis not present

## 2018-10-20 DIAGNOSIS — M25612 Stiffness of left shoulder, not elsewhere classified: Secondary | ICD-10-CM | POA: Diagnosis not present

## 2018-10-21 ENCOUNTER — Encounter: Payer: Self-pay | Admitting: Physical Medicine & Rehabilitation

## 2018-10-21 ENCOUNTER — Encounter: Payer: Medicare HMO | Attending: Physical Medicine & Rehabilitation | Admitting: Physical Medicine & Rehabilitation

## 2018-10-21 ENCOUNTER — Other Ambulatory Visit: Payer: Self-pay

## 2018-10-21 DIAGNOSIS — R42 Dizziness and giddiness: Secondary | ICD-10-CM | POA: Diagnosis not present

## 2018-10-21 DIAGNOSIS — I69398 Other sequelae of cerebral infarction: Secondary | ICD-10-CM | POA: Diagnosis not present

## 2018-10-21 DIAGNOSIS — I69354 Hemiplegia and hemiparesis following cerebral infarction affecting left non-dominant side: Secondary | ICD-10-CM | POA: Diagnosis not present

## 2018-10-21 DIAGNOSIS — G460 Middle cerebral artery syndrome: Secondary | ICD-10-CM | POA: Insufficient documentation

## 2018-10-21 DIAGNOSIS — I110 Hypertensive heart disease with heart failure: Secondary | ICD-10-CM | POA: Diagnosis not present

## 2018-10-21 DIAGNOSIS — S42292S Other displaced fracture of upper end of left humerus, sequela: Secondary | ICD-10-CM | POA: Diagnosis not present

## 2018-10-21 DIAGNOSIS — I63411 Cerebral infarction due to embolism of right middle cerebral artery: Secondary | ICD-10-CM

## 2018-10-21 MED ORDER — MECLIZINE HCL 25 MG PO TABS: 25.0000 mg | ORAL_TABLET | Freq: Three times a day (TID) | ORAL | 1 refills | Status: DC | PRN

## 2018-10-21 NOTE — Patient Instructions (Signed)
PLEASE FEEL FREE TO CALL OUR OFFICE WITH ANY PROBLEMS OR QUESTIONS ([phone])      

## 2018-10-21 NOTE — Progress Notes (Signed)
Subjective:    Patient ID: Theresa Ray, female    DOB: 01-27-1946, 73 y.o.   MRN: [id number]  HPI   Mrs. Minium is here in follow up of her CVA. She had a fall on June 3 and dislocated and fractured her left shoulder. She just started therapy this week at [facility] [place]. She has been followed by ortho. She is still having pain and weakness in the arm. Prior to the fall she was ambulating in the house.   Her vertigo has improved. She never received therpay but the meclizine really helped the symptoms and her attacks are rare now.     Pain Inventory Average Pain 7 Pain Right Now 7 My pain is aching  In the last 24 hours, has pain interfered with the following? General activity 5 Relation with others 5 Enjoyment of life 5 What TIME of day is your pain at its worst? daytime Sleep (in general) Fair  Pain is worse with: walking Pain improves with: heat/ice and medication Relief from Meds: 8  Mobility walk without assistance walk with assistance ability to climb steps?  yes do you drive?  no use a wheelchair  Function retired I need assistance with the following:  meal prep, household duties and shopping  Neuro/Psych trouble walking confusion depression  Prior Studies Any changes since last visit?  no  Physicians involved in your care Any changes since last visit?  no   Family History  Problem Relation Age of Onset  . Heart disease Mother 72       CABG  . CVA Father    Social History   Socioeconomic History  . Marital status: Married    Spouse name: Not on file  . Number of children: Not on file  . Years of education: Not on file  . Highest education level: Not on file  Occupational History  . Not on file  Social Needs  . Financial resource strain: Not on file  . Food insecurity    Worry: Not on file    Inability: Not on file  . Transportation needs    Medical: Not on file    Non-medical: Not on file  Tobacco Use  . Smoking status:  Former Smoker    Types: Cigarettes    Start date: 1967    Quit date: 1975    Years since quitting: 45.5  . Smokeless tobacco: Never Used  Substance and Sexual Activity  . Alcohol use: No    Frequency: Never  . Drug use: No  . Sexual activity: Not on file  Lifestyle  . Physical activity    Days per week: Not on file    Minutes per session: Not on file  . Stress: Not on file  Relationships  . Social Herbalist on phone: Not on file    Gets together: Not on file    Attends religious service: Not on file    Active member of club or organization: Not on file    Attends meetings of clubs or organizations: Not on file    Relationship status: Not on file  Other Topics Concern  . Not on file  Social History Narrative  . Not on file   Past Surgical History:  Procedure Laterality Date  . ABDOMINAL HYSTERECTOMY    . CORONARY ARTERY BYPASS GRAFT N/A 02/24/2017   Procedure: CORONARY ARTERY BYPASS GRAFTING (CABG) USING LEFT INTERNAL MAMMARY ARTERY TO LAD AND ENDOSCOPICALLY HARVESTED GREATER SAPHENOUS VEIN TO  PDA AND TO OM1.;  Surgeon: Grace Isaac, MD;  Location: [facility];  Service: Open Heart Surgery;  Laterality: N/A;  . ENDOVEIN HARVEST OF GREATER SAPHENOUS VEIN Right 02/24/2017   Procedure: ENDOVEIN HARVEST OF GREATER SAPHENOUS VEIN;  Surgeon: Grace Isaac, MD;  Location: [facility];  Service: Open Heart Surgery;  Laterality: Right;  . LAPAROSCOPIC GASTRIC BANDING    . LEFT HEART CATH AND CORONARY ANGIOGRAPHY N/A 02/18/2017   Procedure: LEFT HEART CATH AND CORONARY ANGIOGRAPHY;  Surgeon: Leonie Man, MD;  Location: [facility] LAB;  Service: Cardiovascular;  Laterality: N/A;  . LOOP RECORDER INSERTION N/A 03/06/2018   Procedure: LOOP RECORDER INSERTION;  Surgeon: Constance Haw, MD;  Location: [facility];  Service: Cardiovascular;  Laterality: N/A;  . TEE WITHOUT CARDIOVERSION N/A 02/24/2017   Procedure: TRANSESOPHAGEAL ECHOCARDIOGRAM (TEE);  Surgeon:  Grace Isaac, MD;  Location: [facility];  Service: Open Heart Surgery;  Laterality: N/A;  . TEE WITHOUT CARDIOVERSION N/A 03/06/2018   Procedure: TRANSESOPHAGEAL ECHOCARDIOGRAM (TEE);  Surgeon: Dorothy Spark, MD;  Location: [facility] ENDOSCOPY;  Service: Cardiovascular;  Laterality: N/A;  loop   Past Medical History:  Diagnosis Date  . CAD in native artery    a. s/p CABGx3 in 01/2017.  Marland Kitchen Chronic diastolic CHF (congestive heart failure) ([place])   . Diabetes mellitus ([place])   . Diabetic neuropathy ([place])   . History of kidney stones    history of stones  . Hypertension   . Hypothyroidism   . NSTEMI (non-ST elevated myocardial infarction) ([place])   . Orthostatic hypotension   . Postoperative atrial fibrillation ([place])    a. after CABG 01/2017.  Marland Kitchen TIA (transient ischemic attack)    BP 112/68   Pulse 69   Temp (!) 97 F (36.1 C) (Tympanic)   Ht 5\' 3"  (1.6 m)   Wt 170 lb (77.1 kg)   LMP  (LMP Unknown)   SpO2 98%   BMI 30.11 kg/m   Opioid Risk Score:   Fall Risk Score:  `1  Depression screen PHQ 2/9  Depression screen [facility] 2/9 05/06/2018 04/07/2018  Decreased Interest 3 0  Down, Depressed, Hopeless 3 0  PHQ - 2 Score 6 0  Altered sleeping 2 1  Tired, decreased energy 3 1  Change in appetite 3 0  Feeling bad or failure about yourself  2 0  Trouble concentrating 2 1  Moving slowly or fidgety/restless 0 1  Suicidal thoughts 2 0  PHQ-9 Score 20 4  Difficult doing work/chores Not difficult at all Not difficult at all     Review of Systems  Constitutional: Positive for unexpected weight change.  HENT: Negative.   Eyes: Negative.   Respiratory: Negative.   Cardiovascular: Negative.   Gastrointestinal: Negative.   Endocrine: Negative.   Genitourinary: Negative.   Musculoskeletal: Positive for arthralgias and gait problem.  Skin: Negative.   Allergic/Immunologic: Negative.   Hematological: Negative.   Psychiatric/Behavioral: Positive for confusion and dysphoric mood.  All other  systems reviewed and are negative.      Objective:   Physical Exam  General: No acute distress HEENT: EOMI, oral membranes moist Cards: reg rate  Chest: normal effort Abdomen: Soft, NT, ND Extremities: no edema Skin:Clean and intact without signs of breakdown Neuro:Pt with improved memory, insight and awareness.  Sensory exam is normal. Reflexes are 2+ in all 4's. Fine motor coordination is intact. No tremors. Motor function is grossly5 out of 5 right upper extremity. left  upper extremity limited by pain. BLE grossly 4 out of 5.  Musculoskeletal:near normal ROM. Unable to lift left shoulder due to weakness and pain. Pain with PROM.  Psych:Pt's affect is appropriate. 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 Hypertension: PER Primary            -avoid excessive hypotension 3. Type 2 diabetes: Per primary care physician. 4. History of hydronephrosis with urinary obstruction due to renal calculus: Patient status post double-J stents. Urology has followed patient. 5. Right rotator cuff injury/tear, new left humerus fx, may have RTC injury there as well -continue with outpt therapies and HEP as described.                60minutes was spent with the patient in the office today. I will see her back in about 4 months time.

## 2018-10-23 ENCOUNTER — Ambulatory Visit (INDEPENDENT_AMBULATORY_CARE_PROVIDER_SITE_OTHER): Payer: Medicare HMO | Admitting: *Deleted

## 2018-10-23 DIAGNOSIS — I639 Cerebral infarction, unspecified: Secondary | ICD-10-CM

## 2018-10-23 DIAGNOSIS — M25512 Pain in left shoulder: Secondary | ICD-10-CM | POA: Diagnosis not present

## 2018-10-23 DIAGNOSIS — I4891 Unspecified atrial fibrillation: Secondary | ICD-10-CM

## 2018-10-23 DIAGNOSIS — I9789 Other postprocedural complications and disorders of the circulatory system, not elsewhere classified: Secondary | ICD-10-CM

## 2018-10-23 DIAGNOSIS — M25612 Stiffness of left shoulder, not elsewhere classified: Secondary | ICD-10-CM | POA: Diagnosis not present

## 2018-10-24 LAB — CUP PACEART REMOTE DEVICE CHECK
Date Time Interrogation Session: 20200725001203
Implantable Pulse Generator Implant Date: 20191206

## 2018-10-27 DIAGNOSIS — M25512 Pain in left shoulder: Secondary | ICD-10-CM | POA: Diagnosis not present

## 2018-10-27 DIAGNOSIS — M25612 Stiffness of left shoulder, not elsewhere classified: Secondary | ICD-10-CM | POA: Diagnosis not present

## 2018-10-29 DIAGNOSIS — N201 Calculus of ureter: Secondary | ICD-10-CM | POA: Diagnosis not present

## 2018-10-30 DIAGNOSIS — M25612 Stiffness of left shoulder, not elsewhere classified: Secondary | ICD-10-CM | POA: Diagnosis not present

## 2018-10-30 DIAGNOSIS — M25512 Pain in left shoulder: Secondary | ICD-10-CM | POA: Diagnosis not present

## 2018-11-02 NOTE — Progress Notes (Signed)
Carelink Summary Report / Loop Recorder 

## 2018-11-03 DIAGNOSIS — M25512 Pain in left shoulder: Secondary | ICD-10-CM | POA: Diagnosis not present

## 2018-11-03 DIAGNOSIS — M25612 Stiffness of left shoulder, not elsewhere classified: Secondary | ICD-10-CM | POA: Diagnosis not present

## 2018-11-06 DIAGNOSIS — M25612 Stiffness of left shoulder, not elsewhere classified: Secondary | ICD-10-CM | POA: Diagnosis not present

## 2018-11-06 DIAGNOSIS — M25512 Pain in left shoulder: Secondary | ICD-10-CM | POA: Diagnosis not present

## 2018-11-10 DIAGNOSIS — M25612 Stiffness of left shoulder, not elsewhere classified: Secondary | ICD-10-CM | POA: Diagnosis not present

## 2018-11-10 DIAGNOSIS — M25512 Pain in left shoulder: Secondary | ICD-10-CM | POA: Diagnosis not present

## 2018-11-13 DIAGNOSIS — M25612 Stiffness of left shoulder, not elsewhere classified: Secondary | ICD-10-CM | POA: Diagnosis not present

## 2018-11-13 DIAGNOSIS — M25512 Pain in left shoulder: Secondary | ICD-10-CM | POA: Diagnosis not present

## 2018-11-17 DIAGNOSIS — M25612 Stiffness of left shoulder, not elsewhere classified: Secondary | ICD-10-CM | POA: Diagnosis not present

## 2018-11-17 DIAGNOSIS — M25512 Pain in left shoulder: Secondary | ICD-10-CM | POA: Diagnosis not present

## 2018-11-20 DIAGNOSIS — M25512 Pain in left shoulder: Secondary | ICD-10-CM | POA: Diagnosis not present

## 2018-11-20 DIAGNOSIS — M25612 Stiffness of left shoulder, not elsewhere classified: Secondary | ICD-10-CM | POA: Diagnosis not present

## 2018-11-21 DIAGNOSIS — S0990XA Unspecified injury of head, initial encounter: Secondary | ICD-10-CM | POA: Diagnosis not present

## 2018-11-21 DIAGNOSIS — I110 Hypertensive heart disease with heart failure: Secondary | ICD-10-CM | POA: Diagnosis not present

## 2018-11-21 DIAGNOSIS — S199XXA Unspecified injury of neck, initial encounter: Secondary | ICD-10-CM | POA: Diagnosis not present

## 2018-11-21 DIAGNOSIS — M5030 Other cervical disc degeneration, unspecified cervical region: Secondary | ICD-10-CM | POA: Diagnosis not present

## 2018-11-21 DIAGNOSIS — I69354 Hemiplegia and hemiparesis following cerebral infarction affecting left non-dominant side: Secondary | ICD-10-CM | POA: Diagnosis not present

## 2018-11-21 DIAGNOSIS — S161XXA Strain of muscle, fascia and tendon at neck level, initial encounter: Secondary | ICD-10-CM | POA: Diagnosis not present

## 2018-11-21 DIAGNOSIS — G44319 Acute post-traumatic headache, not intractable: Secondary | ICD-10-CM | POA: Diagnosis not present

## 2018-11-25 ENCOUNTER — Ambulatory Visit (INDEPENDENT_AMBULATORY_CARE_PROVIDER_SITE_OTHER): Payer: Medicare HMO | Admitting: *Deleted

## 2018-11-25 DIAGNOSIS — I639 Cerebral infarction, unspecified: Secondary | ICD-10-CM | POA: Diagnosis not present

## 2018-11-26 LAB — CUP PACEART REMOTE DEVICE CHECK
Date Time Interrogation Session: 20200827004045
Implantable Pulse Generator Implant Date: 20191206

## 2018-11-27 DIAGNOSIS — M25512 Pain in left shoulder: Secondary | ICD-10-CM | POA: Diagnosis not present

## 2018-11-27 DIAGNOSIS — M25612 Stiffness of left shoulder, not elsewhere classified: Secondary | ICD-10-CM | POA: Diagnosis not present

## 2018-12-01 DIAGNOSIS — M25612 Stiffness of left shoulder, not elsewhere classified: Secondary | ICD-10-CM | POA: Diagnosis not present

## 2018-12-01 DIAGNOSIS — M25512 Pain in left shoulder: Secondary | ICD-10-CM | POA: Diagnosis not present

## 2018-12-02 DIAGNOSIS — S42202D Unspecified fracture of upper end of left humerus, subsequent encounter for fracture with routine healing: Secondary | ICD-10-CM | POA: Diagnosis not present

## 2018-12-04 DIAGNOSIS — M25512 Pain in left shoulder: Secondary | ICD-10-CM | POA: Diagnosis not present

## 2018-12-04 DIAGNOSIS — M25612 Stiffness of left shoulder, not elsewhere classified: Secondary | ICD-10-CM | POA: Diagnosis not present

## 2018-12-04 NOTE — Progress Notes (Signed)
Carelink Summary Report / Loop Recorder 

## 2018-12-16 DIAGNOSIS — R69 Illness, unspecified: Secondary | ICD-10-CM | POA: Diagnosis not present

## 2018-12-22 DIAGNOSIS — I69354 Hemiplegia and hemiparesis following cerebral infarction affecting left non-dominant side: Secondary | ICD-10-CM | POA: Diagnosis not present

## 2018-12-22 DIAGNOSIS — I110 Hypertensive heart disease with heart failure: Secondary | ICD-10-CM | POA: Diagnosis not present

## 2018-12-28 ENCOUNTER — Ambulatory Visit (INDEPENDENT_AMBULATORY_CARE_PROVIDER_SITE_OTHER): Payer: Medicare HMO | Admitting: *Deleted

## 2018-12-28 DIAGNOSIS — I9789 Other postprocedural complications and disorders of the circulatory system, not elsewhere classified: Secondary | ICD-10-CM

## 2018-12-28 DIAGNOSIS — I639 Cerebral infarction, unspecified: Secondary | ICD-10-CM | POA: Diagnosis not present

## 2018-12-28 DIAGNOSIS — I4891 Unspecified atrial fibrillation: Secondary | ICD-10-CM

## 2018-12-29 LAB — CUP PACEART REMOTE DEVICE CHECK
Date Time Interrogation Session: 20200929184514
Implantable Pulse Generator Implant Date: 20191206

## 2018-12-30 DIAGNOSIS — Z0000 Encounter for general adult medical examination without abnormal findings: Secondary | ICD-10-CM | POA: Diagnosis not present

## 2018-12-30 DIAGNOSIS — Z23 Encounter for immunization: Secondary | ICD-10-CM | POA: Diagnosis not present

## 2018-12-30 DIAGNOSIS — Z79899 Other long term (current) drug therapy: Secondary | ICD-10-CM | POA: Diagnosis not present

## 2018-12-30 DIAGNOSIS — E039 Hypothyroidism, unspecified: Secondary | ICD-10-CM | POA: Diagnosis not present

## 2018-12-30 DIAGNOSIS — Z1331 Encounter for screening for depression: Secondary | ICD-10-CM | POA: Diagnosis not present

## 2018-12-30 DIAGNOSIS — E1165 Type 2 diabetes mellitus with hyperglycemia: Secondary | ICD-10-CM | POA: Diagnosis not present

## 2018-12-30 DIAGNOSIS — Z6829 Body mass index (BMI) 29.0-29.9, adult: Secondary | ICD-10-CM | POA: Diagnosis not present

## 2018-12-31 ENCOUNTER — Other Ambulatory Visit: Payer: Self-pay | Admitting: Cardiovascular Disease

## 2019-01-06 NOTE — Progress Notes (Signed)
Carelink Summary Report / Loop Recorder 

## 2019-01-13 DIAGNOSIS — I509 Heart failure, unspecified: Secondary | ICD-10-CM | POA: Diagnosis not present

## 2019-01-13 DIAGNOSIS — R079 Chest pain, unspecified: Secondary | ICD-10-CM | POA: Diagnosis not present

## 2019-01-13 DIAGNOSIS — Z8701 Personal history of pneumonia (recurrent): Secondary | ICD-10-CM | POA: Diagnosis not present

## 2019-01-13 DIAGNOSIS — I2510 Atherosclerotic heart disease of native coronary artery without angina pectoris: Secondary | ICD-10-CM | POA: Diagnosis not present

## 2019-01-13 DIAGNOSIS — R0789 Other chest pain: Secondary | ICD-10-CM | POA: Diagnosis not present

## 2019-01-13 DIAGNOSIS — I110 Hypertensive heart disease with heart failure: Secondary | ICD-10-CM | POA: Diagnosis not present

## 2019-01-13 DIAGNOSIS — Z951 Presence of aortocoronary bypass graft: Secondary | ICD-10-CM | POA: Diagnosis not present

## 2019-01-13 DIAGNOSIS — S42202D Unspecified fracture of upper end of left humerus, subsequent encounter for fracture with routine healing: Secondary | ICD-10-CM | POA: Diagnosis not present

## 2019-01-13 DIAGNOSIS — J449 Chronic obstructive pulmonary disease, unspecified: Secondary | ICD-10-CM | POA: Diagnosis not present

## 2019-01-13 DIAGNOSIS — I252 Old myocardial infarction: Secondary | ICD-10-CM | POA: Diagnosis not present

## 2019-01-13 DIAGNOSIS — Z8673 Personal history of transient ischemic attack (TIA), and cerebral infarction without residual deficits: Secondary | ICD-10-CM | POA: Diagnosis not present

## 2019-01-14 ENCOUNTER — Telehealth: Payer: Self-pay | Admitting: Cardiovascular Disease

## 2019-01-14 NOTE — Telephone Encounter (Signed)
Patient wants to make Dr. Angelena Form aware she had to go to [facility] last night because she was having chest pain.  Everything check out ok, they did blood work and an EKG, every check out normal.

## 2019-01-14 NOTE — Telephone Encounter (Signed)
I left a message to f/u with her hospital visit on 10/14.  I told her that I would forward to Dr Angelena Form.

## 2019-01-14 NOTE — Telephone Encounter (Signed)
Thanks. Can we let her know to call us if she has more chest pain and we can get her into the office sooner than her appt in January. Thanks, chris

## 2019-01-14 NOTE — Telephone Encounter (Signed)
I spoke to the patient and informed her of Dr Camillia Herter recommendation.  She verbalized understanding and will keep [place] posted over the next few weeks.

## 2019-01-28 LAB — CUP PACEART REMOTE DEVICE CHECK
Date Time Interrogation Session: 20201029122208
Implantable Pulse Generator Implant Date: 20191206

## 2019-01-31 LAB — CUP PACEART REMOTE DEVICE CHECK
Date Time Interrogation Session: 20201101184509
Implantable Pulse Generator Implant Date: 20191206

## 2019-02-01 ENCOUNTER — Ambulatory Visit (INDEPENDENT_AMBULATORY_CARE_PROVIDER_SITE_OTHER): Payer: Medicare HMO | Admitting: *Deleted

## 2019-02-01 DIAGNOSIS — I639 Cerebral infarction, unspecified: Secondary | ICD-10-CM | POA: Diagnosis not present

## 2019-02-02 DIAGNOSIS — L710 Perioral dermatitis: Secondary | ICD-10-CM | POA: Diagnosis not present

## 2019-02-24 ENCOUNTER — Encounter: Payer: Medicare HMO | Attending: Physical Medicine & Rehabilitation | Admitting: Physical Medicine & Rehabilitation

## 2019-03-01 NOTE — Progress Notes (Signed)
Carelink Summary Report / Loop Recorder 

## 2019-03-02 DIAGNOSIS — Y92009 Unspecified place in unspecified non-institutional (private) residence as the place of occurrence of the external cause: Secondary | ICD-10-CM | POA: Diagnosis not present

## 2019-03-02 DIAGNOSIS — Z6829 Body mass index (BMI) 29.0-29.9, adult: Secondary | ICD-10-CM | POA: Diagnosis not present

## 2019-03-02 DIAGNOSIS — M160 Bilateral primary osteoarthritis of hip: Secondary | ICD-10-CM | POA: Diagnosis not present

## 2019-03-02 DIAGNOSIS — I7090 Unspecified atherosclerosis: Secondary | ICD-10-CM | POA: Diagnosis not present

## 2019-03-02 DIAGNOSIS — M545 Low back pain: Secondary | ICD-10-CM | POA: Diagnosis not present

## 2019-03-02 DIAGNOSIS — M539 Dorsopathy, unspecified: Secondary | ICD-10-CM | POA: Diagnosis not present

## 2019-03-02 DIAGNOSIS — M47817 Spondylosis without myelopathy or radiculopathy, lumbosacral region: Secondary | ICD-10-CM | POA: Diagnosis not present

## 2019-03-02 DIAGNOSIS — W19XXXA Unspecified fall, initial encounter: Secondary | ICD-10-CM | POA: Diagnosis not present

## 2019-03-02 DIAGNOSIS — M954 Acquired deformity of chest and rib: Secondary | ICD-10-CM | POA: Diagnosis not present

## 2019-03-03 ENCOUNTER — Telehealth: Payer: Self-pay | Admitting: *Deleted

## 2019-03-03 NOTE — Telephone Encounter (Signed)
Spoke with pt's family member. He agreed to send manual [facility] transmission after pt gets back in bed this afternoon. No further questions at this time.  Need manual to review any un-transmitted "AF" episodes.

## 2019-03-04 DIAGNOSIS — R300 Dysuria: Secondary | ICD-10-CM | POA: Diagnosis not present

## 2019-03-04 DIAGNOSIS — R809 Proteinuria, unspecified: Secondary | ICD-10-CM | POA: Diagnosis not present

## 2019-03-04 DIAGNOSIS — M545 Low back pain: Secondary | ICD-10-CM | POA: Diagnosis not present

## 2019-03-04 DIAGNOSIS — R319 Hematuria, unspecified: Secondary | ICD-10-CM | POA: Diagnosis not present

## 2019-03-04 NOTE — Telephone Encounter (Signed)
Transmission received. All episodes false.   Chanetta Marshall, NP 03/04/2019 6:32 AM

## 2019-03-05 ENCOUNTER — Ambulatory Visit (INDEPENDENT_AMBULATORY_CARE_PROVIDER_SITE_OTHER): Payer: Medicare HMO | Admitting: *Deleted

## 2019-03-05 DIAGNOSIS — I639 Cerebral infarction, unspecified: Secondary | ICD-10-CM | POA: Diagnosis not present

## 2019-03-06 LAB — CUP PACEART REMOTE DEVICE CHECK
Date Time Interrogation Session: 20201204134738
Implantable Pulse Generator Implant Date: 20191206

## 2019-03-16 DIAGNOSIS — L710 Perioral dermatitis: Secondary | ICD-10-CM | POA: Diagnosis not present

## 2019-03-18 DIAGNOSIS — S42202D Unspecified fracture of upper end of left humerus, subsequent encounter for fracture with routine healing: Secondary | ICD-10-CM | POA: Diagnosis not present

## 2019-03-22 ENCOUNTER — Encounter: Payer: Self-pay | Admitting: Gastroenterology

## 2019-03-23 DIAGNOSIS — R69 Illness, unspecified: Secondary | ICD-10-CM | POA: Diagnosis not present

## 2019-04-07 ENCOUNTER — Ambulatory Visit (INDEPENDENT_AMBULATORY_CARE_PROVIDER_SITE_OTHER): Payer: Medicare HMO | Admitting: *Deleted

## 2019-04-07 DIAGNOSIS — I639 Cerebral infarction, unspecified: Secondary | ICD-10-CM

## 2019-04-07 LAB — CUP PACEART REMOTE DEVICE CHECK
Date Time Interrogation Session: 20210106135950
Implantable Pulse Generator Implant Date: 20191206

## 2019-04-14 NOTE — Progress Notes (Addendum)
Cardiology Office Note    Date:  04/20/2019   ID:  Theresa Ray, DOB Aug 26, 1945, MRN [id number]  PCP:  Ronita Hipps, MD  Cardiologist: Lauree Chandler, MD EPS: None  Chief Complaint  Patient presents with  . Follow-up    History of Present Illness:  Theresa Ray is a 74 y.o. female with history of CAD status post CABG times 311/26/18 LIMA to the LAD, SVG to PDA, SVG to OM1, postop A. fib converted to normal sinus rhythm.  Several episodes of syncope post discharge felt to be hypovolemic, echo 01/2017 LVEF 55 to 60% with mild MR, cardiac monitor 01/2018 no evidence of A. fib, CVA 03/2018, TEE 03/06/2018 no intracardiac thrombus no PFO or ASD.  Loop recorder was placed.  Also has chronic diastolic CHF, hypertension, DM.  Last had a telemedicine visit with Dr. Angelena Form 08/28/2018 no changes made.  Patient was admitted to [facility] with chest pain 12/2018 but said everything checked out normal.  Patient says when she went to bed she felt her heart racing, no chest pain when she went to [place] ER. Wasn't admitted.Hasn't had any episodes since then. No chest pain, shortness of breath, dizziness or presyncope. Says she falls a lot-trips over things.   Past Medical History:  Diagnosis Date  . CAD in native artery    a. s/p CABGx3 in 01/2017.  Marland Kitchen Chronic diastolic CHF (congestive heart failure) ([place])   . Diabetes mellitus ([place])   . Diabetic neuropathy ([place])   . History of kidney stones    history of stones  . Hypertension   . Hypothyroidism   . NSTEMI (non-ST elevated myocardial infarction) ([place])   . Orthostatic hypotension   . Postoperative atrial fibrillation ([place])    a. after CABG 01/2017.  Marland Kitchen TIA (transient ischemic attack)     Past Surgical History:  Procedure Laterality Date  . ABDOMINAL HYSTERECTOMY    . CORONARY ARTERY BYPASS GRAFT N/A 02/24/2017   Procedure: CORONARY ARTERY BYPASS GRAFTING (CABG) USING LEFT INTERNAL MAMMARY ARTERY TO LAD AND  ENDOSCOPICALLY HARVESTED GREATER SAPHENOUS VEIN TO PDA AND TO OM1.;  Surgeon: Grace Isaac, MD;  Location: [place];  Service: Open Heart Surgery;  Laterality: N/A;  . ENDOVEIN HARVEST OF GREATER SAPHENOUS VEIN Right 02/24/2017   Procedure: ENDOVEIN HARVEST OF GREATER SAPHENOUS VEIN;  Surgeon: Grace Isaac, MD;  Location: [place];  Service: Open Heart Surgery;  Laterality: Right;  . LAPAROSCOPIC GASTRIC BANDING    . LEFT HEART CATH AND CORONARY ANGIOGRAPHY N/A 02/18/2017   Procedure: LEFT HEART CATH AND CORONARY ANGIOGRAPHY;  Surgeon: Leonie Man, MD;  Location: [place] CV LAB;  Service: Cardiovascular;  Laterality: N/A;  . LOOP RECORDER INSERTION N/A 03/06/2018   Procedure: LOOP RECORDER INSERTION;  Surgeon: Constance Haw, MD;  Location: [place] [facility];  Service: Cardiovascular;  Laterality: N/A;  . TEE WITHOUT CARDIOVERSION N/A 02/24/2017   Procedure: TRANSESOPHAGEAL ECHOCARDIOGRAM (TEE);  Surgeon: Grace Isaac, MD;  Location: [place];  Service: Open Heart Surgery;  Laterality: N/A;  . TEE WITHOUT CARDIOVERSION N/A 03/06/2018   Procedure: TRANSESOPHAGEAL ECHOCARDIOGRAM (TEE);  Surgeon: Dorothy Spark, MD;  Location: [facility] ENDOSCOPY;  Service: Cardiovascular;  Laterality: N/A;  loop    Current Medications: Current Meds  Medication Sig  . acetaminophen (TYLENOL) 325 MG tablet Take 2 tablets (650 mg total) by mouth every 6 (six) hours as needed for mild pain.  Marland Kitchen amLODipine (NORVASC) 10 MG tablet TAKE 1 TABLET  BY MOUTH EVERY DAY  . aspirin EC 81 MG tablet Take 1 tablet (81 mg total) by mouth daily.  Marland Kitchen atorvastatin (LIPITOR) 40 MG tablet Take 1 tablet (40 mg total) by mouth daily.  . Carboxymethylcellul-Glycerin (CLEAR EYES FOR DRY EYES) 1-0.25 % SOLN Place 1 drop into both eyes as needed.  . carvedilol (COREG) 12.5 MG tablet Take 1 tablet (12.5 mg total) by mouth 2 (two) times daily.  . cholecalciferol (VITAMIN D) 1000 units tablet Take 1,000 Units by mouth daily.     . citalopram (CELEXA) 10 MG tablet Take 1 tablet (10 mg total) by mouth daily.  . clopidogrel (PLAVIX) 75 MG tablet Take 1 tablet (75 mg total) by mouth daily.  Marland Kitchen glipiZIDE (GLUCOTROL) 5 MG tablet Take 1 tablet (5 mg total) by mouth 2 (two) times daily before a meal.  . Glucosamine-Chondroit-Vit C-Mn (GLUCOSAMINE CHONDR 500 COMPLEX PO) Take 1 tablet 2 (two) times daily by mouth.  Javier Docker Oil 1000 MG CAPS Take 1,000 mg daily by mouth.  . levothyroxine (SYNTHROID, LEVOTHROID) 175 MCG tablet Take 1 tablet (175 mcg total) by mouth daily before breakfast.  . lisinopril (PRINIVIL,ZESTRIL) 5 MG tablet Take 1 tablet (5 mg total) by mouth daily.  . magnesium oxide (MAG-OX) 400 (241.3 Mg) MG tablet Take 1 tablet (400 mg total) by mouth 2 (two) times daily.  . meclizine (ANTIVERT) 25 MG tablet Take 1 tablet (25 mg total) by mouth 3 (three) times daily as needed for dizziness.  . metFORMIN (GLUCOPHAGE) 500 MG tablet Take 1 tablet (500 mg total) by mouth 2 (two) times daily with a meal.  . Multiple Vitamin (MULTIVITAMIN WITH MINERALS) TABS tablet Take 1 tablet daily by mouth.  . oxyCODONE (ROXICODONE) 5 MG immediate release tablet Take 0.5-1 tablets (2.5-5 mg total) by mouth every 6 (six) hours as needed for severe pain.  . pantoprazole (PROTONIX) 40 MG tablet Take 1 tablet (40 mg total) by mouth daily.     Allergies:   Sulfa antibiotics, Shellfish allergy, and Aggrenox [aspirin-dipyridamole er]   Social History   Socioeconomic History  . Marital status: Married    Spouse name: Not on file  . Number of children: Not on file  . Years of education: Not on file  . Highest education level: Not on file  Occupational History  . Not on file  Tobacco Use  . Smoking status: Former Smoker    Types: Cigarettes    Start date: 1967    Quit date: 1975    Years since quitting: 46.0  . Smokeless tobacco: Never Used  Substance and Sexual Activity  . Alcohol use: No  . Drug use: No  . Sexual activity: Not on  file  Other Topics Concern  . Not on file  Social History Narrative  . Not on file   Social Determinants of Health   Financial Resource Strain:   . Difficulty of Paying Living Expenses: Not on file  Food Insecurity:   . Worried About Charity fundraiser in the Last Year: Not on file  . Ran Out of Food in the Last Year: Not on file  Transportation Needs:   . Lack of Transportation (Medical): Not on file  . Lack of Transportation (Non-Medical): Not on file  Physical Activity:   . Days of Exercise per Week: Not on file  . Minutes of Exercise per Session: Not on file  Stress:   . Feeling of Stress : Not on file  Social Connections:   .  Frequency of Communication with Friends and Family: Not on file  . Frequency of Social Gatherings with Friends and Family: Not on file  . Attends Religious Services: Not on file  . Active Member of Clubs or Organizations: Not on file  . Attends Archivist Meetings: Not on file  . Marital Status: Not on file     Family History:  The patient's   family history includes CVA in her father; Heart disease (age of onset: 65) in her mother.   ROS:   Please see the history of present illness.    ROS All other systems reviewed and are negative.   PHYSICAL EXAM:   VS:  BP 130/72   Pulse 81   Ht 5\' 3"  (1.6 m)   Wt 173 lb (78.5 kg)   LMP  (LMP Unknown)   SpO2 99%   BMI 30.65 kg/m   Physical Exam  GEN: Well nourished, well developed, in no acute distress  Neck: no JVD, carotid bruits, or masses Cardiac:RRR, some skipping; no murmurs, rubs, or gallops  Respiratory:  clear to auscultation bilaterally, normal work of breathing GI: soft, nontender, nondistended, + BS Ext: without cyanosis, clubbing, or edema, Good distal pulses bilaterally Neuro:  Alert and Oriented x 3 Psych: euthymic mood, full affect  Wt Readings from Last 3 Encounters:  04/20/19 173 lb (78.5 kg)  10/21/18 170 lb (77.1 kg)  08/28/18 170 lb 6.4 oz (77.3 kg)       Studies/Labs Reviewed:   EKG:  EKG is  ordered today.  The ekg ordered today demonstrates normal sinus rhythm with PVCs nonspecific ST-T wave changes, no acute change  Recent Labs: 09/02/2018: BUN 20; Creatinine, Ser 1.60; Hemoglobin 12.0; Platelets 469; Potassium 4.0; Sodium 138   Lipid Panel    Component Value Date/Time   CHOL 167 03/03/2018 1046   TRIG 157 (H) 03/03/2018 1046   HDL 37 (L) 03/03/2018 1046   CHOLHDL 4.5 03/03/2018 1046   VLDL 31 03/03/2018 1046   LDLCALC 99 03/03/2018 1046    Additional studies/ records that were reviewed today include:  TEE 12/6/2019Study Conclusions   - Left ventricle: Systolic function was normal. The estimated   ejection fraction was in the range of 50% to 55%. - Aortic valve: No evidence of vegetation. - Mitral valve: There was mild regurgitation. - Left atrium: No evidence of thrombus in the atrial cavity or   appendage. No evidence of thrombus in the atrial cavity or   appendage. - Right atrium: No evidence of thrombus in the atrial cavity or   appendage. - Atrial septum: No defect or patent foramen ovale was identified. - Pulmonary arteries: Systolic pressure was mildly increased. PA   peak pressure: 35 mm Hg (S).   Impressions:   - No intracardiac source of embolism, negative bubble study, no PFO   or ASD.       ASSESSMENT:    1. Coronary artery disease involving coronary bypass graft of native heart without angina pectoris   2. Paroxysmal atrial fibrillation (HCC)   3. History of CVA (cerebrovascular accident)   4. Chronic diastolic CHF (congestive heart failure) ([place])   5. Essential hypertension   6. Hyperlipidemia, unspecified hyperlipidemia type      PLAN:  In order of problems listed above:  CAD status post CABG 2018-no angina  Postop atrial fibrillation no known recurrence currently has a loop recorder-looks like she's had some Afib but will verify with Amber. On Plavix for CVA but no eliquis.  CHADSVASC=8. She also falls a lot. Addendum: Amber reviewed loop recorder and says all the alerts were artifact and no Afib has been identified yet.  CVA 03/2018 Loop recorder in place-looks like she's had some Afib but will verify with [employer]. On Plavix for CVA  Chronic diastolic CHF compensated  Essential hypertension well-controlled  Hyperlipidemia LDL 58 12/2018   Medication Adjustments/Labs and Tests Ordered: Current medicines are reviewed at length with the patient today.  Concerns regarding medicines are outlined above.  Medication changes, Labs and Tests ordered today are listed in the Patient Instructions below. Patient Instructions  Your physician recommends that you continue on your current medications as directed. Please refer to the Current Medication list given to you today.  Your physician wants you to follow-up in: [place] will receive a reminder letter in the mail two months in advance. If you don't receive a letter, please call our office to schedule the follow-up appointment.    COVID-19 Vaccine Information can be found at: [email or web address].uk For questions related to vaccine distribution or appointments, please email [email or web address] or call [phone].      Sumner Boast, PA-C  04/20/2019 11:00 AM    [facility] [place], [place], [place]  [address] Phone: [phone]; Fax: [phone]

## 2019-04-20 ENCOUNTER — Ambulatory Visit (INDEPENDENT_AMBULATORY_CARE_PROVIDER_SITE_OTHER): Payer: Medicare HMO | Admitting: Physician Assistant

## 2019-04-20 ENCOUNTER — Other Ambulatory Visit: Payer: Self-pay

## 2019-04-20 ENCOUNTER — Encounter: Payer: Self-pay | Admitting: Physician Assistant

## 2019-04-20 VITALS — BP 130/72 | HR 81 | Ht 63.0 in | Wt 173.0 lb

## 2019-04-20 DIAGNOSIS — E785 Hyperlipidemia, unspecified: Secondary | ICD-10-CM

## 2019-04-20 DIAGNOSIS — I5032 Chronic diastolic (congestive) heart failure: Secondary | ICD-10-CM | POA: Diagnosis not present

## 2019-04-20 DIAGNOSIS — I480 Paroxysmal atrial fibrillation: Secondary | ICD-10-CM

## 2019-04-20 DIAGNOSIS — I10 Essential (primary) hypertension: Secondary | ICD-10-CM

## 2019-04-20 DIAGNOSIS — I25810 Atherosclerosis of coronary artery bypass graft(s) without angina pectoris: Secondary | ICD-10-CM | POA: Diagnosis not present

## 2019-04-20 DIAGNOSIS — Z8673 Personal history of transient ischemic attack (TIA), and cerebral infarction without residual deficits: Secondary | ICD-10-CM

## 2019-04-20 NOTE — Patient Instructions (Addendum)
Your physician recommends that you continue on your current medications as directed. Please refer to the Current Medication list given to you today.  Your physician wants you to follow-up in: [place] will receive a reminder letter in the mail two months in advance. If you don't receive a letter, please call our office to schedule the follow-up appointment.    COVID-19 Vaccine Information can be found at: [email or web address].uk For questions related to vaccine distribution or appointments, please email [email or web address] or call [phone].

## 2019-04-21 NOTE — Addendum Note (Signed)
Addended by: Maren Beach, Tequia Wolman A on: 04/21/2019 03:50 PM   Modules accepted: Orders

## 2019-04-26 ENCOUNTER — Ambulatory Visit: Payer: Medicare HMO | Admitting: Cardiovascular Disease

## 2019-04-28 NOTE — Addendum Note (Signed)
Addended by: Imogene Burn on: 04/28/2019 07:53 AM   Modules accepted: Orders

## 2019-05-04 ENCOUNTER — Ambulatory Visit (INDEPENDENT_AMBULATORY_CARE_PROVIDER_SITE_OTHER): Payer: Medicare HMO | Admitting: *Deleted

## 2019-05-04 DIAGNOSIS — I639 Cerebral infarction, unspecified: Secondary | ICD-10-CM | POA: Diagnosis not present

## 2019-05-04 LAB — CUP PACEART REMOTE DEVICE CHECK
Date Time Interrogation Session: 20210201233033
Implantable Pulse Generator Implant Date: 20191206

## 2019-05-05 DIAGNOSIS — R300 Dysuria: Secondary | ICD-10-CM | POA: Diagnosis not present

## 2019-05-05 NOTE — Progress Notes (Signed)
ILR Remote 

## 2019-05-23 IMAGING — DX DG CHEST 1V
1 series · 1 of 1 positions shown · non-contrast
Comparison: 03/02/2018

CLINICAL DATA: Shortness of Breath

EXAM:
CHEST  1 VIEW

[chest ap]
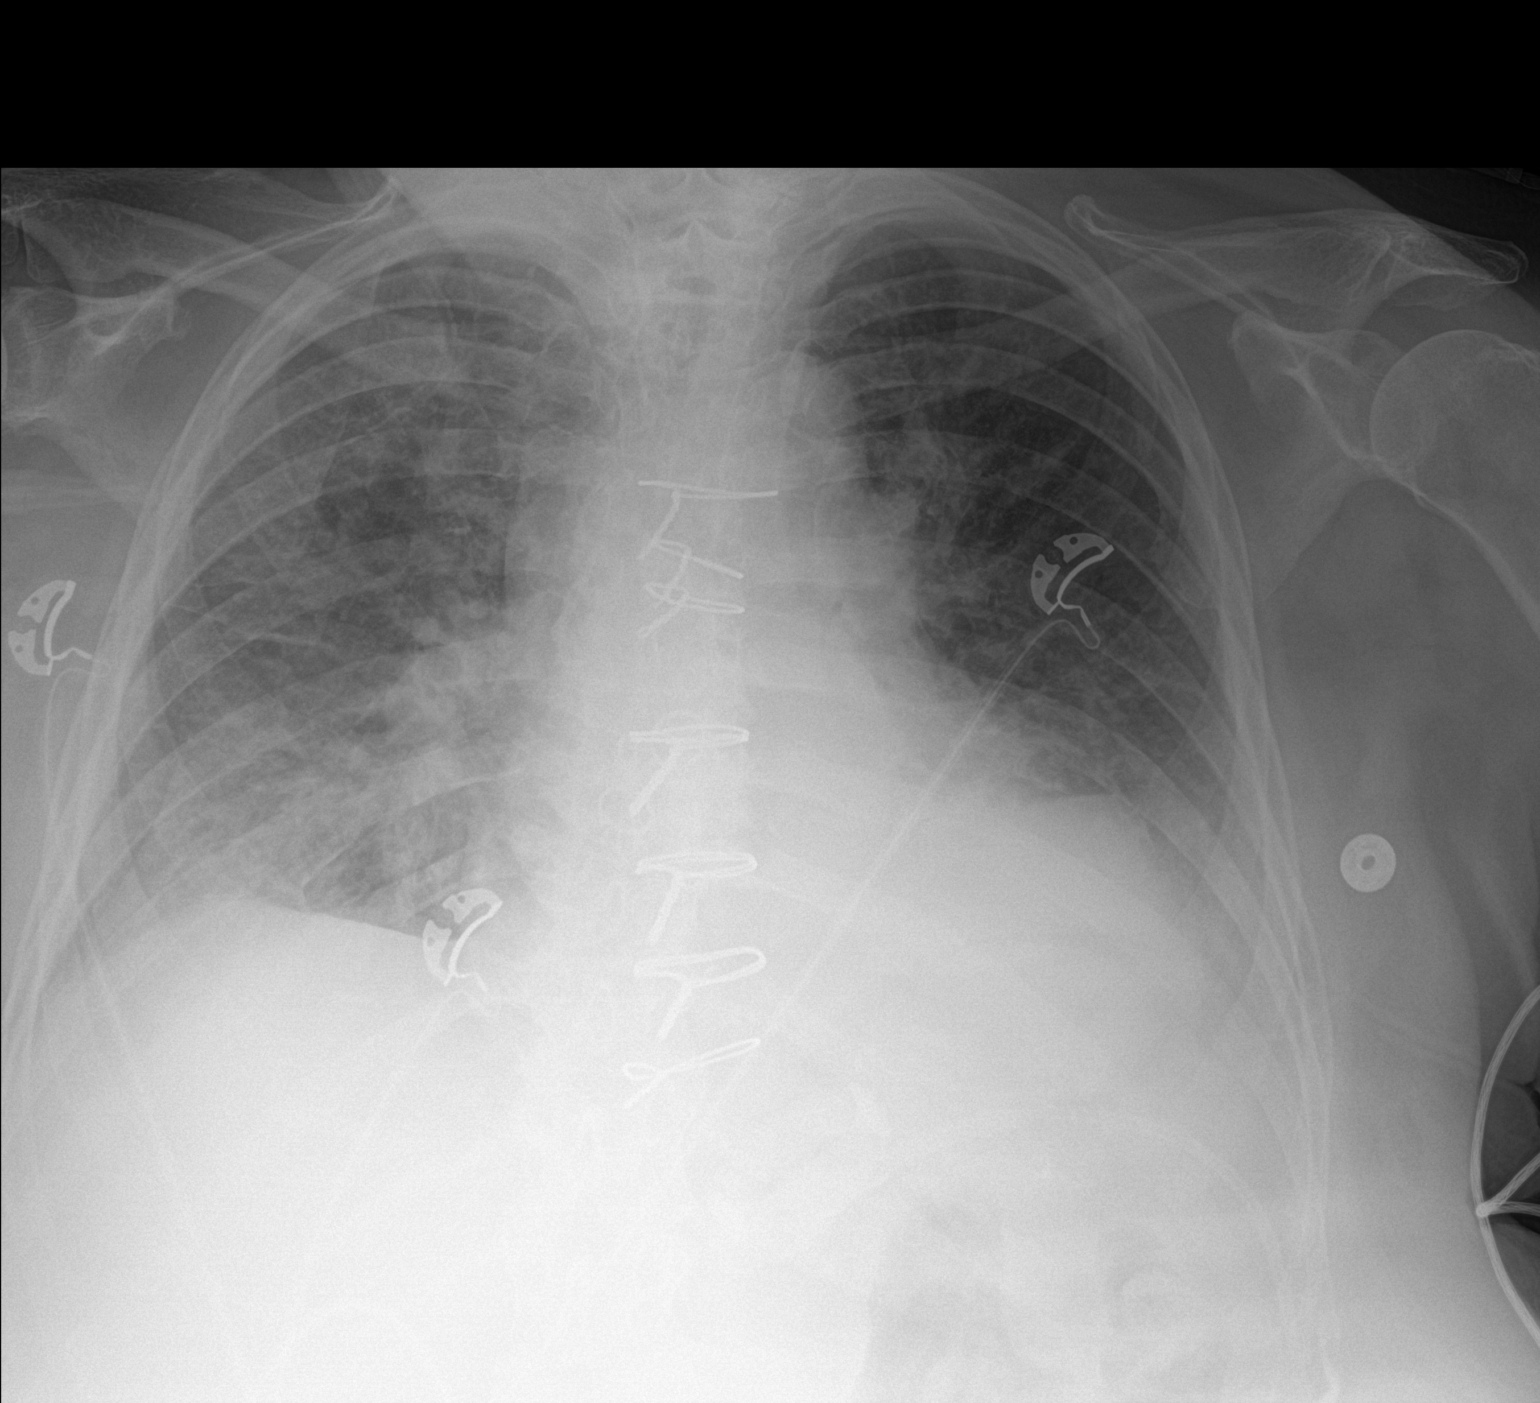

[1 of 1 positions shown; findings below may reference images not displayed]

FINDINGS: Cardiac shadow is stable. Increased vascular congestion is noted
with patchy bibasilar infiltrates and mild interstitial edema. No
sizable effusion is seen. No bony abnormality is noted.
IMPRESSION: Changes of mild CHF with bibasilar infiltrates identified.

## 2019-05-31 IMAGING — DX DG ABDOMEN 1V
1 series · 1 of 1 positions shown · non-contrast
Comparison: CT abdomen and pelvis 03/02/2018

CLINICAL DATA: Abdominal pain, ureteral calculus

EXAM:
ABDOMEN - 1 VIEW

[abdomen kub]
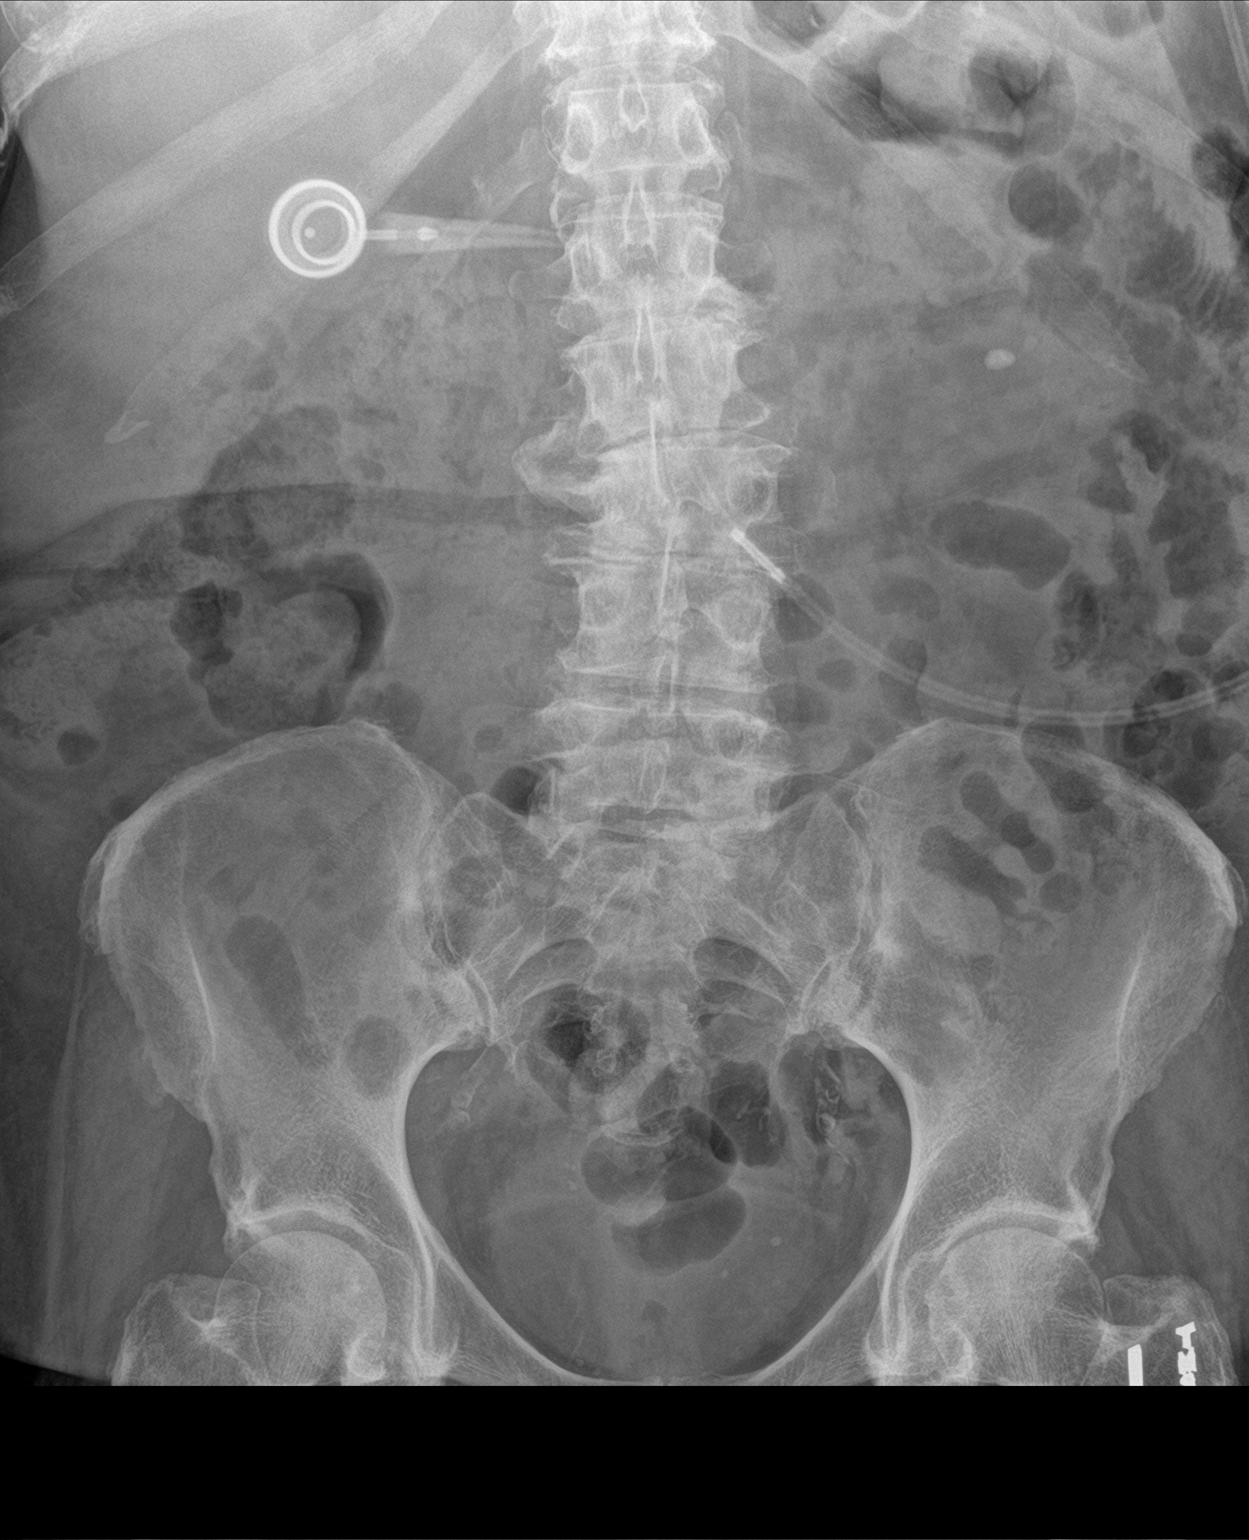

[1 of 1 positions shown; findings below may reference images not displayed]

FINDINGS: Reservoir and tubing from a laparoscopic gastric band project over
upper abdomen.

2 adjacent calculi project over LEFT kidney, 8 x 5 mm and 3 x 3 mm.

Atherosclerotic calcifications and small phleboliths in pelvis.

No definite ureteral calcification visualized.

Bowel gas pattern normal.

Osseous demineralization with degenerative changes of the lumbar
spine.
IMPRESSION: 2 LEFT renal calculi as above.

## 2019-06-02 ENCOUNTER — Ambulatory Visit (INDEPENDENT_AMBULATORY_CARE_PROVIDER_SITE_OTHER): Payer: Medicare HMO | Admitting: *Deleted

## 2019-06-02 DIAGNOSIS — I639 Cerebral infarction, unspecified: Secondary | ICD-10-CM | POA: Diagnosis not present

## 2019-06-02 LAB — CUP PACEART REMOTE DEVICE CHECK
Date Time Interrogation Session: 20210302231653
Implantable Pulse Generator Implant Date: 20191206

## 2019-06-02 NOTE — Progress Notes (Signed)
ILR Remote 

## 2019-07-05 ENCOUNTER — Ambulatory Visit (INDEPENDENT_AMBULATORY_CARE_PROVIDER_SITE_OTHER): Payer: Medicare HMO | Admitting: *Deleted

## 2019-07-05 DIAGNOSIS — I639 Cerebral infarction, unspecified: Secondary | ICD-10-CM | POA: Diagnosis not present

## 2019-07-05 LAB — CUP PACEART REMOTE DEVICE CHECK
Date Time Interrogation Session: 20210403001858
Implantable Pulse Generator Implant Date: 20191206

## 2019-07-06 NOTE — Progress Notes (Signed)
ILR Remote 

## 2019-07-07 ENCOUNTER — Telehealth: Payer: Self-pay | Admitting: *Deleted

## 2019-07-07 NOTE — Telephone Encounter (Signed)
LINQ alert received for "AF" episode on 07/06/19, 39min duration. Implanted for cryptogenic stroke. ECG appears SR w/PACs, PVCs, and baseline artifact, as well as intermittent undersensing due to high amplitude of PVCs. Routed to Dr. Curt Bears for confirmation that ECG does not indicate new AF.

## 2019-07-07 NOTE — Telephone Encounter (Signed)
Agree, SR with PVCs

## 2019-07-08 ENCOUNTER — Other Ambulatory Visit: Payer: Self-pay | Admitting: Nurse Practitioner

## 2019-07-15 DIAGNOSIS — Z6831 Body mass index (BMI) 31.0-31.9, adult: Secondary | ICD-10-CM | POA: Diagnosis not present

## 2019-07-15 DIAGNOSIS — E7800 Pure hypercholesterolemia, unspecified: Secondary | ICD-10-CM | POA: Diagnosis not present

## 2019-07-15 DIAGNOSIS — I10 Essential (primary) hypertension: Secondary | ICD-10-CM | POA: Diagnosis not present

## 2019-07-15 DIAGNOSIS — E039 Hypothyroidism, unspecified: Secondary | ICD-10-CM | POA: Diagnosis not present

## 2019-07-15 DIAGNOSIS — R69 Illness, unspecified: Secondary | ICD-10-CM | POA: Diagnosis not present

## 2019-07-15 DIAGNOSIS — Z79899 Other long term (current) drug therapy: Secondary | ICD-10-CM | POA: Diagnosis not present

## 2019-07-23 DIAGNOSIS — S42202D Unspecified fracture of upper end of left humerus, subsequent encounter for fracture with routine healing: Secondary | ICD-10-CM | POA: Diagnosis not present

## 2019-07-23 DIAGNOSIS — M1711 Unilateral primary osteoarthritis, right knee: Secondary | ICD-10-CM | POA: Diagnosis not present

## 2019-07-23 DIAGNOSIS — M25561 Pain in right knee: Secondary | ICD-10-CM | POA: Diagnosis not present

## 2019-08-05 LAB — CUP PACEART REMOTE DEVICE CHECK
Date Time Interrogation Session: 20210504010701
Implantable Pulse Generator Implant Date: 20191206

## 2019-08-09 ENCOUNTER — Ambulatory Visit (INDEPENDENT_AMBULATORY_CARE_PROVIDER_SITE_OTHER): Payer: Medicare HMO | Admitting: *Deleted

## 2019-08-09 DIAGNOSIS — I639 Cerebral infarction, unspecified: Secondary | ICD-10-CM

## 2019-08-10 NOTE — Progress Notes (Signed)
Carelink Summary Report / Loop Recorder 

## 2019-08-11 ENCOUNTER — Encounter: Payer: Self-pay | Admitting: Physical Medicine & Rehabilitation

## 2019-08-11 ENCOUNTER — Ambulatory Visit
Admission: RE | Admit: 2019-08-11 | Discharge: 2019-08-11 | Disposition: A | Discharge status: Home / Self Care | Payer: Medicare HMO | Source: Ambulatory Visit | Attending: Physical Medicine & Rehabilitation | Admitting: Physical Medicine & Rehabilitation

## 2019-08-11 ENCOUNTER — Other Ambulatory Visit: Payer: Self-pay

## 2019-08-11 ENCOUNTER — Encounter: Payer: Medicare HMO | Attending: Physical Medicine & Rehabilitation | Admitting: Physical Medicine & Rehabilitation

## 2019-08-11 DIAGNOSIS — M47816 Spondylosis without myelopathy or radiculopathy, lumbar region: Secondary | ICD-10-CM

## 2019-08-11 DIAGNOSIS — M545 Low back pain: Secondary | ICD-10-CM | POA: Diagnosis not present

## 2019-08-11 MED ORDER — METHOCARBAMOL 500 MG PO TABS: 250.0000 mg | ORAL_TABLET | Freq: Three times a day (TID) | ORAL | 1 refills | Status: DC | PRN

## 2019-08-11 NOTE — Progress Notes (Signed)
Subjective:    Patient ID: Theresa Ray, female    DOB: 13-Dec-1945, 74 y.o.   MRN: [id number]  HPI   Theresa Ray is here in follow up of her right MCA infarct. I last saw her in July of last year. She reports ongoing low back pain when she is standing up. She can stand only 2 minutes or so before she has to sit down. The back pain has been going on for about a year. The pain doesn't radiate into her legs. The pain only improves when she sits or lays down. Tylenol doesn't help a whole lot.   Dizziness seems to have resolved. She used meclizine for awhile but has been able to stop.   She still sometimes has some delays in processing and dififculties with safety judgement. Husband provides support at home. 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 General activity 5 Relation with others 5 Enjoyment of life 5 What TIME of day is your pain at its worst? daytime Sleep (in general) Fair  Pain is worse with: walking Pain improves with: heat/ice and medication Relief from Meds: 8  Mobility ability to climb steps?  yes do you drive?  no  Function retired I need assistance with the following:  meal prep, household duties and shopping  Neuro/Psych trouble walking confusion depression  Prior Studies Any changes since last visit?  no  Physicians involved in your care Primary care . Orthopedist .   Family History  Problem Relation Age of Onset  . Heart disease Mother 106       CABG  . CVA Father    Social History   Socioeconomic History  . Marital status: Married    Spouse name: Not on file  . Number of children: Not on file  . Years of education: Not on file  . Highest education level: Not on file  Occupational History  . Not on file  Tobacco Use  . Smoking status: Former Smoker    Types: Cigarettes    Start date: 1967    Quit  date: 1975    Years since quitting: 46.3  . Smokeless tobacco: Never Used  Substance and Sexual Activity  . Alcohol use: No  . Drug use: No  . Sexual activity: Not on file  Other Topics Concern  . Not on file  Social History Narrative  . Not on file   Social Determinants of Health   Financial Resource Strain:   . Difficulty of Paying Living Expenses:   Food Insecurity:   . Worried About Charity fundraiser in the Last Year:   . Arboriculturist in the Last Year:   Transportation Needs:   . Film/video editor (Medical):   Marland Kitchen Lack of Transportation (Non-Medical):   Physical Activity:   . Days of Exercise per Week:   . Minutes of Exercise per Session:   Stress:   . Feeling of Stress :   Social Connections:   . Frequency of Communication with Friends and Family:   . Frequency of Social Gatherings with Friends and Family:   . Attends Religious Services:   . Active Member of Clubs or Organizations:   . Attends Archivist Meetings:   Marland Kitchen Marital Status:    Past Surgical History:  Procedure Laterality  Date  . ABDOMINAL HYSTERECTOMY    . CORONARY ARTERY BYPASS GRAFT N/A 02/24/2017   Procedure: CORONARY ARTERY BYPASS GRAFTING (CABG) USING LEFT INTERNAL MAMMARY ARTERY TO LAD AND ENDOSCOPICALLY HARVESTED GREATER SAPHENOUS VEIN TO PDA AND TO OM1.;  Surgeon: Grace Isaac, MD;  Location: [place];  Service: Open Heart Surgery;  Laterality: N/A;  . ENDOVEIN HARVEST OF GREATER SAPHENOUS VEIN Right 02/24/2017   Procedure: ENDOVEIN HARVEST OF GREATER SAPHENOUS VEIN;  Surgeon: Grace Isaac, MD;  Location: [place];  Service: Open Heart Surgery;  Laterality: Right;  . LAPAROSCOPIC GASTRIC BANDING    . LEFT HEART CATH AND CORONARY ANGIOGRAPHY N/A 02/18/2017   Procedure: LEFT HEART CATH AND CORONARY ANGIOGRAPHY;  Surgeon: Leonie Man, MD;  Location: [place] [facility] LAB;  Service: Cardiovascular;  Laterality: N/A;  . LOOP RECORDER INSERTION N/A 03/06/2018   Procedure: LOOP  RECORDER INSERTION;  Surgeon: Constance Haw, MD;  Location: [place] [facility];  Service: Cardiovascular;  Laterality: N/A;  . TEE WITHOUT CARDIOVERSION N/A 02/24/2017   Procedure: TRANSESOPHAGEAL ECHOCARDIOGRAM (TEE);  Surgeon: Grace Isaac, MD;  Location: [place];  Service: Open Heart Surgery;  Laterality: N/A;  . TEE WITHOUT CARDIOVERSION N/A 03/06/2018   Procedure: TRANSESOPHAGEAL ECHOCARDIOGRAM (TEE);  Surgeon: Dorothy Spark, MD;  Location: [facility] ENDOSCOPY;  Service: Cardiovascular;  Laterality: N/A;  loop   Past Medical History:  Diagnosis Date  . CAD in native artery    a. s/p CABGx3 in 01/2017.  Marland Kitchen Chronic diastolic CHF (congestive heart failure) ([place])   . Diabetes mellitus ([place])   . Diabetic neuropathy ([place])   . History of kidney stones    history of stones  . Hypertension   . Hypothyroidism   . NSTEMI (non-ST elevated myocardial infarction) ([place])   . Orthostatic hypotension   . Postoperative atrial fibrillation ([place])    a. after CABG 01/2017.  Marland Kitchen TIA (transient ischemic attack)    LMP  (LMP Unknown)   Opioid Risk Score:   Fall Risk Score:  `1  Depression screen PHQ 2/9  Depression screen [facility] 2/9 05/06/2018 04/07/2018  Decreased Interest 3 0  Down, Depressed, Hopeless 3 0  PHQ - 2 Score 6 0  Altered sleeping 2 1  Tired, decreased energy 3 1  Change in appetite 3 0  Feeling bad or failure about yourself  2 0  Trouble concentrating 2 1  Moving slowly or fidgety/restless 0 1  Suicidal thoughts 2 0  PHQ-9 Score 20 4  Difficult doing work/chores Not difficult at all Not difficult at all     Review of Systems  Musculoskeletal: Positive for gait problem.  Psychiatric/Behavioral: Positive for confusion.  All other systems reviewed and are negative.      Objective:   Physical Exam .General: No acute distress HEENT: EOMI, oral membranes moist Cards: reg rate  Chest: normal effort Abdomen: Soft, NT, ND Skin: dry, intact Extremities: no edema  Neuro:  improving insight. But a little impulsive and sometimes has delays with planned responses/activities. reflexes are 2+ in all 4's. Fine motor coordination is intact. No tremors. Motor function is grossly 5 out of 5 right upper extremity. Still loses balance posteriorly Musculoskeletal: low back ttp, limited flexion to 45 degrees. Extension to 10 degrees. Facet testing +. SLR negative.   Psych: Pt's affect is appropriate. Pt is cooperative             Assessment & Plan:  1. 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  Hypertension: PER Primary            -avoid excessive hypotension 3.  Type 2 diabetes: Per primary care physician. 4.  History of hydronephrosis with urinary obstruction due to renal calculus: Patient status post double-J stents.  Urology has followed patient. 5. Right rotator cuff injury/tear, new left humerus fx, may have RTC injury there as well             -per ortho 6. 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 All questions were encouraged and answered.  Follow up with me in 6 weeks .

## 2019-08-11 NOTE — Patient Instructions (Signed)
PLEASE FEEL FREE TO CALL OUR OFFICE WITH ANY PROBLEMS OR QUESTIONS ([phone])      

## 2019-08-13 ENCOUNTER — Telehealth (HOSPITAL_COMMUNITY): Payer: Self-pay | Admitting: Physical Medicine & Rehabilitation

## 2019-08-13 NOTE — Telephone Encounter (Signed)
I called patient regarding xrays. There is a likely old T12 compression fx but new since 3/20. Her pain however, is much lower than this. We will keep an eye on this area, but she can proceed with PT.

## 2019-08-17 ENCOUNTER — Telehealth: Payer: Self-pay | Admitting: *Deleted

## 2019-08-17 DIAGNOSIS — M47816 Spondylosis without myelopathy or radiculopathy, lumbar region: Secondary | ICD-10-CM

## 2019-08-17 NOTE — Telephone Encounter (Signed)
Stephanie from [facility] called and states that they only provide PT and [facility] referral says OT. She says if it is for PT then we need to send another referral.

## 2019-08-17 NOTE — Telephone Encounter (Signed)
New referral sent.

## 2019-08-17 NOTE — Telephone Encounter (Signed)
Please refer her to [facility] hospital outpatient OT. Same orders. Thanks.

## 2019-08-18 ENCOUNTER — Telehealth: Payer: Self-pay

## 2019-08-18 NOTE — Telephone Encounter (Signed)
Spoke with patient to inform of disconnected monitor. °

## 2019-08-20 DIAGNOSIS — R262 Difficulty in walking, not elsewhere classified: Secondary | ICD-10-CM | POA: Diagnosis not present

## 2019-08-20 DIAGNOSIS — M6281 Muscle weakness (generalized): Secondary | ICD-10-CM | POA: Diagnosis not present

## 2019-08-20 DIAGNOSIS — M545 Low back pain: Secondary | ICD-10-CM | POA: Diagnosis not present

## 2019-08-28 DIAGNOSIS — S0093XA Contusion of unspecified part of head, initial encounter: Secondary | ICD-10-CM | POA: Diagnosis not present

## 2019-08-28 DIAGNOSIS — S6992XA Unspecified injury of left wrist, hand and finger(s), initial encounter: Secondary | ICD-10-CM | POA: Diagnosis not present

## 2019-09-02 DIAGNOSIS — M6281 Muscle weakness (generalized): Secondary | ICD-10-CM | POA: Diagnosis not present

## 2019-09-02 DIAGNOSIS — R262 Difficulty in walking, not elsewhere classified: Secondary | ICD-10-CM | POA: Diagnosis not present

## 2019-09-02 DIAGNOSIS — M545 Low back pain: Secondary | ICD-10-CM | POA: Diagnosis not present

## 2019-09-03 DIAGNOSIS — M25532 Pain in left wrist: Secondary | ICD-10-CM | POA: Diagnosis not present

## 2019-09-03 DIAGNOSIS — M25561 Pain in right knee: Secondary | ICD-10-CM | POA: Diagnosis not present

## 2019-09-08 DIAGNOSIS — M6281 Muscle weakness (generalized): Secondary | ICD-10-CM | POA: Diagnosis not present

## 2019-09-08 DIAGNOSIS — M545 Low back pain: Secondary | ICD-10-CM | POA: Diagnosis not present

## 2019-09-08 DIAGNOSIS — R262 Difficulty in walking, not elsewhere classified: Secondary | ICD-10-CM | POA: Diagnosis not present

## 2019-09-13 ENCOUNTER — Ambulatory Visit (INDEPENDENT_AMBULATORY_CARE_PROVIDER_SITE_OTHER): Payer: Medicare HMO | Admitting: *Deleted

## 2019-09-13 DIAGNOSIS — M6281 Muscle weakness (generalized): Secondary | ICD-10-CM | POA: Diagnosis not present

## 2019-09-13 DIAGNOSIS — R262 Difficulty in walking, not elsewhere classified: Secondary | ICD-10-CM | POA: Diagnosis not present

## 2019-09-13 DIAGNOSIS — I639 Cerebral infarction, unspecified: Secondary | ICD-10-CM

## 2019-09-13 DIAGNOSIS — M545 Low back pain: Secondary | ICD-10-CM | POA: Diagnosis not present

## 2019-09-14 LAB — CUP PACEART REMOTE DEVICE CHECK
Date Time Interrogation Session: 20210614000519
Implantable Pulse Generator Implant Date: 20191206

## 2019-09-14 NOTE — Progress Notes (Signed)
Carelink Summary Report / Loop Recorder 

## 2019-09-16 DIAGNOSIS — M545 Low back pain: Secondary | ICD-10-CM | POA: Diagnosis not present

## 2019-09-16 DIAGNOSIS — M6281 Muscle weakness (generalized): Secondary | ICD-10-CM | POA: Diagnosis not present

## 2019-09-16 DIAGNOSIS — R262 Difficulty in walking, not elsewhere classified: Secondary | ICD-10-CM | POA: Diagnosis not present

## 2019-09-20 ENCOUNTER — Other Ambulatory Visit: Payer: Self-pay | Admitting: Physical Medicine & Rehabilitation

## 2019-09-20 DIAGNOSIS — M6281 Muscle weakness (generalized): Secondary | ICD-10-CM | POA: Diagnosis not present

## 2019-09-20 DIAGNOSIS — M47816 Spondylosis without myelopathy or radiculopathy, lumbar region: Secondary | ICD-10-CM

## 2019-09-20 DIAGNOSIS — R262 Difficulty in walking, not elsewhere classified: Secondary | ICD-10-CM | POA: Diagnosis not present

## 2019-09-20 DIAGNOSIS — M545 Low back pain: Secondary | ICD-10-CM | POA: Diagnosis not present

## 2019-09-20 NOTE — Telephone Encounter (Signed)
Please review or prescribe. Dr. Naaman Plummer is out of the office. Thanks you

## 2019-09-23 DIAGNOSIS — R262 Difficulty in walking, not elsewhere classified: Secondary | ICD-10-CM | POA: Diagnosis not present

## 2019-09-23 DIAGNOSIS — M545 Low back pain: Secondary | ICD-10-CM | POA: Diagnosis not present

## 2019-09-23 DIAGNOSIS — M6281 Muscle weakness (generalized): Secondary | ICD-10-CM | POA: Diagnosis not present

## 2019-09-29 ENCOUNTER — Other Ambulatory Visit: Payer: Self-pay

## 2019-09-29 ENCOUNTER — Encounter: Payer: Medicare HMO | Attending: Physical Medicine & Rehabilitation | Admitting: Physical Medicine & Rehabilitation

## 2019-09-29 ENCOUNTER — Encounter: Payer: Self-pay | Admitting: Physical Medicine & Rehabilitation

## 2019-09-29 VITALS — BP 143/88 | HR 70 | Temp 98.6°F | Ht 63.0 in | Wt 185.0 lb

## 2019-09-29 DIAGNOSIS — I63411 Cerebral infarction due to embolism of right middle cerebral artery: Secondary | ICD-10-CM

## 2019-09-29 DIAGNOSIS — M47816 Spondylosis without myelopathy or radiculopathy, lumbar region: Secondary | ICD-10-CM | POA: Diagnosis not present

## 2019-09-29 NOTE — Patient Instructions (Signed)
A STRETCH FOR YOU BEFORE AND DURING YOUR SINK DUTIES:  SIT IN A CHAIR THAT'S SECURE, AND LEAN FORWARD, KEEPING YOUR KNEES STRAIGHT AND THEN TRY TO TOUCH YOUR TOES.  GIVE IT A GOOD LEAN AND LET YOUR BODY STRETCH IT OUT. NO BOUNCING!

## 2019-09-29 NOTE — Progress Notes (Signed)
Subjective:    Patient ID: Theresa Ray, female    DOB: [birth year], 74 y.o.   MRN: [id number]  HPI   Theresa Ray is here in follow up of her right MCA infarct. She fell about 3 weeks ago outside and suffered a left wrist injury without apparent fx when she lost balance on driveway.  She has been followed by orthopedic surgery who determined that she did not suffer a fracture.  Her left shoulder seems to be gradually coming along.  As far as her back is concerned, she has noticed improvement with therapy although she still has pain if she standing up for longer periods of time such as at the sink doing dishes.  They are doing some exercises with her as well as other modalities.  From a cognitive standpoint she is showing some progress with improving insight and awareness and processing.  Husband remains very supportive and provide supervision for her.  Theresa Ray is anxious to return to driving.   Pain Inventory Average Pain 8 Pain Right Now 8 My pain is burning and aching  In the last 24 hours, has pain interfered with the following? General activity 8 Relation with others not a lot of interaction Enjoyment of life 7 What TIME of day is your pain at its worst? varies with activity Sleep (in general) Good  Pain is worse with: walking, standing and some activites Pain improves with: heat/ice, therapy/exercise and medication Relief from Meds: n/a  Mobility walk without assistance ability to climb steps?  no do you drive?  no  Function retired  Neuro/Psych trouble walking  Prior Studies Any changes since last visit?  no  Physicians involved in your care Any changes since last visit?  no   Family History  Problem Relation Age of Onset  . Heart disease Mother 98       CABG  . CVA Father    Social History   Socioeconomic History  . Marital status: Married    Spouse name: Not on file  . Number of children: Not on file  . Years of education: Not on file  . Highest  education level: Not on file  Occupational History  . Not on file  Tobacco Use  . Smoking status: Former Smoker    Types: Cigarettes    Start date: 1967    Quit date: 1975    Years since quitting: 46.5  . Smokeless tobacco: Never Used  Vaping Use  . Vaping Use: Never used  Substance and Sexual Activity  . Alcohol use: No  . Drug use: No  . Sexual activity: Not on file  Other Topics Concern  . Not on file  Social History Narrative  . Not on file   Social Determinants of Health   Financial Resource Strain:   . Difficulty of Paying Living Expenses:   Food Insecurity:   . Worried About Charity fundraiser in the Last Year:   . Arboriculturist in the Last Year:   Transportation Needs:   . Film/video editor (Medical):   Marland Kitchen Lack of Transportation (Non-Medical):   Physical Activity:   . Days of Exercise per Week:   . Minutes of Exercise per Session:   Stress:   . Feeling of Stress :   Social Connections:   . Frequency of Communication with Friends and Family:   . Frequency of Social Gatherings with Friends and Family:   . Attends Religious Services:   . Active Member of  Clubs or Organizations:   . Attends Archivist Meetings:   Marland Kitchen Marital Status:    Past Surgical History:  Procedure Laterality Date  . ABDOMINAL HYSTERECTOMY    . CORONARY ARTERY BYPASS GRAFT N/A 02/24/2017   Procedure: CORONARY ARTERY BYPASS GRAFTING (CABG) USING LEFT INTERNAL MAMMARY ARTERY TO LAD AND ENDOSCOPICALLY HARVESTED GREATER SAPHENOUS VEIN TO PDA AND TO OM1.;  Surgeon: Grace Isaac, MD;  Location: [place];  Service: Open Heart Surgery;  Laterality: N/A;  . ENDOVEIN HARVEST OF GREATER SAPHENOUS VEIN Right 02/24/2017   Procedure: ENDOVEIN HARVEST OF GREATER SAPHENOUS VEIN;  Surgeon: Grace Isaac, MD;  Location: [place];  Service: Open Heart Surgery;  Laterality: Right;  . LAPAROSCOPIC GASTRIC BANDING    . LEFT HEART CATH AND CORONARY ANGIOGRAPHY N/A 02/18/2017   Procedure: LEFT  HEART CATH AND CORONARY ANGIOGRAPHY;  Surgeon: Leonie Man, MD;  Location: [facility] [facility];  Service: Cardiovascular;  Laterality: N/A;  . LOOP RECORDER INSERTION N/A 03/06/2018   Procedure: LOOP RECORDER INSERTION;  Surgeon: Constance Haw, MD;  Location: [facility];  Service: Cardiovascular;  Laterality: N/A;  . TEE WITHOUT CARDIOVERSION N/A 02/24/2017   Procedure: TRANSESOPHAGEAL ECHOCARDIOGRAM (TEE);  Surgeon: Grace Isaac, MD;  Location: [facility];  Service: Open Heart Surgery;  Laterality: N/A;  . TEE WITHOUT CARDIOVERSION N/A 03/06/2018   Procedure: TRANSESOPHAGEAL ECHOCARDIOGRAM (TEE);  Surgeon: Dorothy Spark, MD;  Location: [facility];  Service: Cardiovascular;  Laterality: N/A;  loop   Past Medical History:  Diagnosis Date  . CAD in native artery    a. s/p CABGx3 in 01/2017.  Marland Kitchen Chronic diastolic CHF (congestive heart failure) ([place])   . Diabetes mellitus ([place])   . Diabetic neuropathy ([place])   . History of kidney stones    history of stones  . Hypertension   . Hypothyroidism   . NSTEMI (non-ST elevated myocardial infarction) ([place])   . Orthostatic hypotension   . Postoperative atrial fibrillation ([place])    a. after CABG 01/2017.  Marland Kitchen TIA (transient ischemic attack)    BP (!) 143/88   Pulse 70   Temp 98.6 F (37 C) (Oral)   Ht 5\' 3"  (1.6 m)   Wt 185 lb (83.9 kg)   LMP  (LMP Unknown)   SpO2 98%   BMI 32.77 kg/m   Opioid Risk Score:   Fall Risk Score:  `1  Depression screen PHQ 2/9  Depression screen [facility] 2/9 05/06/2018 04/07/2018  Decreased Interest 3 0  Down, Depressed, Hopeless 3 0  PHQ - 2 Score 6 0  Altered sleeping 2 1  Tired, decreased energy 3 1  Change in appetite 3 0  Feeling bad or failure about yourself  2 0  Trouble concentrating 2 1  Moving slowly or fidgety/restless 0 1  Suicidal thoughts 2 0  PHQ-9 Score 20 4  Difficult doing work/chores Not difficult at all Not difficult at all    Review of Systems  Constitutional:  Negative.   HENT: Negative.   Eyes: Negative.   Respiratory: Negative.   Cardiovascular: Negative.   Gastrointestinal: Negative.   Endocrine: Negative.   Genitourinary: Negative.   Musculoskeletal: Positive for arthralgias, back pain and gait problem.  Skin: Negative.   Allergic/Immunologic: Negative.   Hematological: Negative.   Psychiatric/Behavioral: Negative.   All other systems reviewed and are negative.      Objective:   Physical Exam  General: No acute distress HEENT: EOMI, oral membranes moist Cards:  reg rate  Chest: normal effort Abdomen: Soft, NT, ND Skin: dry, intact Extremities: no edema   Neuro: improved language and communication. Apraxic at times. Motor function is grossly 5 out of 5 right upper extremity. Still loses balance posteriorly Musculoskeletal: low back ttp along beltline, limited flexion to 45 degrees.   Facet testing remains +.  Left shoulder still limited.   Psych: Pt's affect is appropriate. Pt is cooperative             Assessment & Plan:  1. 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  Hypertension: PER Primary            -avoid excessive hypotension 3.  Type 2 diabetes: Per primary care physician. 4.  History of hydronephrosis with urinary obstruction due to renal calculus: Patient status post double-J stents.  Urology sees patient. 5. Right rotator cuff injury/tear, new left humerus fx, may have RTC injury there as well             -per ortho 6. Low back pain           -most c/w lumbar with facet arthropathy.  Recent imaging was most notable for a T12 compression deformity which is likely unrelated to her pain complaints.           -prn robaxin low dose 250mg            -tylenol           -heat           -Continue outpt PT who is addressing low back ROM, strengthening, modalities and HEP             15 minutes of face to face patient care time were spent  during this visit. All questions were encouraged and answered.  Follow up with me in 4 months .

## 2019-10-01 DIAGNOSIS — M545 Low back pain: Secondary | ICD-10-CM | POA: Diagnosis not present

## 2019-10-01 DIAGNOSIS — R262 Difficulty in walking, not elsewhere classified: Secondary | ICD-10-CM | POA: Diagnosis not present

## 2019-10-01 DIAGNOSIS — M6281 Muscle weakness (generalized): Secondary | ICD-10-CM | POA: Diagnosis not present

## 2019-10-13 DIAGNOSIS — M545 Low back pain: Secondary | ICD-10-CM | POA: Diagnosis not present

## 2019-10-13 DIAGNOSIS — M6281 Muscle weakness (generalized): Secondary | ICD-10-CM | POA: Diagnosis not present

## 2019-10-13 DIAGNOSIS — R262 Difficulty in walking, not elsewhere classified: Secondary | ICD-10-CM | POA: Diagnosis not present

## 2019-10-18 ENCOUNTER — Ambulatory Visit (INDEPENDENT_AMBULATORY_CARE_PROVIDER_SITE_OTHER): Payer: Medicare HMO | Admitting: *Deleted

## 2019-10-18 DIAGNOSIS — I639 Cerebral infarction, unspecified: Secondary | ICD-10-CM | POA: Diagnosis not present

## 2019-10-18 LAB — CUP PACEART REMOTE DEVICE CHECK
Date Time Interrogation Session: 20210718232920
Implantable Pulse Generator Implant Date: 20191206

## 2019-10-19 DIAGNOSIS — M6281 Muscle weakness (generalized): Secondary | ICD-10-CM | POA: Diagnosis not present

## 2019-10-19 DIAGNOSIS — M545 Low back pain: Secondary | ICD-10-CM | POA: Diagnosis not present

## 2019-10-19 DIAGNOSIS — R262 Difficulty in walking, not elsewhere classified: Secondary | ICD-10-CM | POA: Diagnosis not present

## 2019-10-19 NOTE — Progress Notes (Signed)
Carelink Summary Report / Loop Recorder 

## 2019-10-20 ENCOUNTER — Other Ambulatory Visit: Payer: Self-pay | Admitting: Registered Nurse

## 2019-10-20 DIAGNOSIS — M47816 Spondylosis without myelopathy or radiculopathy, lumbar region: Secondary | ICD-10-CM

## 2019-10-25 ENCOUNTER — Encounter: Payer: Self-pay | Admitting: Cardiovascular Disease

## 2019-10-25 ENCOUNTER — Other Ambulatory Visit: Payer: Self-pay

## 2019-10-25 ENCOUNTER — Ambulatory Visit: Payer: Medicare HMO | Admitting: Cardiovascular Disease

## 2019-10-25 VITALS — BP 126/66 | HR 76 | Ht 63.0 in | Wt 185.0 lb

## 2019-10-25 DIAGNOSIS — I480 Paroxysmal atrial fibrillation: Secondary | ICD-10-CM | POA: Diagnosis not present

## 2019-10-25 DIAGNOSIS — M25561 Pain in right knee: Secondary | ICD-10-CM | POA: Diagnosis not present

## 2019-10-25 DIAGNOSIS — I5032 Chronic diastolic (congestive) heart failure: Secondary | ICD-10-CM

## 2019-10-25 DIAGNOSIS — I25810 Atherosclerosis of coronary artery bypass graft(s) without angina pectoris: Secondary | ICD-10-CM | POA: Diagnosis not present

## 2019-10-25 DIAGNOSIS — M25512 Pain in left shoulder: Secondary | ICD-10-CM | POA: Diagnosis not present

## 2019-10-25 NOTE — Progress Notes (Signed)
Chief Complaint  Patient presents with  . Follow-up    CAD   History of Present Illness: 74 yo female with history of chronic diastolic CHF, DM, HTN and CAD s/p CABG who is here today for cardiac follow up. She was admitted to [facility] in November 2018 with a NSTEMI and found to have severe three vessel CAD. She underwent 3 V CABG on 02/24/17 (LIMA to LAD, SVG to PDA, SVG to OM1). She had post op atrial fib and converted to sinus on amiodarone prior to discharge. Several episodes of syncope post discharge and she was felt to be hypovolemic. Her Norvasc and Lasix was stopped and Coreg dosage was reduced. Cardiac event monitor was normal. Symptoms resolved. Echo 02/17/17 with LVEF=55-60%, mild MR. Cardiac monitor November 2019 with no evidence of atrial fibrillation. She had a stroke in December 2019.  TEE 03/06/18 with no intracardiac thrombus and no PFO or ASD. Loop recorder was placed. No atrial fib noted on monitor.   She is here today for follow up. The patient denies any chest pain, dyspnea, palpitations, lower extremity edema, orthopnea, PND, dizziness, near syncope or syncope. She is doing well overall.   Primary Care Physician: Ronita Hipps, MD  Past Medical History:  Diagnosis Date  . CAD in native artery    a. s/p CABGx3 in 01/2017.  Marland Kitchen Chronic diastolic CHF (congestive heart failure) ([place])   . Diabetes mellitus ([place])   . Diabetic neuropathy ([place])   . History of kidney stones    history of stones  . Hypertension   . Hypothyroidism   . NSTEMI (non-ST elevated myocardial infarction) ([place])   . Orthostatic hypotension   . Postoperative atrial fibrillation ([place])    a. after CABG 01/2017.  Marland Kitchen TIA (transient ischemic attack)     Past Surgical History:  Procedure Laterality Date  . ABDOMINAL HYSTERECTOMY    . CORONARY ARTERY BYPASS GRAFT N/A 02/24/2017   Procedure: CORONARY ARTERY BYPASS GRAFTING (CABG) USING LEFT INTERNAL MAMMARY ARTERY TO LAD AND ENDOSCOPICALLY HARVESTED GREATER  SAPHENOUS VEIN TO PDA AND TO OM1.;  Surgeon: Grace Isaac, MD;  Location: [place];  Service: Open Heart Surgery;  Laterality: N/A;  . ENDOVEIN HARVEST OF GREATER SAPHENOUS VEIN Right 02/24/2017   Procedure: ENDOVEIN HARVEST OF GREATER SAPHENOUS VEIN;  Surgeon: Grace Isaac, MD;  Location: [place];  Service: Open Heart Surgery;  Laterality: Right;  . LAPAROSCOPIC GASTRIC BANDING    . LEFT HEART CATH AND CORONARY ANGIOGRAPHY N/A 02/18/2017   Procedure: LEFT HEART CATH AND CORONARY ANGIOGRAPHY;  Surgeon: Leonie Man, MD;  Location: [place] [facility];  Service: Cardiovascular;  Laterality: N/A;  . LOOP RECORDER INSERTION N/A 03/06/2018   Procedure: LOOP RECORDER INSERTION;  Surgeon: Constance Haw, MD;  Location: [place] [facility];  Service: Cardiovascular;  Laterality: N/A;  . TEE WITHOUT CARDIOVERSION N/A 02/24/2017   Procedure: TRANSESOPHAGEAL ECHOCARDIOGRAM (TEE);  Surgeon: Grace Isaac, MD;  Location: [place];  Service: Open Heart Surgery;  Laterality: N/A;  . TEE WITHOUT CARDIOVERSION N/A 03/06/2018   Procedure: TRANSESOPHAGEAL ECHOCARDIOGRAM (TEE);  Surgeon: Dorothy Spark, MD;  [facility]: [facility] END[facility];  Service: Cardiovascular;  Laterality: N/A;  loop    Current Outpatient Medications  Medication Sig Dispense Refill  . acetaminophen (TYLENOL) 325 MG tablet Take 2 tablets (650 mg total) by mouth every 6 (six) hours as needed for mild pain.    Marland Kitchen amLODipine (NORVASC) 10 MG tablet TAKE 1 TABLET BY MOUTH EVERY  DAY 90 tablet 2  . Carboxymethylcellul-Glycerin (CLEAR EYES FOR DRY EYES) 1-0.25 % SOLN Place 1 drop into both eyes as needed.    . carvedilol (COREG) 12.5 MG tablet Take 1 tablet (12.5 mg total) by mouth 2 (two) times daily. 60 tablet 11  . cholecalciferol (VITAMIN D) 1000 units tablet Take 1,000 Units by mouth daily.     . citalopram (CELEXA) 10 MG tablet Take 1 tablet (10 mg total) by mouth daily. 30 tablet 0  . clopidogrel (PLAVIX) 75 MG tablet Take 1 tablet  (75 mg total) by mouth daily. 90 tablet 3  . glipiZIDE (GLUCOTROL) 5 MG tablet Take 1 tablet (5 mg total) by mouth 2 (two) times daily before a meal. 60 tablet 1  . Glucosamine-Chondroit-Vit C-Mn (GLUCOSAMINE CHONDR 500 COMPLEX PO) Take 1 tablet 2 (two) times daily by mouth.    Javier Docker Oil 1000 MG CAPS Take 1,000 mg daily by mouth.    . levothyroxine (SYNTHROID, LEVOTHROID) 175 MCG tablet Take 1 tablet (175 mcg total) by mouth daily before breakfast. 30 tablet 1  . lisinopril (PRINIVIL,ZESTRIL) 5 MG tablet Take 1 tablet (5 mg total) by mouth daily. 30 tablet 3  . magnesium oxide (MAG-OX) 400 (241.3 Mg) MG tablet Take 1 tablet (400 mg total) by mouth 2 (two) times daily.    . meclizine (ANTIVERT) 25 MG tablet Take 1 tablet (25 mg total) by mouth 3 (three) times daily as needed for dizziness. 30 tablet 1  . metFORMIN (GLUCOPHAGE) 500 MG tablet Take 1 tablet (500 mg total) by mouth 2 (two) times daily with a meal. 60 tablet 1  . methocarbamol (ROBAXIN) 500 MG tablet TAKE 0.5 TABLETS (250 MG TOTAL) BY MOUTH EVERY 8 (EIGHT) HOURS AS NEEDED FOR MUSCLE SPASMS. 30 tablet 1  . Multiple Vitamin (MULTIVITAMIN WITH MINERALS) TABS tablet Take 1 tablet daily by mouth.    . pantoprazole (PROTONIX) 40 MG tablet Take 1 tablet (40 mg total) by mouth daily. 30 tablet 0  . atorvastatin (LIPITOR) 40 MG tablet Take 1 tablet (40 mg total) by mouth daily. 90 tablet 3   No current facility-administered medications for this visit.    Allergies  Allergen Reactions  . Sulfa Antibiotics Nausea And Vomiting  . Shellfish Allergy Other (See Comments)    UNKNOWN REACTION.  Allergy found on ALLERGY TEST.  . Aggrenox [Aspirin-Dipyridamole Er] Other (See Comments)    Redness in eyes, takes aspirin 325 at home at baseline    Social History   Socioeconomic History  . Marital status: Married    Spouse name: Not on file  . Number of children: Not on file  . Years of education: Not on file  . Highest education level: Not  on file  Occupational History  . Not on file  Tobacco Use  . Smoking status: Former Smoker    Types: Cigarettes    Start date: 1967    Quit date: 1975    Years since quitting: 46.5  . Smokeless tobacco: Never Used  Vaping Use  . Vaping Use: Never used  Substance and Sexual Activity  . Alcohol use: No  . Drug use: No  . Sexual activity: Not on file  Other Topics Concern  . Not on file  Social History Narrative  . Not on file   Social Determinants of Health   Financial Resource Strain:   . Difficulty of Paying Living Expenses:   Food Insecurity:   . Worried About Charity fundraiser in the  Last Year:   . [place] in the Last Year:   Transportation Needs:   . Film/video editor (Medical):   Marland Kitchen Lack of Transportation (Non-Medical):   Physical Activity:   . Days of Exercise per Week:   . Minutes of Exercise per Session:   Stress:   . Feeling of Stress :   Social Connections:   . Frequency of Communication with Friends and Family:   . Frequency of Social Gatherings with Friends and Family:   . Attends Religious Services:   . Active Member of Clubs or Organizations:   . Attends Archivist Meetings:   Marland Kitchen Marital Status:   Intimate Partner Violence:   . Fear of Current or Ex-Partner:   . Emotionally Abused:   Marland Kitchen Physically Abused:   . Sexually Abused:     Family History  Problem Relation Age of Onset  . Heart disease Mother 13       CABG  . CVA Father     Review of Systems:  As stated in the HPI and otherwise negative.   BP 126/66   Pulse 76   Ht 5\' 3"  (1.6 m)   Wt 185 lb (83.9 kg)   LMP  (LMP Unknown)   SpO2 100%   BMI 32.77 kg/m   Physical Examination:  General: Well developed, well nourished, NAD  HEENT: OP clear, mucus membranes moist  SKIN: warm, dry. No rashes. Neuro: No focal deficits  Musculoskeletal: Muscle strength 5/5 all ext  Psychiatric: Mood and affect normal  Neck: No JVD, no carotid bruits, no thyromegaly, no  lymphadenopathy.  Lungs:Clear bilaterally, no wheezes, rhonci, crackles Cardiovascular: Regular rate and rhythm. No murmurs, gallops or rubs. Abdomen:Soft. Bowel sounds present. Non-tender.  Extremities: No lower extremity edema. Pulses are 2 + in the bilateral DP/PT.  EKG:  EKG is not  ordered today. The ekg ordered today demonstrates   Recent Labs: No results found for requested labs within last 8760 hours.   Lipid Panel    Component Value Date/Time   CHOL 167 03/03/2018 1046   TRIG 157 (H) 03/03/2018 1046   HDL 37 (L) 03/03/2018 1046   CHOLHDL 4.5 03/03/2018 1046   VLDL 31 03/03/2018 1046   LDLCALC 99 03/03/2018 1046     Wt Readings from Last 3 Encounters:  10/25/19 185 lb (83.9 kg)  09/29/19 185 lb (83.9 kg)  08/11/19 183 lb 3.2 oz (83.1 kg)     Other studies Reviewed: Additional studies/ records that were reviewed today include: . Review of the above records demonstrates:    Assessment and Plan:   1. CAD s/p CABG without angina: She had CABG in November 2018.  No chest pain. Will continue ASA, Plavix, statin and beta blocker.   Lipids followed in primary care and well controlled. LDL 54 in April 2021.   2. Post op atrial fibrillation: No events reported check on monitor  3. Chronic diastolic CHF: Weight is stable. No volume overload on exam.   Current medicines are reviewed at length with the patient today.  The patient does not have concerns regarding medicines.  The following changes have been made:  no change  Labs/ tests ordered today include:   No orders of the defined types were placed in this encounter.  Disposition:   FU with me in 12  months  Signed, Lauree [place], MD 10/25/2019 4:23 PM    [facility] [place], [place], [place]  [address] Phone: [phone]; Fax: [phone]

## 2019-10-25 NOTE — Patient Instructions (Signed)

## 2019-11-20 LAB — CUP PACEART REMOTE DEVICE CHECK
Date Time Interrogation Session: 20210819002001
Implantable Pulse Generator Implant Date: 20191206

## 2019-11-22 ENCOUNTER — Ambulatory Visit (INDEPENDENT_AMBULATORY_CARE_PROVIDER_SITE_OTHER): Payer: Medicare HMO | Admitting: *Deleted

## 2019-11-22 DIAGNOSIS — I639 Cerebral infarction, unspecified: Secondary | ICD-10-CM

## 2019-11-29 NOTE — Progress Notes (Signed)
Carelink Summary Report / Loop Recorder 

## 2019-12-26 LAB — CUP PACEART REMOTE DEVICE CHECK
Date Time Interrogation Session: 20210919010608
Implantable Pulse Generator Implant Date: 20191206

## 2019-12-27 ENCOUNTER — Ambulatory Visit (INDEPENDENT_AMBULATORY_CARE_PROVIDER_SITE_OTHER): Payer: Medicare HMO | Admitting: Emergency Medicine

## 2019-12-27 DIAGNOSIS — G460 Middle cerebral artery syndrome: Secondary | ICD-10-CM | POA: Diagnosis not present

## 2019-12-30 NOTE — Progress Notes (Signed)
Carelink Summary Report / Loop Recorder 

## 2020-01-04 DIAGNOSIS — I25119 Atherosclerotic heart disease of native coronary artery with unspecified angina pectoris: Secondary | ICD-10-CM | POA: Diagnosis not present

## 2020-01-04 DIAGNOSIS — M1990 Unspecified osteoarthritis, unspecified site: Secondary | ICD-10-CM | POA: Diagnosis not present

## 2020-01-04 DIAGNOSIS — I110 Hypertensive heart disease with heart failure: Secondary | ICD-10-CM | POA: Diagnosis not present

## 2020-01-04 DIAGNOSIS — Z008 Encounter for other general examination: Secondary | ICD-10-CM | POA: Diagnosis not present

## 2020-01-04 DIAGNOSIS — E039 Hypothyroidism, unspecified: Secondary | ICD-10-CM | POA: Diagnosis not present

## 2020-01-04 DIAGNOSIS — E785 Hyperlipidemia, unspecified: Secondary | ICD-10-CM | POA: Diagnosis not present

## 2020-01-04 DIAGNOSIS — G8929 Other chronic pain: Secondary | ICD-10-CM | POA: Diagnosis not present

## 2020-01-04 DIAGNOSIS — I509 Heart failure, unspecified: Secondary | ICD-10-CM | POA: Diagnosis not present

## 2020-01-04 DIAGNOSIS — K5900 Constipation, unspecified: Secondary | ICD-10-CM | POA: Diagnosis not present

## 2020-01-04 DIAGNOSIS — I252 Old myocardial infarction: Secondary | ICD-10-CM | POA: Diagnosis not present

## 2020-01-04 DIAGNOSIS — E1159 Type 2 diabetes mellitus with other circulatory complications: Secondary | ICD-10-CM | POA: Diagnosis not present

## 2020-01-19 ENCOUNTER — Ambulatory Visit (INDEPENDENT_AMBULATORY_CARE_PROVIDER_SITE_OTHER): Payer: Medicare HMO

## 2020-01-19 DIAGNOSIS — I639 Cerebral infarction, unspecified: Secondary | ICD-10-CM | POA: Diagnosis not present

## 2020-01-19 LAB — CUP PACEART REMOTE DEVICE CHECK
Date Time Interrogation Session: 20211020024226
Implantable Pulse Generator Implant Date: 20191206

## 2020-01-25 NOTE — Progress Notes (Signed)
Carelink Summary Report / Loop Recorder 

## 2020-01-26 ENCOUNTER — Encounter: Payer: Medicare HMO | Attending: Physical Medicine & Rehabilitation | Admitting: Physical Medicine & Rehabilitation

## 2020-02-16 ENCOUNTER — Encounter: Payer: Medicare HMO | Attending: Physical Medicine & Rehabilitation | Admitting: Physical Medicine & Rehabilitation

## 2020-02-16 ENCOUNTER — Encounter: Payer: Self-pay | Admitting: Physical Medicine & Rehabilitation

## 2020-02-16 ENCOUNTER — Other Ambulatory Visit: Payer: Self-pay

## 2020-02-16 VITALS — BP 129/84 | HR 70 | Temp 98.2°F | Ht 63.0 in | Wt 183.4 lb

## 2020-02-16 DIAGNOSIS — I63411 Cerebral infarction due to embolism of right middle cerebral artery: Secondary | ICD-10-CM | POA: Insufficient documentation

## 2020-02-16 DIAGNOSIS — M75101 Unspecified rotator cuff tear or rupture of right shoulder, not specified as traumatic: Secondary | ICD-10-CM | POA: Insufficient documentation

## 2020-02-16 DIAGNOSIS — M47816 Spondylosis without myelopathy or radiculopathy, lumbar region: Secondary | ICD-10-CM

## 2020-02-16 NOTE — Patient Instructions (Addendum)
CONTINUE WITH YOUR HOME EXERCISE PROGRAM   YOU NEED TO MAKE YOUR POSTURE AND GOOD MECHANICS A PRIORITY!  EXERCISE DAILY!

## 2020-02-16 NOTE — Progress Notes (Signed)
Subjective:    Patient ID: Theresa Ray, female    DOB: 1945/05/25, 74 y.o.   MRN: [id number]  HPI  Mrs. 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  She has continued to make progress with her mobility.  Therapy focused on some of her back pain which seemed to help as well.  She is doing some stretches which seem to benefit her.  She can go about 10 minutes or so with standing or walking before she does develop some low back pain.  She will sit down to do some stretches which tend to relieve things.  Husband notices improvement in her speech and language as well as her safety awareness.  She still has some delays with her processing and with synthesis of information but is trending up here as well.   Pain Inventory Average Pain 0 Pain Right Now 0 My pain is no pain  In the last 24 hours, has pain interfered with the following? General activity 0 Relation with others 0 Enjoyment of life 0 What TIME of day is your pain at its worst? daytime Sleep (in general) NA  Pain is worse with: walking and standing Pain improves with: rest Relief from Meds: 2  Family History  Problem Relation Age of Onset  . Heart disease Mother 77       CABG  . CVA Father    Social History   Socioeconomic History  . Marital status: Married    Spouse name: Not on file  . Number of children: Not on file  . Years of education: Not on file  . Highest education level: Not on file  Occupational History  . Not on file  Tobacco Use  . Smoking status: Former Smoker    Types: Cigarettes    Start date: 1967    Quit date: 1975    Years since quitting: 46.9  . Smokeless tobacco: Never Used  Vaping Use  . Vaping Use: Never used  Substance and Sexual Activity  . Alcohol use: No  . Drug use: No  . Sexual activity: Not on file  Other Topics Concern  . Not on file  Social History Narrative  . Not on file   Social Determinants of Health     Financial Resource Strain:   . Difficulty of Paying Living Expenses: Not on file  Food Insecurity:   . Worried About Charity fundraiser in the Last Year: Not on file  . Ran Out of Food in the Last Year: Not on file  Transportation Needs:   . Lack of Transportation (Medical): Not on file  . Lack of Transportation (Non-Medical): Not on file  Physical Activity:   . Days of Exercise per Week: Not on file  . Minutes of Exercise per Session: Not on file  Stress:   . Feeling of Stress : Not on file  Social Connections:   . Frequency of Communication with Friends and Family: Not on file  . Frequency of Social Gatherings with Friends and Family: Not on file  . Attends Religious Services: Not on file  . Active Member of Clubs or Organizations: Not on file  . Attends Archivist Meetings: Not on file  . Marital Status: Not on file   Past Surgical History:  Procedure Laterality Date  . ABDOMINAL HYSTERECTOMY    . CORONARY ARTERY BYPASS GRAFT N/A 02/24/2017   Procedure: CORONARY ARTERY BYPASS GRAFTING (CABG) USING  LEFT INTERNAL MAMMARY ARTERY TO LAD AND ENDOSCOPICALLY HARVESTED GREATER SAPHENOUS VEIN TO PDA AND TO OM1.;  Surgeon: Grace Isaac, MD;  Location: [place];  Service: Open Heart Surgery;  Laterality: N/A;  . ENDOVEIN HARVEST OF GREATER SAPHENOUS VEIN Right 02/24/2017   Procedure: ENDOVEIN HARVEST OF GREATER SAPHENOUS VEIN;  Surgeon: Grace Isaac, MD;  Location: [place];  Service: Open Heart Surgery;  Laterality: Right;  . LAPAROSCOPIC GASTRIC BANDING    . LEFT HEART CATH AND CORONARY ANGIOGRAPHY N/A 02/18/2017   Procedure: LEFT HEART CATH AND CORONARY ANGIOGRAPHY;  Surgeon: Leonie Man, MD;  Location: [place] [facility];  Service: Cardiovascular;  Laterality: N/A;  . LOOP RECORDER INSERTION N/A 03/06/2018   Procedure: LOOP RECORDER INSERTION;  Surgeon: Constance Haw, MD;  Location: [place] [facility];  Service: Cardiovascular;  Laterality: N/A;  . TEE  WITHOUT CARDIOVERSION N/A 02/24/2017   Procedure: TRANSESOPHAGEAL ECHOCARDIOGRAM (TEE);  Surgeon: Grace Isaac, MD;  Location: [place];  Service: Open Heart Surgery;  Laterality: N/A;  . TEE WITHOUT CARDIOVERSION N/A 03/06/2018   Procedure: TRANSESOPHAGEAL ECHOCARDIOGRAM (TEE);  Surgeon: Dorothy Spark, MD;  Location: [facility] ENDOSCOPY;  Service: Cardiovascular;  Laterality: N/A;  loop   Past Surgical History:  Procedure Laterality Date  . ABDOMINAL HYSTERECTOMY    . CORONARY ARTERY BYPASS GRAFT N/A 02/24/2017   Procedure: CORONARY ARTERY BYPASS GRAFTING (CABG) USING LEFT INTERNAL MAMMARY ARTERY TO LAD AND ENDOSCOPICALLY HARVESTED GREATER SAPHENOUS VEIN TO PDA AND TO OM1.;  Surgeon: Grace Isaac, MD;  Location: [place];  Service: Open Heart Surgery;  Laterality: N/A;  . ENDOVEIN HARVEST OF GREATER SAPHENOUS VEIN Right 02/24/2017   Procedure: ENDOVEIN HARVEST OF GREATER SAPHENOUS VEIN;  Surgeon: Grace Isaac, MD;  Location: [place];  Service: Open Heart Surgery;  Laterality: Right;  . LAPAROSCOPIC GASTRIC BANDING    . LEFT HEART CATH AND CORONARY ANGIOGRAPHY N/A 02/18/2017   Procedure: LEFT HEART CATH AND CORONARY ANGIOGRAPHY;  Surgeon: Leonie Man, MD;  Location: [place] [facility];  Service: Cardiovascular;  Laterality: N/A;  . LOOP RECORDER INSERTION N/A 03/06/2018   Procedure: LOOP RECORDER INSERTION;  Surgeon: Constance Haw, MD;  Location: [place] [facility];  Service: Cardiovascular;  Laterality: N/A;  . TEE WITHOUT CARDIOVERSION N/A 02/24/2017   Procedure: TRANSESOPHAGEAL ECHOCARDIOGRAM (TEE);  Surgeon: Grace Isaac, MD;  Location: [place];  Service: Open Heart Surgery;  Laterality: N/A;  . TEE WITHOUT CARDIOVERSION N/A 03/06/2018   Procedure: TRANSESOPHAGEAL ECHOCARDIOGRAM (TEE);  Surgeon: Dorothy Spark, MD;  Location: [facility] ENDOSCOPY;  Service: Cardiovascular;  Laterality: N/A;  loop   Past Medical History:  Diagnosis Date  . CAD in native artery    a. s/p  CABGx3 in 01/2017.  Marland Kitchen Chronic diastolic CHF (congestive heart failure) ([place])   . Diabetes mellitus ([place])   . Diabetic neuropathy ([place])   . History of kidney stones    history of stones  . Hypertension   . Hypothyroidism   . NSTEMI (non-ST elevated myocardial infarction) ([place])   . Orthostatic hypotension   . Postoperative atrial fibrillation ([place])    a. after CABG 01/2017.  Marland Kitchen TIA (transient ischemic attack)    BP 129/84   Pulse 70   Temp 98.2 F (36.8 C)   Ht 5\' 3"  (1.6 m)   Wt 83.2 kg   LMP  (LMP Unknown)   SpO2 99%   BMI 32.49 kg/m   Opioid Risk Score:   Fall  Risk Score:  `1  Depression screen PHQ 2/9  Depression screen [facility] 2/9 02/16/2020 05/06/2018 04/07/2018  Decreased Interest 0 3 0  Down, Depressed, Hopeless 0 3 0  PHQ - 2 Score 0 6 0  Altered sleeping - 2 1  Tired, decreased energy - 3 1  Change in appetite - 3 0  Feeling bad or failure about yourself  - 2 0  Trouble concentrating - 2 1  Moving slowly or fidgety/restless - 0 1  Suicidal thoughts - 2 0  PHQ-9 Score - 20 4  Difficult doing work/chores - Not difficult at all Not difficult at all   Review of Systems  Constitutional: Negative.   HENT: Negative.   Eyes: Negative.   Respiratory: Negative.   Cardiovascular: Negative.   Gastrointestinal: Negative.   Endocrine: Negative.   Genitourinary: Negative.   Musculoskeletal: Positive for back pain.  Skin: Negative.   Allergic/Immunologic: Negative.   Neurological: Negative.   Hematological: Negative.   Psychiatric/Behavioral: Negative.   All other systems reviewed and are negative.      Objective:   Physical Exam  General: No acute distress HEENT: EOMI, oral membranes moist Cards: reg rate  Chest: normal effort Abdomen: Soft, NT, ND Skin: dry, intact Extremities: no edema  Neuro: language, thought content improved Remains Apraxic at times but more fluid Motor function is grossly 5 out of 5 right upper extremity. balance much  improved Musculoskeletal: less pain with palpation in LB.  Facet testing remains + bilaterally. Still with more pain with general extension also. Tends to lean forward at baseline. Thoracic kyphosis. Shoulder rounded, head forward. right shoulder still limited.   Psych: Pt's affect is appropriate. Pt is cooperative             Assessment & Plan:  1. Left hemiparesis after right MCA, MCA/PCA and left punctate MCA infarctions            -ongoing progress with language/apraxia  -encouraged regular use of language, thought provoking activity 2. Hypertension: PER Primary             3.  Type 2 diabetes: Per primary care physician. 4.  History of hydronephrosis with urinary obstruction due to renal calculus: Patient status post double-J stents.  Urology sees patient. 5. Right rotator cuff injury/tear, new left humerus fx, may have RTC injury there as well             -per ortho, still remains somewhat limited 6. Low back pain           -most c/w lumbar with facet arthropathy.  Recent imaging was most notable for a T12 compression deformity           -prn robaxin low dose 250mg            -tylenol           -heat           -HEP with focus on better posture, neck/head extension, ER of shoulder  -to pursue any interventional treatments we would need a CT or MRI             15 MINUTES of face to face patient care time were spent during this visit. All questions were encouraged and answered.  Follow up with me in 3 months .

## 2020-02-21 ENCOUNTER — Ambulatory Visit (INDEPENDENT_AMBULATORY_CARE_PROVIDER_SITE_OTHER): Payer: Medicare HMO

## 2020-02-21 DIAGNOSIS — I639 Cerebral infarction, unspecified: Secondary | ICD-10-CM

## 2020-02-21 LAB — CUP PACEART REMOTE DEVICE CHECK
Date Time Interrogation Session: 20211120014227
Implantable Pulse Generator Implant Date: 20191206

## 2020-02-28 NOTE — Progress Notes (Signed)
Carelink Summary Report / Loop Recorder 

## 2020-03-04 DIAGNOSIS — Z1231 Encounter for screening mammogram for malignant neoplasm of breast: Secondary | ICD-10-CM | POA: Diagnosis not present

## 2020-03-13 DIAGNOSIS — Z79899 Other long term (current) drug therapy: Secondary | ICD-10-CM | POA: Diagnosis not present

## 2020-03-13 DIAGNOSIS — Z6832 Body mass index (BMI) 32.0-32.9, adult: Secondary | ICD-10-CM | POA: Diagnosis not present

## 2020-03-13 DIAGNOSIS — E039 Hypothyroidism, unspecified: Secondary | ICD-10-CM | POA: Diagnosis not present

## 2020-03-13 DIAGNOSIS — E1165 Type 2 diabetes mellitus with hyperglycemia: Secondary | ICD-10-CM | POA: Diagnosis not present

## 2020-03-13 DIAGNOSIS — Z1331 Encounter for screening for depression: Secondary | ICD-10-CM | POA: Diagnosis not present

## 2020-03-13 DIAGNOSIS — E7800 Pure hypercholesterolemia, unspecified: Secondary | ICD-10-CM | POA: Diagnosis not present

## 2020-03-13 DIAGNOSIS — Z0000 Encounter for general adult medical examination without abnormal findings: Secondary | ICD-10-CM | POA: Diagnosis not present

## 2020-03-21 ENCOUNTER — Other Ambulatory Visit: Payer: Self-pay | Admitting: Cardiovascular Disease

## 2020-04-21 ENCOUNTER — Ambulatory Visit (INDEPENDENT_AMBULATORY_CARE_PROVIDER_SITE_OTHER): Payer: Medicare HMO

## 2020-04-21 DIAGNOSIS — I639 Cerebral infarction, unspecified: Secondary | ICD-10-CM | POA: Diagnosis not present

## 2020-04-21 LAB — CUP PACEART REMOTE DEVICE CHECK
Date Time Interrogation Session: 20220121021451
Implantable Pulse Generator Implant Date: 20191206

## 2020-05-03 NOTE — Progress Notes (Signed)
Carelink Summary Report / Loop Recorder 

## 2020-05-17 ENCOUNTER — Encounter: Payer: Self-pay | Admitting: Physical Medicine & Rehabilitation

## 2020-05-17 ENCOUNTER — Other Ambulatory Visit: Payer: Self-pay

## 2020-05-17 ENCOUNTER — Encounter: Payer: Medicare HMO | Attending: Physical Medicine & Rehabilitation | Admitting: Physical Medicine & Rehabilitation

## 2020-05-17 VITALS — BP 112/77 | HR 67 | Temp 97.9°F | Ht 63.0 in | Wt 189.2 lb

## 2020-05-17 DIAGNOSIS — R269 Unspecified abnormalities of gait and mobility: Secondary | ICD-10-CM | POA: Diagnosis not present

## 2020-05-17 DIAGNOSIS — I63411 Cerebral infarction due to embolism of right middle cerebral artery: Secondary | ICD-10-CM

## 2020-05-17 NOTE — Progress Notes (Signed)
Subjective:    Patient ID: Theresa Ray, female    DOB: 09/28/1945, 75 y.o.   MRN: [id number]  HPI  Theresa Ray is here in follow-up of her right MCA infarct.  The most part she has been doing fairly well since we last met.  She did have 2 falls over the last couple months.  One of them took place when she was walking in her den and she just lost her balance.  She is not sure exactly what happened.  She did not lose consciousness or hurt her self fortunately.  The other fall happened when she just fell out of bed 1 night while she was sleeping.  Apparently she likes sleeping near the edge of the bed.  She does have a rail and is trying to center herself more centrally on the mattress now.  She says that she saw a ad for a center who focuses on peripheral neuropathy at [facility].  She had some interest in seeing what they can offer her.  She does deny pain in her feet.  She does report some sensory loss however.  Family reports that she is not moving a whole lot on her own at home.  She does tend to be somewhat sedentary apparently.  She is doing more for herself at home.  She does not like to use her walker and mostly is ambulating without any adaptive device.    Pain Inventory Average Pain 6 Pain Right Now 6 My pain is constant  In the last 24 hours, has pain interfered with the following? General activity 4 Relation with others 4 Enjoyment of life 4 What TIME of day is your pain at its worst? evening Sleep (in general) Good  Pain is worse with: standing Pain improves with: rest Relief from Meds: 0  Family History  Problem Relation Age of Onset  . Heart disease Mother 81       CABG  . CVA Father    Social History   Socioeconomic History  . Marital status: Married    Spouse name: Not on file  . Number of children: Not on file  . Years of education: Not on file  . Highest education level: Not on file  Occupational History  . Not on file  Tobacco Use  . Smoking  status: Former Smoker    Types: Cigarettes    Start date: 1967    Quit date: 1975    Years since quitting: 47.1  . Smokeless tobacco: Never Used  Vaping Use  . Vaping Use: Never used  Substance and Sexual Activity  . Alcohol use: No  . Drug use: No  . Sexual activity: Not on file  Other Topics Concern  . Not on file  Social History Narrative  . Not on file   Social Determinants of Health   Financial Resource Strain: Not on file  Food Insecurity: Not on file  Transportation Needs: Not on file  Physical Activity: Not on file  Stress: Not on file  Social Connections: Not on file   Past Surgical History:  Procedure Laterality Date  . ABDOMINAL HYSTERECTOMY    . CORONARY ARTERY BYPASS GRAFT N/A 02/24/2017   Procedure: CORONARY ARTERY BYPASS GRAFTING (CABG) USING LEFT INTERNAL MAMMARY ARTERY TO LAD AND ENDOSCOPICALLY HARVESTED GREATER SAPHENOUS VEIN TO PDA AND TO OM1.;  Surgeon: Grace Isaac, MD;  Location: [place];  Service: Open Heart Surgery;  Laterality: N/A;  . ENDOVEIN HARVEST OF GREATER SAPHENOUS VEIN Right  02/24/2017   Procedure: ENDOVEIN HARVEST OF GREATER SAPHENOUS VEIN;  Surgeon: Grace Isaac, MD;  Location: [place];  Service: Open Heart Surgery;  Laterality: Right;  . LAPAROSCOPIC GASTRIC BANDING    . LEFT HEART CATH AND CORONARY ANGIOGRAPHY N/A 02/18/2017   Procedure: LEFT HEART CATH AND CORONARY ANGIOGRAPHY;  Surgeon: Leonie Man, MD;  Location: [place] [facility];  Service: Cardiovascular;  Laterality: N/A;  . LOOP RECORDER INSERTION N/A 03/06/2018   Procedure: LOOP RECORDER INSERTION;  Surgeon: Constance Haw, MD;  Location: [place] [facility];  Service: Cardiovascular;  Laterality: N/A;  . TEE WITHOUT CARDIOVERSION N/A 02/24/2017   Procedure: TRANSESOPHAGEAL ECHOCARDIOGRAM (TEE);  Surgeon: Grace Isaac, MD;  Location: [place];  Service: Open Heart Surgery;  Laterality: N/A;  . TEE WITHOUT CARDIOVERSION N/A 03/06/2018   Procedure:  TRANSESOPHAGEAL ECHOCARDIOGRAM (TEE);  Surgeon: Dorothy Spark, MD;  Location: [facility] ENDOSCOPY;  Service: Cardiovascular;  Laterality: N/A;  loop   Past Surgical History:  Procedure Laterality Date  . ABDOMINAL HYSTERECTOMY    . CORONARY ARTERY BYPASS GRAFT N/A 02/24/2017   Procedure: CORONARY ARTERY BYPASS GRAFTING (CABG) USING LEFT INTERNAL MAMMARY ARTERY TO LAD AND ENDOSCOPICALLY HARVESTED GREATER SAPHENOUS VEIN TO PDA AND TO OM1.;  Surgeon: Grace Isaac, MD;  Location: [place];  Service: Open Heart Surgery;  Laterality: N/A;  . ENDOVEIN HARVEST OF GREATER SAPHENOUS VEIN Right 02/24/2017   Procedure: ENDOVEIN HARVEST OF GREATER SAPHENOUS VEIN;  Surgeon: Grace Isaac, MD;  Location: [place];  Service: Open Heart Surgery;  Laterality: Right;  . LAPAROSCOPIC GASTRIC BANDING    . LEFT HEART CATH AND CORONARY ANGIOGRAPHY N/A 02/18/2017   Procedure: LEFT HEART CATH AND CORONARY ANGIOGRAPHY;  Surgeon: Leonie Man, MD;  Location: [place] [facility];  Service: Cardiovascular;  Laterality: N/A;  . LOOP RECORDER INSERTION N/A 03/06/2018   Procedure: LOOP RECORDER INSERTION;  Surgeon: Constance Haw, MD;  Location: [place] [facility];  Service: Cardiovascular;  Laterality: N/A;  . TEE WITHOUT CARDIOVERSION N/A 02/24/2017   Procedure: TRANSESOPHAGEAL ECHOCARDIOGRAM (TEE);  Surgeon: Grace Isaac, MD;  Location: [place];  Service: Open Heart Surgery;  Laterality: N/A;  . TEE WITHOUT CARDIOVERSION N/A 03/06/2018   Procedure: TRANSESOPHAGEAL ECHOCARDIOGRAM (TEE);  Surgeon: Dorothy Spark, MD;  Location: [facility] ENDOSCOPY;  Service: Cardiovascular;  Laterality: N/A;  loop   Past Medical History:  Diagnosis Date  . CAD in native artery    a. s/p CABGx3 in 01/2017.  Marland Kitchen Chronic diastolic CHF (congestive heart failure) ([place])   . Diabetes mellitus ([place])   . Diabetic neuropathy ([place])   . History of kidney stones    history of stones  . Hypertension   . Hypothyroidism   . NSTEMI (non-ST  elevated myocardial infarction) ([place])   . Orthostatic hypotension   . Postoperative atrial fibrillation ([place])    a. after CABG 01/2017.  Marland Kitchen TIA (transient ischemic attack)    BP 112/77   Pulse 67   Temp 97.9 F (36.6 C)   Ht '5\' 3"'  (1.6 m)   Wt 189 lb 3.2 oz (85.8 kg)   LMP  (LMP Unknown)   SpO2 96%   BMI 33.52 kg/m   Opioid Risk Score:   Fall Risk Score:  `1  Depression screen PHQ 2/9  Depression screen [facility] 2/9 02/16/2020 05/06/2018 04/07/2018  Decreased Interest 0 3 0  Down, Depressed, Hopeless 0 3 0  PHQ - 2 Score 0 6 0  Altered  sleeping - 2 1  Tired, decreased energy - 3 1  Change in appetite - 3 0  Feeling bad or failure about yourself  - 2 0  Trouble concentrating - 2 1  Moving slowly or fidgety/restless - 0 1  Suicidal thoughts - 2 0  PHQ-9 Score - 20 4  Difficult doing work/chores - Not difficult at all Not difficult at all     Review of Systems  Musculoskeletal: Positive for back pain.  All other systems reviewed and are negative.      Objective:   Physical Exam General: No acute distress HEENT: EOMI, oral membranes moist Cards: reg rate  Chest: normal effort Abdomen: Soft, NT, ND Skin: dry, intact Extremities: no edema Psych: pleasant and appropriate  Neuro: improve language. Better processing, insight and memory. Motor function is grossly 5 out of 5 right upper extremity and left upper extremity.  5 out of 5 in both lower extremities today.  No focal sensory loss was appreciated today even distally in the feet.. balance much improved but narrow base of gait. Lost balance when changing directions Musculoskeletal: thoracic kyphosis, low back ttp. Stood fairly easily. . Shoulder rounded, head forward. right shoulder still limited.                 Assessment & Plan:  1. 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  2. Hypertension: PER Primary             3.  Type 2 diabetes: Per primary care physician. 4.  History of hydronephrosis with urinary obstruction due to renal calculus:     5. Right rotator cuff injury/tear, new left humerus fx, may have RTC injury there as well             -per ortho 6. Low back pain: stable           -STIL c/w lumbar with facet arthropathy.              -prn flexeril           -tylenol           -heating pad           -HEP, increase activity. Better posture             15 MINUTES of face to face patient care time were spent during this visit. All questions were encouraged and answered.  Follow up with me in 3 months .

## 2020-05-17 NOTE — Patient Instructions (Addendum)
PLEASE FEEL FREE TO CALL OUR OFFICE WITH ANY PROBLEMS OR QUESTIONS ([phone])   WORK ON WALKING WITH YOUR FEET ANOTHER 3" OR 4" APART. IT WILL HELP WITH YOUR CENTER OF SUPPORT AND WILL KEEP YOUR FEET FROM HITTING EACH OTHER WHEN YOU SWING YOUR LEG.   YOU NEED TO PRACTICE IT EVERY DAY TO "RE-TRAIN "  YOUR BRAIN!!'    BACK: STAND TALL! HEAT GOOD POSTURE WHEN YOUR STANDING OR SITTING BE ACTIVE---YOU HAVE TO USE THE MUSCLES IN YOUR BACK

## 2020-05-22 ENCOUNTER — Ambulatory Visit (INDEPENDENT_AMBULATORY_CARE_PROVIDER_SITE_OTHER): Payer: Medicare HMO

## 2020-05-22 DIAGNOSIS — I639 Cerebral infarction, unspecified: Secondary | ICD-10-CM | POA: Diagnosis not present

## 2020-05-23 LAB — CUP PACEART REMOTE DEVICE CHECK
Date Time Interrogation Session: 20220221032520
Implantable Pulse Generator Implant Date: 20191206

## 2020-05-25 NOTE — Progress Notes (Signed)
Carelink Summary Report / Loop Recorder 

## 2020-06-22 ENCOUNTER — Ambulatory Visit (INDEPENDENT_AMBULATORY_CARE_PROVIDER_SITE_OTHER): Payer: Medicare HMO

## 2020-06-22 DIAGNOSIS — I639 Cerebral infarction, unspecified: Secondary | ICD-10-CM

## 2020-06-22 LAB — CUP PACEART REMOTE DEVICE CHECK
Date Time Interrogation Session: 20220324045220
Implantable Pulse Generator Implant Date: 20191206

## 2020-06-28 DIAGNOSIS — E039 Hypothyroidism, unspecified: Secondary | ICD-10-CM | POA: Diagnosis not present

## 2020-06-28 DIAGNOSIS — E7800 Pure hypercholesterolemia, unspecified: Secondary | ICD-10-CM | POA: Diagnosis not present

## 2020-06-28 DIAGNOSIS — I10 Essential (primary) hypertension: Secondary | ICD-10-CM | POA: Diagnosis not present

## 2020-06-29 DIAGNOSIS — L820 Inflamed seborrheic keratosis: Secondary | ICD-10-CM | POA: Diagnosis not present

## 2020-06-29 DIAGNOSIS — D1721 Benign lipomatous neoplasm of skin and subcutaneous tissue of right arm: Secondary | ICD-10-CM | POA: Diagnosis not present

## 2020-07-04 NOTE — Progress Notes (Signed)
Carelink Summary Report / Loop Recorder 

## 2020-07-07 DIAGNOSIS — D1721 Benign lipomatous neoplasm of skin and subcutaneous tissue of right arm: Secondary | ICD-10-CM | POA: Diagnosis not present

## 2020-07-11 DIAGNOSIS — E039 Hypothyroidism, unspecified: Secondary | ICD-10-CM | POA: Diagnosis not present

## 2020-07-11 DIAGNOSIS — Z79899 Other long term (current) drug therapy: Secondary | ICD-10-CM | POA: Diagnosis not present

## 2020-07-21 DIAGNOSIS — I509 Heart failure, unspecified: Secondary | ICD-10-CM | POA: Diagnosis not present

## 2020-07-21 DIAGNOSIS — M1990 Unspecified osteoarthritis, unspecified site: Secondary | ICD-10-CM | POA: Diagnosis not present

## 2020-07-21 DIAGNOSIS — N139 Obstructive and reflux uropathy, unspecified: Secondary | ICD-10-CM | POA: Diagnosis not present

## 2020-07-21 DIAGNOSIS — R1030 Lower abdominal pain, unspecified: Secondary | ICD-10-CM | POA: Diagnosis not present

## 2020-07-21 DIAGNOSIS — N132 Hydronephrosis with renal and ureteral calculous obstruction: Secondary | ICD-10-CM | POA: Diagnosis not present

## 2020-07-21 DIAGNOSIS — N170 Acute kidney failure with tubular necrosis: Secondary | ICD-10-CM | POA: Diagnosis not present

## 2020-07-21 DIAGNOSIS — N135 Crossing vessel and stricture of ureter without hydronephrosis: Secondary | ICD-10-CM | POA: Diagnosis not present

## 2020-07-21 DIAGNOSIS — S22080A Wedge compression fracture of T11-T12 vertebra, initial encounter for closed fracture: Secondary | ICD-10-CM | POA: Diagnosis not present

## 2020-07-21 DIAGNOSIS — N390 Urinary tract infection, site not specified: Secondary | ICD-10-CM | POA: Diagnosis not present

## 2020-07-21 DIAGNOSIS — N179 Acute kidney failure, unspecified: Secondary | ICD-10-CM | POA: Diagnosis not present

## 2020-07-21 DIAGNOSIS — I110 Hypertensive heart disease with heart failure: Secondary | ICD-10-CM | POA: Diagnosis not present

## 2020-07-21 DIAGNOSIS — B9689 Other specified bacterial agents as the cause of diseases classified elsewhere: Secondary | ICD-10-CM | POA: Diagnosis not present

## 2020-07-21 DIAGNOSIS — N1330 Unspecified hydronephrosis: Secondary | ICD-10-CM | POA: Diagnosis not present

## 2020-07-21 DIAGNOSIS — E039 Hypothyroidism, unspecified: Secondary | ICD-10-CM | POA: Diagnosis not present

## 2020-07-21 DIAGNOSIS — N3001 Acute cystitis with hematuria: Secondary | ICD-10-CM | POA: Diagnosis not present

## 2020-07-21 DIAGNOSIS — N261 Atrophy of kidney (terminal): Secondary | ICD-10-CM | POA: Diagnosis not present

## 2020-07-21 DIAGNOSIS — N1339 Other hydronephrosis: Secondary | ICD-10-CM | POA: Diagnosis not present

## 2020-07-22 DIAGNOSIS — N1330 Unspecified hydronephrosis: Secondary | ICD-10-CM | POA: Diagnosis not present

## 2020-07-22 DIAGNOSIS — N170 Acute kidney failure with tubular necrosis: Secondary | ICD-10-CM | POA: Diagnosis not present

## 2020-07-22 DIAGNOSIS — N390 Urinary tract infection, site not specified: Secondary | ICD-10-CM | POA: Diagnosis not present

## 2020-07-23 DIAGNOSIS — N390 Urinary tract infection, site not specified: Secondary | ICD-10-CM | POA: Diagnosis not present

## 2020-07-23 DIAGNOSIS — N1330 Unspecified hydronephrosis: Secondary | ICD-10-CM | POA: Diagnosis not present

## 2020-07-23 DIAGNOSIS — N170 Acute kidney failure with tubular necrosis: Secondary | ICD-10-CM | POA: Diagnosis not present

## 2020-07-24 DIAGNOSIS — N1330 Unspecified hydronephrosis: Secondary | ICD-10-CM | POA: Diagnosis not present

## 2020-07-24 DIAGNOSIS — N390 Urinary tract infection, site not specified: Secondary | ICD-10-CM | POA: Diagnosis not present

## 2020-07-24 DIAGNOSIS — N170 Acute kidney failure with tubular necrosis: Secondary | ICD-10-CM | POA: Diagnosis not present

## 2020-07-25 DIAGNOSIS — N1330 Unspecified hydronephrosis: Secondary | ICD-10-CM | POA: Diagnosis not present

## 2020-07-25 DIAGNOSIS — N170 Acute kidney failure with tubular necrosis: Secondary | ICD-10-CM | POA: Diagnosis not present

## 2020-07-25 DIAGNOSIS — N390 Urinary tract infection, site not specified: Secondary | ICD-10-CM | POA: Diagnosis not present

## 2020-07-29 DIAGNOSIS — I10 Essential (primary) hypertension: Secondary | ICD-10-CM | POA: Diagnosis not present

## 2020-07-29 DIAGNOSIS — E7800 Pure hypercholesterolemia, unspecified: Secondary | ICD-10-CM | POA: Diagnosis not present

## 2020-07-29 DIAGNOSIS — E039 Hypothyroidism, unspecified: Secondary | ICD-10-CM | POA: Diagnosis not present

## 2020-08-03 DIAGNOSIS — Z09 Encounter for follow-up examination after completed treatment for conditions other than malignant neoplasm: Secondary | ICD-10-CM | POA: Diagnosis not present

## 2020-08-03 DIAGNOSIS — Z6832 Body mass index (BMI) 32.0-32.9, adult: Secondary | ICD-10-CM | POA: Diagnosis not present

## 2020-08-03 DIAGNOSIS — N390 Urinary tract infection, site not specified: Secondary | ICD-10-CM | POA: Diagnosis not present

## 2020-08-08 DIAGNOSIS — E669 Obesity, unspecified: Secondary | ICD-10-CM | POA: Diagnosis not present

## 2020-08-08 DIAGNOSIS — E119 Type 2 diabetes mellitus without complications: Secondary | ICD-10-CM | POA: Diagnosis not present

## 2020-08-08 DIAGNOSIS — E785 Hyperlipidemia, unspecified: Secondary | ICD-10-CM | POA: Diagnosis not present

## 2020-08-08 DIAGNOSIS — I4891 Unspecified atrial fibrillation: Secondary | ICD-10-CM | POA: Diagnosis not present

## 2020-08-08 DIAGNOSIS — D6869 Other thrombophilia: Secondary | ICD-10-CM | POA: Diagnosis not present

## 2020-08-08 DIAGNOSIS — G8929 Other chronic pain: Secondary | ICD-10-CM | POA: Diagnosis not present

## 2020-08-08 DIAGNOSIS — I10 Essential (primary) hypertension: Secondary | ICD-10-CM | POA: Diagnosis not present

## 2020-08-08 DIAGNOSIS — R69 Illness, unspecified: Secondary | ICD-10-CM | POA: Diagnosis not present

## 2020-08-08 DIAGNOSIS — E039 Hypothyroidism, unspecified: Secondary | ICD-10-CM | POA: Diagnosis not present

## 2020-08-16 ENCOUNTER — Encounter: Payer: Self-pay | Admitting: Physical Medicine & Rehabilitation

## 2020-08-16 ENCOUNTER — Other Ambulatory Visit: Payer: Self-pay

## 2020-08-16 ENCOUNTER — Encounter: Payer: Medicare HMO | Attending: Physical Medicine & Rehabilitation | Admitting: Physical Medicine & Rehabilitation

## 2020-08-16 VITALS — BP 129/84 | HR 77 | Temp 98.5°F | Ht 63.0 in | Wt 182.0 lb

## 2020-08-16 DIAGNOSIS — I63411 Cerebral infarction due to embolism of right middle cerebral artery: Secondary | ICD-10-CM | POA: Insufficient documentation

## 2020-08-16 NOTE — Progress Notes (Signed)
Subjective:    Patient ID: Theresa Ray, female    DOB: 1945-07-05, 75 y.o.   MRN: [id number]  HPI   Theresa Ray is here in follow up of her CVA. She was at [facility] for a UTI, dehydration last month. She has recovered and feeling better over the last few week. She does struggle with nausea in the hot weather and in fact had a vomitus bag with her today as the weather is heating up.  She is doing some walking at home some days, but a lot of the time is sedentary. She watches tv a lot and stays at home. She denies depression.  Husband states that she does more when he is around but otherwise does not tend to be very motivated.  I asked her if she has friends or people that she can talk to and she said that she did.  As far as her low back pain is concerned she is doing fairly well.  She does develop pain when she has been standing for longer periods of time.  She is not stretching consistently.  She has been working on her gait and more appropriate leg positioning given her narrow base of support.   Pain Inventory Average Pain 8 Pain Right Now 6 My pain is intermittent, aching and constant ache  In the last 24 hours, has pain interfered with the following? General activity 5 Relation with others 5 Enjoyment of life 5 What TIME of day is your pain at its worst? daytime Sleep (in general) Poor  Pain is worse with: walking, standing and some activites Pain improves with: rest Relief from Meds: fair  Family History  Problem Relation Age of Onset  . Heart disease Mother 11       CABG  . CVA Father    Social History   Socioeconomic History  . Marital status: Married    Spouse name: Not on file  . Number of children: Not on file  . Years of education: Not on file  . Highest education level: Not on file  Occupational History  . Not on file  Tobacco Use  . Smoking status: Former Smoker    Types: Cigarettes    Start date: 1967    Quit date: 1975    Years since  quitting: 47.4  . Smokeless tobacco: Never Used  Vaping Use  . Vaping Use: Never used  Substance and Sexual Activity  . Alcohol use: No  . Drug use: No  . Sexual activity: Not on file  Other Topics Concern  . Not on file  Social History Narrative  . Not on file   Social Determinants of Health   Financial Resource Strain: Not on file  Food Insecurity: Not on file  Transportation Needs: Not on file  Physical Activity: Not on file  Stress: Not on file  Social Connections: Not on file   Past Surgical History:  Procedure Laterality Date  . ABDOMINAL HYSTERECTOMY    . CORONARY ARTERY BYPASS GRAFT N/A 02/24/2017   Procedure: CORONARY ARTERY BYPASS GRAFTING (CABG) USING LEFT INTERNAL MAMMARY ARTERY TO LAD AND ENDOSCOPICALLY HARVESTED GREATER SAPHENOUS VEIN TO PDA AND TO OM1.;  Surgeon: Grace Isaac, MD;  Location: [place];  Service: Open Heart Surgery;  Laterality: N/A;  . ENDOVEIN HARVEST OF GREATER SAPHENOUS VEIN Right 02/24/2017   Procedure: ENDOVEIN HARVEST OF GREATER SAPHENOUS VEIN;  Surgeon: Grace Isaac, MD;  Location: [place];  Service: Open Heart Surgery;  Laterality: Right;  .  LAPAROSCOPIC GASTRIC BANDING    . LEFT HEART CATH AND CORONARY ANGIOGRAPHY N/A 02/18/2017   Procedure: LEFT HEART CATH AND CORONARY ANGIOGRAPHY;  Surgeon: Leonie Man, MD;  Location: [place] CV LAB;  Service: Cardiovascular;  Laterality: N/A;  . LOOP RECORDER INSERTION N/A 03/06/2018   Procedure: LOOP RECORDER INSERTION;  Surgeon: Constance Haw, MD;  Location: [place] CV LAB;  Service: Cardiovascular;  Laterality: N/A;  . TEE WITHOUT CARDIOVERSION N/A 02/24/2017   Procedure: TRANSESOPHAGEAL ECHOCARDIOGRAM (TEE);  Surgeon: Grace Isaac, MD;  Location: [place];  Service: Open Heart Surgery;  Laterality: N/A;  . TEE WITHOUT CARDIOVERSION N/A 03/06/2018   Procedure: TRANSESOPHAGEAL ECHOCARDIOGRAM (TEE);  Surgeon: Dorothy Spark, MD;  Location: [facility] ENDOSCOPY;  Service:  Cardiovascular;  Laterality: N/A;  loop   Past Surgical History:  Procedure Laterality Date  . ABDOMINAL HYSTERECTOMY    . CORONARY ARTERY BYPASS GRAFT N/A 02/24/2017   Procedure: CORONARY ARTERY BYPASS GRAFTING (CABG) USING LEFT INTERNAL MAMMARY ARTERY TO LAD AND ENDOSCOPICALLY HARVESTED GREATER SAPHENOUS VEIN TO PDA AND TO OM1.;  Surgeon: Grace Isaac, MD;  Location: [place];  Service: Open Heart Surgery;  Laterality: N/A;  . ENDOVEIN HARVEST OF GREATER SAPHENOUS VEIN Right 02/24/2017   Procedure: ENDOVEIN HARVEST OF GREATER SAPHENOUS VEIN;  Surgeon: Grace Isaac, MD;  Location: [place];  Service: Open Heart Surgery;  Laterality: Right;  . LAPAROSCOPIC GASTRIC BANDING    . LEFT HEART CATH AND CORONARY ANGIOGRAPHY N/A 02/18/2017   Procedure: LEFT HEART CATH AND CORONARY ANGIOGRAPHY;  Surgeon: Leonie Man, MD;  Location: [place] CV LAB;  Service: Cardiovascular;  Laterality: N/A;  . LOOP RECORDER INSERTION N/A 03/06/2018   Procedure: LOOP RECORDER INSERTION;  Surgeon: Constance Haw, MD;  Location: [place] CV LAB;  Service: Cardiovascular;  Laterality: N/A;  . TEE WITHOUT CARDIOVERSION N/A 02/24/2017   Procedure: TRANSESOPHAGEAL ECHOCARDIOGRAM (TEE);  Surgeon: Grace Isaac, MD;  Location: [place];  Service: Open Heart Surgery;  Laterality: N/A;  . TEE WITHOUT CARDIOVERSION N/A 03/06/2018   Procedure: TRANSESOPHAGEAL ECHOCARDIOGRAM (TEE);  Surgeon: Dorothy Spark, MD;  Location: [facility] ENDOSCOPY;  Service: Cardiovascular;  Laterality: N/A;  loop   Past Medical History:  Diagnosis Date  . CAD in native artery    a. s/p CABGx3 in 01/2017.  Marland Kitchen Chronic diastolic CHF (congestive heart failure) ([place])   . Diabetes mellitus ([place])   . Diabetic neuropathy ([place])   . History of kidney stones    history of stones  . Hypertension   . Hypothyroidism   . NSTEMI (non-ST elevated myocardial infarction) ([place])   . Orthostatic hypotension   . Postoperative atrial fibrillation  ([place])    a. after CABG 01/2017.  Marland Kitchen TIA (transient ischemic attack)    BP 129/84   Pulse 77   Temp 98.5 F (36.9 C)   Ht 5\' 3"  (1.6 m)   Wt 182 lb (82.6 kg)   LMP  (LMP Unknown)   SpO2 97%   BMI 32.24 kg/m   Opioid Risk Score:   Fall Risk Score:  `1  Depression screen PHQ 2/9  Depression screen [facility] 2/9 02/16/2020 05/06/2018 04/07/2018  Decreased Interest 0 3 0  Down, Depressed, Hopeless 0 3 0  PHQ - 2 Score 0 6 0  Altered sleeping - 2 1  Tired, decreased energy - 3 1  Change in appetite - 3 0  Feeling bad or failure about yourself  - 2 0  Trouble concentrating -  2 1  Moving slowly or fidgety/restless - 0 1  Suicidal thoughts - 2 0  PHQ-9 Score - 20 4  Difficult doing work/chores - Not difficult at all Not difficult at all   Review of Systems  Gastrointestinal: Positive for nausea.  Musculoskeletal: Positive for back pain.  All other systems reviewed and are negative.      Objective:   Physical Exam General: No acute distress HEENT: EOMI, oral membranes moist Cards: reg rate  Chest: normal effort Abdomen: Soft, NT, ND Skin: dry, intact Extremities: no edema Psych: pleasant and appropriate  Neuro: IMPROVED language, better context and fluidity. . Motor function is grossly 5/5 right upper extremity and left upper extremity.  5/5 in both lower extremities today.  narrow base of gait, slowed but functional.  Musculoskeletal: thoracic kyphosis.. . Shoulder rounded, head forward. right shoulder still limited.                 Assessment & Plan:  1. 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 Hypertension: under control             3.  Type 2 diabetes: Per primary care physician. 4.  History of hydronephrosis with urinary  obstruction due to renal calculus:              5. Right rotator cuff injury/tear, new left humerus fx, may have RTC injury there as well             -per ortho, some improvemeent 6. Low back pain: stable  -kyphotic posture, lumbar facet arthropathy           -REVIEWED stretches before and after activity             15 MINUTES of face to face patient care time were spent during this visit. All questions were encouraged and answered.  Follow up with me in 6 months .

## 2020-08-16 NOTE — Patient Instructions (Addendum)
PLEASE FEEL FREE TO CALL OUR OFFICE WITH ANY PROBLEMS OR QUESTIONS ([phone])  DAILY ACTIVITIES THAT YOU NEED TO DO MORE OF: 1. WALKING, EXERCISE 2. READ! 3. GO OUTSIDE ON YOUR PORCH 4. LIGHT DUTY HOUSE CHORES 5. TALK WITH YOUR FRIENDS, CHURCH 6. FIND NEW HOBBIES 7. STRETCHES FOR BACK DAILY 8. REMEMBER GOOD POSTURE WHEN YOU STAND AND WALK!   YOU NEED TO FILL YOUR DAY!   THINGS TO DO LESS OF: 1. SLEEPING DURING THE DAY.

## 2020-08-21 DIAGNOSIS — Z20822 Contact with and (suspected) exposure to covid-19: Secondary | ICD-10-CM | POA: Diagnosis not present

## 2020-08-23 DIAGNOSIS — R339 Retention of urine, unspecified: Secondary | ICD-10-CM | POA: Diagnosis not present

## 2020-08-23 DIAGNOSIS — N3946 Mixed incontinence: Secondary | ICD-10-CM | POA: Diagnosis not present

## 2020-08-23 DIAGNOSIS — N200 Calculus of kidney: Secondary | ICD-10-CM | POA: Diagnosis not present

## 2020-08-23 DIAGNOSIS — N1330 Unspecified hydronephrosis: Secondary | ICD-10-CM | POA: Diagnosis not present

## 2020-08-23 DIAGNOSIS — N952 Postmenopausal atrophic vaginitis: Secondary | ICD-10-CM | POA: Diagnosis not present

## 2020-08-23 DIAGNOSIS — R112 Nausea with vomiting, unspecified: Secondary | ICD-10-CM | POA: Diagnosis not present

## 2020-08-23 DIAGNOSIS — Z79899 Other long term (current) drug therapy: Secondary | ICD-10-CM | POA: Diagnosis not present

## 2020-08-23 DIAGNOSIS — N261 Atrophy of kidney (terminal): Secondary | ICD-10-CM | POA: Diagnosis not present

## 2020-08-23 DIAGNOSIS — R4189 Other symptoms and signs involving cognitive functions and awareness: Secondary | ICD-10-CM | POA: Diagnosis not present

## 2020-08-25 DIAGNOSIS — Z87442 Personal history of urinary calculi: Secondary | ICD-10-CM | POA: Diagnosis not present

## 2020-08-25 DIAGNOSIS — I878 Other specified disorders of veins: Secondary | ICD-10-CM | POA: Diagnosis not present

## 2020-08-25 DIAGNOSIS — N200 Calculus of kidney: Secondary | ICD-10-CM | POA: Diagnosis not present

## 2020-08-25 DIAGNOSIS — D7389 Other diseases of spleen: Secondary | ICD-10-CM | POA: Diagnosis not present

## 2020-08-25 DIAGNOSIS — S22080A Wedge compression fracture of T11-T12 vertebra, initial encounter for closed fracture: Secondary | ICD-10-CM | POA: Diagnosis not present

## 2020-08-28 DIAGNOSIS — E7800 Pure hypercholesterolemia, unspecified: Secondary | ICD-10-CM | POA: Diagnosis not present

## 2020-08-28 DIAGNOSIS — I10 Essential (primary) hypertension: Secondary | ICD-10-CM | POA: Diagnosis not present

## 2020-08-28 DIAGNOSIS — E039 Hypothyroidism, unspecified: Secondary | ICD-10-CM | POA: Diagnosis not present

## 2020-09-13 DIAGNOSIS — N200 Calculus of kidney: Secondary | ICD-10-CM | POA: Diagnosis not present

## 2020-09-13 DIAGNOSIS — Z9884 Bariatric surgery status: Secondary | ICD-10-CM | POA: Diagnosis not present

## 2020-09-13 DIAGNOSIS — Z87442 Personal history of urinary calculi: Secondary | ICD-10-CM | POA: Diagnosis not present

## 2020-09-13 DIAGNOSIS — M47816 Spondylosis without myelopathy or radiculopathy, lumbar region: Secondary | ICD-10-CM | POA: Diagnosis not present

## 2020-09-18 ENCOUNTER — Other Ambulatory Visit: Payer: Self-pay | Admitting: Cardiovascular Disease

## 2020-09-27 DIAGNOSIS — R339 Retention of urine, unspecified: Secondary | ICD-10-CM | POA: Diagnosis not present

## 2020-09-27 DIAGNOSIS — N200 Calculus of kidney: Secondary | ICD-10-CM | POA: Diagnosis not present

## 2020-09-27 DIAGNOSIS — N261 Atrophy of kidney (terminal): Secondary | ICD-10-CM | POA: Diagnosis not present

## 2020-09-27 DIAGNOSIS — N1330 Unspecified hydronephrosis: Secondary | ICD-10-CM | POA: Diagnosis not present

## 2020-09-27 DIAGNOSIS — N952 Postmenopausal atrophic vaginitis: Secondary | ICD-10-CM | POA: Diagnosis not present

## 2020-09-27 DIAGNOSIS — N3946 Mixed incontinence: Secondary | ICD-10-CM | POA: Diagnosis not present

## 2020-09-27 DIAGNOSIS — R4189 Other symptoms and signs involving cognitive functions and awareness: Secondary | ICD-10-CM | POA: Diagnosis not present

## 2020-09-28 DIAGNOSIS — N1330 Unspecified hydronephrosis: Secondary | ICD-10-CM | POA: Diagnosis not present

## 2020-09-28 DIAGNOSIS — N200 Calculus of kidney: Secondary | ICD-10-CM | POA: Diagnosis not present

## 2020-09-28 DIAGNOSIS — R4189 Other symptoms and signs involving cognitive functions and awareness: Secondary | ICD-10-CM | POA: Diagnosis not present

## 2020-09-28 DIAGNOSIS — N261 Atrophy of kidney (terminal): Secondary | ICD-10-CM | POA: Diagnosis not present

## 2020-09-28 DIAGNOSIS — N952 Postmenopausal atrophic vaginitis: Secondary | ICD-10-CM | POA: Diagnosis not present

## 2020-09-28 DIAGNOSIS — R339 Retention of urine, unspecified: Secondary | ICD-10-CM | POA: Diagnosis not present

## 2020-09-28 DIAGNOSIS — N3946 Mixed incontinence: Secondary | ICD-10-CM | POA: Diagnosis not present

## 2020-10-10 ENCOUNTER — Other Ambulatory Visit: Payer: Self-pay | Admitting: Cardiovascular Disease

## 2020-10-28 IMAGING — CR DG LUMBAR SPINE COMPLETE 4+V
5 series · 5 of 5 positions shown · non-contrast
Comparison: Correlation made with 06/01/2018 CT abdomen

CLINICAL DATA: Low back pain

EXAM:
LUMBAR SPINE - COMPLETE 4+ VIEW

[t lumbar spine ap]
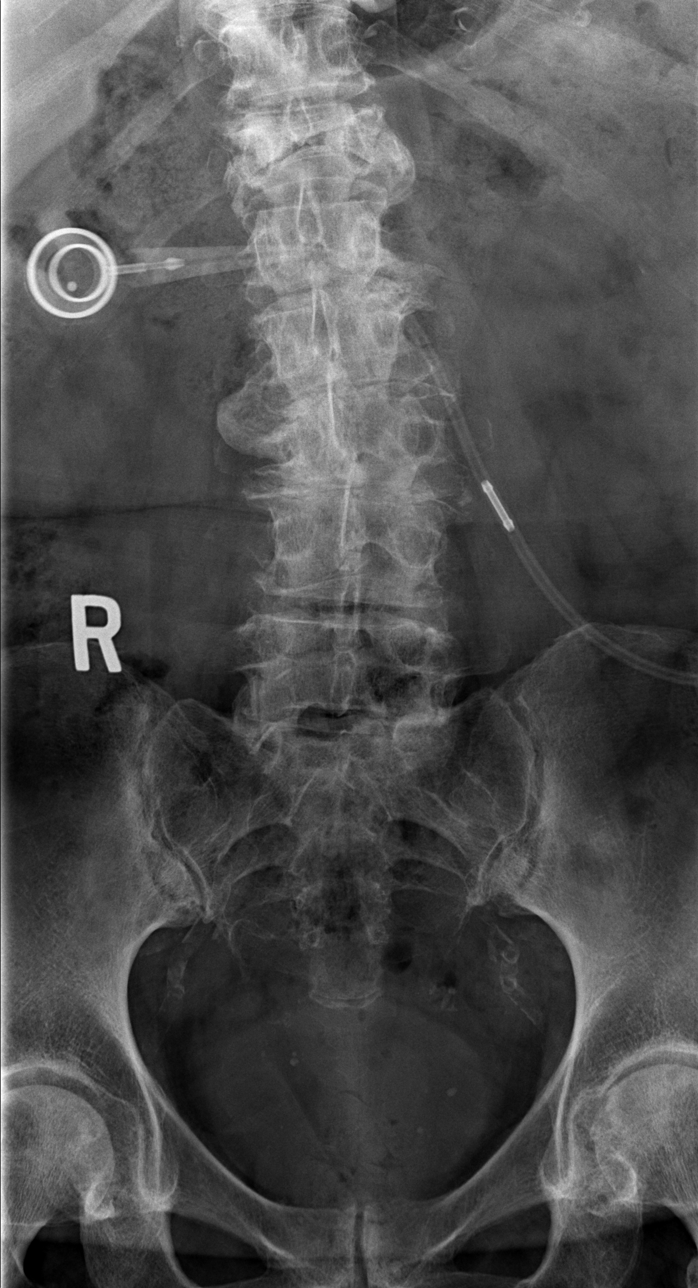

[t lumbar spine obl (1 of 2)]
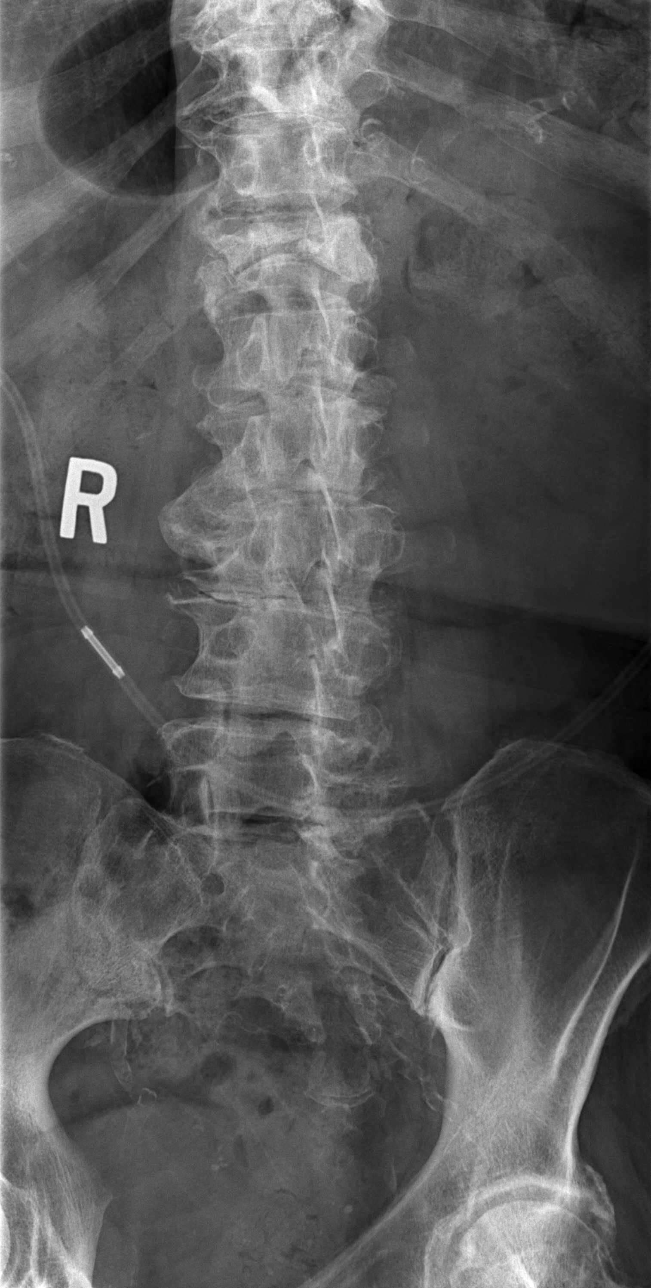

[t lumbar spine obl (2 of 2)]
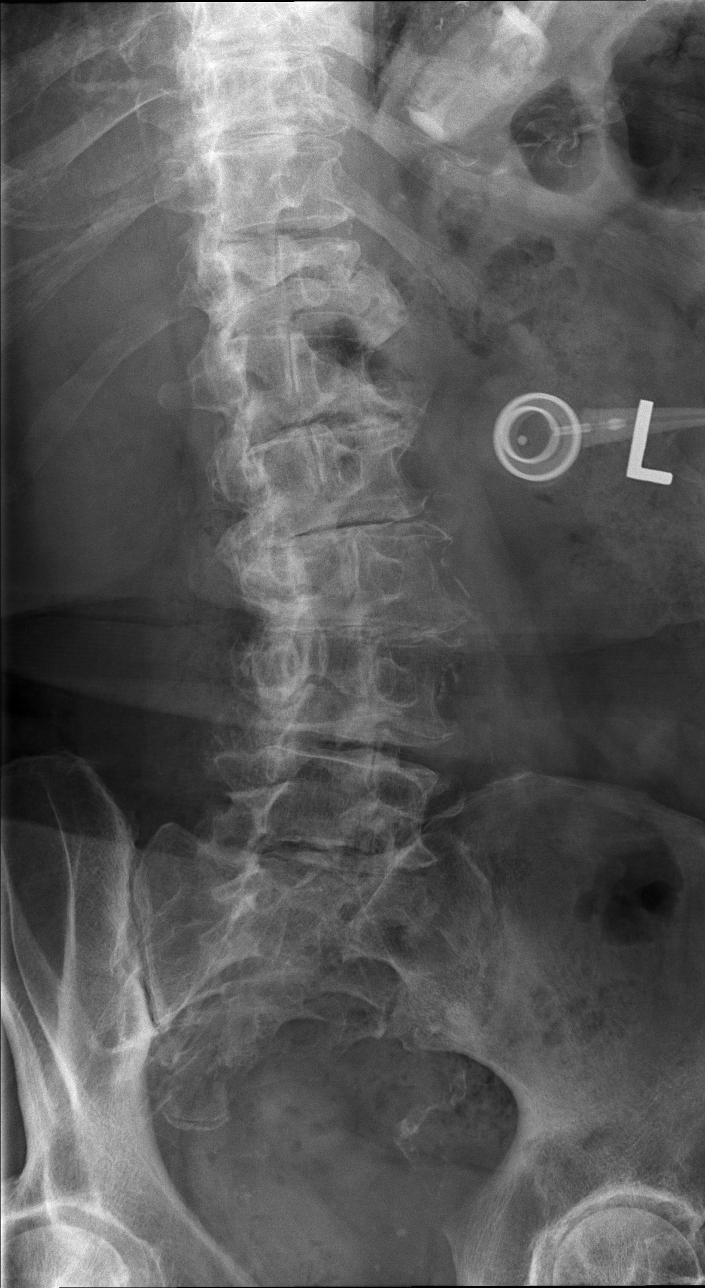

[t lumbar spine lat]
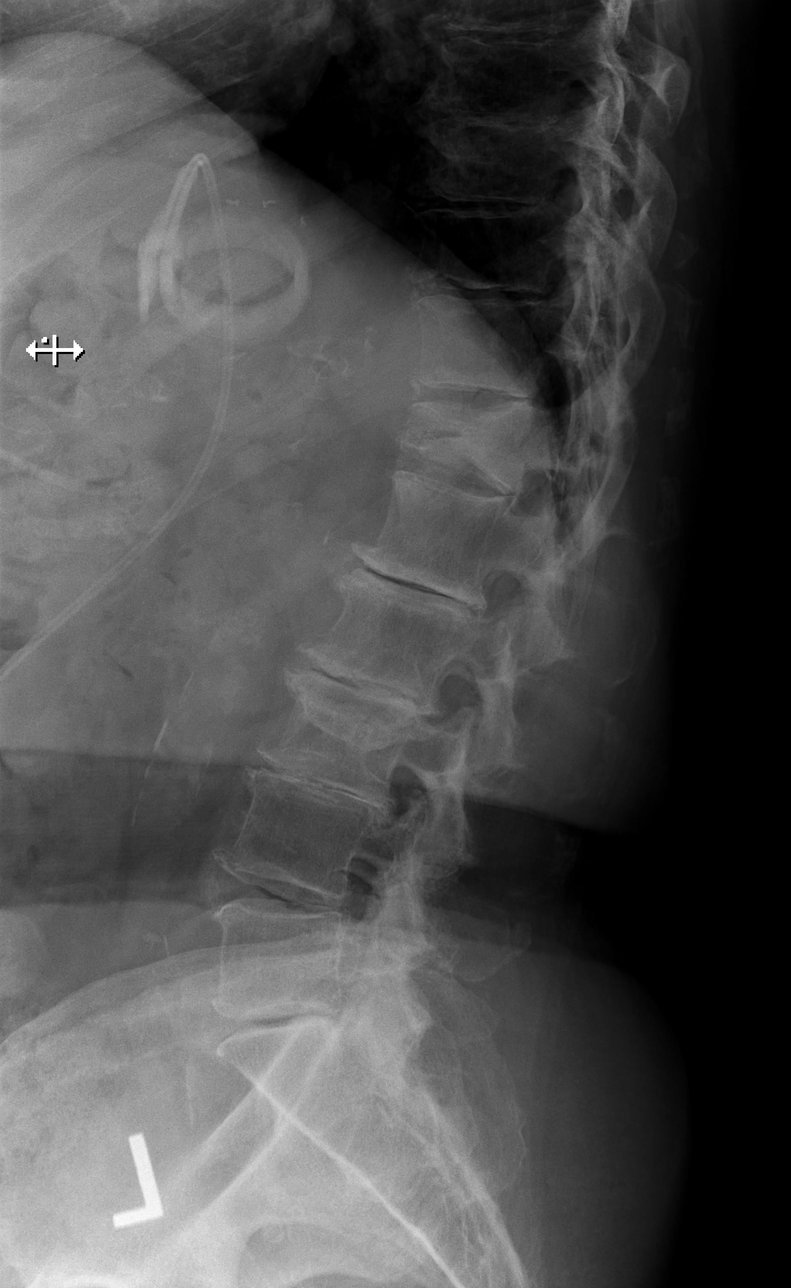

[t lumbar l-5 s-1 spot]
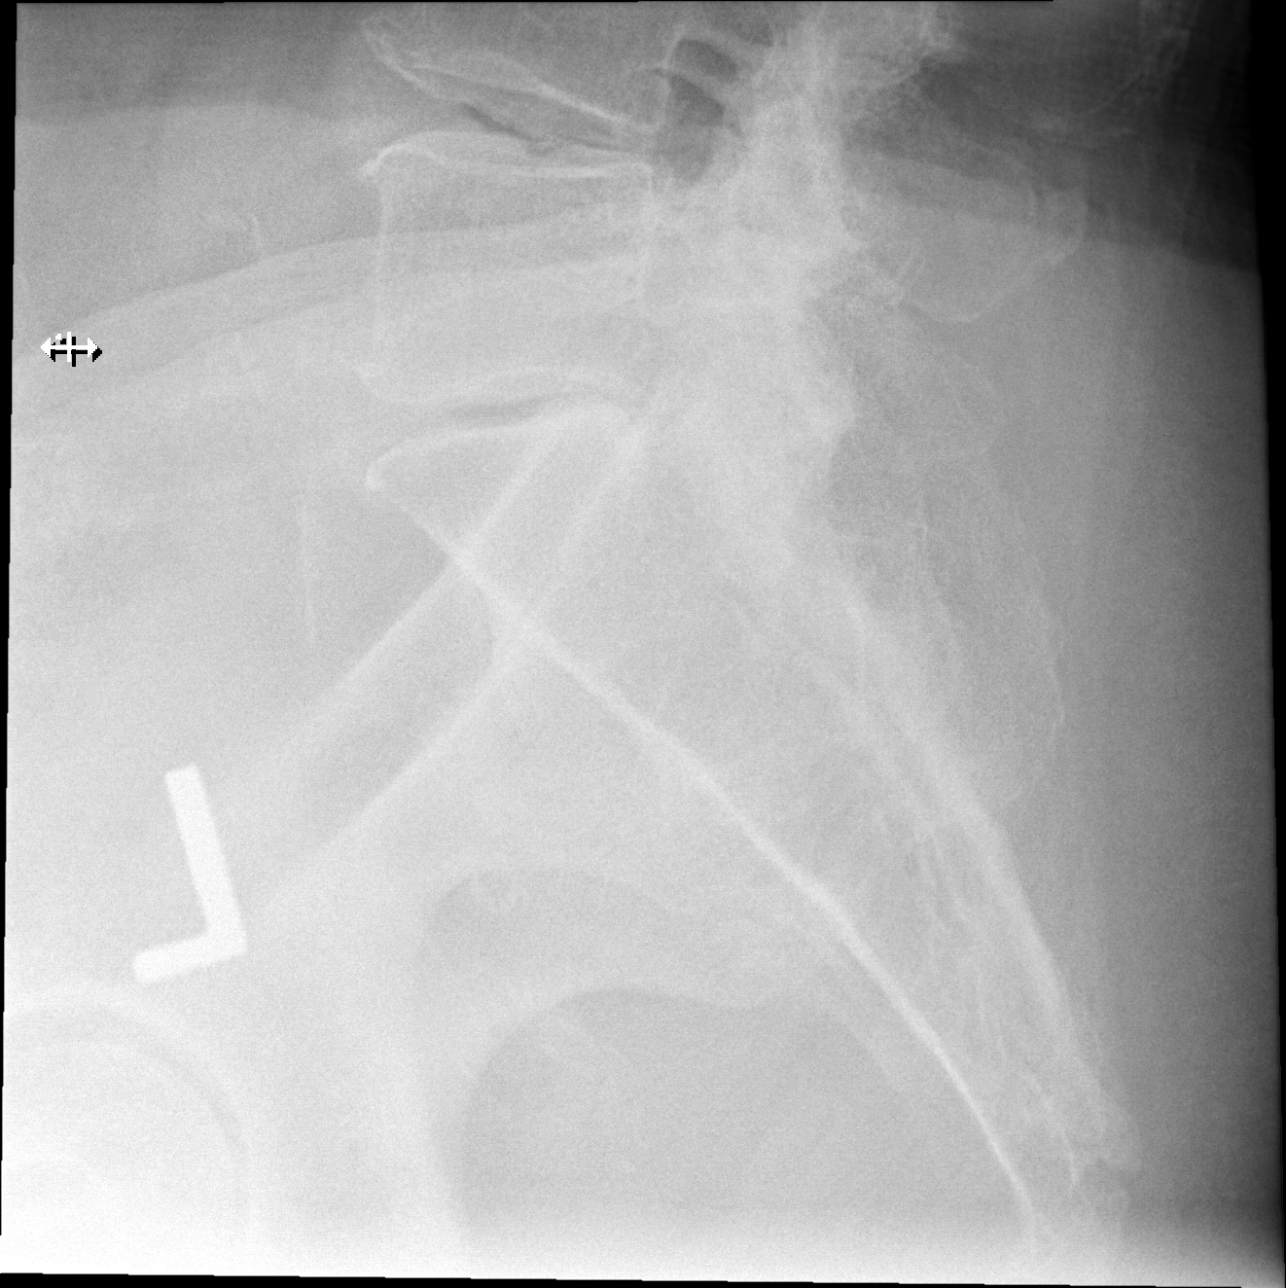

[5 of 5 positions shown; findings below may reference images not displayed]

FINDINGS: Trace degenerative listhesis. Vertebral body heights are maintained
apart from age-indeterminate compression deformity of the T12
vertebral body with moderate anterior wedging. This is new compared
to 06/01/2018 CT abdomen. There is multilevel disc space narrowing,
vacuum disc phenomenon, and endplate osteophytes. Facet hypertrophy
is present.

Laparoscopic gastric band and associated apparatus noted.
IMPRESSION: Age-indeterminate compression deformity of T12, new since
06/01/2018.

Advanced multilevel degenerative changes.

## 2020-11-02 DIAGNOSIS — N200 Calculus of kidney: Secondary | ICD-10-CM | POA: Diagnosis not present

## 2020-11-02 DIAGNOSIS — N952 Postmenopausal atrophic vaginitis: Secondary | ICD-10-CM | POA: Diagnosis not present

## 2020-11-02 DIAGNOSIS — R339 Retention of urine, unspecified: Secondary | ICD-10-CM | POA: Diagnosis not present

## 2020-11-02 DIAGNOSIS — R4189 Other symptoms and signs involving cognitive functions and awareness: Secondary | ICD-10-CM | POA: Diagnosis not present

## 2020-11-02 DIAGNOSIS — N3946 Mixed incontinence: Secondary | ICD-10-CM | POA: Diagnosis not present

## 2020-11-03 DIAGNOSIS — R339 Retention of urine, unspecified: Secondary | ICD-10-CM | POA: Diagnosis not present

## 2020-11-05 ENCOUNTER — Other Ambulatory Visit: Payer: Self-pay | Admitting: Cardiovascular Disease

## 2020-11-13 DIAGNOSIS — S20219A Contusion of unspecified front wall of thorax, initial encounter: Secondary | ICD-10-CM | POA: Diagnosis not present

## 2020-11-13 DIAGNOSIS — W19XXXA Unspecified fall, initial encounter: Secondary | ICD-10-CM | POA: Diagnosis not present

## 2020-11-20 ENCOUNTER — Other Ambulatory Visit: Payer: Self-pay | Admitting: Cardiovascular Disease

## 2020-11-21 DIAGNOSIS — L728 Other follicular cysts of the skin and subcutaneous tissue: Secondary | ICD-10-CM | POA: Diagnosis not present

## 2020-11-21 DIAGNOSIS — L820 Inflamed seborrheic keratosis: Secondary | ICD-10-CM | POA: Diagnosis not present

## 2020-11-27 ENCOUNTER — Ambulatory Visit (INDEPENDENT_AMBULATORY_CARE_PROVIDER_SITE_OTHER): Payer: Medicare HMO

## 2020-11-27 DIAGNOSIS — I639 Cerebral infarction, unspecified: Secondary | ICD-10-CM | POA: Diagnosis not present

## 2020-11-28 ENCOUNTER — Other Ambulatory Visit: Payer: Self-pay | Admitting: Cardiovascular Disease

## 2020-11-28 LAB — CUP PACEART REMOTE DEVICE CHECK
Date Time Interrogation Session: 20220826052449
Implantable Pulse Generator Implant Date: 20191206

## 2020-12-08 NOTE — Progress Notes (Signed)
Carelink Summary Report / Loop Recorder 

## 2020-12-11 DIAGNOSIS — M47817 Spondylosis without myelopathy or radiculopathy, lumbosacral region: Secondary | ICD-10-CM | POA: Diagnosis not present

## 2020-12-11 DIAGNOSIS — Z6832 Body mass index (BMI) 32.0-32.9, adult: Secondary | ICD-10-CM | POA: Diagnosis not present

## 2020-12-11 DIAGNOSIS — K8020 Calculus of gallbladder without cholecystitis without obstruction: Secondary | ICD-10-CM | POA: Diagnosis not present

## 2020-12-15 ENCOUNTER — Other Ambulatory Visit: Payer: Self-pay | Admitting: Cardiovascular Disease

## 2020-12-20 DIAGNOSIS — R110 Nausea: Secondary | ICD-10-CM | POA: Diagnosis not present

## 2020-12-22 DIAGNOSIS — R110 Nausea: Secondary | ICD-10-CM | POA: Diagnosis not present

## 2020-12-22 DIAGNOSIS — R079 Chest pain, unspecified: Secondary | ICD-10-CM | POA: Diagnosis not present

## 2020-12-22 DIAGNOSIS — K219 Gastro-esophageal reflux disease without esophagitis: Secondary | ICD-10-CM | POA: Diagnosis not present

## 2020-12-22 DIAGNOSIS — K449 Diaphragmatic hernia without obstruction or gangrene: Secondary | ICD-10-CM | POA: Diagnosis not present

## 2020-12-25 ENCOUNTER — Other Ambulatory Visit: Payer: Self-pay | Admitting: Cardiovascular Disease

## 2020-12-27 LAB — CUP PACEART REMOTE DEVICE CHECK
Date Time Interrogation Session: 20220926052455
Implantable Pulse Generator Implant Date: 20191206

## 2020-12-28 ENCOUNTER — Ambulatory Visit (INDEPENDENT_AMBULATORY_CARE_PROVIDER_SITE_OTHER): Payer: Medicare HMO

## 2020-12-28 DIAGNOSIS — I639 Cerebral infarction, unspecified: Secondary | ICD-10-CM | POA: Diagnosis not present

## 2021-01-08 NOTE — Progress Notes (Signed)
Carelink Summary Report / Loop Recorder 

## 2021-01-25 LAB — CUP PACEART REMOTE DEVICE CHECK
Date Time Interrogation Session: 20221027052935
Implantable Pulse Generator Implant Date: 20191206

## 2021-01-29 ENCOUNTER — Ambulatory Visit (INDEPENDENT_AMBULATORY_CARE_PROVIDER_SITE_OTHER): Payer: Medicare HMO

## 2021-01-29 DIAGNOSIS — I480 Paroxysmal atrial fibrillation: Secondary | ICD-10-CM | POA: Diagnosis not present

## 2021-02-01 DIAGNOSIS — N200 Calculus of kidney: Secondary | ICD-10-CM | POA: Diagnosis not present

## 2021-02-01 DIAGNOSIS — R339 Retention of urine, unspecified: Secondary | ICD-10-CM | POA: Diagnosis not present

## 2021-02-01 DIAGNOSIS — R4189 Other symptoms and signs involving cognitive functions and awareness: Secondary | ICD-10-CM | POA: Diagnosis not present

## 2021-02-01 DIAGNOSIS — N952 Postmenopausal atrophic vaginitis: Secondary | ICD-10-CM | POA: Diagnosis not present

## 2021-02-01 DIAGNOSIS — N3946 Mixed incontinence: Secondary | ICD-10-CM | POA: Diagnosis not present

## 2021-02-05 NOTE — Progress Notes (Signed)
Carelink Summary Report / Loop Recorder 

## 2021-02-14 ENCOUNTER — Other Ambulatory Visit: Payer: Self-pay

## 2021-02-14 ENCOUNTER — Encounter: Payer: Self-pay | Admitting: Physical Medicine & Rehabilitation

## 2021-02-14 ENCOUNTER — Encounter: Payer: Medicare HMO | Attending: Physical Medicine & Rehabilitation | Admitting: Physical Medicine & Rehabilitation

## 2021-02-14 VITALS — BP 116/79 | HR 71 | Ht 63.0 in | Wt 180.2 lb

## 2021-02-14 DIAGNOSIS — M47816 Spondylosis without myelopathy or radiculopathy, lumbar region: Secondary | ICD-10-CM

## 2021-02-14 DIAGNOSIS — I63411 Cerebral infarction due to embolism of right middle cerebral artery: Secondary | ICD-10-CM | POA: Diagnosis not present

## 2021-02-14 NOTE — Progress Notes (Signed)
Subjective:    Patient ID: Theresa Ray, female    DOB: 11-Dec-1945, 75 y.o.   MRN: [id number]  HPI  Mrs Boss in f/u of her right MCA infarct. I last saw her in May. "I'm still not driving." Her husband still has some concerns about her balance and reaction time.   From a standpoint,  her back is still a problem. It bothered her walking over to the office from her car. The pain is just in her low back. She does some stretches in bed at night.  She is doing fairly well with balance. She walks without assist in the home. She is doing some light house chores.   She is not overly active but is doing more in general.     Pain Inventory Average Pain 9 Pain Right Now 8 My pain is constant and sharp  In the last 24 hours, has pain interfered with the following? General activity 2 Relation with others 0 Enjoyment of life 2 What TIME of day is your pain at its worst? daytime Sleep (in general) Fair  Pain is worse with: walking and standing Pain improves with: rest Relief from Meds: 2  Family History  Problem Relation Age of Onset   Heart disease Mother 27       CABG   CVA Father    Social History   Socioeconomic History   Marital status: Married    Spouse name: Not on file   Number of children: Not on file   Years of education: Not on file   Highest education level: Not on file  Occupational History   Not on file  Tobacco Use   Smoking status: Former    Types: Cigarettes    Start date: 59    Quit date: 24    Years since quitting: 47.9   Smokeless tobacco: Never  Vaping Use   Vaping Use: Never used  Substance and Sexual Activity   Alcohol use: No   Drug use: No   Sexual activity: Not on file  Other Topics Concern   Not on file  Social History Narrative   Not on file   Social Determinants of Health   Financial Resource Strain: Not on file  Food Insecurity: Not on file  Transportation Needs: Not on file  Physical Activity: Not on file  Stress: Not on  file  Social Connections: Not on file   Past Surgical History:  Procedure Laterality Date   ABDOMINAL HYSTERECTOMY     CORONARY ARTERY BYPASS GRAFT N/A 02/24/2017   Procedure: CORONARY ARTERY BYPASS GRAFTING (CABG) USING LEFT INTERNAL MAMMARY ARTERY TO LAD AND ENDOSCOPICALLY HARVESTED GREATER SAPHENOUS VEIN TO PDA AND TO OM1.;  Surgeon: Grace Isaac, MD;  Location: [place];  Service: Open Heart Surgery;  Laterality: N/A;   ENDOVEIN HARVEST OF GREATER SAPHENOUS VEIN Right 02/24/2017   Procedure: ENDOVEIN HARVEST OF GREATER SAPHENOUS VEIN;  Surgeon: Grace Isaac, MD;  Location: [place];  Service: Open Heart Surgery;  Laterality: Right;   LAPAROSCOPIC GASTRIC BANDING     LEFT HEART CATH AND CORONARY ANGIOGRAPHY N/A 02/18/2017   Procedure: LEFT HEART CATH AND CORONARY ANGIOGRAPHY;  Surgeon: Leonie Man, MD;  Location: [place] CV LAB;  Service: Cardiovascular;  Laterality: N/A;   LOOP RECORDER INSERTION N/A 03/06/2018   Procedure: LOOP RECORDER INSERTION;  Surgeon: Constance Haw, MD;  Location: [place] CV LAB;  Service: Cardiovascular;  Laterality: N/A;   TEE WITHOUT CARDIOVERSION N/A 02/24/2017  Procedure: TRANSESOPHAGEAL ECHOCARDIOGRAM (TEE);  Surgeon: Grace Isaac, MD;  Location: [place];  Service: Open Heart Surgery;  Laterality: N/A;   TEE WITHOUT CARDIOVERSION N/A 03/06/2018   Procedure: TRANSESOPHAGEAL ECHOCARDIOGRAM (TEE);  Surgeon: Dorothy Spark, MD;  Location: [facility] ENDOSCOPY;  Service: Cardiovascular;  Laterality: N/A;  loop   Past Surgical History:  Procedure Laterality Date   ABDOMINAL HYSTERECTOMY     CORONARY ARTERY BYPASS GRAFT N/A 02/24/2017   Procedure: CORONARY ARTERY BYPASS GRAFTING (CABG) USING LEFT INTERNAL MAMMARY ARTERY TO LAD AND ENDOSCOPICALLY HARVESTED GREATER SAPHENOUS VEIN TO PDA AND TO OM1.;  Surgeon: Grace Isaac, MD;  Location: [place];  Service: Open Heart Surgery;  Laterality: N/A;   ENDOVEIN HARVEST OF GREATER SAPHENOUS VEIN  Right 02/24/2017   Procedure: ENDOVEIN HARVEST OF GREATER SAPHENOUS VEIN;  Surgeon: Grace Isaac, MD;  Location: [place];  Service: Open Heart Surgery;  Laterality: Right;   LAPAROSCOPIC GASTRIC BANDING     LEFT HEART CATH AND CORONARY ANGIOGRAPHY N/A 02/18/2017   Procedure: LEFT HEART CATH AND CORONARY ANGIOGRAPHY;  Surgeon: Leonie Man, MD;  Location: [place] [facility] LAB;  Service: Cardiovascular;  Laterality: N/A;   LOOP RECORDER INSERTION N/A 03/06/2018   Procedure: LOOP RECORDER INSERTION;  Surgeon: Constance Haw, MD;  Location: [place] CV LAB;  Service: Cardiovascular;  Laterality: N/A;   TEE WITHOUT CARDIOVERSION N/A 02/24/2017   Procedure: TRANSESOPHAGEAL ECHOCARDIOGRAM (TEE);  Surgeon: Grace Isaac, MD;  Location: [place];  Service: Open Heart Surgery;  Laterality: N/A;   TEE WITHOUT CARDIOVERSION N/A 03/06/2018   Procedure: TRANSESOPHAGEAL ECHOCARDIOGRAM (TEE);  Surgeon: Dorothy Spark, MD;  Location: [facility] ENDOSCOPY;  Service: Cardiovascular;  Laterality: N/A;  loop   Past Medical History:  Diagnosis Date   CAD in native artery    a. s/p CABGx3 in 01/2017.   Chronic diastolic CHF (congestive heart failure) ([facility])    Diabetes mellitus ([place])    Diabetic neuropathy ([place])    History of kidney stones    history of stones   Hypertension    Hypothyroidism    NSTEMI (non-ST elevated myocardial infarction) ([place])    Orthostatic hypotension    Postoperative atrial fibrillation ([place])    a. after CABG 01/2017.   TIA (transient ischemic attack)    BP 116/79   Pulse 71   Ht 5\' 3"  (1.6 m)   Wt 180 lb 3.2 oz (81.7 kg)   LMP  (LMP Unknown)   SpO2 98%   BMI 31.92 kg/m   Opioid Risk Score:   Fall Risk Score:  `1  Depression screen PHQ 2/9  Depression screen [facility] 2/9 08/16/2020 02/16/2020 05/06/2018 04/07/2018  Decreased Interest 0 0 3 0  Down, Depressed, Hopeless 0 0 3 0  PHQ - 2 Score 0 0 6 0  Altered sleeping - - 2 1  Tired, decreased energy - - 3 1  Change in  appetite - - 3 0  Feeling bad or failure about yourself  - - 2 0  Trouble concentrating - - 2 1  Moving slowly or fidgety/restless - - 0 1  Suicidal thoughts - - 2 0  PHQ-9 Score - - 20 4  Difficult doing work/chores - - Not difficult at all Not difficult at all     Review of Systems  Musculoskeletal:  Positive for back pain.  All other systems reviewed and are negative.     Objective:   Physical Exam General: No acute distress HEENT: NCAT, EOMI,  oral membranes moist Cards: reg rate  Chest: normal effort Abdomen: Soft, NT, ND Skin: dry, intact Extremities: no edema Psych: pleasant and appropriate   Neuro: IMPROVED language, better context and fluidity. More engaging and automatic . Motor function is grossly 5/5 right upper extremity and left upper extremity.  5/5 in both lower extremities today.  gait stable Musculoskeletal: thead forward. Pain with extension o flow back. Tight hamstrings               Assessment & Plan:  1. 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 Program provided  -maintain activity as possible 2. Hypertension: under control             3.  Type 2 diabetes: Per primary care physician. 4.  History of hydronephrosis with urinary obstruction due to renal calculus:              5. Right rotator cuff injury/tear, new left humerus fx, may have RTC injury there as well             -per ortho, some improvemeent 6. Low back pain: stable           -kyphotic posture, lumbar facet arthropathy           -flexion stretches for back/hamstrings             15 MINUTES of face to face patient care time were spent during this visit. All questions were encouraged and answered.  Follow up with me prn

## 2021-02-14 NOTE — Patient Instructions (Signed)
RETURN TO DRIVING PLAN:  WITH THE SUPERVISION OF A LICENSED DRIVER, PLEASE DRIVE IN AN EMPTY PARKING LOT FOR AT LEAST 2-3 TRIALS TO TEST REACTION TIME, VISION, USE OF EQUIPMENT IN CAR, ETC.  IF SUCCESSFUL WITH THE PARKING LOT DRIVING, PROCEED TO SUPERVISED DRIVING TRIALS IN YOUR NEIGHBORHOOD STREETS AT LOW TRAFFIC TIMES TO TEST OBSERVATION TO TRAFFIC SIGNALS, REACTION TIME, ETC. PLEASE ATTEMPT AT LEAST 2-3 TRIALS IN YOUR NEIGHBORHOOD.  IF NEIGHBORHOOD DRIVING IS SUCCESSFUL, YOU MAY PROCEED TO DRIVING IN BUSIER AREAS IN YOUR COMMUNITY WITH SUPERVISION OF A LICENSED DRIVER. PLEASE ATTEMPT AT LEAST 4-5 TRIALS.  IF COMMUNITY DRIVING IS SUCCESSFUL, YOU MAY PROCEED TO DRIVING ALONE, DURING THE DAY TIME, IN NON-PEAK TRAFFIC TIMES. YOU SHOULD DRIVE NO FURTHER THAN 15 MINUTES IN ONE DIRECTION. PLEASE DO NOT DRIVE IF YOU FEEL FATIGUED OR UNDER THE INFLUENCE OF MEDICATION.   

## 2021-03-01 ENCOUNTER — Ambulatory Visit (HOSPITAL_COMMUNITY): Payer: Medicare HMO | Admitting: Nurse Practitioner

## 2021-03-05 ENCOUNTER — Other Ambulatory Visit: Payer: Self-pay

## 2021-03-05 ENCOUNTER — Encounter (HOSPITAL_COMMUNITY): Payer: Self-pay | Admitting: Physician Assistant

## 2021-03-05 ENCOUNTER — Ambulatory Visit (HOSPITAL_COMMUNITY)
Admission: RE | Admit: 2021-03-05 | Discharge: 2021-03-05 | Disposition: A | Discharge status: Home / Self Care | Payer: Medicare HMO | Source: Ambulatory Visit | Attending: Nurse Practitioner | Admitting: Nurse Practitioner

## 2021-03-05 VITALS — BP 118/72 | HR 70 | Ht 63.0 in | Wt 182.4 lb

## 2021-03-05 DIAGNOSIS — E669 Obesity, unspecified: Secondary | ICD-10-CM | POA: Insufficient documentation

## 2021-03-05 DIAGNOSIS — I110 Hypertensive heart disease with heart failure: Secondary | ICD-10-CM | POA: Insufficient documentation

## 2021-03-05 DIAGNOSIS — Z6832 Body mass index (BMI) 32.0-32.9, adult: Secondary | ICD-10-CM | POA: Insufficient documentation

## 2021-03-05 DIAGNOSIS — I2510 Atherosclerotic heart disease of native coronary artery without angina pectoris: Secondary | ICD-10-CM | POA: Diagnosis not present

## 2021-03-05 DIAGNOSIS — Z79899 Other long term (current) drug therapy: Secondary | ICD-10-CM | POA: Insufficient documentation

## 2021-03-05 DIAGNOSIS — I5032 Chronic diastolic (congestive) heart failure: Secondary | ICD-10-CM | POA: Insufficient documentation

## 2021-03-05 DIAGNOSIS — R0681 Apnea, not elsewhere classified: Secondary | ICD-10-CM | POA: Diagnosis not present

## 2021-03-05 DIAGNOSIS — Z951 Presence of aortocoronary bypass graft: Secondary | ICD-10-CM | POA: Insufficient documentation

## 2021-03-05 DIAGNOSIS — G4733 Obstructive sleep apnea (adult) (pediatric): Secondary | ICD-10-CM | POA: Diagnosis not present

## 2021-03-05 DIAGNOSIS — Z87891 Personal history of nicotine dependence: Secondary | ICD-10-CM | POA: Diagnosis not present

## 2021-03-05 DIAGNOSIS — I480 Paroxysmal atrial fibrillation: Secondary | ICD-10-CM | POA: Diagnosis not present

## 2021-03-05 DIAGNOSIS — Z8673 Personal history of transient ischemic attack (TIA), and cerebral infarction without residual deficits: Secondary | ICD-10-CM | POA: Insufficient documentation

## 2021-03-05 DIAGNOSIS — D6869 Other thrombophilia: Secondary | ICD-10-CM | POA: Diagnosis not present

## 2021-03-05 LAB — BASIC METABOLIC PANEL
Anion gap: 8 (ref 5–15)
BUN: 22 mg/dL (ref 8–23)
CO2: 25 mmol/L (ref 22–32)
Calcium: 9 mg/dL (ref 8.9–10.3)
Chloride: 105 mmol/L (ref 98–111)
Creatinine, Ser: 1.46 mg/dL — ABNORMAL HIGH (ref 0.44–1.00)
GFR, Estimated: 38 mL/min — ABNORMAL LOW (ref >60)
Glucose, Bld: 109 mg/dL — ABNORMAL HIGH (ref 70–99)
Potassium: 4.2 mmol/L (ref 3.5–5.1)
Sodium: 138 mmol/L (ref 135–145)

## 2021-03-05 LAB — CBC
HCT: 39.3 % (ref 36.0–46.0)
Hemoglobin: 12.8 g/dL (ref 12.0–15.0)
MCH: 30.3 pg (ref 26.0–34.0)
MCHC: 32.6 g/dL (ref 30.0–36.0)
MCV: 92.9 fL (ref 80.0–100.0)
Platelets: 359 10*3/uL (ref 150–400)
RBC: 4.23 MIL/uL (ref 3.87–5.11)
RDW: 12.1 % (ref 11.5–15.5)
WBC: 7.4 10*3/uL (ref 4.0–10.5)
nRBC: 0 % (ref 0.0–0.2)

## 2021-03-05 MED ORDER — APIXABAN 5 MG PO TABS: 5.0000 mg | ORAL_TABLET | Freq: Two times a day (BID) | ORAL | 3 refills | Status: DC

## 2021-03-05 NOTE — Patient Instructions (Signed)
STOP plavix  Start Aspirin 81mg  once a day  Start Eliquis 5mg  twice a day  Follow up with Dr. Curt Bears in [place] - their office will call to schedule.

## 2021-03-05 NOTE — Progress Notes (Signed)
Primary Care Physician: Ronita Hipps, MD Primary Cardiologist: Dr Angelena Form Primary Electrophysiologist: Dr Curt Bears  Referring Physician: Device clinic/Dr Curt Bears   Theresa Ray is a 75 y.o. female with a history of CAD s/p CABG, OSA, chronic diastolic CHF, DM, HTN, CVA, atrial fibrillation who presents for follow up in the [facility].  The patient was initially diagnosed with atrial fibrillation post operatively after her CABG. She had no further episodes on event monitor. She had a CVA in 2019 and an ILR was placed. Device interrogation 12/25/20 showed new episodes of afib. Patient has a CHADS2VASC score of 8. Patient was unaware of her arrhythmia. She does have a history of OSA but has not been on CPAP for many years. Family reports she snores loudly with apneic episodes. No alcohol use.   Today, she denies symptoms of palpitations, chest pain, shortness of breath, orthopnea, PND, lower extremity edema, dizziness, presyncope, syncope, bleeding, or neurologic sequela. The patient is tolerating medications without difficulties and is otherwise without complaint today.    Atrial Fibrillation Risk Factors:  she does have symptoms or diagnosis of sleep apnea. she is not compliant with CPAP therapy. she does not have a history of rheumatic fever. she does not have a history of alcohol use. The patient does not have a history of early familial atrial fibrillation or other arrhythmias.  she has a BMI of Body mass index is 32.31 kg/m.Marland Kitchen Filed Weights   03/05/21 1409  Weight: 82.7 kg    Family History  Problem Relation Age of Onset   Heart disease Mother 70       CABG   CVA Father      Atrial Fibrillation Management history:  Previous antiarrhythmic drugs: none Previous cardioversions: none Previous ablations: none CHADS2VASC score: 8 Anticoagulation history: none   Past Medical History:  Diagnosis Date   CAD in native artery    a. s/p CABGx3  in 01/2017.   Chronic diastolic CHF (congestive heart failure) (HCC)    Diabetes mellitus ([place])    Diabetic neuropathy ([place])    History of kidney stones    history of stones   Hypertension    Hypothyroidism    NSTEMI (non-ST elevated myocardial infarction) ([place])    Orthostatic hypotension    Postoperative atrial fibrillation ([place])    a. after CABG 01/2017.   TIA (transient ischemic attack)    Past Surgical History:  Procedure Laterality Date   ABDOMINAL HYSTERECTOMY     CORONARY ARTERY BYPASS GRAFT N/A 02/24/2017   Procedure: CORONARY ARTERY BYPASS GRAFTING (CABG) USING LEFT INTERNAL MAMMARY ARTERY TO LAD AND ENDOSCOPICALLY HARVESTED GREATER SAPHENOUS VEIN TO PDA AND TO OM1.;  Surgeon: Grace Isaac, MD;  Location: [facility];  Service: Open Heart Surgery;  Laterality: N/A;   ENDOVEIN HARVEST OF GREATER SAPHENOUS VEIN Right 02/24/2017   Procedure: ENDOVEIN HARVEST OF GREATER SAPHENOUS VEIN;  Surgeon: Grace Isaac, MD;  Location: [facility];  Service: Open Heart Surgery;  Laterality: Right;   LAPAROSCOPIC GASTRIC BANDING     LEFT HEART CATH AND CORONARY ANGIOGRAPHY N/A 02/18/2017   Procedure: LEFT HEART CATH AND CORONARY ANGIOGRAPHY;  Surgeon: Leonie Man, MD;  Location: [place] CV [facility];  Service: Cardiovascular;  Laterality: N/A;   LOOP RECORDER INSERTION N/A 03/06/2018   Procedure: LOOP RECORDER INSERTION;  Surgeon: Constance Haw, MD;  Location: [place] [facility];  Service: Cardiovascular;  Laterality: N/A;   TEE WITHOUT CARDIOVERSION N/A 02/24/2017  Procedure: TRANSESOPHAGEAL ECHOCARDIOGRAM (TEE);  Surgeon: Grace Isaac, MD;  Location: [facility];  Service: Open Heart Surgery;  Laterality: N/A;   TEE WITHOUT CARDIOVERSION N/A 03/06/2018   Procedure: TRANSESOPHAGEAL ECHOCARDIOGRAM (TEE);  Surgeon: Dorothy Spark, MD;  Location: [facility] ENDOSCOPY;  Service: Cardiovascular;  Laterality: N/A;  loop    Current Outpatient Medications  Medication Sig Dispense Refill    acetaminophen (TYLENOL) 325 MG tablet Take 2 tablets (650 mg total) by mouth every 6 (six) hours as needed for mild pain.     amLODipine (NORVASC) 10 MG tablet TAKE 1 TABLET BY MOUTH DAILY. CALL OFFICE AT [phone] TO SCHEDULE APPT FOR MORE REFILLS 15 tablet 0   apixaban (ELIQUIS) 5 MG TABS tablet Take 1 tablet (5 mg total) by mouth 2 (two) times daily. 60 tablet 3   aspirin 81 MG EC tablet Take by mouth.     atorvastatin (LIPITOR) 40 MG tablet Take 1 tablet (40 mg total) by mouth daily. 90 tablet 3   Baclofen 5 MG TABS baclofen 5 mg tablet  1 2 TABLET EVERY 8 HOURS AS NEEDED FOR SPASMS     bethanechol (URECHOLINE) 25 MG tablet Take 25 mg by mouth 2 (two) times daily.     Carboxymethylcellul-Glycerin 1-0.25 % SOLN Place 1 drop into both eyes as needed.     carvedilol (COREG) 12.5 MG tablet Take 1 tablet (12.5 mg total) by mouth 2 (two) times daily. 60 tablet 11   cholecalciferol (VITAMIN D) 1000 units tablet Take 1,000 Units by mouth daily.      citalopram (CELEXA) 10 MG tablet Take 1 tablet (10 mg total) by mouth daily. 30 tablet 0   clotrimazole-betamethasone (LOTRISONE) cream clotrimazole-betamethasone 1 %-0.05 % topical cream     co-enzyme Q-10 30 MG capsule Take by mouth.     cyclobenzaprine (FLEXERIL) 5 MG tablet Take 1 tablet (5 mg total) by mouth 3 (three) times daily as needed for muscle spasms. 30 tablet 2   glipiZIDE (GLUCOTROL) 10 MG tablet 5 mg.     glipiZIDE (GLUCOTROL) 5 MG tablet Take 1 tablet (5 mg total) by mouth 2 (two) times daily before a meal. 60 tablet 1   Glucosamine-Chondroit-Vit C-Mn (GLUCOSAMINE CHONDR 500 COMPLEX PO) Take 1 tablet 2 (two) times daily by mouth.     Krill Oil 1000 MG CAPS Take 1,000 mg daily by mouth.     levothyroxine (SYNTHROID) 100 MCG tablet Take 100 mcg by mouth daily.     lisinopril (PRINIVIL,ZESTRIL) 5 MG tablet Take 1 tablet (5 mg total) by mouth daily. 30 tablet 3   meclizine (ANTIVERT) 25 MG tablet Take 1 tablet (25 mg total) by mouth 3  (three) times daily as needed for dizziness. 30 tablet 1   metFORMIN (GLUCOPHAGE) 500 MG tablet Take 1 tablet (500 mg total) by mouth 2 (two) times daily with a meal. 60 tablet 1   methocarbamol (ROBAXIN) 500 MG tablet Take by mouth.     Multiple Vitamin (MULTIVITAMIN WITH MINERALS) TABS tablet Take 1 tablet daily by mouth.     ondansetron (ZOFRAN) 8 MG tablet ondansetron HCl 8 mg tablet     oxyCODONE (OXY IR/ROXICODONE) 5 MG immediate release tablet oxycodone 5 mg tablet     phenazopyridine (PYRIDIUM) 100 MG tablet phenazopyridine 100 mg tablet  TAKE 1 TABLET BY MOUTH 3 TIMES A DAY     tamsulosin (FLOMAX) 0.4 MG CAPS capsule Take 0.4 mg by mouth daily.     traMADol (ULTRAM) 50 MG tablet  traMADol-acetaminophen (ULTRACET) 37.5-325 MG tablet Take 1 tablet by mouth every 6 (six) hours as needed.     valACYclovir (VALTREX) 1000 MG tablet valacyclovir 1 gram tablet     No current facility-administered medications for this encounter.    Allergies  Allergen Reactions   Sulfa Antibiotics Nausea And Vomiting   Amoxicillin-Pot Clavulanate     Other reaction(s): Other (See Comments) Pt reports caused a metallic taste   Shellfish Allergy Other (See Comments)    UNKNOWN REACTION.  Allergy found on ALLERGY TEST.   Aggrenox [Aspirin-Dipyridamole Er] Other (See Comments)    Redness in eyes, takes aspirin 325 at home at baseline    Social History   Socioeconomic History   Marital status: Married    Spouse name: Not on file   Number of children: Not on file   Years of education: Not on file   Highest education level: Not on file  Occupational History   Not on file  Tobacco Use   Smoking status: Former    Types: Cigarettes    Start date: 40    Quit date: 1975    Years since quitting: 47.9   Smokeless tobacco: Never   Tobacco comments:    Former smoker 03/05/2021  Vaping Use   Vaping Use: Never used  Substance and Sexual Activity   Alcohol use: No   Drug use: No   Sexual  activity: Not on file  Other Topics Concern   Not on file  Social History Narrative   Not on file   Social Determinants of Health   Financial Resource Strain: Not on file  Food Insecurity: Not on file  Transportation Needs: Not on file  Physical Activity: Not on file  Stress: Not on file  Social Connections: Not on file  Intimate Partner Violence: Not on file     ROS- All systems are reviewed and negative except as per the HPI above.  Physical Exam: Vitals:   03/05/21 1409  BP: 118/72  Pulse: 70  Weight: 82.7 kg  Height: 5\' 3"  (1.6 m)    GEN- The patient is a well appearing obese female, alert and oriented x 3 today.   Head- normocephalic, atraumatic Eyes-  Sclera clear, conjunctiva pink Ears- hearing intact Oropharynx- clear Neck- supple  Lungs- Clear to ausculation bilaterally, normal work of breathing Heart- Regular rate and rhythm, no murmurs, rubs or gallops  GI- soft, NT, ND, + BS Extremities- no clubbing, cyanosis, or edema MS- no significant deformity or atrophy Skin- no rash or lesion Psych- euthymic mood, full affect Neuro- strength and sensation are intact  Wt Readings from Last 3 Encounters:  03/05/21 82.7 kg  02/14/21 81.7 kg  08/16/20 82.6 kg    EKG today demonstrates  SR Vent. rate 70 BPM PR interval * ms QRS duration 82 ms QT/QTcB 376/406 ms  Echo 03/03/18 demonstrated  - Procedure narrative: Transthoracic echocardiography. Image    quality was adequate. The study was technically difficult.  - Left ventricle: The cavity size was normal. Wall thickness was    increased in a pattern of mild LVH. Systolic function was normal.    The estimated ejection fraction was in the range of 50% to 55%.    There is hypokinesis of the inferolateral and inferior    myocardium. The study is not technically sufficient to allow    evaluation of LV diastolic function.  - Mitral valve: There was mild to moderate regurgitation.  - Left atrium: The atrium was  mildly  dilated.   Impressions:   - Hypokinesis of the inferior/inferolateral walls with overall low    normal LV systolic function; mild LVH; mild to moderate MR; mild    LAE.   Epic records are reviewed at length today  CHA2DS2-VASc Score = 8  The patient's score is based upon: CHF History: 1 HTN History: 1 Diabetes History: 1 Stroke History: 2 Vascular Disease History: 1 Age Score: 1 Gender Score: 1      ASSESSMENT AND PLAN: 1. Paroxysmal Atrial Fibrillation (ICD10:  I48.0) The patient's CHA2DS2-VASc score is 8, indicating a 10.8% annual risk of stroke.   General education about afib provided and questions answered. We also discussed her stroke risk and the risks and benefits of anticoagulation. D/w Dr Angelena Form, will stop Plavix and continue ASA 81 mg. Start Eliquis 5 mg BID. Check bmet/cbc Continue carvedilol 12.5 mg BID Continue to follow burden on ILR  2. Secondary Hypercoagulable State (ICD10:  D68.69) The patient is at significant risk for stroke/thromboembolism based upon her CHA2DS2-VASc Score of 8.  Start Apixaban (Eliquis).   3. Obesity Body mass index is 32.31 kg/m. Lifestyle modification was discussed at length including regular exercise and weight reduction.  4. Snoring/Obstructive sleep apnea The importance of adequate treatment of sleep apnea was discussed today in order to improve our ability to maintain sinus rhythm long term. Will refer for sleep study to resume CPAP.  5. HTN Stable, no changes today.  6. CAD S/p CABG No anginal symptoms.   Patient prefers follow up in the [place] office. Will request f/u with Dr Curt Bears there.    [facility] [address] [place], [place] [address] [phone] 03/05/2021 4:03 PM

## 2021-03-07 ENCOUNTER — Telehealth: Payer: Self-pay | Admitting: *Deleted

## 2021-03-07 NOTE — Telephone Encounter (Signed)
-----   Message from Juluis Mire, RN sent at 03/05/2021  3:22 PM EST ----- Regarding: sleep study Pt needs sleep study for witnessed apnea, snoring afib per clint fenton Thanks Stacy

## 2021-03-07 NOTE — Telephone Encounter (Signed)
Prior Authorization for split night sleep study sent to [facility] via web portal. Tracking Number [id number].

## 2021-03-13 DIAGNOSIS — Z23 Encounter for immunization: Secondary | ICD-10-CM | POA: Diagnosis not present

## 2021-03-16 NOTE — Telephone Encounter (Signed)
Received Authorization from [facility] for sleep study. Appointment scheduled for January 26. Patient notified. Auth # [id number]. Valid dates 03/09/21 to 09/05/21.

## 2021-03-22 ENCOUNTER — Ambulatory Visit (HOSPITAL_COMMUNITY)
Admission: RE | Admit: 2021-03-22 | Discharge: 2021-03-22 | Disposition: A | Discharge status: Home / Self Care | Payer: Medicare HMO | Source: Ambulatory Visit | Attending: Nurse Practitioner | Admitting: Nurse Practitioner

## 2021-03-22 ENCOUNTER — Other Ambulatory Visit: Payer: Self-pay

## 2021-03-22 ENCOUNTER — Encounter (HOSPITAL_COMMUNITY): Payer: Self-pay | Admitting: Nurse Practitioner

## 2021-03-22 ENCOUNTER — Telehealth: Payer: Self-pay

## 2021-03-22 VITALS — BP 108/60 | HR 70 | Ht 63.0 in | Wt 183.4 lb

## 2021-03-22 DIAGNOSIS — I480 Paroxysmal atrial fibrillation: Secondary | ICD-10-CM | POA: Diagnosis not present

## 2021-03-22 DIAGNOSIS — I4892 Unspecified atrial flutter: Secondary | ICD-10-CM | POA: Diagnosis not present

## 2021-03-22 DIAGNOSIS — Z951 Presence of aortocoronary bypass graft: Secondary | ICD-10-CM | POA: Insufficient documentation

## 2021-03-22 DIAGNOSIS — D6869 Other thrombophilia: Secondary | ICD-10-CM | POA: Insufficient documentation

## 2021-03-22 DIAGNOSIS — R0683 Snoring: Secondary | ICD-10-CM | POA: Insufficient documentation

## 2021-03-22 DIAGNOSIS — Z91199 Patient's noncompliance with other medical treatment and regimen due to unspecified reason: Secondary | ICD-10-CM | POA: Diagnosis not present

## 2021-03-22 DIAGNOSIS — E118 Type 2 diabetes mellitus with unspecified complications: Secondary | ICD-10-CM | POA: Diagnosis not present

## 2021-03-22 DIAGNOSIS — I2510 Atherosclerotic heart disease of native coronary artery without angina pectoris: Secondary | ICD-10-CM | POA: Insufficient documentation

## 2021-03-22 DIAGNOSIS — I110 Hypertensive heart disease with heart failure: Secondary | ICD-10-CM | POA: Insufficient documentation

## 2021-03-22 DIAGNOSIS — Z87891 Personal history of nicotine dependence: Secondary | ICD-10-CM | POA: Insufficient documentation

## 2021-03-22 DIAGNOSIS — R42 Dizziness and giddiness: Secondary | ICD-10-CM

## 2021-03-22 DIAGNOSIS — Z8673 Personal history of transient ischemic attack (TIA), and cerebral infarction without residual deficits: Secondary | ICD-10-CM | POA: Diagnosis not present

## 2021-03-22 DIAGNOSIS — E669 Obesity, unspecified: Secondary | ICD-10-CM | POA: Diagnosis not present

## 2021-03-22 DIAGNOSIS — I5032 Chronic diastolic (congestive) heart failure: Secondary | ICD-10-CM | POA: Insufficient documentation

## 2021-03-22 DIAGNOSIS — Z7901 Long term (current) use of anticoagulants: Secondary | ICD-10-CM | POA: Diagnosis not present

## 2021-03-22 LAB — CBC
HCT: 33.4 % — ABNORMAL LOW (ref 36.0–46.0)
Hemoglobin: 11.4 g/dL — ABNORMAL LOW (ref 12.0–15.0)
MCH: 31 pg (ref 26.0–34.0)
MCHC: 34.1 g/dL (ref 30.0–36.0)
MCV: 90.8 fL (ref 80.0–100.0)
Platelets: 516 10*3/uL — ABNORMAL HIGH (ref 150–400)
RBC: 3.68 MIL/uL — ABNORMAL LOW (ref 3.87–5.11)
RDW: 11.9 % (ref 11.5–15.5)
WBC: 11.2 10*3/uL — ABNORMAL HIGH (ref 4.0–10.5)
nRBC: 0 % (ref 0.0–0.2)

## 2021-03-22 NOTE — Progress Notes (Addendum)
Primary Care Physician: Ronita Hipps, MD Primary Cardiologist: Dr Angelena Form Primary Electrophysiologist: Dr Curt Bears  Referring Physician: Device clinic/Dr Curt Bears   Theresa Ray is a 75 y.o. female with a history of CAD s/p CABG, OSA, chronic diastolic CHF, DM, HTN, CVA, atrial fibrillation who presents for follow up in the [facility].  The patient was initially diagnosed with atrial fibrillation post operatively after her CABG. She had no further episodes on event monitor. She had a CVA in 2019 and an ILR was placed. Device interrogation 12/25/20 showed new episodes of afib. Patient has a CHADS2VASC score of 8. Patient was unaware of her arrhythmia. She does have a history of OSA but has not been on CPAP for many years. Family reports she snores loudly with apneic episodes. No alcohol use.   She is in the afib clinic, 03/22/21. She is in the clinic for c/o of dizziness that she feels secondary to being started on eliquis. Dizziness started  hours after first dose of eliquis. She did not take eliquis last night or this am and still feels some diziness this am. Notices it more so with walking. She  appears  to be in SR with artifact , rate controlled. She has not had any  reports of her device since end of October, so I can not tell frequency or duration of episodes. She feels she has some days she feels better. Her  husband  will check with device clinic to trouble shoot transmissions.   Today, she denies symptoms of palpitations, chest pain, shortness of breath, orthopnea, PND, lower extremity edema, dizziness, presyncope, syncope, bleeding, or neurologic sequela. The patient is tolerating medications without difficulties and is otherwise without complaint today.    Atrial Fibrillation Risk Factors:  she does have symptoms or diagnosis of sleep apnea. she is not compliant with CPAP therapy. she does not have a history of rheumatic fever. she does not have a  history of alcohol use. The patient does not have a history of early familial atrial fibrillation or other arrhythmias.  she has a BMI of There is no height or weight on file to calculate BMI.. There were no vitals filed for this visit.   Family History  Problem Relation Age of Onset   Heart disease Mother 109       CABG   CVA Father      Atrial Fibrillation Management history:  Previous antiarrhythmic drugs: none Previous cardioversions: none Previous ablations: none CHADS2VASC score: 8 Anticoagulation history: none   Past Medical History:  Diagnosis Date   CAD in native artery    a. s/p CABGx3 in 01/2017.   Chronic diastolic CHF (congestive heart failure) (HCC)    Diabetes mellitus ([place])    Diabetic neuropathy ([place])    History of kidney stones    history of stones   Hypertension    Hypothyroidism    NSTEMI (non-ST elevated myocardial infarction) ([place])    Orthostatic hypotension    Postoperative atrial fibrillation ([place])    a. after CABG 01/2017.   TIA (transient ischemic attack)    Past Surgical History:  Procedure Laterality Date   ABDOMINAL HYSTERECTOMY     CORONARY ARTERY BYPASS GRAFT N/A 02/24/2017   Procedure: CORONARY ARTERY BYPASS GRAFTING (CABG) USING LEFT INTERNAL MAMMARY ARTERY TO LAD AND ENDOSCOPICALLY HARVESTED GREATER SAPHENOUS VEIN TO PDA AND TO OM1.;  Surgeon: Grace Isaac, MD;  Location: [facility];  Service: Open Heart Surgery;  Laterality: N/A;  ENDOVEIN HARVEST OF GREATER SAPHENOUS VEIN Right 02/24/2017   Procedure: ENDOVEIN HARVEST OF GREATER SAPHENOUS VEIN;  Surgeon: Grace Isaac, MD;  Location: [facility];  Service: Open Heart Surgery;  Laterality: Right;   LAPAROSCOPIC GASTRIC BANDING     LEFT HEART CATH AND CORONARY ANGIOGRAPHY N/A 02/18/2017   Procedure: LEFT HEART CATH AND CORONARY ANGIOGRAPHY;  Surgeon: Leonie Man, MD;  Location: [facility];  Service: Cardiovascular;  Laterality: N/A;   LOOP RECORDER INSERTION N/A  03/06/2018   Procedure: LOOP RECORDER INSERTION;  Surgeon: Constance Haw, MD;  Location: [facility];  Service: Cardiovascular;  Laterality: N/A;   TEE WITHOUT CARDIOVERSION N/A 02/24/2017   Procedure: TRANSESOPHAGEAL ECHOCARDIOGRAM (TEE);  Surgeon: Grace Isaac, MD;  Location: [facility];  Service: Open Heart Surgery;  Laterality: N/A;   TEE WITHOUT CARDIOVERSION N/A 03/06/2018   Procedure: TRANSESOPHAGEAL ECHOCARDIOGRAM (TEE);  Surgeon: Dorothy Spark, MD;  Location: [facility];  Service: Cardiovascular;  Laterality: N/A;  loop    Current Outpatient Medications  Medication Sig Dispense Refill   acetaminophen (TYLENOL) 325 MG tablet Take 2 tablets (650 mg total) by mouth every 6 (six) hours as needed for mild pain.     amLODipine (NORVASC) 10 MG tablet TAKE 1 TABLET BY MOUTH DAILY. CALL OFFICE AT [phone] TO SCHEDULE APPT FOR MORE REFILLS 15 tablet 0   apixaban (ELIQUIS) 5 MG TABS tablet Take 1 tablet (5 mg total) by mouth 2 (two) times daily. 60 tablet 3   aspirin 81 MG EC tablet Take by mouth.     atorvastatin (LIPITOR) 40 MG tablet Take 1 tablet (40 mg total) by mouth daily. 90 tablet 3   Baclofen 5 MG TABS baclofen 5 mg tablet  1 2 TABLET EVERY 8 HOURS AS NEEDED FOR SPASMS     bethanechol (URECHOLINE) 25 MG tablet Take 25 mg by mouth 2 (two) times daily.     Carboxymethylcellul-Glycerin 1-0.25 % SOLN Place 1 drop into both eyes as needed.     carvedilol (COREG) 12.5 MG tablet Take 1 tablet (12.5 mg total) by mouth 2 (two) times daily. 60 tablet 11   cholecalciferol (VITAMIN D) 1000 units tablet Take 1,000 Units by mouth daily.      citalopram (CELEXA) 10 MG tablet Take 1 tablet (10 mg total) by mouth daily. 30 tablet 0   clotrimazole-betamethasone (LOTRISONE) cream clotrimazole-betamethasone 1 %-0.05 % topical cream     co-enzyme Q-10 30 MG capsule Take by mouth.     cyclobenzaprine (FLEXERIL) 5 MG tablet Take 1 tablet (5 mg total) by mouth 3 (three) times daily as  needed for muscle spasms. 30 tablet 2   glipiZIDE (GLUCOTROL) 10 MG tablet 5 mg.     glipiZIDE (GLUCOTROL) 5 MG tablet Take 1 tablet (5 mg total) by mouth 2 (two) times daily before a meal. 60 tablet 1   Glucosamine-Chondroit-Vit C-Mn (GLUCOSAMINE CHONDR 500 COMPLEX PO) Take 1 tablet 2 (two) times daily by mouth.     Krill Oil 1000 MG CAPS Take 1,000 mg daily by mouth.     levothyroxine (SYNTHROID) 100 MCG tablet Take 100 mcg by mouth daily.     lisinopril (PRINIVIL,ZESTRIL) 5 MG tablet Take 1 tablet (5 mg total) by mouth daily. 30 tablet 3   meclizine (ANTIVERT) 25 MG tablet Take 1 tablet (25 mg total) by mouth 3 (three) times daily as needed for dizziness. 30 tablet 1   metFORMIN (GLUCOPHAGE) 500 MG tablet Take 1 tablet (500  mg total) by mouth 2 (two) times daily with a meal. 60 tablet 1   methocarbamol (ROBAXIN) 500 MG tablet Take by mouth.     Multiple Vitamin (MULTIVITAMIN WITH MINERALS) TABS tablet Take 1 tablet daily by mouth.     ondansetron (ZOFRAN) 8 MG tablet ondansetron HCl 8 mg tablet     oxyCODONE (OXY IR/ROXICODONE) 5 MG immediate release tablet oxycodone 5 mg tablet     phenazopyridine (PYRIDIUM) 100 MG tablet phenazopyridine 100 mg tablet  TAKE 1 TABLET BY MOUTH 3 TIMES A DAY     tamsulosin (FLOMAX) 0.4 MG CAPS capsule Take 0.4 mg by mouth daily.     traMADol (ULTRAM) 50 MG tablet      traMADol-acetaminophen (ULTRACET) 37.5-325 MG tablet Take 1 tablet by mouth every 6 (six) hours as needed.     valACYclovir (VALTREX) 1000 MG tablet valacyclovir 1 gram tablet     No current facility-administered medications for this encounter.    Allergies  Allergen Reactions   Sulfa Antibiotics Nausea And Vomiting   Amoxicillin-Pot Clavulanate     Other reaction(s): Other (See Comments) Pt reports caused a metallic taste   Shellfish Allergy Other (See Comments)    UNKNOWN REACTION.  Allergy found on ALLERGY TEST.   Aggrenox [Aspirin-Dipyridamole Er] Other (See Comments)    Redness in  eyes, takes aspirin 325 at home at baseline    Social History   Socioeconomic History   Marital status: Married    Spouse name: Not on file   Number of children: Not on file   Years of education: Not on file   Highest education level: Not on file  Occupational History   Not on file  Tobacco Use   Smoking status: Former    Types: Cigarettes    Start date: 52    Quit date: 1975    Years since quitting: 48.0   Smokeless tobacco: Never   Tobacco comments:    Former smoker 03/05/2021  Vaping Use   Vaping Use: Never used  Substance and Sexual Activity   Alcohol use: No   Drug use: No   Sexual activity: Not on file  Other Topics Concern   Not on file  Social History Narrative   Not on file   Social Determinants of Health   Financial Resource Strain: Not on file  Food Insecurity: Not on file  Transportation Needs: Not on file  Physical Activity: Not on file  Stress: Not on file  Social Connections: Not on file  Intimate Partner Violence: Not on file     ROS- All systems are reviewed and negative except as per the HPI above.  Physical Exam: There were no vitals filed for this visit.   GEN- The patient is a well appearing obese female, alert and oriented x 3 today.   Head- normocephalic, atraumatic Eyes-  Sclera clear, conjunctiva pink Ears- hearing intact Oropharynx- clear Neck- supple  Lungs- Clear to ausculation bilaterally, normal work of breathing Heart- Regular rate and rhythm, no murmurs, rubs or gallops  GI- soft, NT, ND, + BS Extremities- no clubbing, cyanosis, or edema MS- no significant deformity or atrophy Skin- no rash or lesion Psych- euthymic mood, full affect Neuro- strength and sensation are intact  Wt Readings from Last 3 Encounters:  03/05/21 82.7 kg  02/14/21 81.7 kg  08/16/20 82.6 kg    EKG today demonstrates  SR Vent. rate 70 BPM PR interval * ms QRS duration 82 ms QT/QTcB 376/406 ms Sinus rhythm with  artifact    Echo  03/03/18 demonstrated  - Procedure narrative: Transthoracic echocardiography. Image    quality was adequate. The study was technically difficult.  - Left ventricle: The cavity size was normal. Wall thickness was    increased in a pattern of mild LVH. Systolic function was normal.    The estimated ejection fraction was in the range of 50% to 55%.    There is hypokinesis of the inferolateral and inferior    myocardium. The study is not technically sufficient to allow    evaluation of LV diastolic function.  - Mitral valve: There was mild to moderate regurgitation.  - Left atrium: The atrium was mildly dilated.   Impressions:   - Hypokinesis of the inferior/inferolateral walls with overall low    normal LV systolic function; mild LVH; mild to moderate MR; mild    LAE.   Epic records are reviewed at length today  CHA2DS2-VASc Score = 8  The patient's score is based upon: CHF History: 1 HTN History: 1 Diabetes History: 1 Stroke History: 2 Vascular Disease History: 1 Age Score: 1 Gender Score: 1       ASSESSMENT AND PLAN: 1. Paroxysmal Atrial Fibrillation (ICD10:  I48.0) The patient's CHA2DS2-VASc score is 8, indicating a 10.8% annual risk of stroke.   General education about afib provided as well as use of Eliquis.  We also discussed her stroke risk and the risks and benefits of anticoagulation. She is describing some days of dizziness, more so with exertion, some days are better.  No device reports since end of October so husband with check with the device clinic to get a report sent to them to determine afib/afl burden and I have asked for it to be forwarded to my attention I feel lightheadedness is probably 2/2 being out of rhythm vrs side effects of  eliquis She has not had any falls/head trauma  Will check a CBC today Continue Eliquis 5 mg BID.  Continue carvedilol 12.5 mg BID  2. Secondary Hypercoagulable State (ICD10:  D68.69) The patient is at significant risk for  stroke/thromboembolism based upon her CHA2DS2-VASc Score of 8.  Start Apixaban (Eliquis).  Pt did not take  last night or this am, and still feels lightheaded, feel that she is aflutter today . Gave eliquis 5 mg in the office today to minimize risk of stroke  She will resume tonight  Discussed she could consider xarelto/Warfarin  She wished to continue with eliquis for now   3. Obesity There is no height or weight on file to calculate BMI. Lifestyle modification was discussed at length including regular exercise and weight reduction.  4. Snoring/Obstructive sleep apnea The importance of adequate treatment of sleep apnea was discussed today in order to improve our ability to maintain sinus rhythm long term. Will refer for sleep study to resume CPAP.  5. HTN Stable, no changes today.  6. CAD S/p CABG No anginal symptoms.   Patient prefers follow up in the [place] office. Has f/u with Dr. Curt Bears there 04/09/20.   Addendum: 03/27/21- received monitor results, pt is maintaining SR other than a 6 min episode of afib on  03/04/21. I offered to change her to xarelto because I am skeptical that eliquis is the issue with lightheadedness, but her husband reports that she has been feeling great since Christmas Day, so will  hold the course. . She did have a mild  drop in her CBC and I have asked to have this repeated on visit  with Dr. Curt Bears 04/09/21  Geroge Baseman. Yanuel Tagg, [facility] [address] [place], [place] [address] [phone]

## 2021-03-22 NOTE — Telephone Encounter (Signed)
-----   Message from Juluis Mire, RN sent at 03/22/2021  3:44 PM EST ----- Regarding: linq transmission Butch Penny saw this patient today and wanted to see her reports but it says last transmission was sept 30th. Patient was unsure in the office if something was wrong with her box at home? Can someone help her and send transmission report to donna./ Thanks Stacy

## 2021-03-22 NOTE — Telephone Encounter (Signed)
Spoke with patient's husband attempted to send manual transmission, was getting a question mark on the home monitor screen, patiens husband stated that the monitor had gotten unplugged from the wall informed him that the monitor and handset would likely need to charge, patients husband to attempt to send a transmission tomorrow direct number to device clinic given if he has trouble sending a transmission

## 2021-03-23 ENCOUNTER — Telehealth: Payer: Self-pay

## 2021-03-23 NOTE — Telephone Encounter (Signed)
Roderic Palau from the Afib clinic will like to go over this patient transmission with someone. Butch Penny will like for someone to call her [phone]

## 2021-03-23 NOTE — Addendum Note (Signed)
Encounter addended by: Sherran Needs, NP on: 03/23/2021 10:45 AM  Actions taken: Clinical Note Signed

## 2021-03-23 NOTE — Telephone Encounter (Signed)
The patient son is calling tech support to order a new handheld.

## 2021-03-23 NOTE — Telephone Encounter (Signed)
I spoke with the patient and they are calling tech support for a new handheld monitor.

## 2021-03-23 NOTE — Telephone Encounter (Signed)
Transmission reviewed with Roderic Palau, NP. Patient in rhythm today. Last AF episode 03/14/21 for 6 min. NP will call patient to discuss.

## 2021-03-27 NOTE — Addendum Note (Signed)
Encounter addended by: Sherran Needs, NP on: 03/27/2021 8:45 AM  Actions taken: Clinical Note Signed

## 2021-03-28 ENCOUNTER — Ambulatory Visit (INDEPENDENT_AMBULATORY_CARE_PROVIDER_SITE_OTHER): Payer: Medicare HMO

## 2021-03-28 DIAGNOSIS — Z8673 Personal history of transient ischemic attack (TIA), and cerebral infarction without residual deficits: Secondary | ICD-10-CM

## 2021-03-28 LAB — CUP PACEART REMOTE DEVICE CHECK
Date Time Interrogation Session: 20221228044018
Implantable Pulse Generator Implant Date: 20191206

## 2021-03-30 ENCOUNTER — Other Ambulatory Visit (HOSPITAL_COMMUNITY): Payer: Self-pay

## 2021-03-30 MED ORDER — APIXABAN 5 MG PO TABS: 5.0000 mg | ORAL_TABLET | Freq: Two times a day (BID) | ORAL | 0 refills | Status: DC

## 2021-04-08 ENCOUNTER — Other Ambulatory Visit: Payer: Self-pay | Admitting: Cardiovascular Disease

## 2021-04-08 NOTE — Progress Notes (Signed)
Electrophysiology Office Note   Date:  04/09/2021   ID:  Theresa Ray, DOB 01-17-1946, MRN [id number]  PCP:  Theresa Hipps, MD  Cardiologist:  Theresa Ray Primary Electrophysiologist:  Theresa Ray Meredith Leeds, MD    Chief Complaint: AF   History of Present Illness: Theresa Ray is a 76 y.o. female who is being seen today for the evaluation of AF at the request of Theresa Hipps, MD. Presenting today for electrophysiology evaluation.  She has a history seen for coronary artery disease status post CABG, OSA, diastolic heart failure, diabetes, hypertension, CVA, atrial fibrillation.  She was diagnosed with atrial fibrillation postoperatively after CABG.  She had a CVA in 2019 and a Linq monitor was placed.  Device interrogation showed no episodes of atrial fibrillation.  Today, she denies symptoms of palpitations, chest pain, shortness of breath, orthopnea, PND, lower extremity edema, claudication, dizziness, presyncope, syncope, bleeding, or neurologic sequela. The patient is tolerating medications without difficulties.    Past Medical History:  Diagnosis Date   CAD in native artery    a. s/p CABGx3 in 01/2017.   Chronic diastolic CHF (congestive heart failure) (HCC)    Diabetes mellitus ([place])    Diabetic neuropathy ([place])    History of kidney stones    history of stones   Hypertension    Hypothyroidism    NSTEMI (non-ST elevated myocardial infarction) ([place])    Orthostatic hypotension    Postoperative atrial fibrillation ([place])    a. after CABG 01/2017.   TIA (transient ischemic attack)    Past Surgical History:  Procedure Laterality Date   ABDOMINAL HYSTERECTOMY     CORONARY ARTERY BYPASS GRAFT N/A 02/24/2017   Procedure: CORONARY ARTERY BYPASS GRAFTING (CABG) USING LEFT INTERNAL MAMMARY ARTERY TO LAD AND ENDOSCOPICALLY HARVESTED GREATER SAPHENOUS VEIN TO PDA AND TO OM1.;  Surgeon: Grace Isaac, MD;  Location: [facility];  Service: Open Heart Surgery;  Laterality: N/A;    ENDOVEIN HARVEST OF GREATER SAPHENOUS VEIN Right 02/24/2017   Procedure: ENDOVEIN HARVEST OF GREATER SAPHENOUS VEIN;  Surgeon: Grace Isaac, MD;  Location: [facility];  Service: Open Heart Surgery;  Laterality: Right;   LAPAROSCOPIC GASTRIC BANDING     LEFT HEART CATH AND CORONARY ANGIOGRAPHY N/A 02/18/2017   Procedure: LEFT HEART CATH AND CORONARY ANGIOGRAPHY;  Surgeon: Leonie Man, MD;  Location: [facility];  Service: Cardiovascular;  Laterality: N/A;   LOOP RECORDER INSERTION N/A 03/06/2018   Procedure: LOOP RECORDER INSERTION;  Surgeon: Constance Haw, MD;  Location: [facility];  Service: Cardiovascular;  Laterality: N/A;   TEE WITHOUT CARDIOVERSION N/A 02/24/2017   Procedure: TRANSESOPHAGEAL ECHOCARDIOGRAM (TEE);  Surgeon: Grace Isaac, MD;  Location: [facility];  Service: Open Heart Surgery;  Laterality: N/A;   TEE WITHOUT CARDIOVERSION N/A 03/06/2018   Procedure: TRANSESOPHAGEAL ECHOCARDIOGRAM (TEE);  Surgeon: Dorothy Spark, MD;  Location: [facility] ENDOSCOPY;  Service: Cardiovascular;  Laterality: N/A;  loop     Current Outpatient Medications  Medication Sig Dispense Refill   acetaminophen (TYLENOL) 325 MG tablet Take 2 tablets (650 mg total) by mouth every 6 (six) hours as needed for mild pain.     amLODipine (NORVASC) 10 MG tablet TAKE 1 TABLET BY MOUTH DAILY. CALL OFFICE AT [phone] TO SCHEDULE APPT FOR MORE REFILLS 15 tablet 0   apixaban (ELIQUIS) 5 MG TABS tablet Take 1 tablet (5 mg total) by mouth 2 (two) times daily. 14 tablet 0   aspirin 81 MG  EC tablet Take by mouth.     atorvastatin (LIPITOR) 40 MG tablet Take 1 tablet (40 mg total) by mouth daily. 90 tablet 3   Baclofen 5 MG TABS baclofen 5 mg tablet  1 2 TABLET EVERY 8 HOURS AS NEEDED FOR SPASMS     bethanechol (URECHOLINE) 25 MG tablet Take 25 mg by mouth 2 (two) times daily.     Carboxymethylcellul-Glycerin 1-0.25 % SOLN Place 1 drop into both eyes as needed.     carvedilol (COREG) 12.5 MG  tablet Take 1 tablet (12.5 mg total) by mouth 2 (two) times daily. 60 tablet 11   cholecalciferol (VITAMIN D) 1000 units tablet Take 1,000 Units by mouth daily.     citalopram (CELEXA) 10 MG tablet Take 1 tablet (10 mg total) by mouth daily. 30 tablet 0   clotrimazole-betamethasone (LOTRISONE) cream clotrimazole-betamethasone 1 %-0.05 % topical cream     co-enzyme Q-10 30 MG capsule Take by mouth.     cyclobenzaprine (FLEXERIL) 5 MG tablet Take 1 tablet (5 mg total) by mouth 3 (three) times daily as needed for muscle spasms. 30 tablet 2   Glucosamine-Chondroit-Vit C-Mn (GLUCOSAMINE CHONDR 500 COMPLEX PO) Take 1 tablet by mouth 2 (two) times daily.     Krill Oil 1000 MG CAPS Take 1,000 mg by mouth daily.     levothyroxine (SYNTHROID) 100 MCG tablet Take 100 mcg by mouth daily.     lisinopril (PRINIVIL,ZESTRIL) 5 MG tablet Take 1 tablet (5 mg total) by mouth daily. 30 tablet 3   meclizine (ANTIVERT) 25 MG tablet Take 1 tablet (25 mg total) by mouth 3 (three) times daily as needed for dizziness. 30 tablet 1   metFORMIN (GLUCOPHAGE) 500 MG tablet Take 1 tablet (500 mg total) by mouth 2 (two) times daily with a meal. 60 tablet 1   methocarbamol (ROBAXIN) 500 MG tablet Take by mouth.     Multiple Vitamin (MULTIVITAMIN WITH MINERALS) TABS tablet Take 1 tablet by mouth daily.     ondansetron (ZOFRAN) 8 MG tablet      oxyCODONE (OXY IR/ROXICODONE) 5 MG immediate release tablet      phenazopyridine (PYRIDIUM) 100 MG tablet      tamsulosin (FLOMAX) 0.4 MG CAPS capsule Take 0.4 mg by mouth daily.     traMADol (ULTRAM) 50 MG tablet      traMADol-acetaminophen (ULTRACET) 37.5-325 MG tablet Take 1 tablet by mouth every 6 (six) hours as needed.     valACYclovir (VALTREX) 1000 MG tablet      No current facility-administered medications for this visit.    Allergies:   Sulfa antibiotics, Amoxicillin-pot clavulanate, Shellfish allergy, and Aggrenox [aspirin-dipyridamole er]   Social History:  The patient   reports that she quit smoking about 48 years ago. Her smoking use included cigarettes. She started smoking about 56 years ago. She has never used smokeless tobacco. She reports that she does not drink alcohol and does not use drugs.   Family History:  The patient's family history includes CVA in her father; Heart disease (age of onset: 70) in her mother.    ROS:  Please see the history of present illness.   Otherwise, review of systems is positive for none.   All other systems are reviewed and negative.    PHYSICAL EXAM: VS:  BP 120/72    Pulse 88    Ht 5\' 3"  (1.6 m)    Wt 176 lb 6.4 oz (80 kg)    LMP  (LMP Unknown)  SpO2 94%    BMI 31.25 kg/m  , BMI Body mass index is 31.25 kg/m. GEN: Well nourished, well developed, in no acute distress  HEENT: normal  Neck: no JVD, carotid bruits, or masses Cardiac: RRR; no murmurs, rubs, or gallops,no edema  Respiratory:  clear to auscultation bilaterally, normal work of breathing GI: soft, nontender, nondistended, + BS MS: no deformity or atrophy  Skin: warm and dry, device pocket is well healed Neuro:  Strength and sensation are intact Psych: euthymic mood, full affect  EKG:  EKG is not ordered today. Personal review of the ekg ordered 03/22/21 shows sinus rhythm, rate 70  Device interrogation is reviewed today in detail.  See PaceArt for details.   Recent Labs: 03/05/2021: BUN 22; Creatinine, Ser 1.46; Potassium 4.2; Sodium 138 03/22/2021: Hemoglobin 11.4; Platelets 516    Lipid Panel     Component Value Date/Time   CHOL 167 03/03/2018 1046   TRIG 157 (H) 03/03/2018 1046   HDL 37 (L) 03/03/2018 1046   CHOLHDL 4.5 03/03/2018 1046   VLDL 31 03/03/2018 1046   LDLCALC 99 03/03/2018 1046     Wt Readings from Last 3 Encounters:  04/09/21 176 lb 6.4 oz (80 kg)  03/22/21 183 lb 6.4 oz (83.2 kg)  03/05/21 182 lb 6.4 oz (82.7 kg)      Other studies Reviewed: Additional studies/ records that were reviewed today include: TTE 03/03/18   Review of the above records today demonstrates:  - Left ventricle: The cavity size was normal. Wall thickness was    increased in a pattern of mild LVH. Systolic function was normal.    The estimated ejection fraction was in the range of 50% to 55%.    There is hypokinesis of the inferolateral and inferior    myocardium. The study is not technically sufficient to allow    evaluation of LV diastolic function.  - Mitral valve: There was mild to moderate regurgitation.  - Left atrium: The atrium was mildly dilated.    ASSESSMENT AND PLAN:  1.  Paroxysmal atrial fibrillation: CHA2DS2-VASc of 8.  Currently on Eliquis 5 mg twice daily, carvedilol 12.5 mg twice daily.  She is fortunately remained in sinus rhythm.  She is overall happy with her control.  She has not had many symptoms.  Potentially the cause of her stroke.  2.  Obstructive sleep apnea: CPAP compliance encouraged  3.  Hypertension: Currently well controlled  4.  Coronary artery disease: Status post CABG.  Plan per primary cardiology.    Current medicines are reviewed at length with the patient today.   The patient does not have concerns regarding her medicines.  The following changes were made today:  none  Labs/ tests ordered today include:  Orders Placed This Encounter  Procedures   CBC     Disposition:   FU with Theresa Ray 1 year  Signed, Theresa Ray Meredith Leeds, MD  04/09/2021 11:55 AM     [place] [address] [place] [address] [phone] (office) [phone] (fax)

## 2021-04-09 ENCOUNTER — Encounter: Payer: Self-pay | Admitting: Cardiology

## 2021-04-09 ENCOUNTER — Ambulatory Visit (INDEPENDENT_AMBULATORY_CARE_PROVIDER_SITE_OTHER): Payer: Medicare HMO | Admitting: Cardiology

## 2021-04-09 ENCOUNTER — Other Ambulatory Visit: Payer: Self-pay

## 2021-04-09 VITALS — BP 120/72 | HR 88 | Ht 63.0 in | Wt 176.4 lb

## 2021-04-09 DIAGNOSIS — I480 Paroxysmal atrial fibrillation: Secondary | ICD-10-CM

## 2021-04-09 DIAGNOSIS — Z79899 Other long term (current) drug therapy: Secondary | ICD-10-CM

## 2021-04-09 NOTE — Patient Instructions (Signed)
Medication Instructions:  Your physician recommends that you continue on your current medications as directed. Please refer to the Current Medication list given to you today.  *If you need a refill on your cardiac medications before your next appointment, please call your pharmacy*   Lab Work: Today: CBC  If you have labs (blood work) drawn today and your tests are completely normal, you will receive your results only by: [place] (if you have MyChart) OR A paper copy in the mail If you have any lab test that is abnormal or we need to change your treatment, we will call you to review the results.   Testing/Procedures: None ordered   Follow-Up: At [facility], you and your health needs are our priority.  As part of our continuing mission to provide you with exceptional heart care, we have created designated Provider Care Teams.  These Care Teams include your primary Cardiologist (physician) and Advanced Practice Providers (APPs -  Physician Assistants and Nurse Practitioners) who all work together to provide you with the care you need, when you need it.   Remote monitoring is used to monitor your Pacemaker or ICD from home. This monitoring reduces the number of office visits required to check your device to one time per year. It allows [place] to keep an eye on the functioning of your device to ensure it is working properly. You are scheduled for a device check from home on 04/30/2021. You may send your transmission at any time that day. If you have a wireless device, the transmission will be sent automatically. After your physician reviews your transmission, you will receive a postcard with your next transmission date.  Your next appointment:   1 year(s)  The format for your next appointment:   In Person  Provider:   Allegra Lai, MD   Thank you for choosing [facility]!!   Trinidad Curet, RN [phone]

## 2021-04-10 LAB — CBC
Hematocrit: 34.8 % (ref 34.0–46.6)
Hemoglobin: 11.4 g/dL (ref 11.1–15.9)
MCH: 29.8 pg (ref 26.6–33.0)
MCHC: 32.8 g/dL (ref 31.5–35.7)
MCV: 91 fL (ref 79–97)
Platelets: 470 10*3/uL — ABNORMAL HIGH (ref 150–450)
RBC: 3.83 x10E6/uL (ref 3.77–5.28)
RDW: 12.3 % (ref 11.7–15.4)
WBC: 8.5 10*3/uL (ref 3.4–10.8)

## 2021-04-10 NOTE — Progress Notes (Signed)
Carelink Summary Report / Loop Recorder 

## 2021-04-20 NOTE — Progress Notes (Signed)
Pt has been made aware of normal result and verbalized understanding.  jw

## 2021-04-26 ENCOUNTER — Other Ambulatory Visit: Payer: Self-pay

## 2021-04-26 ENCOUNTER — Encounter (INDEPENDENT_AMBULATORY_CARE_PROVIDER_SITE_OTHER): Payer: Self-pay

## 2021-04-26 ENCOUNTER — Ambulatory Visit (HOSPITAL_BASED_OUTPATIENT_CLINIC_OR_DEPARTMENT_OTHER): Payer: Medicare HMO | Attending: Physician Assistant | Admitting: Cardiovascular Disease

## 2021-04-26 DIAGNOSIS — I4891 Unspecified atrial fibrillation: Secondary | ICD-10-CM | POA: Diagnosis not present

## 2021-04-26 DIAGNOSIS — G4733 Obstructive sleep apnea (adult) (pediatric): Secondary | ICD-10-CM

## 2021-04-26 DIAGNOSIS — I493 Ventricular premature depolarization: Secondary | ICD-10-CM | POA: Insufficient documentation

## 2021-04-26 DIAGNOSIS — R0681 Apnea, not elsewhere classified: Secondary | ICD-10-CM | POA: Diagnosis not present

## 2021-04-29 LAB — CUP PACEART REMOTE DEVICE CHECK
Date Time Interrogation Session: 20230128044559
Implantable Pulse Generator Implant Date: 20191206

## 2021-04-30 ENCOUNTER — Ambulatory Visit (INDEPENDENT_AMBULATORY_CARE_PROVIDER_SITE_OTHER): Payer: Medicare HMO

## 2021-04-30 DIAGNOSIS — Z8673 Personal history of transient ischemic attack (TIA), and cerebral infarction without residual deficits: Secondary | ICD-10-CM | POA: Diagnosis not present

## 2021-05-08 NOTE — Progress Notes (Signed)
Carelink Summary Report / Loop Recorder 

## 2021-05-20 ENCOUNTER — Encounter (HOSPITAL_BASED_OUTPATIENT_CLINIC_OR_DEPARTMENT_OTHER): Payer: Self-pay | Admitting: Cardiovascular Disease

## 2021-05-20 NOTE — Procedures (Signed)
Patient Name: Theresa, Ray Date: 04/26/2021 Gender: Female D.O.B: 10-01-1945 Age (years): 65 Referring Provider: Malka So PA Height (inches): 63 Interpreting Physician: Shelva Majestic MD, ABSM Weight (lbs): 170 RPSGT: Baxter Flattery BMI: 30 MRN: [id number] Neck Size: 14.50 <br> <br> CLINICAL INFORMATION Sleep Study Type: NPSG    Indication for sleep study: Obesity, Snoring, Witnesses Apnea / Gasping During Sleep    Epworth Sleepiness Score: 6    SLEEP STUDY TECHNIQUE As per the AASM Manual for the Scoring of Sleep and Associated Events v2.3 (April 2016) with a hypopnea requiring 4% desaturations.  The channels recorded and monitored were frontal, central and occipital EEG, electrooculogram (EOG), submentalis EMG (chin), nasal and oral airflow, thoracic and abdominal wall motion, anterior tibialis EMG, snore microphone, electrocardiogram, and pulse oximetry.  MEDICATIONS acetaminophen (TYLENOL) 325 MG tablet amLODipine (NORVASC) 10 MG tablet apixaban (ELIQUIS) 5 MG TABS tablet aspirin 81 MG EC tablet atorvastatin (LIPITOR) 40 MG tablet (Expired) Baclofen 5 MG TABS bethanechol (URECHOLINE) 25 MG tablet Carboxymethylcellul-Glycerin 1-0.25 % SOLN carvedilol (COREG) 12.5 MG tablet cholecalciferol (VITAMIN D) 1000 units tablet citalopram (CELEXA) 10 MG tablet clotrimazole-betamethasone (LOTRISONE) cream co-enzyme Q-10 30 MG capsule cyclobenzaprine (FLEXERIL) 5 MG tablet Glucosamine-Chondroit-Vit C-Mn (GLUCOSAMINE CHONDR 500 COMPLEX PO) Krill Oil 1000 MG CAPS levothyroxine (SYNTHROID) 100 MCG tablet lisinopril (PRINIVIL,ZESTRIL) 5 MG tablet meclizine (ANTIVERT) 25 MG tablet metFORMIN (GLUCOPHAGE) 500 MG tablet methocarbamol (ROBAXIN) 500 MG tablet Multiple Vitamin (MULTIVITAMIN WITH MINERALS) TABS tablet ondansetron (ZOFRAN) 8 MG tablet oxyCODONE (OXY IR/ROXICODONE) 5 MG immediate release tablet phenazopyridine (PYRIDIUM) 100 MG tablet tamsulosin  (FLOMAX) 0.4 MG CAPS capsule traMADol (ULTRAM) 50 MG tablet traMADol-acetaminophen (ULTRACET) 37.5-325 MG tablet valACYclovir (VALTREX) 1000 MG tablet  Medications self-administered by patient taken the night of the study : N/A  SLEEP ARCHITECTURE The study was initiated at 9:41:18 PM and ended at 4:34:39 AM.  Sleep onset time was 261.3 minutes and the sleep efficiency was 11.7%%. The total sleep time was 48.5 minutes.  Stage REM latency was N/A minutes.  The patient spent 1.0%% of the night in stage N1 sleep, 99.0%% in stage N2 sleep, 0.0%% in stage N3 and 0% in REM.  Alpha intrusion was absent.  Supine sleep was 47.42%.  RESPIRATORY PARAMETERS The overall apnea/hypopnea index (AHI) was 102.7 per hour. There were 74 total apneas, including 74 obstructive, 0 central and 0 mixed apneas. There were 9 hypopneas and 0 RERAs.  The AHI during Stage REM sleep was N/A per hour.  AHI while supine was 96.5 per hour.  The mean oxygen saturation was 93.1%. The minimum SpO2 during sleep was 89.0%.  Moderate snoring was noted during this study.  CARDIAC DATA The 2 lead EKG demonstrated sinus rhythm. The mean heart rate was 62.0 beats per minute. Other EKG findings include: Atrial Fibrillation, PVCs.  LEG MOVEMENT DATA The total PLMS were 0 with a resulting PLMS index of 0.0. Associated arousal with leg movement index was 0.0 .  IMPRESSIONS - Severe obstructive sleep apnea occurred during this study (AHI 102.7/h) with absent REM sleep. - The patient had minimal or no oxygen desaturation during the study (Min O2 = 89.0%) - The patient snored with moderate snoring volume. - Poor sleep efficiency at 11.7% - Markedly prolonged sleep latency at 201.3 minutes; Wake after sleep onset (WASO) 103.5 minutes. - EKG findings include Atrial Fibrillation, PVCs with transient quadrigeminy - Clinically significant periodic limb movements did not occur during sleep. No significant associated  arousals.  DIAGNOSIS - Obstructive Sleep  Apnea (G47.33) - Nocturnal Hypoxemia (G47.36)  RECOMMENDATIONS - In this patient with very severe sleep apnea and poor sleep efficiency recommend an expeditious therapeutic in-lab CPAP titration to determine optimal pressure required to alleviate sleep disordered breathing. - Effort should be made to optimize nasal and oropharyngeal patency. - Avoid alcohol, sedatives and other CNS depressants that may worsen sleep apnea and disrupt normal sleep architecture. - Sleep hygiene should be reviewed to assess factors that may improve sleep quality. - Weight management (BMI 30) and regular exercise should be initiated or continued if appropriate.  [Electronically signed] 05/20/2021 05:02 PM  Shelva Majestic MD, [facility], ABSM Diplomate, American Board of Sleep Medicine   NPI: [id number]  [place] PH: [phone]   FX: [phone] [place]

## 2021-05-21 DIAGNOSIS — R4189 Other symptoms and signs involving cognitive functions and awareness: Secondary | ICD-10-CM | POA: Diagnosis not present

## 2021-05-21 DIAGNOSIS — R339 Retention of urine, unspecified: Secondary | ICD-10-CM | POA: Diagnosis not present

## 2021-05-21 DIAGNOSIS — N200 Calculus of kidney: Secondary | ICD-10-CM | POA: Diagnosis not present

## 2021-05-21 DIAGNOSIS — N952 Postmenopausal atrophic vaginitis: Secondary | ICD-10-CM | POA: Diagnosis not present

## 2021-05-21 DIAGNOSIS — N3946 Mixed incontinence: Secondary | ICD-10-CM | POA: Diagnosis not present

## 2021-05-25 ENCOUNTER — Other Ambulatory Visit: Payer: Self-pay | Admitting: Cardiovascular Disease

## 2021-05-25 ENCOUNTER — Telehealth: Payer: Self-pay | Admitting: *Deleted

## 2021-05-25 DIAGNOSIS — G4733 Obstructive sleep apnea (adult) (pediatric): Secondary | ICD-10-CM

## 2021-05-25 NOTE — Telephone Encounter (Signed)
-----   Message from Troy Sine, MD sent at 05/20/2021  5:10 PM EST ----- Mariann Laster please notify pt and set up for an expeditious in LAB CPAP titration

## 2021-05-25 NOTE — Telephone Encounter (Signed)
Patient notified of sleep study results and recommendations. She agrees to proceed with CPAP titration. 

## 2021-05-29 DIAGNOSIS — L820 Inflamed seborrheic keratosis: Secondary | ICD-10-CM | POA: Diagnosis not present

## 2021-05-30 ENCOUNTER — Telehealth: Payer: Self-pay | Admitting: *Deleted

## 2021-05-30 NOTE — Telephone Encounter (Signed)
Patient notified of CPAP titration appointment scheduled for April 3rd. She was offered appointment for tonight but declined. ?

## 2021-05-31 LAB — CUP PACEART REMOTE DEVICE CHECK
Date Time Interrogation Session: 20230228151308
Implantable Pulse Generator Implant Date: 20191206

## 2021-06-04 ENCOUNTER — Ambulatory Visit (INDEPENDENT_AMBULATORY_CARE_PROVIDER_SITE_OTHER): Payer: Medicare HMO

## 2021-06-04 DIAGNOSIS — E119 Type 2 diabetes mellitus without complications: Secondary | ICD-10-CM | POA: Diagnosis not present

## 2021-06-04 DIAGNOSIS — Z8673 Personal history of transient ischemic attack (TIA), and cerebral infarction without residual deficits: Secondary | ICD-10-CM | POA: Diagnosis not present

## 2021-06-04 DIAGNOSIS — H2513 Age-related nuclear cataract, bilateral: Secondary | ICD-10-CM | POA: Diagnosis not present

## 2021-06-04 DIAGNOSIS — Z0100 Encounter for examination of eyes and vision without abnormal findings: Secondary | ICD-10-CM | POA: Diagnosis not present

## 2021-06-15 NOTE — Progress Notes (Signed)
Carelink Summary Report / Loop Recorder 

## 2021-06-29 ENCOUNTER — Ambulatory Visit: Payer: Medicare HMO

## 2021-06-29 DIAGNOSIS — Z8673 Personal history of transient ischemic attack (TIA), and cerebral infarction without residual deficits: Secondary | ICD-10-CM

## 2021-06-29 LAB — CUP PACEART REMOTE DEVICE CHECK
Date Time Interrogation Session: 20230331055204
Implantable Pulse Generator Implant Date: 20191206

## 2021-06-30 ENCOUNTER — Other Ambulatory Visit (HOSPITAL_COMMUNITY): Payer: Self-pay | Admitting: Physician Assistant

## 2021-07-02 ENCOUNTER — Ambulatory Visit (HOSPITAL_BASED_OUTPATIENT_CLINIC_OR_DEPARTMENT_OTHER): Payer: Medicare HMO | Attending: Cardiovascular Disease | Admitting: Cardiovascular Disease

## 2021-07-02 ENCOUNTER — Other Ambulatory Visit: Payer: Self-pay

## 2021-07-02 DIAGNOSIS — G4733 Obstructive sleep apnea (adult) (pediatric): Secondary | ICD-10-CM

## 2021-07-02 DIAGNOSIS — I493 Ventricular premature depolarization: Secondary | ICD-10-CM | POA: Insufficient documentation

## 2021-07-09 ENCOUNTER — Telehealth: Payer: Self-pay | Admitting: *Deleted

## 2021-07-09 ENCOUNTER — Encounter (HOSPITAL_BASED_OUTPATIENT_CLINIC_OR_DEPARTMENT_OTHER): Payer: Self-pay | Admitting: Cardiovascular Disease

## 2021-07-09 NOTE — Telephone Encounter (Signed)
-----   Message from Troy Sine, MD sent at 07/09/2021  2:09 PM EDT ----- ?Mariann Laster, please notify pt and set up BiPAP initiation with DME ?

## 2021-07-09 NOTE — Telephone Encounter (Signed)
BIPAP order sent to [facility] Patient in [place] marked "urgent." ?

## 2021-07-09 NOTE — Telephone Encounter (Signed)
Left message to return a call to discuss sleep study results and recommendations. 

## 2021-07-09 NOTE — Procedures (Signed)
? ? ? ?Patient Name: Theresa Ray, Theresa Ray ?Study Date: 07/02/2021 ?Gender: Female ?D.O.B: Oct 07, 1945 ?Age (years): 72 ?Referring Provider: Malka So PA ?Height (inches): 63 ?Interpreting Physician: Shelva Majestic MD, ABSM ?Weight (lbs): 170 ?RPSGT: Carolin Coy ?BMI: 30 ?MRN: [id number] ?Neck Size: 15.00 ? ?CLINICAL INFORMATION ?The patient is referred for a BiPAP titration to treat sleep apnea. ? ?Date of NPSG:  04/26/2021:  AHI 102.7/h; RDI 102.7/h; absent REM sleep; supine AHI 95.5/h; O2 nadir 89%. ? ?SLEEP STUDY TECHNIQUE ?As per the AASM Manual for the Scoring of Sleep and Associated Events v2.3 (April 2016) with a hypopnea requiring 4% desaturations. ? ?The channels recorded and monitored were frontal, central and occipital EEG, electrooculogram (EOG), submentalis EMG (chin), nasal and oral airflow, thoracic and abdominal wall motion, anterior tibialis EMG, snore microphone, electrocardiogram, and pulse oximetry. Bilevel positive airway pressure (BPAP) was initiated at the beginning of the study and titrated to treat sleep-disordered breathing. ? ?MEDICATIONS ?acetaminophen (TYLENOL) 325 MG tablet ?amLODipine (NORVASC) 10 MG tablet ?aspirin 81 MG EC tablet ?atorvastatin (LIPITOR) 40 MG tablet (Expired) ?Baclofen 5 MG TABS ?bethanechol (URECHOLINE) 25 MG tablet ?Carboxymethylcellul-Glycerin 1-0.25 % SOLN ?carvedilol (COREG) 12.5 MG tablet ?cholecalciferol (VITAMIN D) 1000 units tablet ?citalopram (CELEXA) 10 MG tablet ?clotrimazole-betamethasone (LOTRISONE) cream ?co-enzyme Q-10 30 MG capsule ?cyclobenzaprine (FLEXERIL) 5 MG tablet ?ELIQUIS 5 MG TABS tablet ?Glucosamine-Chondroit-Vit C-Mn (GLUCOSAMINE CHONDR 500 COMPLEX PO) ?Krill Oil 1000 MG CAPS ?levothyroxine (SYNTHROID) 100 MCG tablet ?lisinopril (PRINIVIL,ZESTRIL) 5 MG tablet ?meclizine (ANTIVERT) 25 MG tablet ?metFORMIN (GLUCOPHAGE) 500 MG tablet ?methocarbamol (ROBAXIN) 500 MG tablet ?Multiple Vitamin (MULTIVITAMIN WITH MINERALS) TABS tablet ?ondansetron  (ZOFRAN) 8 MG tablet ?oxyCODONE (OXY IR/ROXICODONE) 5 MG immediate release tablet ?phenazopyridine (PYRIDIUM) 100 MG tablet ?tamsulosin (FLOMAX) 0.4 MG CAPS capsule ?traMADol (ULTRAM) 50 MG tablet ?traMADol-acetaminophen (ULTRACET) 37.5-325 MG tablet ?valACYclovir (VALTREX) 1000 MG tablet  ?Medications self-administered by patient taken the night of the study : MELATONIN, TYLENOL PM ? ?RESPIRATORY PARAMETERS ?Optimal IPAP Pressure (cm): 24 AHI at Optimal Pressure (/hr) 0 ?Optimal EPAP Pressure (cm): 20  ? ?Overall Minimal O2 (%): 85.0 Minimal O2 at Optimal Pressure (%): 98.0 ? ?SLEEP ARCHITECTURE ?Start Time: 10:24:46 PM Stop Time: 5:16:54 AM Total Time (min): 412.1 Total Sleep Time (min): 182.5 ?Sleep Latency (min): 41.1 Sleep Efficiency (%): 44.3% REM Latency (min): 122.5 WASO (min): 188.6 ?Stage N1 (%): 12.9% Stage N2 (%): 76.7% Stage N3 (%): 4.4% Stage R (%): 6 ?Supine (%): 100.00 Arousal Index (/hr): 51.9  ? ?CARDIAC DATA ?The 2 lead EKG demonstrated sinus rhythm. The mean heart rate was 59.4 beats per minute. Other EKG findings include: PVCs. ? ?LEG MOVEMENT DATA ?The total Periodic Limb Movements of Sleep (PLMS) were 0. The PLMS index was 0.0. A PLMS index of <15 is considered normal in adults. ? ?IMPRESSIONS ?- CPAP was initiated at 5 cm, was titrated to 17 cm and transitioned to BIPAP at 21/17 and titrated to 24/20 cm of water. (AHI 0; RDI 4.9/h, O2 nadir 98%) ?- Central sleep apnea was not noted during this titration (CAI = 0.3/h). ?- Moderete oxygen desaturations to a nadir of 85% at 15 cm of water. ?- The patient snored with loud snoring volume. ?- 2-lead EKG demonstrated: rare isolated PVCs ?- Clinically significant periodic limb movements were not noted during this study. Arousals associated with PLMs were rare. ? ?DIAGNOSIS ?- Obstructive Sleep Apnea (G47.33) ? ?RECOMMENDATIONS ?- Recommend an initial trial of BiPAP Auto therapy with EPAP min of 20, PS of 4, and IPAP max of 25 cm of  water with heated  humidification.  A Medium size Resmed Full Face AirFit F10 mask was used for the titration. ?- Effort should be made to optimize nasal and oropharyngeal patency. ?- Avoid alcohol, sedatives and other CNS depressants that may worsen sleep apnea and disrupt normal sleep architecture. ?- Sleep hygiene should be reviewed to assess factors that may improve sleep quality. ?- Weight management and regular exercise should be initiated or continued. ?- Recommend a download and sleep clinic evaluation after 4 weeks of therapy. ? ?[Electronically signed] 07/09/2021 02:03 PM ? ?Shelva Majestic MD, [facility], ABSM ?Diplomate, Tax adviser of Sleep Medicine ? ?NPI: [id number] ? ?[place] ?PH: [phone]   FX: [phone] ?ACCREDITED BY THE AMERICAN ACADEMY OF SLEEP MEDICINE ? ?

## 2021-07-11 NOTE — Progress Notes (Signed)
Carelink Summary Report / Loop Recorder 

## 2021-07-12 DIAGNOSIS — G4733 Obstructive sleep apnea (adult) (pediatric): Secondary | ICD-10-CM | POA: Diagnosis not present

## 2021-07-16 DIAGNOSIS — Z0000 Encounter for general adult medical examination without abnormal findings: Secondary | ICD-10-CM | POA: Diagnosis not present

## 2021-07-16 DIAGNOSIS — Z6834 Body mass index (BMI) 34.0-34.9, adult: Secondary | ICD-10-CM | POA: Diagnosis not present

## 2021-07-16 DIAGNOSIS — E1129 Type 2 diabetes mellitus with other diabetic kidney complication: Secondary | ICD-10-CM | POA: Diagnosis not present

## 2021-07-16 DIAGNOSIS — Z79899 Other long term (current) drug therapy: Secondary | ICD-10-CM | POA: Diagnosis not present

## 2021-07-16 DIAGNOSIS — Z1331 Encounter for screening for depression: Secondary | ICD-10-CM | POA: Diagnosis not present

## 2021-07-16 DIAGNOSIS — I10 Essential (primary) hypertension: Secondary | ICD-10-CM | POA: Diagnosis not present

## 2021-07-16 DIAGNOSIS — E039 Hypothyroidism, unspecified: Secondary | ICD-10-CM | POA: Diagnosis not present

## 2021-07-30 ENCOUNTER — Telehealth: Payer: Self-pay | Admitting: Cardiology

## 2021-07-30 NOTE — Telephone Encounter (Signed)
Pt called to report has used 2 or 3 mask.  Feels that air pressure is too high.  Can not get a good seal so loud that wakes up dog 2 rooms over.  Can't use machine as ordered d/t ill fitting/ setting.  Advised pt will send to Dr. Claiborne Billings and his primary nurse to review.   ?

## 2021-07-30 NOTE — Telephone Encounter (Signed)
Patient calling question concern with her sleep machine. Please advis e ?

## 2021-08-02 ENCOUNTER — Telehealth: Payer: Self-pay | Admitting: Cardiovascular Disease

## 2021-08-02 NOTE — Telephone Encounter (Signed)
New Message: ? ? ? ?Patient said she needs her C-Pap machine adjusted asap. The pressure s at 24. ?

## 2021-08-02 NOTE — Telephone Encounter (Signed)
Called [facility] to see if they will get  a download from the patient. Her BIPAP machine does not have remote access. We cannot help the patient until we can verify what is going on by reviewing a download. The RT states this was "my fault. I was not aware the BIPAP machines we have are card to cloud." She will inform the patient that once we get the download we will be contacting her.to see if we can assist her. ?

## 2021-08-06 NOTE — Telephone Encounter (Signed)
Called patient, advised I would route message to sleep coordinator to review and call back.  ?Patient verbalized understanding.  ? ?

## 2021-08-06 NOTE — Telephone Encounter (Signed)
Patient said that the pressure on her mask is too high and it blows out of her mask. It gets too loud and she has to take it off. She will need it adjusted again  ?

## 2021-08-07 NOTE — Telephone Encounter (Signed)
Patient notified that Dr Claiborne Billings was informed she feels BIPAP pressures are too high and some adjustments has been made. Call back if she has any other problems and or concerns. My direct phone line contact given to the patient. ?

## 2021-08-09 ENCOUNTER — Ambulatory Visit (INDEPENDENT_AMBULATORY_CARE_PROVIDER_SITE_OTHER): Payer: Medicare HMO

## 2021-08-09 DIAGNOSIS — Z8673 Personal history of transient ischemic attack (TIA), and cerebral infarction without residual deficits: Secondary | ICD-10-CM | POA: Diagnosis not present

## 2021-08-09 LAB — CUP PACEART REMOTE DEVICE CHECK
Date Time Interrogation Session: 20230510232217
Implantable Pulse Generator Implant Date: 20191206

## 2021-08-11 DIAGNOSIS — G4733 Obstructive sleep apnea (adult) (pediatric): Secondary | ICD-10-CM | POA: Diagnosis not present

## 2021-08-15 DIAGNOSIS — G4733 Obstructive sleep apnea (adult) (pediatric): Secondary | ICD-10-CM | POA: Diagnosis not present

## 2021-08-16 DIAGNOSIS — E039 Hypothyroidism, unspecified: Secondary | ICD-10-CM | POA: Diagnosis not present

## 2021-08-16 DIAGNOSIS — I4891 Unspecified atrial fibrillation: Secondary | ICD-10-CM | POA: Diagnosis not present

## 2021-08-16 DIAGNOSIS — Z6834 Body mass index (BMI) 34.0-34.9, adult: Secondary | ICD-10-CM | POA: Diagnosis not present

## 2021-08-16 DIAGNOSIS — L820 Inflamed seborrheic keratosis: Secondary | ICD-10-CM | POA: Diagnosis not present

## 2021-08-16 NOTE — Progress Notes (Signed)
Carelink Summary Report / Loop Recorder 

## 2021-08-20 DIAGNOSIS — Z9884 Bariatric surgery status: Secondary | ICD-10-CM | POA: Diagnosis not present

## 2021-08-20 DIAGNOSIS — N200 Calculus of kidney: Secondary | ICD-10-CM | POA: Diagnosis not present

## 2021-08-21 DIAGNOSIS — N200 Calculus of kidney: Secondary | ICD-10-CM | POA: Diagnosis not present

## 2021-08-21 DIAGNOSIS — N952 Postmenopausal atrophic vaginitis: Secondary | ICD-10-CM | POA: Diagnosis not present

## 2021-08-21 DIAGNOSIS — R339 Retention of urine, unspecified: Secondary | ICD-10-CM | POA: Diagnosis not present

## 2021-08-21 DIAGNOSIS — R4189 Other symptoms and signs involving cognitive functions and awareness: Secondary | ICD-10-CM | POA: Diagnosis not present

## 2021-08-21 DIAGNOSIS — N3946 Mixed incontinence: Secondary | ICD-10-CM | POA: Diagnosis not present

## 2021-08-30 DIAGNOSIS — L820 Inflamed seborrheic keratosis: Secondary | ICD-10-CM | POA: Diagnosis not present

## 2021-08-31 DIAGNOSIS — R5383 Other fatigue: Secondary | ICD-10-CM | POA: Diagnosis not present

## 2021-08-31 DIAGNOSIS — R400 Somnolence: Secondary | ICD-10-CM | POA: Diagnosis not present

## 2021-08-31 DIAGNOSIS — G4733 Obstructive sleep apnea (adult) (pediatric): Secondary | ICD-10-CM | POA: Diagnosis not present

## 2021-08-31 DIAGNOSIS — R0600 Dyspnea, unspecified: Secondary | ICD-10-CM | POA: Diagnosis not present

## 2021-09-06 DIAGNOSIS — Z1231 Encounter for screening mammogram for malignant neoplasm of breast: Secondary | ICD-10-CM | POA: Diagnosis not present

## 2021-09-07 DIAGNOSIS — N1330 Unspecified hydronephrosis: Secondary | ICD-10-CM | POA: Diagnosis not present

## 2021-09-07 DIAGNOSIS — K8020 Calculus of gallbladder without cholecystitis without obstruction: Secondary | ICD-10-CM | POA: Diagnosis not present

## 2021-09-07 DIAGNOSIS — K5730 Diverticulosis of large intestine without perforation or abscess without bleeding: Secondary | ICD-10-CM | POA: Diagnosis not present

## 2021-09-07 DIAGNOSIS — N134 Hydroureter: Secondary | ICD-10-CM | POA: Diagnosis not present

## 2021-09-07 DIAGNOSIS — N200 Calculus of kidney: Secondary | ICD-10-CM | POA: Diagnosis not present

## 2021-09-07 DIAGNOSIS — I700 Atherosclerosis of aorta: Secondary | ICD-10-CM | POA: Diagnosis not present

## 2021-09-11 ENCOUNTER — Ambulatory Visit (INDEPENDENT_AMBULATORY_CARE_PROVIDER_SITE_OTHER): Payer: Medicare HMO

## 2021-09-11 DIAGNOSIS — Z8673 Personal history of transient ischemic attack (TIA), and cerebral infarction without residual deficits: Secondary | ICD-10-CM

## 2021-09-11 DIAGNOSIS — G4733 Obstructive sleep apnea (adult) (pediatric): Secondary | ICD-10-CM | POA: Diagnosis not present

## 2021-09-11 LAB — CUP PACEART REMOTE DEVICE CHECK
Date Time Interrogation Session: 20230610231711
Implantable Pulse Generator Implant Date: 20191206

## 2021-09-14 DIAGNOSIS — R400 Somnolence: Secondary | ICD-10-CM | POA: Diagnosis not present

## 2021-09-14 DIAGNOSIS — G4733 Obstructive sleep apnea (adult) (pediatric): Secondary | ICD-10-CM | POA: Diagnosis not present

## 2021-09-14 DIAGNOSIS — R0600 Dyspnea, unspecified: Secondary | ICD-10-CM | POA: Diagnosis not present

## 2021-09-14 DIAGNOSIS — R5383 Other fatigue: Secondary | ICD-10-CM | POA: Diagnosis not present

## 2021-09-26 ENCOUNTER — Ambulatory Visit: Payer: Medicare HMO | Admitting: Cardiovascular Disease

## 2021-09-26 NOTE — Progress Notes (Signed)
Carelink Summary Report / Loop Recorder 

## 2021-10-05 DIAGNOSIS — Z6835 Body mass index (BMI) 35.0-35.9, adult: Secondary | ICD-10-CM | POA: Diagnosis not present

## 2021-10-05 DIAGNOSIS — E1129 Type 2 diabetes mellitus with other diabetic kidney complication: Secondary | ICD-10-CM | POA: Diagnosis not present

## 2021-10-05 DIAGNOSIS — Z79899 Other long term (current) drug therapy: Secondary | ICD-10-CM | POA: Diagnosis not present

## 2021-10-05 DIAGNOSIS — E039 Hypothyroidism, unspecified: Secondary | ICD-10-CM | POA: Diagnosis not present

## 2021-10-11 DIAGNOSIS — G4733 Obstructive sleep apnea (adult) (pediatric): Secondary | ICD-10-CM | POA: Diagnosis not present

## 2021-10-11 LAB — CUP PACEART REMOTE DEVICE CHECK
Date Time Interrogation Session: 20230711232621
Implantable Pulse Generator Implant Date: 20191206

## 2021-10-15 ENCOUNTER — Ambulatory Visit (INDEPENDENT_AMBULATORY_CARE_PROVIDER_SITE_OTHER): Payer: Medicare HMO

## 2021-10-15 DIAGNOSIS — I639 Cerebral infarction, unspecified: Secondary | ICD-10-CM | POA: Diagnosis not present

## 2021-10-16 ENCOUNTER — Ambulatory Visit: Payer: Medicare HMO | Admitting: Cardiovascular Disease

## 2021-10-17 DIAGNOSIS — G4733 Obstructive sleep apnea (adult) (pediatric): Secondary | ICD-10-CM | POA: Diagnosis not present

## 2021-10-29 DIAGNOSIS — E039 Hypothyroidism, unspecified: Secondary | ICD-10-CM | POA: Diagnosis not present

## 2021-10-29 DIAGNOSIS — E7800 Pure hypercholesterolemia, unspecified: Secondary | ICD-10-CM | POA: Diagnosis not present

## 2021-11-11 DIAGNOSIS — G4733 Obstructive sleep apnea (adult) (pediatric): Secondary | ICD-10-CM | POA: Diagnosis not present

## 2021-11-13 DIAGNOSIS — R5383 Other fatigue: Secondary | ICD-10-CM | POA: Diagnosis not present

## 2021-11-13 DIAGNOSIS — G4700 Insomnia, unspecified: Secondary | ICD-10-CM | POA: Diagnosis not present

## 2021-11-13 DIAGNOSIS — R0602 Shortness of breath: Secondary | ICD-10-CM | POA: Diagnosis not present

## 2021-11-13 DIAGNOSIS — G4733 Obstructive sleep apnea (adult) (pediatric): Secondary | ICD-10-CM | POA: Diagnosis not present

## 2021-11-14 NOTE — Progress Notes (Signed)
Carelink Summary Report / Loop Recorder 

## 2021-11-15 LAB — CUP PACEART REMOTE DEVICE CHECK
Date Time Interrogation Session: 20230811232733
Implantable Pulse Generator Implant Date: 20191206

## 2021-11-16 DIAGNOSIS — G4733 Obstructive sleep apnea (adult) (pediatric): Secondary | ICD-10-CM | POA: Diagnosis not present

## 2021-11-21 DIAGNOSIS — N952 Postmenopausal atrophic vaginitis: Secondary | ICD-10-CM | POA: Diagnosis not present

## 2021-11-21 DIAGNOSIS — R339 Retention of urine, unspecified: Secondary | ICD-10-CM | POA: Diagnosis not present

## 2021-11-21 DIAGNOSIS — N3946 Mixed incontinence: Secondary | ICD-10-CM | POA: Diagnosis not present

## 2021-11-21 DIAGNOSIS — R4189 Other symptoms and signs involving cognitive functions and awareness: Secondary | ICD-10-CM | POA: Diagnosis not present

## 2021-11-22 ENCOUNTER — Telehealth: Payer: Self-pay

## 2021-11-22 NOTE — Telephone Encounter (Signed)
Spoke with patients husband informed him of patients loop reaching RRT he stated for now leave loop recorder in. Patients husband stated he was grateful for everything at [facility] has done for her.

## 2021-11-28 DIAGNOSIS — K8020 Calculus of gallbladder without cholecystitis without obstruction: Secondary | ICD-10-CM | POA: Diagnosis not present

## 2021-11-28 DIAGNOSIS — N1330 Unspecified hydronephrosis: Secondary | ICD-10-CM | POA: Diagnosis not present

## 2021-11-28 DIAGNOSIS — R339 Retention of urine, unspecified: Secondary | ICD-10-CM | POA: Diagnosis not present

## 2021-11-29 DIAGNOSIS — E7800 Pure hypercholesterolemia, unspecified: Secondary | ICD-10-CM | POA: Diagnosis not present

## 2021-11-29 DIAGNOSIS — E039 Hypothyroidism, unspecified: Secondary | ICD-10-CM | POA: Diagnosis not present

## 2021-12-12 DIAGNOSIS — G4733 Obstructive sleep apnea (adult) (pediatric): Secondary | ICD-10-CM | POA: Diagnosis not present

## 2021-12-19 DIAGNOSIS — Z87891 Personal history of nicotine dependence: Secondary | ICD-10-CM | POA: Diagnosis not present

## 2021-12-19 DIAGNOSIS — E669 Obesity, unspecified: Secondary | ICD-10-CM | POA: Diagnosis not present

## 2021-12-19 DIAGNOSIS — R7989 Other specified abnormal findings of blood chemistry: Secondary | ICD-10-CM | POA: Diagnosis not present

## 2021-12-19 DIAGNOSIS — I129 Hypertensive chronic kidney disease with stage 1 through stage 4 chronic kidney disease, or unspecified chronic kidney disease: Secondary | ICD-10-CM | POA: Diagnosis not present

## 2021-12-19 DIAGNOSIS — N1832 Chronic kidney disease, stage 3b: Secondary | ICD-10-CM | POA: Diagnosis not present

## 2021-12-19 DIAGNOSIS — Z6836 Body mass index (BMI) 36.0-36.9, adult: Secondary | ICD-10-CM | POA: Diagnosis not present

## 2021-12-19 DIAGNOSIS — Z7984 Long term (current) use of oral hypoglycemic drugs: Secondary | ICD-10-CM | POA: Diagnosis not present

## 2021-12-19 DIAGNOSIS — E1122 Type 2 diabetes mellitus with diabetic chronic kidney disease: Secondary | ICD-10-CM | POA: Diagnosis not present

## 2021-12-26 DIAGNOSIS — R4189 Other symptoms and signs involving cognitive functions and awareness: Secondary | ICD-10-CM | POA: Diagnosis not present

## 2021-12-26 DIAGNOSIS — N952 Postmenopausal atrophic vaginitis: Secondary | ICD-10-CM | POA: Diagnosis not present

## 2021-12-26 DIAGNOSIS — R339 Retention of urine, unspecified: Secondary | ICD-10-CM | POA: Diagnosis not present

## 2021-12-26 DIAGNOSIS — N3946 Mixed incontinence: Secondary | ICD-10-CM | POA: Diagnosis not present

## 2021-12-26 DIAGNOSIS — N1330 Unspecified hydronephrosis: Secondary | ICD-10-CM | POA: Diagnosis not present

## 2022-01-11 DIAGNOSIS — G4733 Obstructive sleep apnea (adult) (pediatric): Secondary | ICD-10-CM | POA: Diagnosis not present

## 2022-01-15 DIAGNOSIS — R5383 Other fatigue: Secondary | ICD-10-CM | POA: Diagnosis not present

## 2022-01-15 DIAGNOSIS — G4733 Obstructive sleep apnea (adult) (pediatric): Secondary | ICD-10-CM | POA: Diagnosis not present

## 2022-01-29 DIAGNOSIS — E7800 Pure hypercholesterolemia, unspecified: Secondary | ICD-10-CM | POA: Diagnosis not present

## 2022-01-29 DIAGNOSIS — E039 Hypothyroidism, unspecified: Secondary | ICD-10-CM | POA: Diagnosis not present

## 2022-02-06 DIAGNOSIS — N3946 Mixed incontinence: Secondary | ICD-10-CM | POA: Diagnosis not present

## 2022-02-06 DIAGNOSIS — R4189 Other symptoms and signs involving cognitive functions and awareness: Secondary | ICD-10-CM | POA: Diagnosis not present

## 2022-02-06 DIAGNOSIS — N1330 Unspecified hydronephrosis: Secondary | ICD-10-CM | POA: Diagnosis not present

## 2022-02-06 DIAGNOSIS — R339 Retention of urine, unspecified: Secondary | ICD-10-CM | POA: Diagnosis not present

## 2022-02-11 DIAGNOSIS — G4733 Obstructive sleep apnea (adult) (pediatric): Secondary | ICD-10-CM | POA: Diagnosis not present

## 2022-02-13 ENCOUNTER — Ambulatory Visit: Payer: Medicare HMO | Admitting: Cardiovascular Disease

## 2022-02-13 NOTE — Progress Notes (Deleted)
No chief complaint on file.  History of Present Illness: 76 yo female with history of chronic diastolic CHF, DM, HTN and CAD s/p CABG who is here today for cardiac follow up. She was admitted to [facility] in November 2018 with a NSTEMI and found to have severe three vessel CAD. She underwent 3 V CABG on 02/24/17 (LIMA to LAD, SVG to PDA, SVG to OM1). She had post op atrial fib and converted to sinus on amiodarone prior to discharge. Several episodes of syncope post discharge and she was felt to be hypovolemic. Her Norvasc and Lasix was stopped and Coreg dosage was reduced. Cardiac event monitor was normal. Symptoms resolved. Echo 02/17/17 with LVEF=55-60%, mild MR. Cardiac monitor November 2019 with no evidence of atrial fibrillation. She had a stroke in December 2019.  TEE 03/06/18 with no intracardiac thrombus and no PFO or ASD. Loop recorder was placed. No atrial fib noted on monitor.   *** Why is she on Eliquis *** ? CVA in OSH  She is here today for follow up. The patient denies any chest pain, dyspnea, palpitations, lower extremity edema, orthopnea, PND, dizziness, near syncope or syncope.   Primary Care Physician: Ronita Hipps, MD  Past Medical History:  Diagnosis Date   CAD in native artery    a. s/p CABGx3 in 01/2017.   Chronic diastolic CHF (congestive heart failure) (HCC)    Diabetes mellitus ([place])    Diabetic neuropathy ([place])    History of kidney stones    history of stones   Hypertension    Hypothyroidism    NSTEMI (non-ST elevated myocardial infarction) ([place])    Orthostatic hypotension    Postoperative atrial fibrillation ([place])    a. after CABG 01/2017.   TIA (transient ischemic attack)     Past Surgical History:  Procedure Laterality Date   ABDOMINAL HYSTERECTOMY     CORONARY ARTERY BYPASS GRAFT N/A 02/24/2017   Procedure: CORONARY ARTERY BYPASS GRAFTING (CABG) USING LEFT INTERNAL MAMMARY ARTERY TO LAD AND ENDOSCOPICALLY HARVESTED GREATER SAPHENOUS VEIN TO PDA AND TO  OM1.;  Surgeon: Grace Isaac, MD;  Location: [place];  Service: Open Heart Surgery;  Laterality: N/A;   ENDOVEIN HARVEST OF GREATER SAPHENOUS VEIN Right 02/24/2017   Procedure: ENDOVEIN HARVEST OF GREATER SAPHENOUS VEIN;  Surgeon: Grace Isaac, MD;  Location: [place];  Service: Open Heart Surgery;  Laterality: Right;   LAPAROSCOPIC GASTRIC BANDING     LEFT HEART CATH AND CORONARY ANGIOGRAPHY N/A 02/18/2017   Procedure: LEFT HEART CATH AND CORONARY ANGIOGRAPHY;  Surgeon: Leonie Man, MD;  Location: [place] CV LAB;  Service: Cardiovascular;  Laterality: N/A;   LOOP RECORDER INSERTION N/A 03/06/2018   Procedure: LOOP RECORDER INSERTION;  Surgeon: Constance Haw, MD;  Location: [place] CV [facility];  Service: Cardiovascular;  Laterality: N/A;   TEE WITHOUT CARDIOVERSION N/A 02/24/2017   Procedure: TRANSESOPHAGEAL ECHOCARDIOGRAM (TEE);  Surgeon: Grace Isaac, MD;  Location: [place];  Service: Open Heart Surgery;  Laterality: N/A;   TEE WITHOUT CARDIOVERSION N/A 03/06/2018   Procedure: TRANSESOPHAGEAL ECHOCARDIOGRAM (TEE);  Surgeon: Dorothy Spark, MD;  Location: [facility] ENDOSCOPY;  Service: Cardiovascular;  Laterality: N/A;  loop    Current Outpatient Medications  Medication Sig Dispense Refill   acetaminophen (TYLENOL) 325 MG tablet Take 2 tablets (650 mg total) by mouth every 6 (six) hours as needed for mild pain.     amLODipine (NORVASC) 10 MG tablet TAKE 1 TABLET BY MOUTH DAILY. CALL OFFICE  AT [phone] TO SCHEDULE APPT FOR MORE REFILLS 90 tablet 3   aspirin 81 MG EC tablet Take by mouth.     atorvastatin (LIPITOR) 40 MG tablet Take 1 tablet (40 mg total) by mouth daily. 90 tablet 3   Baclofen 5 MG TABS baclofen 5 mg tablet  1 2 TABLET EVERY 8 HOURS AS NEEDED FOR SPASMS     bethanechol (URECHOLINE) 25 MG tablet Take 25 mg by mouth 2 (two) times daily.     Carboxymethylcellul-Glycerin 1-0.25 % SOLN Place 1 drop into both eyes as needed.     carvedilol (COREG) 12.5 MG  tablet Take 1 tablet (12.5 mg total) by mouth 2 (two) times daily. 60 tablet 11   cholecalciferol (VITAMIN D) 1000 units tablet Take 1,000 Units by mouth daily.     citalopram (CELEXA) 10 MG tablet Take 1 tablet (10 mg total) by mouth daily. 30 tablet 0   clotrimazole-betamethasone (LOTRISONE) cream clotrimazole-betamethasone 1 %-0.05 % topical cream     co-enzyme Q-10 30 MG capsule Take by mouth.     cyclobenzaprine (FLEXERIL) 5 MG tablet Take 1 tablet (5 mg total) by mouth 3 (three) times daily as needed for muscle spasms. 30 tablet 2   ELIQUIS 5 MG TABS tablet TAKE 1 TABLET BY MOUTH TWICE A DAY 60 tablet 6   Glucosamine-Chondroit-Vit C-Mn (GLUCOSAMINE CHONDR 500 COMPLEX PO) Take 1 tablet by mouth 2 (two) times daily.     Krill Oil 1000 MG CAPS Take 1,000 mg by mouth daily.     levothyroxine (SYNTHROID) 100 MCG tablet Take 100 mcg by mouth daily.     lisinopril (PRINIVIL,ZESTRIL) 5 MG tablet Take 1 tablet (5 mg total) by mouth daily. 30 tablet 3   meclizine (ANTIVERT) 25 MG tablet Take 1 tablet (25 mg total) by mouth 3 (three) times daily as needed for dizziness. 30 tablet 1   metFORMIN (GLUCOPHAGE) 500 MG tablet Take 1 tablet (500 mg total) by mouth 2 (two) times daily with a meal. 60 tablet 1   methocarbamol (ROBAXIN) 500 MG tablet Take by mouth.     Multiple Vitamin (MULTIVITAMIN WITH MINERALS) TABS tablet Take 1 tablet by mouth daily.     ondansetron (ZOFRAN) 8 MG tablet      oxyCODONE (OXY IR/ROXICODONE) 5 MG immediate release tablet      phenazopyridine (PYRIDIUM) 100 MG tablet      tamsulosin (FLOMAX) 0.4 MG CAPS capsule Take 0.4 mg by mouth daily.     traMADol (ULTRAM) 50 MG tablet      traMADol-acetaminophen (ULTRACET) 37.5-325 MG tablet Take 1 tablet by mouth every 6 (six) hours as needed.     valACYclovir (VALTREX) 1000 MG tablet      No current facility-administered medications for this visit.    Allergies  Allergen Reactions   Sulfa Antibiotics Nausea And Vomiting    Amoxicillin-Pot Clavulanate     Other reaction(s): Other (See Comments) Pt reports caused a metallic taste   Shellfish Allergy Other (See Comments)    UNKNOWN REACTION.  Allergy found on ALLERGY TEST.   Aggrenox [Aspirin-Dipyridamole Er] Other (See Comments)    Redness in eyes, takes aspirin 325 at home at baseline    Social History   Socioeconomic History   Marital status: Married    Spouse name: Not on file   Number of children: Not on file   Years of education: Not on file   Highest education level: Not on file  Occupational History   Not on  file  Tobacco Use   Smoking status: Former    Types: Cigarettes    Start date: 1967    Quit date: 1975    Years since quitting: 48.9   Smokeless tobacco: Never   Tobacco comments:    Former smoker 03/05/2021  Vaping Use   Vaping Use: Never used  Substance and Sexual Activity   Alcohol use: No   Drug use: No   Sexual activity: Not on file  Other Topics Concern   Not on file  Social History Narrative   Not on file   Social Determinants of Health   Financial Resource Strain: Not on file  Food Insecurity: Not on file  Transportation Needs: Not on file  Physical Activity: Not on file  Stress: Not on file  Social Connections: Not on file  Intimate Partner Violence: Not on file    Family History  Problem Relation Age of Onset   Heart disease Mother 80       CABG   CVA Father     Review of Systems:  As stated in the HPI and otherwise negative.   LMP  (LMP Unknown)   Physical Examination:  General: Well developed, well nourished, NAD  HEENT: OP clear, mucus membranes moist  SKIN: warm, dry. No rashes. Neuro: No focal deficits  Musculoskeletal: Muscle strength 5/5 all ext  Psychiatric: Mood and affect normal  Neck: No JVD, no carotid bruits, no thyromegaly, no lymphadenopathy.  Lungs:Clear bilaterally, no wheezes, rhonci, crackles Cardiovascular: Regular rate and rhythm. No murmurs, gallops or rubs. Abdomen:Soft.  Bowel sounds present. Non-tender.  Extremities: No lower extremity edema. Pulses are 2 + in the bilateral DP/PT.  EKG:  EKG is not  *** ordered today. The ekg ordered today demonstrates   Recent Labs: 03/05/2021: BUN 22; Creatinine, Ser 1.46; Potassium 4.2; Sodium 138 04/09/2021: Hemoglobin 11.4; Platelets 470   Lipid Panel    Component Value Date/Time   CHOL 167 03/03/2018 1046   TRIG 157 (H) 03/03/2018 1046   HDL 37 (L) 03/03/2018 1046   CHOLHDL 4.5 03/03/2018 1046   VLDL 31 03/03/2018 1046   LDLCALC 99 03/03/2018 1046     Wt Readings from Last 3 Encounters:  07/02/21 170 lb (77.1 kg)  04/27/21 170 lb (77.1 kg)  04/09/21 176 lb 6.4 oz (80 kg)     Assessment and Plan:   1. CAD s/p CABG without angina: She had CABG in November 2018.  No chest pain. Continue ASA, Plavix, beta blocker and statin.  Lipids followed in primary care and well controlled. LDL ***.    2. Post op atrial fibrillation: No events reported on monitor  3. Chronic diastolic CHF: Weight is stable. No volume overload exam.  ***  Current medicines are reviewed at length with the patient today.  The patient does not have concerns regarding medicines.  The following changes have been made:  no change  Labs/ tests ordered today include:   No orders of the defined types were placed in this encounter.  Disposition:   FU with me in 12  months  Signed, Lauree Chandler, MD 02/13/2022 11:14 AM    [address], [place], [place]  [address] Phone: [phone]; Fax: [phone]

## 2022-02-14 DIAGNOSIS — G4733 Obstructive sleep apnea (adult) (pediatric): Secondary | ICD-10-CM | POA: Diagnosis not present

## 2022-02-14 NOTE — Progress Notes (Signed)
Chief Complaint  Patient presents with   Follow-up    CAD, atrial fib    History of Present Illness: 76 yo female with history of chronic diastolic CHF, DM, HTN, sleep apnea, paroxysmal atrial fibrillation and CAD s/p CABG who is here today for cardiac follow up. She was admitted to [facility] in November 2018 with a NSTEMI and found to have severe three vessel CAD. She underwent 3 V CABG on 02/24/17 (LIMA to LAD, SVG to PDA, SVG to OM1). She had post op atrial fib and converted to sinus on amiodarone prior to discharge. Several episodes of syncope post discharge and she was felt to be hypovolemic. Her Norvasc and Lasix were stopped and Coreg dosage was reduced. Cardiac event monitor was normal. Symptoms resolved. Echo 02/17/17 with LVEF=55-60%, mild MR. Cardiac monitor November 2019 with no evidence of atrial fibrillation. She had a stroke in December 2019.  TEE 03/06/18 with no intracardiac thrombus and no PFO or ASD. Loop recorder was placed. No atrial fib noted on monitor.   She is here today for follow up. The patient denies any chest pain, dyspnea, palpitations, lower extremity edema, orthopnea, PND, dizziness, near syncope or syncope.   Primary Care Physician: Ronita Hipps, MD  Past Medical History:  Diagnosis Date   CAD in native artery    a. s/p CABGx3 in 01/2017.   Chronic diastolic CHF (congestive heart failure) (HCC)    Diabetes mellitus ([place])    Diabetic neuropathy ([place])    History of kidney stones    history of stones   Hypertension    Hypothyroidism    NSTEMI (non-ST elevated myocardial infarction) ([place])    Orthostatic hypotension    Postoperative atrial fibrillation ([place])    a. after CABG 01/2017.   TIA (transient ischemic attack)     Past Surgical History:  Procedure Laterality Date   ABDOMINAL HYSTERECTOMY     CORONARY ARTERY BYPASS GRAFT N/A 02/24/2017   Procedure: CORONARY ARTERY BYPASS GRAFTING (CABG) USING LEFT INTERNAL MAMMARY ARTERY TO LAD AND ENDOSCOPICALLY  HARVESTED GREATER SAPHENOUS VEIN TO PDA AND TO OM1.;  Surgeon: Grace Isaac, MD;  Location: [place];  Service: Open Heart Surgery;  Laterality: N/A;   ENDOVEIN HARVEST OF GREATER SAPHENOUS VEIN Right 02/24/2017   Procedure: ENDOVEIN HARVEST OF GREATER SAPHENOUS VEIN;  Surgeon: Grace Isaac, MD;  Location: [place];  Service: Open Heart Surgery;  Laterality: Right;   LAPAROSCOPIC GASTRIC BANDING     LEFT HEART CATH AND CORONARY ANGIOGRAPHY N/A 02/18/2017   Procedure: LEFT HEART CATH AND CORONARY ANGIOGRAPHY;  Surgeon: Leonie Man, MD;  Location: [place] CV LAB;  Service: Cardiovascular;  Laterality: N/A;   LOOP RECORDER INSERTION N/A 03/06/2018   Procedure: LOOP RECORDER INSERTION;  Surgeon: Constance Haw, MD;  Location: [place] CV LAB;  Service: Cardiovascular;  Laterality: N/A;   TEE WITHOUT CARDIOVERSION N/A 02/24/2017   Procedure: TRANSESOPHAGEAL ECHOCARDIOGRAM (TEE);  Surgeon: Grace Isaac, MD;  Location: [place];  Service: Open Heart Surgery;  Laterality: N/A;   TEE WITHOUT CARDIOVERSION N/A 03/06/2018   Procedure: TRANSESOPHAGEAL ECHOCARDIOGRAM (TEE);  Surgeon: Dorothy Spark, MD;  Location: [facility] ENDOSCOPY;  Service: Cardiovascular;  Laterality: N/A;  loop    Current Outpatient Medications  Medication Sig Dispense Refill   acetaminophen (TYLENOL) 325 MG tablet Take 2 tablets (650 mg total) by mouth every 6 (six) hours as needed for mild pain.     amLODipine (NORVASC) 10 MG tablet TAKE 1 TABLET  BY MOUTH DAILY. CALL OFFICE AT [phone] TO SCHEDULE APPT FOR MORE REFILLS 90 tablet 3   aspirin 81 MG EC tablet Take by mouth.     Baclofen 5 MG TABS baclofen 5 mg tablet  1 2 TABLET EVERY 8 HOURS AS NEEDED FOR SPASMS     bethanechol (URECHOLINE) 25 MG tablet Take 25 mg by mouth 2 (two) times daily.     Carboxymethylcellul-Glycerin 1-0.25 % SOLN Place 1 drop into both eyes as needed.     carvedilol (COREG) 12.5 MG tablet Take 1 tablet (12.5 mg total) by mouth 2  (two) times daily. 60 tablet 11   cholecalciferol (VITAMIN D) 1000 units tablet Take 1,000 Units by mouth daily.     co-enzyme Q-10 30 MG capsule Take by mouth.     cyclobenzaprine (FLEXERIL) 5 MG tablet Take 1 tablet (5 mg total) by mouth 3 (three) times daily as needed for muscle spasms. 30 tablet 2   ELIQUIS 5 MG TABS tablet TAKE 1 TABLET BY MOUTH TWICE A DAY 60 tablet 6   Glucosamine-Chondroit-Vit C-Mn (GLUCOSAMINE CHONDR 500 COMPLEX PO) Take 1 tablet by mouth 2 (two) times daily.     Krill Oil 1000 MG CAPS Take 1,000 mg by mouth daily.     levothyroxine (SYNTHROID) 100 MCG tablet Take 100 mcg by mouth daily.     lisinopril (PRINIVIL,ZESTRIL) 5 MG tablet Take 1 tablet (5 mg total) by mouth daily. 30 tablet 3   meclizine (ANTIVERT) 25 MG tablet Take 1 tablet (25 mg total) by mouth 3 (three) times daily as needed for dizziness. 30 tablet 1   methocarbamol (ROBAXIN) 500 MG tablet Take by mouth.     Multiple Vitamin (MULTIVITAMIN WITH MINERALS) TABS tablet Take 1 tablet by mouth daily.     ondansetron (ZOFRAN) 8 MG tablet      oxyCODONE (OXY IR/ROXICODONE) 5 MG immediate release tablet      phenazopyridine (PYRIDIUM) 100 MG tablet      tamsulosin (FLOMAX) 0.4 MG CAPS capsule Take 0.4 mg by mouth daily.     traMADol (ULTRAM) 50 MG tablet      atorvastatin (LIPITOR) 40 MG tablet Take 1 tablet (40 mg total) by mouth daily. 90 tablet 3   No current facility-administered medications for this visit.    Allergies  Allergen Reactions   Sulfa Antibiotics Nausea And Vomiting   Amoxicillin-Pot Clavulanate     Other reaction(s): Other (See Comments) Pt reports caused a metallic taste   Shellfish Allergy Other (See Comments)    UNKNOWN REACTION.  Allergy found on ALLERGY TEST.   Aggrenox [Aspirin-Dipyridamole Er] Other (See Comments)    Redness in eyes, takes aspirin 325 at home at baseline    Social History   Socioeconomic History   Marital status: Married    Spouse name: Not on file    Number of children: Not on file   Years of education: Not on file   Highest education level: Not on file  Occupational History   Not on file  Tobacco Use   Smoking status: Former    Types: Cigarettes    Start date: 65    Quit date: 1975    Years since quitting: 48.9   Smokeless tobacco: Never   Tobacco comments:    Former smoker 03/05/2021  Vaping Use   Vaping Use: Never used  Substance and Sexual Activity   Alcohol use: No   Drug use: No   Sexual activity: Not on file  Other Topics  Concern   Not on file  Social History Narrative   Not on file   Social Determinants of Health   Financial Resource Strain: Not on file  Food Insecurity: Not on file  Transportation Needs: Not on file  Physical Activity: Not on file  Stress: Not on file  Social Connections: Not on file  Intimate Partner Violence: Not on file    Family History  Problem Relation Age of Onset   Heart disease Mother 23       CABG   CVA Father     Review of Systems:  As stated in the HPI and otherwise negative.   BP 128/70   Pulse 61   Ht '5\' 3"'$  (1.6 m)   Wt 203 lb (92.1 kg)   LMP  (LMP Unknown)   SpO2 98%   BMI 35.96 kg/m   Physical Examination:  General: Well developed, well nourished, NAD  HEENT: OP clear, mucus membranes moist  SKIN: warm, dry. No rashes. Neuro: No focal deficits  Musculoskeletal: Muscle strength 5/5 all ext  Psychiatric: Mood and affect normal  Neck: No JVD, no carotid bruits, no thyromegaly, no lymphadenopathy.  Lungs:Clear bilaterally, no wheezes, rhonci, crackles Cardiovascular: Regular rate and rhythm. No murmurs, gallops or rubs. Abdomen:Soft. Bowel sounds present. Non-tender.  Extremities: No lower extremity edema. Pulses are 2 + in the bilateral DP/PT.  EKG:  EKG is ordered today. The ekg ordered today demonstrates NSR, rate 61 bpm  Recent Labs: 03/05/2021: BUN 22; Creatinine, Ser 1.46; Potassium 4.2; Sodium 138 04/09/2021: Hemoglobin 11.4; Platelets 470    Lipid Panel    Component Value Date/Time   CHOL 167 03/03/2018 1046   TRIG 157 (H) 03/03/2018 1046   HDL 37 (L) 03/03/2018 1046   CHOLHDL 4.5 03/03/2018 1046   VLDL 31 03/03/2018 1046   LDLCALC 99 03/03/2018 1046     Wt Readings from Last 3 Encounters:  02/15/22 203 lb (92.1 kg)  07/02/21 170 lb (77.1 kg)  04/27/21 170 lb (77.1 kg)     Assessment and Plan:   1. CAD s/p CABG without angina: She had CABG in November 2018.  No chest pain. Continue ASA, beta blocker and statin.  Lipids followed in primary care and well controlled.   2. Paroxysmal atrial fibrillation: No palpitations. Continue beta blocker and Eliquis.   3. Chronic diastolic CHF: Weight is stable. No volume overload exam.    Labs/ tests ordered today include:   Orders Placed This Encounter  Procedures   EKG 12-Lead   Disposition:   F/U with me in 12  months  Signed, Lauree Chandler, MD 02/15/2022 12:27 PM    [facility] [address], [place], [place]  [address] Phone: [phone]; Fax: [phone]

## 2022-02-15 ENCOUNTER — Encounter: Payer: Self-pay | Admitting: Cardiovascular Disease

## 2022-02-15 ENCOUNTER — Ambulatory Visit: Payer: Medicare HMO | Attending: Cardiovascular Disease | Admitting: Cardiovascular Disease

## 2022-02-15 VITALS — BP 128/70 | HR 61 | Ht 63.0 in | Wt 203.0 lb

## 2022-02-15 DIAGNOSIS — I2510 Atherosclerotic heart disease of native coronary artery without angina pectoris: Secondary | ICD-10-CM | POA: Diagnosis not present

## 2022-02-15 DIAGNOSIS — I480 Paroxysmal atrial fibrillation: Secondary | ICD-10-CM

## 2022-02-15 NOTE — Patient Instructions (Signed)
Medication Instructions:  No changes. *If you need a refill on your cardiac medications before your next appointment, please call your pharmacy*   Lab Work: None. If you have labs (blood work) drawn today and your tests are completely normal, you will receive your results only by: [place] (if you have MyChart) OR A paper copy in the mail If you have any lab test that is abnormal or we need to change your treatment, we will call you to review the results.   Testing/Procedures: None   Follow-Up: At [facility], you and your health needs are our priority.  As part of our continuing mission to provide you with exceptional heart care, we have created designated Provider Care Teams.  These Care Teams include your primary Cardiologist (physician) and Advanced Practice Providers (APPs -  Physician Assistants and Nurse Practitioners) who all work together to provide you with the care you need, when you need it.  We recommend signing up for the patient portal called "MyChart".  Sign up information is provided on this After Visit Summary.  MyChart is used to connect with patients for Virtual Visits (Telemedicine).  Patients are able to view lab/test results, encounter notes, upcoming appointments, etc.  Non-urgent messages can be sent to your provider as well.   To learn more about what you can do with MyChart, go to [email or web address].    Your next appointment:   1 year(s)  The format for your next appointment:   In Person  Provider:   Lauree Chandler, MD      Important Information About Sugar

## 2022-03-13 DIAGNOSIS — E1129 Type 2 diabetes mellitus with other diabetic kidney complication: Secondary | ICD-10-CM | POA: Diagnosis not present

## 2022-03-13 DIAGNOSIS — G4733 Obstructive sleep apnea (adult) (pediatric): Secondary | ICD-10-CM | POA: Diagnosis not present

## 2022-03-14 DIAGNOSIS — G4733 Obstructive sleep apnea (adult) (pediatric): Secondary | ICD-10-CM | POA: Diagnosis not present

## 2022-03-15 DIAGNOSIS — R5383 Other fatigue: Secondary | ICD-10-CM | POA: Diagnosis not present

## 2022-03-15 DIAGNOSIS — G4733 Obstructive sleep apnea (adult) (pediatric): Secondary | ICD-10-CM | POA: Diagnosis not present

## 2022-03-15 DIAGNOSIS — R0600 Dyspnea, unspecified: Secondary | ICD-10-CM | POA: Diagnosis not present

## 2022-03-21 ENCOUNTER — Telehealth: Payer: Self-pay | Admitting: Cardiovascular Disease

## 2022-03-21 DIAGNOSIS — N1832 Chronic kidney disease, stage 3b: Secondary | ICD-10-CM | POA: Diagnosis not present

## 2022-03-21 NOTE — Telephone Encounter (Signed)
Called pt to get more information about potential switch to warfarin.  Phone continued to ring with no answer unable to leave a message.

## 2022-03-21 NOTE — Telephone Encounter (Signed)
Pt c/o medication issue:  1. Name of Medication: ELIQUIS 5 MG TABS tablet   2. How are you currently taking this medication (dosage and times per day)? TAKE 1 TABLET BY MOUTH TWICE A DAY   3. Are you having a reaction (difficulty breathing--STAT)? no  4. What is your medication issue? Patient was calling in to see if she can switch the medication to warfin because it becoming to expensive. Please advise

## 2022-03-31 DIAGNOSIS — E039 Hypothyroidism, unspecified: Secondary | ICD-10-CM | POA: Diagnosis not present

## 2022-03-31 DIAGNOSIS — E7800 Pure hypercholesterolemia, unspecified: Secondary | ICD-10-CM | POA: Diagnosis not present

## 2022-04-05 NOTE — Telephone Encounter (Signed)
Left detailed message that if she still needs to discuss switching off Eliquis to something less expensive to call us back.  The cost could have been high until the beginning of 2024 and now may be back under her insurance.

## 2022-04-13 DIAGNOSIS — G4733 Obstructive sleep apnea (adult) (pediatric): Secondary | ICD-10-CM | POA: Diagnosis not present

## 2022-04-15 DIAGNOSIS — G4733 Obstructive sleep apnea (adult) (pediatric): Secondary | ICD-10-CM | POA: Diagnosis not present

## 2022-04-22 DIAGNOSIS — G4733 Obstructive sleep apnea (adult) (pediatric): Secondary | ICD-10-CM | POA: Diagnosis not present

## 2022-04-25 ENCOUNTER — Other Ambulatory Visit: Payer: Self-pay | Admitting: Cardiovascular Disease

## 2022-04-29 DIAGNOSIS — R5383 Other fatigue: Secondary | ICD-10-CM | POA: Diagnosis not present

## 2022-04-29 DIAGNOSIS — G4733 Obstructive sleep apnea (adult) (pediatric): Secondary | ICD-10-CM | POA: Diagnosis not present

## 2022-05-06 DIAGNOSIS — R4189 Other symptoms and signs involving cognitive functions and awareness: Secondary | ICD-10-CM | POA: Diagnosis not present

## 2022-05-06 DIAGNOSIS — N952 Postmenopausal atrophic vaginitis: Secondary | ICD-10-CM | POA: Diagnosis not present

## 2022-05-06 DIAGNOSIS — N3946 Mixed incontinence: Secondary | ICD-10-CM | POA: Diagnosis not present

## 2022-05-06 DIAGNOSIS — R339 Retention of urine, unspecified: Secondary | ICD-10-CM | POA: Diagnosis not present

## 2022-05-09 ENCOUNTER — Encounter (HOSPITAL_COMMUNITY): Payer: Self-pay | Admitting: *Deleted

## 2022-05-10 DIAGNOSIS — N390 Urinary tract infection, site not specified: Secondary | ICD-10-CM | POA: Diagnosis not present

## 2022-05-13 DIAGNOSIS — H2513 Age-related nuclear cataract, bilateral: Secondary | ICD-10-CM | POA: Diagnosis not present

## 2022-05-13 DIAGNOSIS — E119 Type 2 diabetes mellitus without complications: Secondary | ICD-10-CM | POA: Diagnosis not present

## 2022-05-15 DIAGNOSIS — G4733 Obstructive sleep apnea (adult) (pediatric): Secondary | ICD-10-CM | POA: Diagnosis not present

## 2022-05-22 DIAGNOSIS — L820 Inflamed seborrheic keratosis: Secondary | ICD-10-CM | POA: Diagnosis not present

## 2022-06-10 DIAGNOSIS — G4733 Obstructive sleep apnea (adult) (pediatric): Secondary | ICD-10-CM | POA: Diagnosis not present

## 2022-06-10 DIAGNOSIS — R5383 Other fatigue: Secondary | ICD-10-CM | POA: Diagnosis not present

## 2022-07-03 DIAGNOSIS — E119 Type 2 diabetes mellitus without complications: Secondary | ICD-10-CM | POA: Diagnosis not present

## 2022-07-03 DIAGNOSIS — H2513 Age-related nuclear cataract, bilateral: Secondary | ICD-10-CM | POA: Diagnosis not present

## 2022-07-12 DIAGNOSIS — N1832 Chronic kidney disease, stage 3b: Secondary | ICD-10-CM | POA: Diagnosis not present

## 2022-07-15 DIAGNOSIS — I129 Hypertensive chronic kidney disease with stage 1 through stage 4 chronic kidney disease, or unspecified chronic kidney disease: Secondary | ICD-10-CM | POA: Diagnosis not present

## 2022-07-15 DIAGNOSIS — E1122 Type 2 diabetes mellitus with diabetic chronic kidney disease: Secondary | ICD-10-CM | POA: Diagnosis not present

## 2022-07-15 DIAGNOSIS — N184 Chronic kidney disease, stage 4 (severe): Secondary | ICD-10-CM | POA: Diagnosis not present

## 2022-07-15 DIAGNOSIS — E669 Obesity, unspecified: Secondary | ICD-10-CM | POA: Diagnosis not present

## 2022-07-22 DIAGNOSIS — G4733 Obstructive sleep apnea (adult) (pediatric): Secondary | ICD-10-CM | POA: Diagnosis not present

## 2022-07-22 DIAGNOSIS — R5383 Other fatigue: Secondary | ICD-10-CM | POA: Diagnosis not present

## 2022-07-26 ENCOUNTER — Telehealth: Payer: Self-pay

## 2022-07-26 NOTE — Patient Outreach (Signed)
  Care Coordination   07/26/2022 Name: Theresa Ray MRN: [id number] DOB: 25-Nov-1945   Care Coordination Outreach Attempts:  An unsuccessful telephone outreach was attempted today to offer the patient information about available care coordination services as a benefit of their health plan.   Follow Up Plan:  Additional outreach attempts will be made to offer the patient care coordination information and services.   Encounter Outcome:  No Answer   Care Coordination Interventions:  No, not indicated    Rowe Pavy, RN, BSN, [facility] [phone]

## 2022-07-31 ENCOUNTER — Telehealth: Payer: Self-pay

## 2022-07-31 NOTE — Patient Outreach (Signed)
  Care Coordination   Initial Visit Note   07/31/2022 Name: Theresa Ray MRN: [id number] DOB: [birth year]  Theresa Ray is a 77 y.o. year old female who sees Marylen Ponto, MD for primary care. I spoke with  Frederich Chick by phone today.  What matters to the patients health and wellness today?  Placed call to patient to review and offer [facility] care coordination program. Patient reports that she is doing well with the exception of the cost of her Eliquis. Reports 200-400 monthly.  Reports she can not afford this and would like some help.  States if she can not get help she is interested in another medication.      SDOH assessments and interventions completed:  No     Care Coordination Interventions:  Yes, provided   Interventions Today    Flowsheet Row Most Recent Value  Pharmacy Interventions   Pharmacy Dicussed/Reviewed Pharmacy Topics Discussed, Affording Medications, Referral to Pharmacist  Referral to Pharmacist Cannot afford medications        Follow up plan: Referral made to pharmacy    Encounter Outcome:  Pt. Visit Completed   Rowe Pavy, RN, BSN, CEN [facility] Coordinator [phone]

## 2022-08-02 ENCOUNTER — Telehealth: Payer: Self-pay | Admitting: Pharmacy Technician

## 2022-08-02 DIAGNOSIS — Z5986 Financial insecurity: Secondary | ICD-10-CM

## 2022-08-02 NOTE — Progress Notes (Addendum)
[facility] Customer service manager [facility])  [facility] Quality Pharmacy Team   08/02/2022  PHALON KATSAROS April 01, 1946 [id number]  Reason for referral: Medication assistance for Eliquis  Referral source: [facility] RN Rowe Pavy Current insurance: [employer]ia Pouch  Outreach:  Successful telephone call with patient.  HIPAA identifiers verified.   Medication Review Findings:  Patient takes Eliquis prescribed by Dr. Verne Carrow   Medication Assistance Findings:  Medication assistance needs identified: Eliquis . Patient meets income guidelines. Patient may or may not have met OOP guidelines of 3% as established by [employer]  Extra Help:  Not eligible for Extra Help Low Income Subsidy based on reported income and assets  Additional medication assistance options reviewed with patient as warranted:  No other options identified  Plan: I will route patient assistance letter to [facility] pharmacy technician who will coordinate patient assistance program application process for medications listed above.  [facility] pharmacy technician will assist with obtaining all required documents from both patient and provider(s) and submit application(s) once completed.  Thank you for allowing [facility] pharmacy to be a part of this patient's care.   Lyndee Herbst P. Derl Abalos, CPhT [employer] [employer]  [phone]

## 2022-08-05 DIAGNOSIS — M8580 Other specified disorders of bone density and structure, unspecified site: Secondary | ICD-10-CM | POA: Diagnosis not present

## 2022-08-05 DIAGNOSIS — Z1339 Encounter for screening examination for other mental health and behavioral disorders: Secondary | ICD-10-CM | POA: Diagnosis not present

## 2022-08-05 DIAGNOSIS — E1129 Type 2 diabetes mellitus with other diabetic kidney complication: Secondary | ICD-10-CM | POA: Diagnosis not present

## 2022-08-05 DIAGNOSIS — Z0000 Encounter for general adult medical examination without abnormal findings: Secondary | ICD-10-CM | POA: Diagnosis not present

## 2022-08-05 DIAGNOSIS — E039 Hypothyroidism, unspecified: Secondary | ICD-10-CM | POA: Diagnosis not present

## 2022-08-05 DIAGNOSIS — I4891 Unspecified atrial fibrillation: Secondary | ICD-10-CM | POA: Diagnosis not present

## 2022-08-05 DIAGNOSIS — Z1331 Encounter for screening for depression: Secondary | ICD-10-CM | POA: Diagnosis not present

## 2022-08-05 DIAGNOSIS — Z6835 Body mass index (BMI) 35.0-35.9, adult: Secondary | ICD-10-CM | POA: Diagnosis not present

## 2022-08-05 DIAGNOSIS — Z79899 Other long term (current) drug therapy: Secondary | ICD-10-CM | POA: Diagnosis not present

## 2022-08-08 ENCOUNTER — Telehealth: Payer: Self-pay | Admitting: Pharmacy Technician

## 2022-08-08 DIAGNOSIS — Z5986 Financial insecurity: Secondary | ICD-10-CM

## 2022-08-08 NOTE — Progress Notes (Signed)
[employer] Customer service manager [facility])                                            [facility] Quality Pharmacy Team    08/08/2022  Theresa Ray 1945-05-14 [id number]                                      Medication Assistance Referral  Referral From:  [facility] CM RN Rowe Pavy  Medication/Company: Eliquis / BMS Patient application portion:  Mailed Provider application portion: Faxed  to Dr. Verne Carrow Provider address/fax verified via: Office website    Casen Pryor P. Ceasia Elwell, CPhT [employer]  [phone]

## 2022-08-09 ENCOUNTER — Telehealth: Payer: Self-pay

## 2022-08-09 NOTE — Telephone Encounter (Signed)
**Note De-Identified Marilu Rylander Obfuscation** We received the providers page of the pts BMSPAF application for Eliquis assistance from [facility], CPhT with [facility] with a request for [place] to complete and to fax it back to her. I completed the page, Dr Clifton James signed and dated it and I faxed it back to [place] at [phone]. I did receive confirmation that the fax was sent successfully.

## 2022-08-13 DIAGNOSIS — N184 Chronic kidney disease, stage 4 (severe): Secondary | ICD-10-CM | POA: Diagnosis not present

## 2022-08-13 DIAGNOSIS — I129 Hypertensive chronic kidney disease with stage 1 through stage 4 chronic kidney disease, or unspecified chronic kidney disease: Secondary | ICD-10-CM | POA: Diagnosis not present

## 2022-08-13 DIAGNOSIS — E1122 Type 2 diabetes mellitus with diabetic chronic kidney disease: Secondary | ICD-10-CM | POA: Diagnosis not present

## 2022-08-13 DIAGNOSIS — E669 Obesity, unspecified: Secondary | ICD-10-CM | POA: Diagnosis not present

## 2022-09-17 DIAGNOSIS — N390 Urinary tract infection, site not specified: Secondary | ICD-10-CM | POA: Diagnosis not present

## 2022-09-17 DIAGNOSIS — N3946 Mixed incontinence: Secondary | ICD-10-CM | POA: Diagnosis not present

## 2022-09-17 DIAGNOSIS — R339 Retention of urine, unspecified: Secondary | ICD-10-CM | POA: Diagnosis not present

## 2022-09-18 DIAGNOSIS — G4733 Obstructive sleep apnea (adult) (pediatric): Secondary | ICD-10-CM | POA: Diagnosis not present

## 2022-09-18 DIAGNOSIS — R5383 Other fatigue: Secondary | ICD-10-CM | POA: Diagnosis not present

## 2022-09-30 ENCOUNTER — Ambulatory Visit: Payer: Medicare HMO | Attending: Cardiology | Admitting: Cardiology

## 2022-09-30 ENCOUNTER — Encounter: Payer: Self-pay | Admitting: Cardiology

## 2022-09-30 VITALS — BP 102/58 | HR 64 | Ht 63.0 in | Wt 195.0 lb

## 2022-09-30 DIAGNOSIS — G4733 Obstructive sleep apnea (adult) (pediatric): Secondary | ICD-10-CM

## 2022-09-30 DIAGNOSIS — I2510 Atherosclerotic heart disease of native coronary artery without angina pectoris: Secondary | ICD-10-CM

## 2022-09-30 DIAGNOSIS — I10 Essential (primary) hypertension: Secondary | ICD-10-CM

## 2022-09-30 DIAGNOSIS — D6869 Other thrombophilia: Secondary | ICD-10-CM

## 2022-09-30 DIAGNOSIS — I480 Paroxysmal atrial fibrillation: Secondary | ICD-10-CM

## 2022-09-30 NOTE — Progress Notes (Signed)
   Electrophysiology Office Note:   Date:  09/30/2022  ID:  Theresa Ray, DOB 11-Feb-1946, MRN [id number]  Primary Cardiologist: Verne Carrow, MD Electrophysiologist: Regan Lemming, MD      History of Present Illness:   Theresa Ray is a 77 y.o. female with h/o atrial fibrillation seen today for routine electrophysiology followup.  Since last being seen in our clinic the patient reports doing well.  She has had no further episodes of atrial fibrillation.  She has been completely asymptomatic.  She has been able to do all of her daily activities without restriction.  She has had no further neurologic symptoms.  she denies chest pain, palpitations, dyspnea, PND, orthopnea, nausea, vomiting, dizziness, syncope, edema, weight gain, or early satiety.     She has a history seen for coronary artery disease post CABG, OSA, diastolic heart failure, diabetes, hypertension, CVA, atrial fibrillation.  She was diagnosed with atrial fibrillation postop after CABG.  She had a CVA in 2019 and the Linq monitor was implanted.  It showed no episodes of atrial fibrillation.     Review of systems complete and found to be negative unless listed in HPI.   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 Personal review as below.   Sinus rhythm, artifact, rate 64    Risk Assessment/Calculations:    CHA2DS2-VASc Score = 7   This indicates a 11.2% annual risk of stroke. The patient's score is based upon: CHF History: 0 HTN History: 1 Diabetes History: 1 Stroke History: 2 Vascular Disease History: 1 Age Score: 1 Gender Score: 1             Physical Exam:   VS:  BP (!) 102/58   Pulse 64   Ht 5\' 3"  (1.6 m)   Wt 195 lb (88.5 kg)   LMP  (LMP Unknown)   SpO2 99%   BMI 34.54 kg/m    Wt Readings from Last 3 Encounters:  09/30/22 195 lb (88.5 kg)  02/15/22 203 lb (92.1 kg)  07/02/21 170 lb (77.1 kg)      GEN: Well nourished, well developed in no acute distress NECK: No JVD; No carotid bruits CARDIAC: Regular rate and rhythm, no murmurs, rubs, gallops RESPIRATORY:  Clear to auscultation without rales, wheezing or rhonchi  ABDOMEN: Soft, non-tender, non-distended EXTREMITIES:  No edema; No deformity   ILR Interrogation- reviewed in detail today,  See PACEART report  ASSESSMENT AND PLAN:    1.  Paroxysmal atrial fibrillation: Currently on Eliquis and carvedilol.  She is well rate controlled.  Minimal episodes of atrial fibrillation.  Continue with current management.  2.  Obstructive sleep apnea: CPAP compliance encouraged  3.  Hypertension: Currently well-controlled  4.  Coronary artery disease: Post CABG.  Plan per primary cardiology  5.  Secondary hypercoagulable state: Currently on Eliquis for atrial fibrillation     Follow up with Dr. Elberta Fortis in 12 months  Signed, Shannen Vernon Jorja Loa, MD

## 2022-12-06 DIAGNOSIS — G4733 Obstructive sleep apnea (adult) (pediatric): Secondary | ICD-10-CM | POA: Diagnosis not present

## 2022-12-10 DIAGNOSIS — G4733 Obstructive sleep apnea (adult) (pediatric): Secondary | ICD-10-CM | POA: Diagnosis not present

## 2022-12-23 DIAGNOSIS — R5383 Other fatigue: Secondary | ICD-10-CM | POA: Diagnosis not present

## 2022-12-23 DIAGNOSIS — G4733 Obstructive sleep apnea (adult) (pediatric): Secondary | ICD-10-CM | POA: Diagnosis not present

## 2023-01-09 DIAGNOSIS — N184 Chronic kidney disease, stage 4 (severe): Secondary | ICD-10-CM | POA: Diagnosis not present

## 2023-01-14 DIAGNOSIS — E669 Obesity, unspecified: Secondary | ICD-10-CM | POA: Diagnosis not present

## 2023-01-14 DIAGNOSIS — N184 Chronic kidney disease, stage 4 (severe): Secondary | ICD-10-CM | POA: Diagnosis not present

## 2023-01-14 DIAGNOSIS — E1122 Type 2 diabetes mellitus with diabetic chronic kidney disease: Secondary | ICD-10-CM | POA: Diagnosis not present

## 2023-01-14 DIAGNOSIS — I129 Hypertensive chronic kidney disease with stage 1 through stage 4 chronic kidney disease, or unspecified chronic kidney disease: Secondary | ICD-10-CM | POA: Diagnosis not present

## 2023-01-20 DIAGNOSIS — N390 Urinary tract infection, site not specified: Secondary | ICD-10-CM | POA: Diagnosis not present

## 2023-01-20 DIAGNOSIS — N3946 Mixed incontinence: Secondary | ICD-10-CM | POA: Diagnosis not present

## 2023-01-20 DIAGNOSIS — R339 Retention of urine, unspecified: Secondary | ICD-10-CM | POA: Diagnosis not present

## 2023-03-10 DIAGNOSIS — G4733 Obstructive sleep apnea (adult) (pediatric): Secondary | ICD-10-CM | POA: Diagnosis not present

## 2023-04-09 DIAGNOSIS — G4733 Obstructive sleep apnea (adult) (pediatric): Secondary | ICD-10-CM | POA: Diagnosis not present

## 2023-04-17 DIAGNOSIS — L820 Inflamed seborrheic keratosis: Secondary | ICD-10-CM | POA: Diagnosis not present

## 2023-04-17 DIAGNOSIS — D485 Neoplasm of uncertain behavior of skin: Secondary | ICD-10-CM | POA: Diagnosis not present

## 2023-04-19 ENCOUNTER — Other Ambulatory Visit: Payer: Self-pay | Admitting: Cardiovascular Disease

## 2023-05-03 ENCOUNTER — Other Ambulatory Visit: Payer: Self-pay | Admitting: Cardiovascular Disease

## 2023-05-09 DIAGNOSIS — N1330 Unspecified hydronephrosis: Secondary | ICD-10-CM | POA: Diagnosis not present

## 2023-05-09 DIAGNOSIS — G4733 Obstructive sleep apnea (adult) (pediatric): Secondary | ICD-10-CM | POA: Diagnosis not present

## 2023-05-09 DIAGNOSIS — Z951 Presence of aortocoronary bypass graft: Secondary | ICD-10-CM | POA: Diagnosis not present

## 2023-05-09 DIAGNOSIS — I517 Cardiomegaly: Secondary | ICD-10-CM | POA: Diagnosis not present

## 2023-05-09 DIAGNOSIS — R0781 Pleurodynia: Secondary | ICD-10-CM | POA: Diagnosis not present

## 2023-05-09 DIAGNOSIS — J90 Pleural effusion, not elsewhere classified: Secondary | ICD-10-CM | POA: Diagnosis not present

## 2023-05-14 DIAGNOSIS — N184 Chronic kidney disease, stage 4 (severe): Secondary | ICD-10-CM | POA: Diagnosis not present

## 2023-05-15 ENCOUNTER — Other Ambulatory Visit (HOSPITAL_COMMUNITY): Payer: Self-pay

## 2023-05-19 DIAGNOSIS — E669 Obesity, unspecified: Secondary | ICD-10-CM | POA: Diagnosis not present

## 2023-05-19 DIAGNOSIS — E1122 Type 2 diabetes mellitus with diabetic chronic kidney disease: Secondary | ICD-10-CM | POA: Diagnosis not present

## 2023-05-19 DIAGNOSIS — N184 Chronic kidney disease, stage 4 (severe): Secondary | ICD-10-CM | POA: Diagnosis not present

## 2023-05-19 DIAGNOSIS — I129 Hypertensive chronic kidney disease with stage 1 through stage 4 chronic kidney disease, or unspecified chronic kidney disease: Secondary | ICD-10-CM | POA: Diagnosis not present

## 2023-05-20 DIAGNOSIS — N952 Postmenopausal atrophic vaginitis: Secondary | ICD-10-CM | POA: Diagnosis not present

## 2023-05-20 DIAGNOSIS — R339 Retention of urine, unspecified: Secondary | ICD-10-CM | POA: Diagnosis not present

## 2023-05-20 DIAGNOSIS — N3946 Mixed incontinence: Secondary | ICD-10-CM | POA: Diagnosis not present

## 2023-05-20 DIAGNOSIS — N390 Urinary tract infection, site not specified: Secondary | ICD-10-CM | POA: Diagnosis not present

## 2023-07-02 DIAGNOSIS — L820 Inflamed seborrheic keratosis: Secondary | ICD-10-CM | POA: Diagnosis not present

## 2023-09-08 DIAGNOSIS — G4733 Obstructive sleep apnea (adult) (pediatric): Secondary | ICD-10-CM | POA: Diagnosis not present

## 2023-09-16 DIAGNOSIS — N184 Chronic kidney disease, stage 4 (severe): Secondary | ICD-10-CM | POA: Diagnosis not present

## 2023-09-17 DIAGNOSIS — E1122 Type 2 diabetes mellitus with diabetic chronic kidney disease: Secondary | ICD-10-CM | POA: Diagnosis not present

## 2023-09-17 DIAGNOSIS — Z87891 Personal history of nicotine dependence: Secondary | ICD-10-CM | POA: Diagnosis not present

## 2023-09-17 DIAGNOSIS — N1832 Chronic kidney disease, stage 3b: Secondary | ICD-10-CM | POA: Diagnosis not present

## 2023-09-17 DIAGNOSIS — E669 Obesity, unspecified: Secondary | ICD-10-CM | POA: Diagnosis not present

## 2023-09-17 DIAGNOSIS — Z7984 Long term (current) use of oral hypoglycemic drugs: Secondary | ICD-10-CM | POA: Diagnosis not present

## 2023-09-17 DIAGNOSIS — I129 Hypertensive chronic kidney disease with stage 1 through stage 4 chronic kidney disease, or unspecified chronic kidney disease: Secondary | ICD-10-CM | POA: Diagnosis not present

## 2023-09-18 DIAGNOSIS — R21 Rash and other nonspecific skin eruption: Secondary | ICD-10-CM | POA: Diagnosis not present

## 2023-09-22 DIAGNOSIS — N390 Urinary tract infection, site not specified: Secondary | ICD-10-CM | POA: Diagnosis not present

## 2023-09-22 DIAGNOSIS — N3946 Mixed incontinence: Secondary | ICD-10-CM | POA: Diagnosis not present

## 2023-09-22 DIAGNOSIS — R339 Retention of urine, unspecified: Secondary | ICD-10-CM | POA: Diagnosis not present

## 2023-09-22 DIAGNOSIS — N952 Postmenopausal atrophic vaginitis: Secondary | ICD-10-CM | POA: Diagnosis not present

## 2023-10-23 DIAGNOSIS — L820 Inflamed seborrheic keratosis: Secondary | ICD-10-CM | POA: Diagnosis not present

## 2023-11-07 ENCOUNTER — Ambulatory Visit: Attending: Cardiovascular Disease | Admitting: Cardiovascular Disease

## 2023-11-07 VITALS — BP 130/62 | HR 66 | Ht 63.0 in | Wt 212.0 lb

## 2023-11-07 DIAGNOSIS — I480 Paroxysmal atrial fibrillation: Secondary | ICD-10-CM

## 2023-11-07 DIAGNOSIS — I5032 Chronic diastolic (congestive) heart failure: Secondary | ICD-10-CM | POA: Diagnosis not present

## 2023-11-07 DIAGNOSIS — I2510 Atherosclerotic heart disease of native coronary artery without angina pectoris: Secondary | ICD-10-CM | POA: Diagnosis not present

## 2023-11-07 NOTE — Progress Notes (Signed)
 Chief Complaint  Patient presents with   Follow-up    CAD, PAF   History of Present Illness: 78 yo female with history of chronic diastolic CHF, DM, HTN, sleep apnea, paroxysmal atrial fibrillation and CAD s/p CABG who is here today for cardiac follow up. She was admitted to [facility] in November 2018 with a NSTEMI and found to have severe three vessel CAD. She underwent 3 V CABG on 02/24/17 (LIMA to LAD, SVG to PDA, SVG to OM1). She had post op atrial fib and converted to sinus on amiodarone  prior to discharge. Several episodes of syncope post discharge and she was felt to be hypovolemic. Her Norvasc  and Lasix  were stopped and Coreg  dosage was reduced. Cardiac event monitor in 2018 was normal. Symptoms resolved. Echo 02/17/17 with LVEF=55-60%, mild MR. Cardiac monitor November 2019 with no evidence of atrial fibrillation. She had a stroke in December 2019.  TEE 03/06/18 with no intracardiac thrombus and no PFO or ASD. Loop recorder was placed.   She is here today for follow up. The patient denies any chest pain, dyspnea, palpitations, lower extremity edema, orthopnea, PND, dizziness, near syncope or syncope.   Primary Care Physician: Ina Marcellus RAMAN, MD  Past Medical History:  Diagnosis Date   CAD in native artery    a. s/p CABGx3 in 01/2017.   Chronic diastolic CHF (congestive heart failure) (HCC)    Diabetes mellitus (HCC)    Diabetic neuropathy (HCC)    History of kidney stones    history of stones   Hypertension    Hypothyroidism    NSTEMI (non-ST elevated myocardial infarction) (HCC)    Orthostatic hypotension    Postoperative atrial fibrillation (HCC)    a. after CABG 01/2017.   TIA (transient ischemic attack)     Past Surgical History:  Procedure Laterality Date   ABDOMINAL HYSTERECTOMY     CORONARY ARTERY BYPASS GRAFT N/A 02/24/2017   Procedure: CORONARY ARTERY BYPASS GRAFTING (CABG) USING LEFT INTERNAL MAMMARY ARTERY TO LAD AND ENDOSCOPICALLY HARVESTED GREATER SAPHENOUS VEIN  TO PDA AND TO OM1.;  Surgeon: Army Dallas NOVAK, MD;  Location: [facility] OR;  Service: Open Heart Surgery;  Laterality: N/A;   ENDOVEIN HARVEST OF GREATER SAPHENOUS VEIN Right 02/24/2017   Procedure: ENDOVEIN HARVEST OF GREATER SAPHENOUS VEIN;  Surgeon: Army Dallas NOVAK, MD;  Location: [facility] OR;  Service: Open Heart Surgery;  Laterality: Right;   LAPAROSCOPIC GASTRIC BANDING     LEFT HEART CATH AND CORONARY ANGIOGRAPHY N/A 02/18/2017   Procedure: LEFT HEART CATH AND CORONARY ANGIOGRAPHY;  Surgeon: Anner Alm ORN, MD;  Location: [facility] INVASIVE CV LAB;  Service: Cardiovascular;  Laterality: N/A;   LOOP RECORDER INSERTION N/A 03/06/2018   Procedure: LOOP RECORDER INSERTION;  Surgeon: Inocencio Soyla Lunger, MD;  Location: [facility] INVASIVE CV LAB;  Service: Cardiovascular;  Laterality: N/A;   TEE WITHOUT CARDIOVERSION N/A 02/24/2017   Procedure: TRANSESOPHAGEAL ECHOCARDIOGRAM (TEE);  Surgeon: Army Dallas NOVAK, MD;  Location: [facility] OR;  Service: Open Heart Surgery;  Laterality: N/A;   TEE WITHOUT CARDIOVERSION N/A 03/06/2018   Procedure: TRANSESOPHAGEAL ECHOCARDIOGRAM (TEE);  Surgeon: Maranda Leim DEL, MD;  Location: [facility] ENDOSCOPY;  Service: Cardiovascular;  Laterality: N/A;  loop    Current Outpatient Medications  Medication Sig Dispense Refill   acetaminophen  (TYLENOL ) 325 MG tablet Take 2 tablets (650 mg total) by mouth every 6 (six) hours as needed for mild pain.     amLODipine  (NORVASC ) 10 MG tablet TAKE 1 TABLET BY MOUTH EVERY DAY 90 tablet  3   aspirin  81 MG EC tablet Take by mouth.     atorvastatin  (LIPITOR ) 40 MG tablet Take 1 tablet (40 mg total) by mouth daily. 90 tablet 3   bethanechol (URECHOLINE) 25 MG tablet Take 25 mg by mouth 2 (two) times daily.     carvedilol  (COREG ) 12.5 MG tablet Take 1 tablet (12.5 mg total) by mouth 2 (two) times daily. 60 tablet 11   ELIQUIS  5 MG TABS tablet TAKE 1 TABLET BY MOUTH TWICE A DAY 60 tablet 6   glipiZIDE  (GLUCOTROL ) 5 MG tablet Take 5 mg by mouth 2 (two) times  daily before a meal.     levothyroxine  (SYNTHROID ) 100 MCG tablet Take 100 mcg by mouth daily.     lisinopril  (PRINIVIL ,ZESTRIL ) 5 MG tablet Take 1 tablet (5 mg total) by mouth daily. 30 tablet 3   Multiple Vitamin (MULTIVITAMIN WITH MINERALS) TABS tablet Take 1 tablet by mouth daily.     nitrofurantoin (MACRODANTIN) 100 MG capsule Take 100 mg by mouth.     tamsulosin (FLOMAX) 0.4 MG CAPS capsule Take 0.4 mg by mouth daily.     Baclofen 5 MG TABS baclofen 5 mg tablet  1 2 TABLET EVERY 8 HOURS AS NEEDED FOR SPASMS (Patient not taking: Reported on 11/07/2023)     Carboxymethylcellul-Glycerin 1-0.25 % SOLN Place 1 drop into both eyes as needed. (Patient not taking: Reported on 11/07/2023)     cholecalciferol (VITAMIN D) 1000 units tablet Take 1,000 Units by mouth daily. (Patient not taking: Reported on 11/07/2023)     co-enzyme Q-10 30 MG capsule Take by mouth. (Patient not taking: Reported on 11/07/2023)     cyclobenzaprine (FLEXERIL) 5 MG tablet Take 1 tablet (5 mg total) by mouth 3 (three) times daily as needed for muscle spasms. (Patient not taking: Reported on 11/07/2023) 30 tablet 2   Glucosamine-Chondroit-Vit C-Mn (GLUCOSAMINE CHONDR 500 COMPLEX PO) Take 1 tablet by mouth 2 (two) times daily. (Patient not taking: Reported on 11/07/2023)     Krill Oil 1000 MG CAPS Take 1,000 mg by mouth daily. (Patient not taking: Reported on 11/07/2023)     meclizine  (ANTIVERT ) 25 MG tablet Take 1 tablet (25 mg total) by mouth 3 (three) times daily as needed for dizziness. (Patient not taking: Reported on 11/07/2023) 30 tablet 1   methocarbamol  (ROBAXIN ) 500 MG tablet Take by mouth. (Patient not taking: Reported on 11/07/2023)     ondansetron  (ZOFRAN ) 8 MG tablet  (Patient not taking: Reported on 11/07/2023)     oxyCODONE  (OXY IR/ROXICODONE ) 5 MG immediate release tablet  (Patient not taking: Reported on 11/07/2023)     phenazopyridine (PYRIDIUM) 100 MG tablet  (Patient not taking: Reported on 11/07/2023)     traMADol  (ULTRAM ) 50 MG  tablet  (Patient not taking: Reported on 11/07/2023)     No current facility-administered medications for this visit.    Allergies  Allergen Reactions   Sulfa Antibiotics Nausea And Vomiting   Amoxicillin-Pot Clavulanate     Other reaction(s): Other (See Comments) Pt reports caused a metallic taste   Shellfish Allergy Other (See Comments)    UNKNOWN REACTION.  Allergy found on ALLERGY TEST.   Aggrenox [Aspirin -Dipyridamole Er] Other (See Comments)    Redness in eyes, takes aspirin  325 at home at baseline    Social History   Socioeconomic History   Marital status: Married    Spouse name: Not on file   Number of children: Not on file   Years of education: Not on  file   Highest education level: Not on file  Occupational History   Not on file  Tobacco Use   Smoking status: Former    Current packs/day: 0.00    Types: Cigarettes    Start date: 75    Quit date: 19    Years since quitting: 50.6   Smokeless tobacco: Never   Tobacco comments:    Former smoker 03/05/2021  Vaping Use   Vaping status: Never Used  Substance and Sexual Activity   Alcohol use: No   Drug use: No   Sexual activity: Not on file  Other Topics Concern   Not on file  Social History Narrative   Not on file   Social Drivers of Health   Financial Resource Strain: Not on file  Food Insecurity: Not on file  Transportation Needs: Not on file  Physical Activity: Not on file  Stress: Not on file  Social Connections: Not on file  Intimate Partner Violence: Not on file    Family History  Problem Relation Age of Onset   Heart disease Mother 65       CABG   CVA Father     Review of Systems:  As stated in the HPI and otherwise negative.   BP 130/62   Pulse 66   Ht 5' 3 (1.6 m)   Wt 212 lb (96.2 kg)   LMP  (LMP Unknown)   SpO2 99%   BMI 37.55 kg/m   Physical Examination: General: Well developed, well nourished, NAD  HEENT: OP clear, mucus membranes moist  SKIN: warm, dry. No  rashes. Neuro: No focal deficits  Musculoskeletal: Muscle strength 5/5 all ext  Psychiatric: Mood and affect normal  Neck: No JVD, no carotid bruits, no thyromegaly, no lymphadenopathy.  Lungs:Clear bilaterally, no wheezes, rhonci, crackles Cardiovascular: Regular rate and rhythm. No murmurs, gallops or rubs. Abdomen:Soft. Bowel sounds present. Non-tender.  Extremities: No lower extremity edema. Pulses are 2 + in the bilateral DP/PT.  EKG:  EKG is ordered today. The ekg ordered today demonstrates  EKG Interpretation Date/Time:  Friday November 07 2023 14:50:46 EDT Ventricular Rate:  67 PR Interval:  154 QRS Duration:  80 QT Interval:  382 QTC Calculation: 403 R Axis:   -4  Text Interpretation: Normal sinus rhythm Non-specific T wave abnormality Confirmed by Verlin Bruckner [phone]) on 11/07/2023 2:53:29 PM    Recent Labs: No results found for requested labs within last 365 days.   Lipid Panel    Component Value Date/Time   CHOL 167 03/03/2018 1046   TRIG 157 (H) 03/03/2018 1046   HDL 37 (L) 03/03/2018 1046   CHOLHDL 4.5 03/03/2018 1046   VLDL 31 03/03/2018 1046   LDLCALC 99 03/03/2018 1046     Wt Readings from Last 3 Encounters:  11/07/23 212 lb (96.2 kg)  09/30/22 195 lb (88.5 kg)  02/15/22 203 lb (92.1 kg)     Assessment and Plan:   1. CAD s/p CABG without angina: She had CABG in November 2018.  No chest pain. Will continue ASA, statin and beta blocker. Lipids followed in primary care and well controlled per pt. I cannot see a LDL but total cholesterol 865.   2. Paroxysmal atrial fibrillation: No palpitations. Continue Coreg  and Eliquis .    3. Chronic diastolic CHF: No volume overload on exam.  She does not take a diuretic.   Labs/ tests ordered today include:   Orders Placed This Encounter  Procedures   EKG 12-Lead   Disposition:  F/U with me in 12  months  Signed, Lonni Cash, MD 11/07/2023 3:57 PM    [facility]  HeartCare [address] [place], [place], [place]  [address] Phone: [phone]; Fax: [phone]

## 2023-11-07 NOTE — Patient Instructions (Signed)
 Medication Instructions:  No changes *If you need a refill on your cardiac medications before your next appointment, please call your pharmacy*  Lab Work: none  Testing/Procedures: none  Follow-Up: At [facility], you and your health needs are our priority.  As part of our continuing mission to provide you with exceptional heart care, our providers are all part of one team.  This team includes your primary Cardiologist (physician) and Advanced Practice Providers or APPs (Physician Assistants and Nurse Practitioners) who all work together to provide you with the care you need, when you need it.  Your next appointment:   12 month(s)  Provider:   Lonni Cash, MD    We recommend signing up for the patient portal called MyChart.  Sign up information is provided on this After Visit Summary.  MyChart is used to connect with patients for Virtual Visits (Telemedicine).  Patients are able to view lab/test results, encounter notes, upcoming appointments, etc.  Non-urgent messages can be sent to your provider as well.   To learn more about what you can do with MyChart, go to [email or web address].

## 2023-11-10 ENCOUNTER — Ambulatory Visit: Attending: Cardiology | Admitting: Cardiology

## 2023-11-10 ENCOUNTER — Encounter: Payer: Self-pay | Admitting: Cardiology

## 2023-11-10 VITALS — BP 114/60 | HR 66 | Ht 63.0 in | Wt 208.2 lb

## 2023-11-10 DIAGNOSIS — I480 Paroxysmal atrial fibrillation: Secondary | ICD-10-CM

## 2023-11-10 DIAGNOSIS — D6869 Other thrombophilia: Secondary | ICD-10-CM | POA: Diagnosis not present

## 2023-11-10 DIAGNOSIS — I2510 Atherosclerotic heart disease of native coronary artery without angina pectoris: Secondary | ICD-10-CM | POA: Diagnosis not present

## 2023-11-10 DIAGNOSIS — G4733 Obstructive sleep apnea (adult) (pediatric): Secondary | ICD-10-CM | POA: Diagnosis not present

## 2023-11-10 DIAGNOSIS — I10 Essential (primary) hypertension: Secondary | ICD-10-CM | POA: Diagnosis not present

## 2023-11-10 NOTE — Progress Notes (Signed)
  Electrophysiology Office Note:   Date:  11/10/2023  ID:  Theresa Ray, DOB [birth year], MRN [id number]  Primary Cardiologist: Theresa Cash, MD Primary Heart Failure: None Electrophysiologist: Theresa Vannatter Gladis Norton, MD      History of Present Illness:   Theresa Ray is a 78 y.o. female with h/o atrial fibrillation, coronary artery disease post CABG, sleep apnea, diastolic heart failure, diabetes, hypertension, CVA seen today for routine electrophysiology followup.   Since last being seen in our clinic the patient reports doing well.  Over the last year, she has no acute complaints.  Over the last year, she is unaware of any episodes of atrial fibrillation.  She has been happy with her control.  she denies chest pain, palpitations, dyspnea, PND, orthopnea, nausea, vomiting, dizziness, syncope, edema, weight gain, or early satiety.   Review of systems complete and found to be negative unless listed in HPI.   EP Information / Studies Reviewed:    EKG is ordered today. Personal review as below.  EKG Interpretation Date/Time:  Monday November 10 2023 16:29:25 EDT Ventricular Rate:  66 PR Interval:  156 QRS Duration:  86 QT Interval:  350 QTC Calculation: 366 R Axis:   -37  Text Interpretation: Normal sinus rhythm Left axis deviation Low voltage QRS When compared with ECG of 07-Nov-2023 14:50, No significant change since last tracing Confirmed by Theresa Ray ([id number]) on 11/10/2023 4:42:35 PM    Risk Assessment/Calculations:    CHA2DS2-VASc Score = 8   This indicates a 10.8% annual risk of stroke. The patient's score is based upon: CHF History: 0 HTN History: 1 Diabetes History: 1 Stroke History: 2 Vascular Disease History: 1 Age Score: 2 Gender Score: 1            Physical Exam:   VS:  BP 114/60   Pulse 66   Ht 5' 3 (1.6 m)   Wt 208 lb 3.2 oz (94.4 kg)   LMP  (LMP Unknown)   SpO2 95%   BMI 36.88 kg/m    Wt Readings from Last 3 Encounters:  11/10/23 208 lb 3.2  oz (94.4 kg)  11/07/23 212 lb (96.2 kg)  09/30/22 195 lb (88.5 kg)     GEN: Well nourished, well developed in no acute distress NECK: No JVD; No carotid bruits CARDIAC: Regular rate and rhythm, no murmurs, rubs, gallops RESPIRATORY:  Clear to auscultation without rales, wheezing or rhonchi  ABDOMEN: Soft, non-tender, non-distended EXTREMITIES:  No edema; No deformity   ASSESSMENT AND PLAN:    1.  Paroxysmal atrial fibrillation: On Eliquis  and carvedilol .  Well rate controlled.  Minimal episodes.  Continue with current management.  He has been doing well without further episodes of atrial fibrillation.  She is unaware of any episodes recently.  She Torrian Canion follow-up as needed.  If she has further episodes requiring therapy, would be happy to see her back.  2.  Secondary hypercoagulable state: On Eliquis   3.  Coronary artery disease: Post CABG  4.  Obstructive sleep apnea: CPAP compliance encouraged  5.  Hypertension: Well-controlled.  Follow up with Dr. Norton as needed   Signed, Theresa Ray Gladis Norton, MD

## 2023-11-10 NOTE — Patient Instructions (Signed)
 Medication Instructions:  Your physician recommends that you continue on your current medications as directed. Please refer to the Current Medication list given to you today.  *If you need a refill on your cardiac medications before your next appointment, please call your pharmacy*  Lab Work: None ordered.  You may go to any [facility] Location for your lab work:  [facility] - [address] ([facility]) - [address] [phone] [address]  [place] - [address]   [place]  - [address]   [place] - [address] - [address] [employer]  - [address] - [address] ([facility]   If you have labs (blood work) drawn today and your tests are completely normal, you will receive your results only by: [employer] (if you have MyChart)  If you have any lab test that is abnormal or we need to change your treatment, we will call you or send a MyChart message to review the results.  Testing/Procedures: None ordered.  Follow-Up: At [facility], you and your health needs are our priority.  As part of our continuing mission to provide you with exceptional heart care, we have created designated Provider Care Teams.  These Care Teams include your primary Cardiologist (physician) and Advanced Practice Providers (APPs -  Physician Assistants and Nurse Practitioners) who all work together to provide you with the care you need, when you need it.  We recommend signing up for the patient portal called MyChart.  Sign up information is provided on this After Visit Summary.  MyChart is used to connect with patients for Virtual Visits (Telemedicine).  Patients are able to view lab/test results, encounter notes, upcoming appointments, etc.  Non-urgent messages can be sent to your provider as well.   To learn more about what you can do with MyChart, go to  [email or web address].    Your next appointment:   As needed  The format for your next appointment:   In Person  Provider:   Soyla Norton, MD or one of the following Advanced Practice Providers on your designated Care Team:   Charlies Arthur, [place] Ozell Jodie Passey, [place] Leotis Barrack, NP  Note: Remote monitoring is used to monitor your Pacemaker/ ICD from home. This monitoring reduces the number of office visits required to check your device to one time per year. It allows us  to keep an eye on the functioning of your device to ensure it is working properly.

## 2023-12-08 DIAGNOSIS — G4733 Obstructive sleep apnea (adult) (pediatric): Secondary | ICD-10-CM | POA: Diagnosis not present

## 2023-12-23 DIAGNOSIS — M25561 Pain in right knee: Secondary | ICD-10-CM | POA: Diagnosis not present

## 2023-12-23 DIAGNOSIS — M170 Bilateral primary osteoarthritis of knee: Secondary | ICD-10-CM | POA: Diagnosis not present

## 2024-01-02 DIAGNOSIS — M25561 Pain in right knee: Secondary | ICD-10-CM | POA: Diagnosis not present

## 2024-01-02 DIAGNOSIS — M170 Bilateral primary osteoarthritis of knee: Secondary | ICD-10-CM | POA: Diagnosis not present

## 2024-01-03 DIAGNOSIS — R9431 Abnormal electrocardiogram [ECG] [EKG]: Secondary | ICD-10-CM | POA: Diagnosis not present

## 2024-01-03 DIAGNOSIS — R739 Hyperglycemia, unspecified: Secondary | ICD-10-CM | POA: Diagnosis not present

## 2024-01-03 DIAGNOSIS — N3000 Acute cystitis without hematuria: Secondary | ICD-10-CM | POA: Diagnosis not present

## 2024-01-03 DIAGNOSIS — R531 Weakness: Secondary | ICD-10-CM | POA: Diagnosis not present

## 2024-01-03 DIAGNOSIS — E1165 Type 2 diabetes mellitus with hyperglycemia: Secondary | ICD-10-CM | POA: Diagnosis not present

## 2024-01-03 DIAGNOSIS — I10 Essential (primary) hypertension: Secondary | ICD-10-CM | POA: Diagnosis not present

## 2024-01-06 DIAGNOSIS — L820 Inflamed seborrheic keratosis: Secondary | ICD-10-CM | POA: Diagnosis not present

## 2024-01-19 DIAGNOSIS — I129 Hypertensive chronic kidney disease with stage 1 through stage 4 chronic kidney disease, or unspecified chronic kidney disease: Secondary | ICD-10-CM | POA: Diagnosis not present

## 2024-01-19 DIAGNOSIS — E669 Obesity, unspecified: Secondary | ICD-10-CM | POA: Diagnosis not present

## 2024-01-19 DIAGNOSIS — E1122 Type 2 diabetes mellitus with diabetic chronic kidney disease: Secondary | ICD-10-CM | POA: Diagnosis not present

## 2024-01-19 DIAGNOSIS — N1832 Chronic kidney disease, stage 3b: Secondary | ICD-10-CM | POA: Diagnosis not present

## 2024-01-28 DIAGNOSIS — N3946 Mixed incontinence: Secondary | ICD-10-CM | POA: Diagnosis not present

## 2024-01-28 DIAGNOSIS — R339 Retention of urine, unspecified: Secondary | ICD-10-CM | POA: Diagnosis not present

## 2024-01-28 DIAGNOSIS — N390 Urinary tract infection, site not specified: Secondary | ICD-10-CM | POA: Diagnosis not present

## 2024-01-28 DIAGNOSIS — N952 Postmenopausal atrophic vaginitis: Secondary | ICD-10-CM | POA: Diagnosis not present

## 2024-03-08 DIAGNOSIS — G4733 Obstructive sleep apnea (adult) (pediatric): Secondary | ICD-10-CM | POA: Diagnosis not present

## 2024-04-21 NOTE — Progress Notes (Signed)
 Theresa Ray                                          MRN: [id number]   04/21/2024   The [facility] Quality Team Specialist reviewed this patient medical record for the purposes of chart review for care gap closure. The following were reviewed: chart review for care gap closure-kidney health evaluation for diabetes:eGFR  and uACR.    [facility] Quality Team

## 2024-04-27 ENCOUNTER — Other Ambulatory Visit: Payer: Self-pay | Admitting: Cardiovascular Disease
